# Patient Record
Sex: Male | Born: 1965 | Race: White | Hispanic: Yes | Marital: Single | Smoking: Former smoker
Health system: Southern US, Community
[De-identification: ages and names within clinical notes are randomized; demographics above are authoritative.]

## PROBLEM LIST (undated history)

## (undated) DIAGNOSIS — B351 Tinea unguium: Secondary | ICD-10-CM

## (undated) DIAGNOSIS — G629 Polyneuropathy, unspecified: Secondary | ICD-10-CM

## (undated) DIAGNOSIS — IMO0002 Reserved for concepts with insufficient information to code with codable children: Secondary | ICD-10-CM

## (undated) DIAGNOSIS — E1129 Type 2 diabetes mellitus with other diabetic kidney complication: Secondary | ICD-10-CM

## (undated) DIAGNOSIS — I10 Essential (primary) hypertension: Secondary | ICD-10-CM

## (undated) DIAGNOSIS — E1165 Type 2 diabetes mellitus with hyperglycemia: Secondary | ICD-10-CM

## (undated) DIAGNOSIS — E785 Hyperlipidemia, unspecified: Secondary | ICD-10-CM

## (undated) DIAGNOSIS — E1140 Type 2 diabetes mellitus with diabetic neuropathy, unspecified: Secondary | ICD-10-CM

## (undated) DIAGNOSIS — R809 Proteinuria, unspecified: Secondary | ICD-10-CM

## (undated) HISTORY — DX: Proteinuria, unspecified: R80.9

## (undated) HISTORY — DX: Tinea unguium: B35.1

## (undated) HISTORY — PX: APPENDECTOMY: SHX54

## (undated) HISTORY — DX: Polyneuropathy, unspecified: G62.9

## (undated) HISTORY — DX: Type 2 diabetes mellitus with other diabetic kidney complication: E11.29

## (undated) HISTORY — DX: Type 2 diabetes mellitus with diabetic neuropathy, unspecified: E11.40

## (undated) HISTORY — DX: Type 2 diabetes mellitus with hyperglycemia: E11.65

## (undated) HISTORY — DX: Hyperlipidemia, unspecified: E78.5

## (undated) HISTORY — DX: Essential (primary) hypertension: I10

## (undated) HISTORY — DX: Reserved for concepts with insufficient information to code with codable children: IMO0002

---

## 2002-10-28 ENCOUNTER — Emergency Department (HOSPITAL_COMMUNITY): Admission: EM | Admit: 2002-10-28 | Discharge: 2002-10-28 | Payer: Self-pay | Admitting: Emergency Medicine

## 2002-10-28 ENCOUNTER — Encounter: Payer: Self-pay | Admitting: Emergency Medicine

## 2004-06-13 ENCOUNTER — Encounter: Admission: RE | Admit: 2004-06-13 | Discharge: 2004-06-13 | Payer: Self-pay | Admitting: Gastroenterology

## 2006-08-14 ENCOUNTER — Emergency Department (HOSPITAL_COMMUNITY): Admission: EM | Admit: 2006-08-14 | Discharge: 2006-08-14 | Payer: Self-pay | Admitting: Emergency Medicine

## 2007-05-24 ENCOUNTER — Ambulatory Visit: Payer: Self-pay | Admitting: Family Medicine

## 2007-05-24 ENCOUNTER — Inpatient Hospital Stay (HOSPITAL_COMMUNITY): Admission: EM | Admit: 2007-05-24 | Discharge: 2007-05-30 | Payer: Self-pay | Admitting: Emergency Medicine

## 2010-11-19 NOTE — H&P (Signed)
Willie Jordan, Willie Jordan               ACCOUNT NO.:  [id number]   MEDICAL RECORD NO.:  [id number]          PATIENT TYPE:  INP   LOCATION:  5708                         FACILITY:  [facility]   PHYSICIAN:  Sherrell Puller, MDDATE OF BIRTH:  Feb 06, 1966   DATE OF ADMISSION:  05/24/2007  DATE OF DISCHARGE:                              HISTORY & PHYSICAL   ATTENDING PHYSICIA:  Dr. Talbert Cage   CHIEF COMPLAINT:  Abdominal pain.   HISTORY OF PRESENT ILLNESS:  The patient is a 45 year old male with a  history of uncontrolled diabetes mellitus who presents complaining of 1  day history of abdominal pain.  I saw her last night at approximately  2100.  It is associated with 1 episode of emesis.  The patient was seen  by [facility] this morning and found to have high glucoses and thought  to have acute pancreatitis.  Pain is left-sided and radiates to the  back.  It was 6 out of 10 at its worse.  The patient denies fever,  chills, constipation, diarrhea, dysuria.  The patient does endorse  anorexia as well.  He denies rash, alcohol use, and weight loss.  He IV  pain medications in the emergency room and felt significantly better.  The patient states that he is hungry when he seen and evaluated.   PAST MEDICAL HISTORY:  1. Diabetes mellitus type 2.  2. Questionable hyperlipidemia.   ALLERGIES:  ASPIRIN, which causes pruritus.   MEDICATIONS:  Insulin of unknown type 15 units daily.  The patient used  to take metformin.   FAMILY HISTORY:  Significant for coronary artery disease, hypertension,  diabetes, hyperlipidemia.   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  He does not endorse tobacco abuse of 6 to 7  cigarettes per day, but denies alcohol or drug use.   REVIEW OF SYSTEMS:  As per HPI.  Negative for polydipsia or polyuria.   PHYSICAL EXAMINATION:  VITALS:  Temperature 98.0, blood pressure 113/70,  heart rate 107,  respiratory rate 20, oxygen saturation 97% on room air.  GENERAL:  No apparent distress, awake, alert and oriented.  HEENT:  Dry mucous membranes.  No erythema or exudates in the  oropharynx.  No lymphadenopathy.  No scleral icterus.  Pupils are equal,  round, and reactive to light and accommodation with extraocular  movements intact.  CARDIOVASCULAR:  Tachycardic but with regular rhythm.  No murmurs, rubs,  or gallops.  RESPIRATORY:  Lungs are clear to auscultation bilaterally with normal  work of breathing.  No wheezes, rales, rhonchi.  ABDOMEN:  Soft, tenderness to palpation in the left upper  quadrant/epigastric region as well as, left mid back posterior.  No  rebound or guarding.  Positive bowel sounds.  No organomegaly.  EXTREMITIES:  No clubbing, cyanosis, or edema.  NEURO:  Cranial nerves II-XII grossly intact.  EXTREMITIES:  5/5 in bilateral lower extremities and bilateral upper  extremities.  Sensation intact.  SKIN:  No jaundice.   LABS:  Lipase 150.  Urinalysis - specific  gravity 1.038, greater than  1000 glucose, trace hemoglobin, moderate bilateral, greater than 80  ketones, total protein 100.  Alcohol level less than 5.  White blood  cell count 22.2, hemoglobin 16, platelets 246,000, absolute neutrophil  count 19.1.  AST 72, ALT 37, alk phos 80, total bilirubin 4.6, direct  bilirubin 1.4.  A pH 7.245, pO2 31.6, bicarb 13.7.  Sodium 132,  potassium 4.9, chloride 108, bicarb 36.7, BUN 9, creatinine 0.9, glucose  339.  Please note that the patient's CBC was a lipemic blood sample.   CT scan of the abdomen and pelvis showed acute pancreatitis in the tail  of the pancreas.  No pseudocyst or pancreatic calcifications, mild fatty  liver.  No mass or bowel obstruction.   ASSESSMENT AND PLAN:  The patient is a 45 year old male with acute  pancreatitis, as well as diabetes mellitus.  1. Acute pancreatitis:  Most likely etiology is hypertriglyceridemia,      given the lipemic  blood sample.  However, given the increased      bilirubin, possibly related to cholelithiasis.  The patient denies      alcohol use and has a negative alcohol level here.  So alcoholic      pancreatitis is unlikely.  The patient's vital signs are stable and      his risks and criteria are at most 2, therefore, he is low risk.      There is no fluid or pseudocyst on CT.  We will place the patient      on bowel rest for now with intravenous morphine for pain control.      Will check a fasting lipid panel in the morning, as well as a      comprehensive metabolic panel and hemoglobin A1c.  Will also get a      right upper quadrant ultrasound, in the morning, to evaluate for      gallstones, although there was no comment of gallstones on the CT      scan.  2. Diabetes mellitus type 2:  The patient's sugars have been in the      300s.  Will check a hemoglobin A1c and place on a sliding scale of      insulin for now, as well as 15 units of Lantus subcutaneously      q.h.s.  3. Elevated white blood cell count:  Likely secondary to pancreatitis.      The patient is not febrile, however, his last temperature in the      emergency room was 99.5.  Therefore, we will get blood cultures x2      and monitor.  4. Feeding/nutrition/gastrointestinal:  The patient is n.p.o. for now      and on half normal saline at 100 mL per hour.   DISPOSITION:  Pending clinical improvement and tolerating p.o.      Sherrell Puller, MD  Electronically Signed     TCB/MEDQ  D:  05/25/2007  T:  05/25/2007  Job:  [id number]

## 2010-11-22 NOTE — Discharge Summary (Signed)
Willie Jordan, Willie Jordan               ACCOUNT NO.:  [id number]   MEDICAL RECORD NO.:  [id number]          PATIENT TYPE:  INP   LOCATION:  5708                         FACILITY:  [facility]   PHYSICIAN:  Toomsuba A. Walker Kehr, M.D.    DATE OF BIRTH:  [birth year]   DATE OF ADMISSION:  05/24/2007  DATE OF DISCHARGE:  05/30/2007                               DISCHARGE SUMMARY   PRIMARY CARE Delight Bickle:  Althia Forts (patient goes to [facility]).   REASON FOR ADMISSION:  Abdominal pain.   DISCHARGE DIAGNOSES:  1. Acute pancreatitis secondary to hypertriglyceridemia.  2. Diabetic ketoacidosis.  3. Uncontrolled diabetes mellitus.  4. Hypertriglyceridemia.   DISCHARGE MEDICATIONS:  1. Gemfibrozil 600 mg 1 tab p.o. b.i.d.  2. 70/30 insulin 20 units in the morning and 30 units at night.  3. Metformin 500 mg p.o. b.i.d.   HOSPITAL COURSE:  The patient is a 45 year old Hispanic male who has a  history of uncontrolled diabetes mellitus who presented to the emergency  department with a 1-day history of abdominal pain.  Patient also  complained of emesis.  He had a CT scan of the abdomen and pelvis done  which was consistent with acute pancreatitis without necrosis.  1. Acute pancreatitis:  As mentioned the patient presented with      abdominal pain and had CT scan findings consistent with acute      pancreatitis.  Given a lipemic blood sample, a fasting lipid panel      was obtained which was significant for triglycerides of 526.  This      was likely the etiology of the patient's acute pancreatitis.  He      was initially n.p.o. and given morphine as needed for abdominal      pain.  Patient did have a right upper quadrant ultrasound done      which was negative for cholelithiasis.  Patient initially had an      elevated white count as well as elevated temperature, but these      resolved as did his pain over the several days of his      hospitalization.  Two days prior to discharge, patient did have  fever to 102.7.  However, this was attributed to his resolving      pancreatitis given his negative blood cultures, the normal white      blood cell count and no other obvious source of infection.  Patient      was started on gemfibrozil while in the hospital in order to      control his triglycerides in an effort to prevent further episodes      of pancreatitis.  2. Diabetic ketoacidosis:  On admission the patient was found to have      a significant metabolic acidosis and elevated blood sugars.  He      received vigorous IV fluid resuscitation and initially was started      on an insulin drip.  He had a negative lactic acid level.  His      anion gap eventually closed and patient was transitioned to  subcutaneous insulin.  Although patient's story seems more      consistent with type 2 diabetes, it is possible that his acute      pancreatitis caused him to go into diabetic ketoacidosis.  By the      day of discharge, the patient's blood glucoses, while not optimally      controlled, were less than 300.  Patient was tolerating p.o. well      and able to maintain adequate hydration via p.o. liquids.  Patient      will need continued titration of his diabetes medications in order      to reach optimum management of his diabetes.  3. Hypertriglyceridemia:  As previously mentioned, the patient was      started on gemfibrozil in order to control his triglyceride levels.   CONDITION AT THE TIME OF DISCHARGE:  Stable.   DISCHARGE LABS:  Sodium 138, potassium 3.8, glucose 221, BUN 8,  creatinine 0.65, white blood cell count 5.9, hemoglobin 12.3, platelet  count 194.  LDH 188, C-peptide 1.02, direct LDL 64, hemoglobin A1c 12.1,  triglycerides 526, HDL 30.   DISPOSITION:  The patient was discharged to home.   DISCHARGE FOLLOWUP:  The patient was instructed to follow up at [facility] on Tuesday, November 25.  As it was the weekend, patient was  instructed to call and make that appointment  himself.   FOLLOWUP ISSUES:  1. Poor glycemic control.  2. Hypertriglyceridemia.      Sherrell Puller, MD  Electronically Signed      Arty Baumgartner. Walker Kehr, M.D.  Electronically Signed    TCB/MEDQ  D:  06/01/2007  T:  06/02/2007  Job:  [id number]

## 2011-04-15 LAB — CBC
HCT: 33.7 — ABNORMAL LOW
HCT: 34.5 — ABNORMAL LOW
HCT: 36.5 — ABNORMAL LOW
HCT: 42.5
HCT: 46
Hemoglobin: 12.8 — ABNORMAL LOW
Hemoglobin: 15
Hemoglobin: 16
MCHC: 34.7
MCHC: 34.9
MCHC: 35.1
MCHC: 35.2
MCV: 81.6
MCV: 81.7
MCV: 82.1
MCV: 82.4
Platelets: 138 — ABNORMAL LOW
Platelets: 146 — ABNORMAL LOW
Platelets: 170
Platelets: 171
Platelets: 171
Platelets: 246
RBC: 4.22
RBC: 4.33
RBC: 5.17
RBC: 5.58
RDW: 13.1
RDW: 13.5
RDW: 13.6
RDW: 14.5
WBC: 11.8 — ABNORMAL HIGH
WBC: 15.3 — ABNORMAL HIGH
WBC: 22.2 — ABNORMAL HIGH
WBC: 5.8
WBC: 5.9
WBC: 8

## 2011-04-15 LAB — BASIC METABOLIC PANEL
BUN: 2 — ABNORMAL LOW
BUN: 3 — ABNORMAL LOW
BUN: 5 — ABNORMAL LOW
BUN: 5 — ABNORMAL LOW
BUN: 5 — ABNORMAL LOW
BUN: 5 — ABNORMAL LOW
CO2: 12 — ABNORMAL LOW
CO2: 20
CO2: 21
CO2: 21
CO2: 24
CO2: 30
Calcium: 8.1 — ABNORMAL LOW
Calcium: 8.3 — ABNORMAL LOW
Calcium: 8.4
Calcium: 8.4
Calcium: 8.6
Calcium: 9
Chloride: 102
Chloride: 103
Chloride: 105
Chloride: 105
Chloride: 106
Chloride: 108
Creatinine, Ser: 0.54
Creatinine, Ser: 0.65
Creatinine, Ser: 0.65
Creatinine, Ser: 0.66
Creatinine, Ser: 0.67
Creatinine, Ser: 0.67
Creatinine, Ser: 0.71
GFR calc Af Amer: >60
GFR calc Af Amer: >60
GFR calc Af Amer: >60
GFR calc Af Amer: >60
GFR calc Af Amer: >60
GFR calc Af Amer: >60
GFR calc Af Amer: >60
GFR calc non Af Amer: >60
GFR calc non Af Amer: >60
GFR calc non Af Amer: >60
GFR calc non Af Amer: >60
GFR calc non Af Amer: >60
GFR calc non Af Amer: >60
GFR calc non Af Amer: >60
GFR calc non Af Amer: >60
GFR calc non Af Amer: >60
Glucose, Bld: 115 — ABNORMAL HIGH
Glucose, Bld: 167 — ABNORMAL HIGH
Glucose, Bld: 183 — ABNORMAL HIGH
Glucose, Bld: 197 — ABNORMAL HIGH
Glucose, Bld: 203 — ABNORMAL HIGH
Glucose, Bld: 212 — ABNORMAL HIGH
Glucose, Bld: 222 — ABNORMAL HIGH
Potassium: 3 — ABNORMAL LOW
Potassium: 3.1 — ABNORMAL LOW
Potassium: 3.2 — ABNORMAL LOW
Potassium: 3.2 — ABNORMAL LOW
Potassium: 3.2 — ABNORMAL LOW
Potassium: 3.4 — ABNORMAL LOW
Potassium: 3.6
Potassium: 3.7
Potassium: 3.9
Sodium: 130 — ABNORMAL LOW
Sodium: 131 — ABNORMAL LOW
Sodium: 131 — ABNORMAL LOW
Sodium: 132 — ABNORMAL LOW
Sodium: 132 — ABNORMAL LOW
Sodium: 133 — ABNORMAL LOW
Sodium: 134 — ABNORMAL LOW

## 2011-04-15 LAB — I-STAT 8, (EC8 V) (CONVERTED LAB)
Acid-base deficit: 12 — ABNORMAL HIGH
Acid-base deficit: 14 — ABNORMAL HIGH
BUN: 10
BUN: 9
Bicarbonate: 10.1 — ABNORMAL LOW
Bicarbonate: 13.7 — ABNORMAL LOW
Chloride: 108
Chloride: 108
Glucose, Bld: 339 — ABNORMAL HIGH
Glucose, Bld: 392 — ABNORMAL HIGH
HCT: 46
HCT: 52
Hemoglobin: 15.6
Hemoglobin: 17.7 — ABNORMAL HIGH
Operator id: 151321
Operator id: 151321
Potassium: 4.9
Potassium: 8.2
Sodium: 125 — ABNORMAL LOW
Sodium: 132 — ABNORMAL LOW
TCO2: 11
TCO2: 15
pCO2, Ven: 20.7 — ABNORMAL LOW
pCO2, Ven: 31.6 — ABNORMAL LOW
pH, Ven: 7.245 — ABNORMAL LOW
pH, Ven: 7.295

## 2011-04-15 LAB — LIPID PANEL
Cholesterol: 289 — ABNORMAL HIGH
HDL: 30 — ABNORMAL LOW
LDL Cholesterol: UNDETERMINED
Total CHOL/HDL Ratio: 9.6
Triglycerides: 526 — ABNORMAL HIGH
VLDL: UNDETERMINED

## 2011-04-15 LAB — HEPATIC FUNCTION PANEL
ALT: 37
AST: 72 — ABNORMAL HIGH
Albumin: 3.6
Alkaline Phosphatase: 80
Bilirubin, Direct: 1.4 — ABNORMAL HIGH
Indirect Bilirubin: 3.2 — ABNORMAL HIGH
Total Bilirubin: 4.6 — ABNORMAL HIGH
Total Protein: 7.1

## 2011-04-15 LAB — CULTURE, BLOOD (ROUTINE X 2): Culture: NO GROWTH

## 2011-04-15 LAB — URINE MICROSCOPIC-ADD ON

## 2011-04-15 LAB — COMPREHENSIVE METABOLIC PANEL
ALT: 16
AST: 50 — ABNORMAL HIGH
Albumin: 3.2 — ABNORMAL LOW
Alkaline Phosphatase: 71
BUN: 8
CO2: 9 — CL
Calcium: 8.3 — ABNORMAL LOW
Chloride: 107
Creatinine, Ser: 1.03
GFR calc Af Amer: >60
GFR calc non Af Amer: >60
Glucose, Bld: 231 — ABNORMAL HIGH
Potassium: 5.8 — ABNORMAL HIGH
Sodium: 131 — ABNORMAL LOW
Total Bilirubin: 3.5 — ABNORMAL HIGH
Total Protein: 6.7

## 2011-04-15 LAB — LDL CHOLESTEROL, DIRECT: Direct LDL: 64

## 2011-04-15 LAB — DIFFERENTIAL
Basophils Absolute: 0
Basophils Absolute: 0
Basophils Relative: 0
Basophils Relative: 0
Eosinophils Absolute: 0 — ABNORMAL LOW
Eosinophils Absolute: 0 — ABNORMAL LOW
Eosinophils Relative: 0
Eosinophils Relative: 0
Lymphocytes Relative: 5 — ABNORMAL LOW
Lymphocytes Relative: 8 — ABNORMAL LOW
Lymphs Abs: 0.8
Lymphs Abs: 1.8
Monocytes Absolute: 0.7
Monocytes Absolute: 1.3 — ABNORMAL HIGH
Monocytes Relative: 5
Monocytes Relative: 6
Neutro Abs: 13.7 — ABNORMAL HIGH
Neutro Abs: 19.1 — ABNORMAL HIGH
Neutrophils Relative %: 86 — ABNORMAL HIGH
Neutrophils Relative %: 90 — ABNORMAL HIGH
Smear Review: DECREASED

## 2011-04-15 LAB — LACTATE DEHYDROGENASE
LDH: 188
LDH: 334 — ABNORMAL HIGH

## 2011-04-15 LAB — BLOOD GAS, ARTERIAL
Acid-base deficit: 16.2 — ABNORMAL HIGH
Drawn by: 276051
O2 Saturation: 97.3
pO2, Arterial: 90.5

## 2011-04-15 LAB — PROTIME-INR
INR: 1
Prothrombin Time: 13.4

## 2011-04-15 LAB — ETHANOL: Alcohol, Ethyl (B): <5

## 2011-04-15 LAB — URINALYSIS, ROUTINE W REFLEX MICROSCOPIC
Glucose, UA: >1000 — AB
Ketones, ur: >80 — AB
Leukocytes, UA: NEGATIVE
Leukocytes, UA: NEGATIVE
Nitrite: NEGATIVE
Nitrite: NEGATIVE
Protein, ur: 100 — AB
Specific Gravity, Urine: 1.035 — ABNORMAL HIGH
Specific Gravity, Urine: 1.038 — ABNORMAL HIGH
Urobilinogen, UA: 0.2
pH: 5.5
pH: 7

## 2011-04-15 LAB — POCT I-STAT CREATININE
Creatinine, Ser: 0.9
Operator id: 151321

## 2011-04-15 LAB — HEMOGLOBIN A1C
Hgb A1c MFr Bld: 12.1 — ABNORMAL HIGH
Mean Plasma Glucose: 353

## 2011-04-15 LAB — KETONES, QUALITATIVE

## 2011-04-15 LAB — C-PEPTIDE: C-Peptide: 1.02

## 2011-04-15 LAB — MAGNESIUM: Magnesium: 1.9

## 2011-04-15 LAB — PHOSPHORUS: Phosphorus: 1.6 — ABNORMAL LOW

## 2011-04-15 LAB — LIPASE, BLOOD: Lipase: 150 — ABNORMAL HIGH

## 2014-09-30 DIAGNOSIS — I10 Essential (primary) hypertension: Secondary | ICD-10-CM | POA: Insufficient documentation

## 2014-09-30 DIAGNOSIS — E1165 Type 2 diabetes mellitus with hyperglycemia: Secondary | ICD-10-CM | POA: Insufficient documentation

## 2014-09-30 DIAGNOSIS — E785 Hyperlipidemia, unspecified: Secondary | ICD-10-CM | POA: Insufficient documentation

## 2015-12-26 ENCOUNTER — Ambulatory Visit: Payer: Self-pay | Admitting: Internal Medicine

## 2015-12-31 ENCOUNTER — Ambulatory Visit (INDEPENDENT_AMBULATORY_CARE_PROVIDER_SITE_OTHER): Payer: Self-pay | Admitting: Family Medicine

## 2015-12-31 ENCOUNTER — Encounter: Payer: Self-pay | Admitting: Family Medicine

## 2015-12-31 VITALS — BP 132/72 | HR 82 | Temp 98.1°F | Resp 18 | Ht 65.0 in | Wt 154.0 lb

## 2015-12-31 DIAGNOSIS — R809 Proteinuria, unspecified: Secondary | ICD-10-CM

## 2015-12-31 DIAGNOSIS — Z91199 Patient's noncompliance with other medical treatment and regimen due to unspecified reason: Secondary | ICD-10-CM

## 2015-12-31 DIAGNOSIS — R69 Illness, unspecified: Secondary | ICD-10-CM

## 2015-12-31 DIAGNOSIS — E1165 Type 2 diabetes mellitus with hyperglycemia: Secondary | ICD-10-CM

## 2015-12-31 DIAGNOSIS — E1140 Type 2 diabetes mellitus with diabetic neuropathy, unspecified: Secondary | ICD-10-CM

## 2015-12-31 DIAGNOSIS — Z9119 Patient's noncompliance with other medical treatment and regimen: Secondary | ICD-10-CM

## 2015-12-31 DIAGNOSIS — Z794 Long term (current) use of insulin: Secondary | ICD-10-CM

## 2015-12-31 DIAGNOSIS — I10 Essential (primary) hypertension: Secondary | ICD-10-CM

## 2015-12-31 LAB — POCT URINALYSIS DIPSTICK
Bilirubin, UA: NEGATIVE
Blood, UA: NEGATIVE
GLUCOSE UA: 500
Ketones, UA: NEGATIVE
LEUKOCYTES UA: NEGATIVE
NITRITE UA: NEGATIVE
Spec Grav, UA: 1.015
UROBILINOGEN UA: 0.2
pH, UA: 5

## 2015-12-31 LAB — GLUCOSE, POCT (MANUAL RESULT ENTRY): POC Glucose: 468 mg/dl — AB (ref 70–99)

## 2015-12-31 MED ORDER — INSULIN ASPART PROT & ASPART (70-30 MIX) 100 UNIT/ML PEN: 10.0000 [IU] | PEN_INJECTOR | Freq: Every day | SUBCUTANEOUS | Status: DC

## 2015-12-31 MED ORDER — METFORMIN HCL 1000 MG PO TABS: 1000.0000 mg | ORAL_TABLET | Freq: Two times a day (BID) | ORAL | Status: DC

## 2015-12-31 MED ORDER — LISINOPRIL 2.5 MG PO TABS: 2.5000 mg | ORAL_TABLET | Freq: Every day | ORAL | Status: DC

## 2015-12-31 MED ORDER — GABAPENTIN 100 MG PO CAPS: 100.0000 mg | ORAL_CAPSULE | Freq: Every day | ORAL | Status: DC

## 2015-12-31 MED ORDER — HYDROCHLOROTHIAZIDE 25 MG PO TABS: 25.0000 mg | ORAL_TABLET | Freq: Every day | ORAL | Status: DC

## 2015-12-31 NOTE — Progress Notes (Addendum)
[facility]   Chief Complaint:  Chief Complaint  Patient presents with  . Diabetes    HPI: Willie Jordan is a 50 y.o. male who reports to [facility] today complaining of:  1. T2 DM uncontrolled-dx 10 years ago, he does not measure his sugars at home.  + neuropathy but denies pain in legs, primarily at night but has it throughout the day, he has had neuropathy sxs  for last 2 years, no prior meds except has tried marijuana ( which his friend gave him)  and alcohol home remedy rub and has had some minimal relief.  He denies  CP, SOB, hypoglycemia, palpitations, presyncope/syncope, polydipsia, polyuria, nausea, vomiting, abd pain. He drinks regular juice,denies drinking soda, drinks 2 regular gatorade a day, he exercises sometimes, he works in [employer] and sometimes he gets "tired" but he does not check his sugars. He drinks beer 1-2 times a week. He does not monitor his diet very well. Again not having any n/v/abd pain, polydipsia or polyuria. Eating and drinking normally.   He is noncompliant with meds. He takes his morning medications regular but sometimes forget his 2nd dose of metformin. He forgets to take his second dose about 4 times a week. He has been without his insulin for 2 weeks, he ran out, was working in [place]. Does not wrive so difficult to get meds when he is at home in [place] or when he is out of town working.   2. HTN-does not measure at home. Takes  His meds , denies CP or dizziness, throat swelling or edema.  3. Hyperlipidemia-he has a hx of minimally elevated Total cholesterol and elevated TG, not on meds.      Past Medical History  Diagnosis Date  . Type 2 diabetes mellitus, uncontrolled ([place])   . Hypertension   . Neuropathy ([place])   . Hyperlipidemia    Past Surgical History  Procedure Laterality Date  . Appendectomy     Social History   Social History  . Marital Status: Married    Spouse Name: N/A  . Number of Children: N/A  . Years  of Education: N/A   Social History Main Topics  . Smoking status: Current Some Day Smoker    Types: Cigarettes    Start date: 10/31/2015  . Smokeless tobacco: Never Used  . Alcohol Use: 0.0 oz/week    0 Standard drinks or equivalent per week     Comment: rarely  . Drug Use: No  . Sexual Activity: Not Currently   Other Topics Concern  . None   Social History Narrative  . None   Family History  Problem Relation Age of Onset  . Diabetes Mother   . Diabetes Sister   . Diabetes Brother   . Cancer Maternal Grandmother    Allergies  Allergen Reactions  . Aspirin Rash   Prior to Admission medications   Not on File     ROS: The patient denies fevers, chills, night sweats, unintentional weight loss, chest pain, palpitations, wheezing, dyspnea on exertion, nausea, vomiting, abdominal pain, dysuria, hematuria, melena, numbness, weakness, or tingling. Denies any recent skin infections or balanitis.   All other systems have been reviewed and were otherwise negative with the exception of those mentioned in the HPI and as above.    PHYSICAL EXAM: Filed Vitals:   12/31/15 1144  BP: 132/72  Pulse: 82  Temp: 98.1 F (36.7 C)  Resp: 18   Body mass index is 25.63 kg/(m^2).  General: Alert, no acute distress, Hispanic male HEENT:  Normocephalic, atraumatic, oropharynx patent. EOMI, PERRLA. Gross fundo exam normal  Cardiovascular:  Regular rate and rhythm, no rubs murmurs or gallops.  No Carotid bruits, radial pulse intact. No pedal edema.  Respiratory: Clear to auscultation bilaterally.  No wheezes, rales, or rhonchi.  No cyanosis, no use of accessory musculature Abdominal: No organomegaly, abdomen is soft and non-tender, positive bowel sounds. No masses. Skin: No rashes.He has +microfilament exam,  + toe nail onychomycosis  Neurologic: Facial musculature symmetric. Psychiatric: Patient acts appropriately throughout our interaction. Lymphatic: No cervical or submandibular  lymphadenopathy Musculoskeletal: Gait intact.    LABS: Results for orders placed or performed in visit on 12/31/15  POCT Glucose (CBG)-Manual entry (CPT [phone])  Result Value Ref Range   POC Glucose 468 (A) 70 - 99 mg/dl   Urine dip: + proteinuria + glucose 500. No leuks/nitrites/blood Urine micro: negative for yeast, + epithelial cells, neg rbcs, unable to appreciate casts    EKG/XRAY:     ASSESSMENT/PLAN: Encounter Diagnoses  Name Primary?  Marland Kitchen Uncontrolled type 2 diabetes mellitus with hyperglycemia, with long-term current use of insulin ([place]) Yes  . Essential hypertension   . Type 2 diabetes mellitus with diabetic neuropathy, with long-term current use of insulin ([place])   . Taking medication for chronic disease   . Noncompliance    [name] is a pleasant 50 y/o hispanic male with a PMH of uncontrolled T2DM with neuropathy  secondary to noncompliance and HTN.   1. T2DM-continue with current meds ( Metformin 1 g BID, Novolog 10 units [place] daily, and Lisinopril 2.5 mg daily) . Advise to take meds as prescribed, he is not taking PM dose of metformin, missing 4/7 days of meds in the PM. Noncompliant with insulin. Will restart meds and ask him to be complaint and return in 1 month for re-evaluation with blood sugar logs. In the meantime he will get his eyes checked at [facility] for any diabetic related changes since he has no insurance and may/maynot be in the process of getting an orange card. He was given the DM standard of care in Spanish to review. Advise to cut out gatorade drinks and watch carbohydrate intake.   2. Neuropathy-Will try a trial of Gabapentin 100 mg and see how he does, he would like to stay at a low dose and not titrate up until next visit.   3. He is not sure about his immunization status. Will check.   4. HTN-continue with medications, HCTZ 25 mg daily, Lisinopril 2.5 mg daily.   5. Medication refills x 6 months for chronic meds and 1 month for new  medication (gabapentin)   6. Fu in 1 month with fasting blood sugar logs. Labs Pending from today: HbA1C, CMP, urine microalbumin. Consider fasting blood work on next visit due to history of Hyperlipidemia in 2008 ( TG was 528, Total was 238 and LDL was 64 ) but not emergent.   Gross sideeffects, risk and benefits, and alternatives of medications d/w patient. Patient is aware that all medications have potential sideeffects and we are unable to predict every sideeffect or drug-drug interaction that may occur.  Hazelene Doten DO  12/31/2015 1:01 PM

## 2015-12-31 NOTE — Patient Instructions (Addendum)
Diabetes y normas bsicas de atencin mdica (Diabetes and Standards of Medical Care) La diabetes es una enfermedad complicada. El equipo que trate su diabetes deber incluir un nutricionista, un enfermero, un educador para la diabetes, un oftalmlogo y ms. Para que todas las personas conozcan sobre su enfermedad y para que los pacientes tengan los cuidados que necesitan, se crearon las siguientes normas bsicas para un mejor control. A continuacin se indican los estudios, vacunas, medicamentos, educacin y planes que necesitar. Prueba de HbA1c Esta prueba muestra cmo ha sido controlada su glucosa en los ltimos 2 o 3 meses. Se utiliza para verificar si el plan de control de la diabetes debe ser ajustado.   Hgalos al menos 2 veces al ao si cumple los objetivos del tratamiento.  Si Willie Jordan han cambiado el tratamiento o si no cumple con los objetivos del tratamiento, debe hacerlo 4 veces al ao. Control de la presin arterial.  Hgalas en cada visita mdica de rutina. El objetivo es tener menos de 140/90 mm Hg en la mayora de las personas, pero 130/80 mm Hg en algunos casos. Consulte a su mdico acerca de su objetivo. Examen dental.  Concurra regularmente a las visitas de control con el dentista. Examen ocular.  Si Willie Jordan diagnosticaron diabetes tipo 1 siendo un nio, debe hacerse estudios al llegar a los 10 aos o ms y si ha sufrido de diabetes durante 3 a 5 aos. Se recomienda hacer anualmente los exmenes oculares despus de ese examen inicial.  Si Willie Jordan diagnosticaron diabetes tipo 1 siendo adulto, hgase un examen dentro de los 5 aos del diagnstico y luego una vez por ao.  Si Willie Jordan diagnosticaron diabetes tipo 2, hgase un estudio lo antes posible despus del diagnstico y luego una vez por ao. Examen de los pies  Se har una inspeccin visual en cada visita mdica de rutina. Estos controles observarn si hay cortes, lesiones u otros problemas en los pies.  Debe realizarse un examen  completo de los pies cada ao. Esto incluye revisar la estructura y la piel de los pies, y examinar los pulsos y la sensacin de los pies.  Diabetes tipo 1: La primera prueba se realiza 5 aos despus del diagnstico.  Diabetes tipo 2: La primera prueba se realiza en el momento del diagnstico.  Contrlese los pies todas las noches para ver si hay cortes, lesiones u otros problemas. Comunquese con su mdico si observa que no se curan. Estudio de la funcin renal (microalbmina en orina)  Debe realizarse una vez por ao.  Diabetes tipo 1: La primera prueba se realiza a los 5 aos despus del diagnstico.  Diabetes tipo2: La primera prueba se realiza en el momento del diagnstico.  La creatinina srica y el ndice de filtracin glomerular estimada (eGFR, por sus siglas en ingls) se realizan una vez por ao para informar el nivel de enfermedad renal crnica, si la hubiera. Perfil lipdico (colesterol, HDL, LDL, triglicridos).  La mayora de las personas lo hacen cada 5 aos.  En relacin al LDL, el objetivo es tener menos de 100 mg/dl. Si tiene alto riesgo, el objetivo es tener menos de 70 mg/dl.  En relacin al HDL, el objetivo es tener entre 40 y 50 mg/dl para los hombres y entre 50 y 60 mg/dl para las mujeres. Un nivel de colesterol HDL de 60 mg/dl o superior da una cierta proteccin contra la enfermedad cardaca.  En relacin a los triglicridos, el objetivo es tener menos de 150 mg/dl. Vacunas  Se recomienda   aplicar de forma anual la vacuna contra la gripe a todas las personas de 6 meses en adelante que tengan diabetes.  La vacuna contra la neumona (antineumoccica) est recomendada para todas las personas de 2 aos en adelante que tengan diabetes. Los adultos de 65 aos o ms pueden recibir la vacuna antineumoccica como una serie de dos inyecciones diferentes.  Se recomienda administrar la vacuna contra la hepatitis B en adultos poco despus de que hayan recibido el  diagnstico de diabetes.  La vacuna Tdap (contra el ttanos, la difteria y la tosferina) debe aplicarse de la siguiente manera:  Segn las pautas normales de vacunacin infantil en el caso de los nios.  Cada 10 aos en el caso de los adultos con diabetes. Educacin para el autocontrol de la diabetes  Recomendaciones al momento del diagnstico y los controles segn sea necesario. Plan de tratamiento  Su plan de tratamiento ser revisado en cada visita mdica.   Esta informacin no tiene como fin reemplazar el consejo del mdico. Asegrese de hacerle al mdico cualquier pregunta que tenga.   Document Released: 09/17/2009 Document Revised: 07/14/2014 Elsevier Interactive Patient Education 2016 Elsevier Inc.  Gabapentin capsules or tablets Qu es este medicamento? La GABAPENTINA se utiliza para controlar las crisis parciales en adultos con epilepsia. Este medicamento tambin se utiliza para tratar ciertos tipos de dolores crnicos relacionados con los nervios. Este medicamento puede ser utilizado para otros usos; si tiene alguna pregunta consulte con su proveedor de atencin mdica o con su farmacutico. Qu le debo informar a mi profesional de la salud antes de tomar este medicamento? Necesita saber si usted presenta alguno de los siguientes problemas o situaciones: -enfermedad renal -ideas suicidas, planes o si usted o alguien de su familia ha intentado un suicidio previo -una reaccin alrgica o inusual a la gabapentina, otros medicamentos, alimentos, colorantes o conservadores -si est embarazada o buscando quedar embarazada -si est amamantando a un beb Cmo debo utilizar este medicamento? Tome este medicamento por va oral con un vaso de agua. Siga las instrucciones de la etiqueta del medicamento. Puede tomarlo con o sin alimentos. Si este medicamento le produce malestar estomacal, tmelo con alimentos. Tome su medicamento a intervalos regulares. No tome su medicamento con una  frecuencia mayor a la indicada. No deje de tomarlo excepto si as lo indica su mdico. Si recibe instrucciones de partir las tabletas de 600 o de 800 mg por la mitad como parte de su dosis, debe tomar la mitad de la tableta adicional en su prxima dosis. Si no ha utilizado la mitad de la tableta adicional despus de 28 das, debe desecharla. Su farmacutico le dar una Gua del medicamento especial con cada receta y relleno. Asegrese de leer esta informacin cada vez cuidadosamente. Hable con su pediatra para informarse acerca del uso de este medicamento en nios. Puede requerir atencin especial. Sobredosis: Pngase en contacto inmediatamente con un centro toxicolgico o una sala de urgencia si usted cree que haya tomado demasiado medicamento. ATENCIN: Este medicamento es solo para usted. No comparta este medicamento con nadie. Qu sucede si me olvido de una dosis? Si olvida una dosis, tmela lo antes posible. Si es casi la hora de la prxima dosis, tome slo esa dosis. No tome dosis adicionales o dobles. Qu puede interactuar con este medicamento? No tome esta medicina con ninguno de los siguientes medicamentos: -otros productos de gabapentina Esta medicina tambin puede interactuar con los siguientes medicamentos: -alcohol -anticidos -antihistamnicos para alergias, tos y resfros -ciertos medicamentos para la   ansiedad o para dormir -ciertos medicamentos para la depresin o trastornos psicticos -homatropina; hidrocodona -naproxeno -medicamentos narcticos(opiceos)para el dolor -fenotiazinas, tales como clorpromacina, mesoridazina, proclorperazina, tioridazina Puede ser que esta lista no menciona todas las posibles interacciones. Informe a su profesional de la salud de todos los productos a base de hierbas, medicamentos de venta libre o suplementos nutritivos que est tomando. Si usted fuma, consume bebidas alcohlicas o si utiliza drogas ilegales, indqueselo tambin a su profesional  de la salud. Algunas sustancias pueden interactuar con su medicamento. A qu debo estar atento al usar este medicamento? Visite a su mdico o a su profesional de la salud para chequear su evolucin peridicamente. Tal vez desee mantener un registro personal en su hogar de cmo siente que su enfermedad responde al tratamiento. Puede compartir esta informacin con su mdico o con su profesional de la salud durante cada consulta. Debe comunicarse con su mdico o con su profesional de la salud si sus convulsiones empeoran o si experimenta un nuevo tipo de convulsiones. No deje de tomar este medicamento ni ninguno de sus medicamentos para las convulsiones a menos que as lo indique su mdico o su profesional de la salud. Si suspende repentinamente su medicamento, el nmero de convulsiones o su gravedad puede aumentar. Use una pulsera o cadena de identificacin mdica si toma este medicamento para tratar sus convulsiones. Lleve consigo una tarjeta de identificacin que indique todos sus medicamentos. Puede experimentar somnolencia, mareos o visin borrosa. No conduzca ni utilice maquinaria, ni haga nada que le exija permanecer en estado de alerta hasta que sepa cmo le afecta este medicamento. Para reducir los mareos o los desmayos, no se siente ni se ponga de pie con rapidez, especialmente si es un paciente de edad avanzada. El alcohol puede aumentar la somnolencia y los mareos. Evite consumir bebidas alcohlicas. Se le podr secar la boca. Masticar chicle si azcar, chupar caramelos duros y beber agua en abundancia le ayudar a mantener la boca hmeda. El uso de este medicamento puede aumentar la posibilidad de tener ideas o comportamiento suicida. Presta atencin a como usted responde al medicamento mientras est usndolo. Informe a su profesional de la salud inmediatamente de cualquier empeoramiento de humor o ideas de suicidio o de morir. Las mujeres que se encuentran embarazadas mientras usan este  medicamento pueden inscribirse en el registro del [place] American Antiepileptic Drug Pregnancy Registry (Registro estadounidense de Embarazo de Medicamentos Antiepilpticos) llamando al telfono [phone]. Este registro recoge informacin acerca de la seguridad del uso de medicamentos antiepilpticos durante el embarazo. Qu efectos secundarios puedo tener al utilizar este medicamento? Efectos secundarios que debe informar a su mdico o a su profesional de la salud tan pronto como sea posible: -reacciones alrgicas como erupcin cutnea, picazn o urticarias, hinchazn de la cara, labios o lengua -empeoramiento de humor o ideas o actos de suicidio o de morir Efectos secundarios que, por lo general, no requieren atencin mdica (debe informarlos a su mdico o a su profesional de la salud si persisten o si son molestos): -estreimiento -dificultad para caminar o controlar los movimientos musculares -mareos -nuseas -habla arrastrando las palabras -cansancio -temblores -aumento de peso Puede ser que esta lista no menciona todos los posibles efectos secundarios. Comunquese a su mdico por asesoramiento mdico sobre los efectos secundarios. Usted puede informar los efectos secundarios a la FDA por telfono al 1-800-FDA-1088. Dnde debo guardar mi medicina? Mantngala fuera del alcance de los nios. Gurdela a temperatura ambiente, entre 15 y 30 grados C (59 y 86 grados F).   Deseche todo el medicamento que no haya utilizado, despus de la fecha de vencimiento. ATENCIN: Este folleto es un resumen. Puede ser que no cubra toda la posible informacin. Si usted tiene preguntas acerca de esta medicina, consulte con su mdico, su farmacutico o su profesional de Technical sales engineer.    2016, Elsevier/Gold Standard. (2014-08-15 00:00:00)

## 2016-01-01 LAB — COMPREHENSIVE METABOLIC PANEL
AST: 12 IU/L (ref 0–40)
Alkaline Phosphatase: 134 IU/L — ABNORMAL HIGH (ref 39–117)
BUN/Creatinine Ratio: 29 — ABNORMAL HIGH (ref 9–20)
CO2: 23 mmol/L (ref 18–29)
Calcium: 10.1 mg/dL (ref 8.7–10.2)
Creatinine, Ser: 0.82 mg/dL (ref 0.76–1.27)
GFR calc non Af Amer: 105 mL/min/{1.73_m2} (ref >59)
Potassium: 4.3 mmol/L (ref 3.5–5.2)
Sodium: 134 mmol/L (ref 134–144)

## 2016-01-01 LAB — COMPREHENSIVE METABOLIC PANEL WITH GFR
ALT: 17 IU/L (ref 0–44)
Albumin/Globulin Ratio: 1.3 (ref 1.2–2.2)
Albumin: 4.3 g/dL (ref 3.5–5.5)
BUN: 24 mg/dL (ref 6–24)
Bilirubin Total: 0.4 mg/dL (ref 0.0–1.2)
Chloride: 91 mmol/L — ABNORMAL LOW (ref 96–106)
GFR calc Af Amer: 122 mL/min/{1.73_m2} (ref >59)
Globulin, Total: 3.3 g/dL (ref 1.5–4.5)
Glucose: 414 mg/dL — ABNORMAL HIGH (ref 65–99)
Total Protein: 7.6 g/dL (ref 6.0–8.5)

## 2016-01-01 LAB — MICROALBUMIN, URINE: Microalbumin, Urine: 582.8 ug/mL

## 2016-01-01 LAB — HEMOGLOBIN A1C
Est. average glucose Bld gHb Est-mCnc: 306 mg/dL
Hgb A1c MFr Bld: 12.3 % — ABNORMAL HIGH (ref 4.8–5.6)

## 2016-01-02 ENCOUNTER — Telehealth: Payer: Self-pay | Admitting: Family Medicine

## 2016-01-02 NOTE — Telephone Encounter (Signed)
Called, unable to LM re:  labs

## 2016-02-04 ENCOUNTER — Ambulatory Visit (INDEPENDENT_AMBULATORY_CARE_PROVIDER_SITE_OTHER): Payer: Self-pay | Admitting: Internal Medicine

## 2016-02-04 ENCOUNTER — Encounter: Payer: Self-pay | Admitting: Internal Medicine

## 2016-02-04 VITALS — BP 150/80 | HR 80 | Resp 18 | Ht 66.0 in | Wt 160.0 lb

## 2016-02-04 DIAGNOSIS — Z23 Encounter for immunization: Secondary | ICD-10-CM

## 2016-02-04 DIAGNOSIS — Z794 Long term (current) use of insulin: Secondary | ICD-10-CM

## 2016-02-04 DIAGNOSIS — I10 Essential (primary) hypertension: Secondary | ICD-10-CM

## 2016-02-04 DIAGNOSIS — E1165 Type 2 diabetes mellitus with hyperglycemia: Secondary | ICD-10-CM

## 2016-02-04 LAB — GLUCOSE, POCT (MANUAL RESULT ENTRY): POC GLUCOSE: 472 mg/dL — AB (ref 70–99)

## 2016-02-04 MED ORDER — GABAPENTIN 100 MG PO CAPS: ORAL_CAPSULE | ORAL | 6 refills | Status: DC

## 2016-02-04 NOTE — Patient Instructions (Signed)
Keep track of your sugars in a log book and bring them in to your visits Bring all your meds into your visit

## 2016-02-04 NOTE — Progress Notes (Signed)
   Subjective:    Patient ID: Willie Jordan, male    DOB: 01-12-1966, 50 y.o.   MRN: [id number]  HPI   Pt. Returns after a hiatus of 1 year.  Ran out of refills so came in 1 month ago and saw Dr. Marin Comment.  1.  Peripheral Neuropathy:  Just in feet at bedtime.  Improved with Gabapentin.  Last week increased to 200 mg at bedtime with minimal improvement.   Does not have his orange card.  States he doesn't understand what papers he needs.  2.  DM:  Utilizing Novolog 70/30 unit/ml 10 units twice daily.  Switched to this about 5 months ago as was getting care in [place], [place].  In [employer].   Was missing evening dose 4/7 days.  Still forgetting this about the same number of times per week. Taking his evening insulin at bedtime, not before his evening meal Has not been checking his sugars. States it's all too hard.  3.  Essential Hypertension:  States he never misses his blood pressure meds--always takes the meds in the morning.   Current Outpatient Prescriptions:  .  gabapentin (NEURONTIN) 100 MG capsule, Take 1 capsule (100 mg total) by mouth at bedtime. For leg numbness/tingling, Disp: 30 capsule, Rfl: 0 .  hydrochlorothiazide (HYDRODIURIL) 25 MG tablet, Take 1 tablet (25 mg total) by mouth daily with breakfast., Disp: 30 tablet, Rfl: 5 .  insulin aspart protamine - aspart (NOVOLOG 70/30 MIX) (70-30) 100 UNIT/ML FlexPen, Inject 0.1 mLs (10 Units total) into the skin daily with breakfast., Disp: 15 mL, Rfl: 5 .  lisinopril (PRINIVIL,ZESTRIL) 2.5 MG tablet, Take 1 tablet (2.5 mg total) by mouth daily., Disp: 30 tablet, Rfl: 5 .  metFORMIN (GLUCOPHAGE) 1000 MG tablet, Take 1 tablet (1,000 mg total) by mouth 2 (two) times daily with a meal., Disp: 60 tablet, Rfl: 5 .  Multiple Vitamins-Minerals (MULTIVITAMIN ADULT PO), Take 1 capsule by mouth daily., Disp: , Rfl:    Allergies  Allergen Reactions  . Aspirin Rash      Review of Systems     Objective:   Physical Exam NAD Appears  tired Lungs:  CTA CV:  RRR without murmur or rub, radial and DP pulses normal and equal LE:  No edema       Assessment & Plan:  1.  DM 2:  Not motivated.  Referral to Macie Burows , LCSW to see if can ascertain as to why.  Suspect mainly in denial. Discussed simple meals he can have in his fridge to eat at home. Will look into getting him more specific nutrition/diabetic counseling Discussed taking insulin about 15-20 minutes before morning and evening meals. Switch to Metformin ER and titrate up to 2000 mg once in the morning. Pneumovax 23 v today. No Tdap available today  2. Essential Hypertension:  Not as well controlled.  Suspect noncompliance.  BP was fine last month  3.  Peripheral Neuropathy:  Increase Gabapentin to 300 mg at bedtime.  No labs as not fasting and not taking meds so would not expect improvement.

## 2016-02-06 ENCOUNTER — Telehealth: Payer: Self-pay | Admitting: Internal Medicine

## 2016-02-06 NOTE — Telephone Encounter (Signed)
Patient called and needs Dr. To call in refill of her Rx metFORMIN 500 mg. Patient can be reached at [phone].

## 2016-02-06 NOTE — Telephone Encounter (Signed)
Patient informed rx sent.

## 2016-02-07 MED ORDER — METFORMIN HCL ER 500 MG PO TB24: ORAL_TABLET | ORAL | 11 refills | Status: DC

## 2016-02-11 ENCOUNTER — Other Ambulatory Visit: Payer: Self-pay | Admitting: Licensed Clinical Social Worker

## 2016-03-22 DIAGNOSIS — Z7984 Long term (current) use of oral hypoglycemic drugs: Secondary | ICD-10-CM | POA: Insufficient documentation

## 2016-03-22 DIAGNOSIS — E1165 Type 2 diabetes mellitus with hyperglycemia: Secondary | ICD-10-CM | POA: Insufficient documentation

## 2016-03-22 DIAGNOSIS — N481 Balanitis: Secondary | ICD-10-CM | POA: Insufficient documentation

## 2016-03-22 DIAGNOSIS — E1140 Type 2 diabetes mellitus with diabetic neuropathy, unspecified: Secondary | ICD-10-CM | POA: Insufficient documentation

## 2016-03-22 DIAGNOSIS — Z79899 Other long term (current) drug therapy: Secondary | ICD-10-CM | POA: Insufficient documentation

## 2016-03-22 DIAGNOSIS — Z794 Long term (current) use of insulin: Secondary | ICD-10-CM | POA: Insufficient documentation

## 2016-03-22 DIAGNOSIS — Z87891 Personal history of nicotine dependence: Secondary | ICD-10-CM | POA: Insufficient documentation

## 2016-03-22 DIAGNOSIS — I10 Essential (primary) hypertension: Secondary | ICD-10-CM | POA: Insufficient documentation

## 2016-03-23 ENCOUNTER — Emergency Department (HOSPITAL_COMMUNITY)
Admission: EM | Admit: 2016-03-23 | Discharge: 2016-03-23 | Disposition: A | Discharge status: Home / Self Care | Payer: Self-pay | Attending: Emergency Medicine | Admitting: Emergency Medicine

## 2016-03-23 ENCOUNTER — Encounter (HOSPITAL_COMMUNITY): Payer: Self-pay | Admitting: *Deleted

## 2016-03-23 DIAGNOSIS — R739 Hyperglycemia, unspecified: Secondary | ICD-10-CM

## 2016-03-23 DIAGNOSIS — N481 Balanitis: Secondary | ICD-10-CM

## 2016-03-23 LAB — CBG MONITORING, ED: GLUCOSE-CAPILLARY: 476 mg/dL — AB (ref 65–99)

## 2016-03-23 MED ORDER — CLOTRIMAZOLE 1 % EX CREA: TOPICAL_CREAM | CUTANEOUS | 0 refills | Status: DC

## 2016-03-23 MED ORDER — INSULIN ASPART 100 UNIT/ML ~~LOC~~ SOLN
5.0000 [IU] | Freq: Once | SUBCUTANEOUS | Status: AC
  Administered 2016-03-23: 5 [IU] via SUBCUTANEOUS
  Filled 2016-03-23: qty 1

## 2016-03-23 MED ORDER — TRAMADOL HCL 50 MG PO TABS
50.0000 mg | ORAL_TABLET | Freq: Once | ORAL | Status: AC
  Administered 2016-03-23: 50 mg via ORAL
  Filled 2016-03-23: qty 1

## 2016-03-23 MED ORDER — ACETAMINOPHEN 325 MG PO TABS
650.0000 mg | ORAL_TABLET | Freq: Once | ORAL | Status: AC
  Administered 2016-03-23: 650 mg via ORAL
  Filled 2016-03-23: qty 2

## 2016-03-23 MED ORDER — OXYCODONE-ACETAMINOPHEN 5-325 MG PO TABS: 1.0000 | ORAL_TABLET | ORAL | 0 refills | Status: DC | PRN

## 2016-03-23 NOTE — ED Notes (Addendum)
After talking with MD pt continues to be upset. Pt talking to RN using curse words. The interpreter was used for translating during this RN entire interaction with the pt. Pt upset and feels as if he should not go home. Pt given adequate pain medication. Pt states he does not want pain medication. Pt informed this RN that he is unable to urinate. RN informed MD and MD came and talked to the pt and gave the verbal order to bladder scan him and if necessary we would drain his bladder. Pt also informed that his discharge instructions are in spanish and this RN is unable to read them. RN asked that the pt review his paper work and that the RN would be right back with the necessary equipment. Pt refused to wait and states that he is leaving. Refuses to sign signature pad for this RN. Pt took his discharge instructions and left.

## 2016-03-23 NOTE — ED Triage Notes (Signed)
The pt reports that he has penis pain with some bleeding from that area just a little while ago

## 2016-03-23 NOTE — ED Provider Notes (Signed)
[facility] DEPT Provider Note   CSN: [id number] Arrival date & time: 03/22/16  2355  By signing my name below, I, Reola Mosher, attest that this documentation has been prepared under the direction and in the presence of Delora Fuel, MD. Electronically Signed: Reola Mosher, ED Scribe. 03/23/16. 2:07 AM.  History   Chief Complaint Chief Complaint  Patient presents with  . Groin Swelling   The history is provided by the patient. A language interpreter was used ([place]).   HPI Comments: Willie Jordan is a 50 y.o. male with a PMHx of DM2, HLD, and HTN, who presents to the Emergency Department complaining of gradual onset penile pain x ~2 days. Pt reports that two days ago he tried to pull the foreskin back from the tip of his penis and after it would not return. He notes that since then he has had redness, intermittent bleeding, and mild edema to the area since onset. His pain is exacerbated with movement of his foreskin. No treatments were tried prior to coming into the ED. No other associated symptoms or complaints.   Past Medical History:  Diagnosis Date  . Hyperlipidemia   . Hypertension   . Neuropathy ([place])   . Type 2 diabetes mellitus, uncontrolled ([place])    Patient Active Problem List   Diagnosis Date Noted  . HLD (hyperlipidemia) 09/30/2014  . BP (high blood pressure) 09/30/2014  . Type 2 diabetes mellitus with hyperglycemia ([place]) 09/30/2014   Past Surgical History:  Procedure Laterality Date  . APPENDECTOMY      Home Medications    Prior to Admission medications   Medication Sig Start Date End Date Taking? Authorizing Provider  gabapentin (NEURONTIN) 100 MG capsule 3 caps by mouth at bedtime 02/04/16   Mack Hook, MD  hydrochlorothiazide (HYDRODIURIL) 25 MG tablet Take 1 tablet (25 mg total) by mouth daily with breakfast. 12/31/15   Thao P Le, DO  insulin aspart protamine - aspart (NOVOLOG 70/30 MIX) (70-30) 100 UNIT/ML FlexPen Inject 0.1 mLs  (10 Units total) into the skin daily with breakfast. 12/31/15   Thao P Le, DO  lisinopril (PRINIVIL,ZESTRIL) 2.5 MG tablet Take 1 tablet (2.5 mg total) by mouth daily. 12/31/15   Thao P Le, DO  metFORMIN (GLUCOPHAGE-XR) 500 MG 24 hr tablet 2 tabs by mouth with breakfast daily 02/07/16   Mack Hook, MD  Multiple Vitamins-Minerals (MULTIVITAMIN ADULT PO) Take 1 capsule by mouth daily.    Historical Provider, MD   Family History Family History  Problem Relation Age of Onset  . Diabetes Mother   . Diabetes Sister   . Diabetes Brother   . Cancer Maternal Grandmother    Social History Social History  Substance Use Topics  . Smoking status: Former Smoker    Types: Cigarettes    Start date: 10/31/2015    Quit date: 01/05/2016  . Smokeless tobacco: Never Used  . Alcohol use No     Comment: rarely   Allergies   Aspirin  Review of Systems Review of Systems  Constitutional: Negative for fever.  Genitourinary: Positive for penile pain and penile swelling.  Skin: Positive for color change.  All other systems reviewed and are negative.  Physical Exam Updated Vital Signs BP 191/97 (BP Location: Left Arm)   Pulse 103   Temp 98.2 F (36.8 C) (Oral)   Resp 22   Wt 168 lb (76.2 kg)   SpO2 97%   BMI 27.12 kg/m   Physical Exam  Constitutional: He is oriented  to person, place, and time. He appears well-developed and well-nourished.  HENT:  Head: Normocephalic and atraumatic.  Eyes: EOM are normal. Pupils are equal, round, and reactive to light.  Neck: Normal range of motion. Neck supple. No JVD present.  Cardiovascular: Normal rate, regular rhythm and normal heart sounds.   No murmur heard. Pulmonary/Chest: Effort normal and breath sounds normal. He has no wheezes. He has no rales. He exhibits no tenderness.  Abdominal: Soft. Bowel sounds are normal. He exhibits no distension and no mass. There is no tenderness.  Genitourinary:  Genitourinary Comments: Uncircumcised penis. Moderate  erythema of the glans. Mild induration of the subcoronal area. No actual paraphimosis.  Musculoskeletal: Normal range of motion. He exhibits no edema.  Lymphadenopathy:    He has no cervical adenopathy.  Neurological: He is alert and oriented to person, place, and time. No cranial nerve deficit. He exhibits normal muscle tone. Coordination normal.  Skin: Skin is warm and dry. No rash noted.  Psychiatric: He has a normal mood and affect. His behavior is normal. Judgment and thought content normal.  Nursing note and vitals reviewed.  ED Treatments / Results  DIAGNOSTIC STUDIES: Oxygen Saturation is 97% on RA, normal by my interpretation.   COORDINATION OF CARE: 2:06 AM-Discussed next steps with pt including f/u w/ a Urologist. Pt verbalized understanding and is agreeable with the plan.   Labs (all labs ordered are listed, but only abnormal results are displayed) Labs Reviewed - No data to display   Procedures Procedures (including critical care time)  Medications Ordered in ED Medications - No data to display  Initial Impression / Assessment and Plan / ED Course  I have reviewed the triage vital signs and the nursing notes.  Pertinent lab results that were available during my care of the patient were reviewed by me and considered in my medical decision making (see chart for details).  Clinical Course    Episode of what sounds like paraphimosis which has resolved. There is some abrasions which are probably related to the patient's manipulation. No evidence of acute infection and no vascular compromise. Capillary blood glucose was elevated and he is given a dose of insulin. He is given a prescription for clotrimazole. He was set for discharge, but was uncomfortable with this. I have discussed case with Dr. Louis Meckel of urology service who states he can see him in the office in the next 36 hours. No other recommendations. He is given a prescription for oxycodone have acetaminophen for  pain. Return should symptoms worsen.  Final Clinical Impressions(s) / ED Diagnoses   Final diagnoses:  Balanitis  Hyperglycemia    New Prescriptions New Prescriptions   No medications on file   I personally performed the services described in this documentation, which was scribed in my presence. The recorded information has been reviewed and is accurate.      Delora Fuel, MD 83/15/17 [id number]

## 2016-03-23 NOTE — Discharge Instructions (Signed)
Monitor your blood sugars at home.Take acetaminophen or ibuprofen as needed. Return if you are getting worse.

## 2016-03-23 NOTE — ED Notes (Signed)
Pt upset and using curse words while communicating with this

## 2016-03-23 NOTE — ED Notes (Signed)
Pt does not want to leave. MD is at the bedside. Interpreter on the phone

## 2016-04-04 ENCOUNTER — Ambulatory Visit: Payer: Self-pay | Admitting: Internal Medicine

## 2016-07-16 ENCOUNTER — Encounter (INDEPENDENT_AMBULATORY_CARE_PROVIDER_SITE_OTHER): Payer: Self-pay | Admitting: Ophthalmology

## 2016-09-22 ENCOUNTER — Encounter: Payer: Self-pay | Admitting: Internal Medicine

## 2016-09-22 ENCOUNTER — Ambulatory Visit: Payer: Self-pay | Admitting: Internal Medicine

## 2016-09-22 VITALS — BP 138/82 | HR 86 | Temp 98.0°F | Resp 12 | Ht 66.25 in | Wt 160.0 lb

## 2016-09-22 DIAGNOSIS — I10 Essential (primary) hypertension: Secondary | ICD-10-CM

## 2016-09-22 DIAGNOSIS — E1165 Type 2 diabetes mellitus with hyperglycemia: Secondary | ICD-10-CM

## 2016-09-22 DIAGNOSIS — L02619 Cutaneous abscess of unspecified foot: Secondary | ICD-10-CM

## 2016-09-22 DIAGNOSIS — IMO0002 Reserved for concepts with insufficient information to code with codable children: Secondary | ICD-10-CM

## 2016-09-22 DIAGNOSIS — L03119 Cellulitis of unspecified part of limb: Secondary | ICD-10-CM

## 2016-09-22 DIAGNOSIS — Z794 Long term (current) use of insulin: Secondary | ICD-10-CM

## 2016-09-22 LAB — GLUCOSE, POCT (MANUAL RESULT ENTRY): POC Glucose: 348 mg/dl — AB (ref 70–99)

## 2016-09-22 MED ORDER — CEFTRIAXONE SODIUM 1 G IJ SOLR
1.0000 g | Freq: Once | INTRAMUSCULAR | Status: AC
  Administered 2016-09-22: 1 g via INTRAMUSCULAR

## 2016-09-22 NOTE — Progress Notes (Signed)
   Subjective:    Patient ID: Willie Jordan, male    DOB: 30-Sep-1965, 51 y.o.   MRN: [id number]  HPI   1.  Foot swelling and pain:  Walked in today with swollen, red foot for 1 week.  States his feet are cold all the time.  Massages his feet. Denies any injury to the foot in question.   Started with swelling of ankle and proximal foot 1 week ago.  Redness started at base of toes, distal foot the first day.  He noted the skin in that area was also warm to touch. States he was lying down most of the day with foot at bed level much of the week.  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   Noted peeling of skin at base of toes, mid foot 4 days ago as well. The redness worsened after he worked. Has pain in foot which worsens  with weight bearing.   No fever.    2.  DM:  States he is taking his Metformin 500 mg twice daily.  He states the XR ran out after 6 months. States he is using Novolog 70/30 10 units twice daily before meals.  Has no idea what his sugars are running as he has not obtained orange card and so cannot obtain glucometer and test strips at [facility].  Not clear he would qualify.    3.  Peripheral Neuropathy:  Not taking Gabapentin.  States it made him too sleepy for the following day.  Took for 1 month at 200 mg q hs.        Review of Systems     Objective:   Physical Exam  NAD, though limps favoring left foot. Lungs:  CTA CV:  RRR without murmur or rub, radial pulses normal and equal. Left Foot:  Swelling up to above malleoli and involving entire foot, including plantar aspect.  Has small scab--2 mm on plantar aspect of Metatarsal head, 2nd toe.  No foreign body seen.  No discharge with compression here.  No significant increase in pain with pressure here.  Redness to midfoot from base of toes on dorsal aspect of foot.  Mild increase in warmth over erythematous area. All of toes with swelling.  Able to palpate DP and PT pulses somewhat through edema.   Scabbed lesions bilaterally over pretibial areas.          Assessment & Plan:  1.  Left foot cellulitis:  Send for xray to rule out if obvious foreign body.  Ceftriaxone 1 g IM.  CBC, CMP.   Follow up in 24 hours. Elevate foot.  Avoid weight bearing on foot. Discussed at length if he does not work on following his diabetes and returning for rechecks, he will continue with diabetic complications.  2.  DM:  Continues with noncompliance.  Call into [facility] [place] to get hold of a pharmacist.  Need to find out what he still has available at [facility].  3.  Essential Hypertension:  Controlled today.  4.  Diabetic peripheral neuropathy:  Does not like the way he feels on Gabapentin, so hold on that for now.

## 2016-09-23 ENCOUNTER — Ambulatory Visit (HOSPITAL_COMMUNITY)
Admission: RE | Admit: 2016-09-23 | Discharge: 2016-09-23 | Disposition: A | Discharge status: Home / Self Care | Payer: Self-pay | Source: Ambulatory Visit | Attending: Internal Medicine | Admitting: Internal Medicine

## 2016-09-23 ENCOUNTER — Encounter: Payer: Self-pay | Admitting: Internal Medicine

## 2016-09-23 ENCOUNTER — Ambulatory Visit (INDEPENDENT_AMBULATORY_CARE_PROVIDER_SITE_OTHER): Payer: Self-pay | Admitting: Internal Medicine

## 2016-09-23 VITALS — BP 128/78 | HR 90 | Resp 12 | Ht 66.25 in | Wt 158.0 lb

## 2016-09-23 DIAGNOSIS — IMO0002 Reserved for concepts with insufficient information to code with codable children: Secondary | ICD-10-CM

## 2016-09-23 DIAGNOSIS — L02619 Cutaneous abscess of unspecified foot: Secondary | ICD-10-CM

## 2016-09-23 DIAGNOSIS — Z91199 Patient's noncompliance with other medical treatment and regimen due to unspecified reason: Secondary | ICD-10-CM

## 2016-09-23 DIAGNOSIS — L03119 Cellulitis of unspecified part of limb: Secondary | ICD-10-CM

## 2016-09-23 DIAGNOSIS — L03116 Cellulitis of left lower limb: Secondary | ICD-10-CM | POA: Insufficient documentation

## 2016-09-23 DIAGNOSIS — Z9119 Patient's noncompliance with other medical treatment and regimen: Secondary | ICD-10-CM

## 2016-09-23 DIAGNOSIS — E1165 Type 2 diabetes mellitus with hyperglycemia: Secondary | ICD-10-CM

## 2016-09-23 DIAGNOSIS — E1140 Type 2 diabetes mellitus with diabetic neuropathy, unspecified: Secondary | ICD-10-CM | POA: Insufficient documentation

## 2016-09-23 DIAGNOSIS — Z794 Long term (current) use of insulin: Secondary | ICD-10-CM

## 2016-09-23 LAB — CBC WITH DIFFERENTIAL/PLATELET
BASOS: 0 %
Basophils Absolute: 0 10*3/uL (ref 0.0–0.2)
EOS (ABSOLUTE): 0.1 10*3/uL (ref 0.0–0.4)
EOS: 1 %
HEMATOCRIT: 37.4 % — AB (ref 37.5–51.0)
HEMOGLOBIN: 12.4 g/dL — AB (ref 13.0–17.7)
IMMATURE GRANS (ABS): 0 10*3/uL (ref 0.0–0.1)
Immature Granulocytes: 0 %
LYMPHS: 11 %
Lymphocytes Absolute: 1.2 10*3/uL (ref 0.7–3.1)
MCH: 27.4 pg (ref 26.6–33.0)
MCHC: 33.2 g/dL (ref 31.5–35.7)
MCV: 83 fL (ref 79–97)
Monocytes Absolute: 0.6 10*3/uL (ref 0.1–0.9)
Monocytes: 5 %
Neutrophils Absolute: 9 10*3/uL — ABNORMAL HIGH (ref 1.4–7.0)
Neutrophils: 83 %
Platelets: 395 10*3/uL — ABNORMAL HIGH (ref 150–379)
RBC: 4.52 x10E6/uL (ref 4.14–5.80)
RDW: 12.7 % (ref 12.3–15.4)
WBC: 11 10*3/uL — ABNORMAL HIGH (ref 3.4–10.8)

## 2016-09-23 LAB — COMPREHENSIVE METABOLIC PANEL
A/G RATIO: 0.8 — AB (ref 1.2–2.2)
ALBUMIN: 3 g/dL — AB (ref 3.5–5.5)
ALK PHOS: 116 IU/L (ref 39–117)
ALT: 17 IU/L (ref 0–44)
AST: 14 IU/L (ref 0–40)
BUN / CREAT RATIO: 15 (ref 9–20)
BUN: 11 mg/dL (ref 6–24)
Bilirubin Total: 0.3 mg/dL (ref 0.0–1.2)
CHLORIDE: 90 mmol/L — AB (ref 96–106)
CO2: 28 mmol/L (ref 18–29)
Calcium: 9.3 mg/dL (ref 8.7–10.2)
Creatinine, Ser: 0.72 mg/dL — ABNORMAL LOW (ref 0.76–1.27)
GFR calc Af Amer: 128 mL/min/{1.73_m2} (ref >59)
GFR calc non Af Amer: 110 mL/min/{1.73_m2} (ref >59)
GLOBULIN, TOTAL: 3.7 g/dL (ref 1.5–4.5)
GLUCOSE: 326 mg/dL — AB (ref 65–99)
POTASSIUM: 4.4 mmol/L (ref 3.5–5.2)
SODIUM: 133 mmol/L — AB (ref 134–144)
Total Protein: 6.7 g/dL (ref 6.0–8.5)

## 2016-09-23 LAB — GLUCOSE, POCT (MANUAL RESULT ENTRY): POC GLUCOSE: 446 mg/dL — AB (ref 70–99)

## 2016-09-23 MED ORDER — METFORMIN HCL ER 500 MG PO TB24: ORAL_TABLET | ORAL | 3 refills | Status: DC

## 2016-09-23 MED ORDER — INSULIN ASPART PROT & ASPART (70-30 MIX) 100 UNIT/ML PEN: 12.0000 [IU] | PEN_INJECTOR | Freq: Two times a day (BID) | SUBCUTANEOUS | 11 refills | Status: DC

## 2016-09-23 MED ORDER — LISINOPRIL 2.5 MG PO TABS: 2.5000 mg | ORAL_TABLET | Freq: Every day | ORAL | 3 refills | Status: DC

## 2016-09-23 MED ORDER — HYDROCHLOROTHIAZIDE 25 MG PO TABS: 25.0000 mg | ORAL_TABLET | Freq: Every day | ORAL | 3 refills | Status: DC

## 2016-09-23 MED ORDER — CEFTRIAXONE SODIUM 1 G IJ SOLR
1.0000 g | Freq: Once | INTRAMUSCULAR | Status: AC
  Administered 2016-09-23: 1 g via INTRAMUSCULAR

## 2016-09-23 NOTE — Patient Instructions (Addendum)
Novolog 70/30 12 units twice daily   Get foot xrayed at [facility]

## 2016-09-23 NOTE — Progress Notes (Signed)
Subjective:    Patient ID: Willie Jordan, male    DOB: 07/12/1965, 51 y.o.   MRN: [id number]  HPI   1.  Left Foot cellulitis:  Did not get Xray of foot done.  Patient feels his foot is better today, despite the erythema of skin appearing to extend up higher.  States his toes and plantar aspect of MCP area is a bit more swollen.   States the pain is definitely better--does not hurt so much to bear weight.  No fever.  States he generally feels better and appetite is improved.  Does not feel as fatigued.  States yesterday before being treated, was hardly able to get out of bed.   Adds that his foot was much less swollen when he arose this morning, but has swelled up more with getting up and cleaning today.    Received 1 g Ceftriaxone yesterday IM.   2.  DM:  Not clear what he is doing with his Metformin XR.  States he had regular Metformin 1000 mg twice daily and went back to that since ran out.  Should have run out of his Lisinopril, HCTZ, Metformin XR first week of March.  He shows me a pill container with many pills for each medication.  He did not bring his bottles as asked.  He states he is getting the Novolog without a prescription at [facility].  States he did not realize he had a prescription.  His prescriptions for bp meds were the ones that expired, discussed he still had refills with Metformin XR until August.  Was to follow up in September, but did not. Pharmacy states he may be obtaining Novolin as that is sold without prescription.  They apparently do not keep a record of OTC purchased insulin.  Current Meds  Medication Sig  . hydrochlorothiazide (HYDRODIURIL) 25 MG tablet Take 1 tablet (25 mg total) by mouth daily with breakfast.  . insulin aspart protamine - aspart (NOVOLOG 70/30 MIX) (70-30) 100 UNIT/ML FlexPen Inject 0.12 mLs (12 Units total) into the skin 2 (two) times daily with a meal.  . lisinopril (PRINIVIL,ZESTRIL) 2.5 MG tablet Take 1 tablet (2.5 mg total) by mouth daily.  .  Multiple Vitamins-Minerals (MULTIVITAMIN ADULT PO) Take 1 capsule by mouth daily.  . [DISCONTINUED] hydrochlorothiazide (HYDRODIURIL) 25 MG tablet Take 1 tablet (25 mg total) by mouth daily with breakfast.  . [DISCONTINUED] insulin aspart protamine - aspart (NOVOLOG 70/30 MIX) (70-30) 100 UNIT/ML FlexPen Inject 0.1 mLs (10 Units total) into the skin daily with breakfast.  . [DISCONTINUED] insulin aspart protamine - aspart (NOVOLOG 70/30 MIX) (70-30) 100 UNIT/ML FlexPen Inject 0.12 mLs (12 Units total) into the skin 2 (two) times daily with a meal.  . [DISCONTINUED] lisinopril (PRINIVIL,ZESTRIL) 2.5 MG tablet Take 1 tablet (2.5 mg total) by mouth daily.  . [DISCONTINUED] metFORMIN (GLUCOPHAGE-XR) 500 MG 24 hr tablet 2 tabs by mouth with breakfast daily   Allergies  Allergen Reactions  . Aspirin Rash        Review of Systems     Objective:   Physical Exam  NAD Lungs:  CTA CV:  RRR without murmur or rub Left foot:  Skin is wrinkled over dorsal foot and ankle where swelling appears decreased.  He does have a patch of erythema just above the medial malleolus.  Erythema at base of toes, medial foot has resolved, but now extending from lateral base of toes to proximal foot and ankle.  The foot appears less swollen as a whole  today.  Skin peeling still at base of medial to lateral toes No palpable fluid collection, particularly over plantar aspect where small scab is still intact.  Good cap refill of toes. Good DP and PT pulses        Assessment & Plan:  1.  Left foot cellulitis:  Patient does look better today.  Discussed if he starts feeling worse in any way:  Fever, generalized aches, erythema traveling farther up leg:  To go immediately to ED. To get xray of foot today and then home. Emphasized he should be sitting down and elevating leg at all times unless needs to use the bathroom or get food.  No cleaning the house. Ceftriaxone 1 g IM for Day 2 of antibiotics.  2.  DM:  To  increase insulin to 12 units twice daily.  He is to bring in bottles and insulin tomorrow with bottles.   meds all refilled for 1 year as listed in med list.   All to [facility] save for the Novolog 70/30, which was sent to [facility] through [place].   Strongly urged to get moving on getting orange card and to go apply for MAP once foot better. Another long discussion regarding compliance with care.

## 2016-09-24 ENCOUNTER — Encounter: Payer: Self-pay | Admitting: Internal Medicine

## 2016-09-24 ENCOUNTER — Ambulatory Visit (INDEPENDENT_AMBULATORY_CARE_PROVIDER_SITE_OTHER): Payer: Self-pay | Admitting: Internal Medicine

## 2016-09-24 VITALS — BP 118/64 | HR 82 | Resp 12 | Ht 66.25 in | Wt 158.0 lb

## 2016-09-24 DIAGNOSIS — IMO0002 Reserved for concepts with insufficient information to code with codable children: Secondary | ICD-10-CM

## 2016-09-24 DIAGNOSIS — L03119 Cellulitis of unspecified part of limb: Secondary | ICD-10-CM

## 2016-09-24 DIAGNOSIS — Z9119 Patient's noncompliance with other medical treatment and regimen: Secondary | ICD-10-CM | POA: Insufficient documentation

## 2016-09-24 DIAGNOSIS — E1165 Type 2 diabetes mellitus with hyperglycemia: Secondary | ICD-10-CM

## 2016-09-24 DIAGNOSIS — Z794 Long term (current) use of insulin: Secondary | ICD-10-CM

## 2016-09-24 DIAGNOSIS — L02619 Cutaneous abscess of unspecified foot: Secondary | ICD-10-CM

## 2016-09-24 DIAGNOSIS — Z91199 Patient's noncompliance with other medical treatment and regimen due to unspecified reason: Secondary | ICD-10-CM | POA: Insufficient documentation

## 2016-09-24 LAB — GLUCOSE, POCT (MANUAL RESULT ENTRY): POC GLUCOSE: 304 mg/dL — AB (ref 70–99)

## 2016-09-24 MED ORDER — CEFTRIAXONE SODIUM 1 G IJ SOLR
1.0000 g | Freq: Once | INTRAMUSCULAR | Status: AC
  Administered 2016-09-24: 1 g via INTRAMUSCULAR

## 2016-09-24 NOTE — Patient Instructions (Signed)
14 unidados insulina dos veces al dia antes comer

## 2016-09-24 NOTE — Progress Notes (Signed)
   Subjective:    Patient ID: Willie Jordan, male    DOB: 1966-04-29, 51 y.o.   MRN: [id number]  HPI   1.  Left foot cellulitis:  Foot feeling better.  Feels better in general.  No fever.  Did get xray of foot--compatible with cellulitis, though may have involvement of proximal phalanx of 2nd toe proximal phalanx  No foreign body noted.   2.  DM:  Last night, sugar was 363, he thinks.  Sugar this morning fasting was 287.  Sugar here in clinic is not fasting.  Did not bring in insulin bottle today as asked.      Allergies  Allergen Reactions  . Aspirin Rash    Review of Systems     Objective:   Physical Exam  Path of erythema above medial malleolus very faint now.  Erythema at lateral toe bases to dorsal foot and ankle much decreased.  Peeling from base of toes continues.  No fluctuance.  Plantar aspect of foot without erythema or swelling.  Small scab mid distal plantar base-overlying 2nd ray basically without surrounding erythema or swelling.  Nontender. Good cap refill of toes with no erythema and good DP pulse.        Assessment & Plan:  Left Foot cellulitis:  Discussed he needs to continue to elevate leg and foot.  Ceftriaxone 1 g IM again today.   Will see if can switch to oral antibiotics at end of week.  DM:  Again, discussed he needs to have lower glucose to heal his foot.  Increase Novolin 70/30 to 14 units twice daily and watch diet.  Continue oral meds.

## 2016-09-25 ENCOUNTER — Ambulatory Visit (INDEPENDENT_AMBULATORY_CARE_PROVIDER_SITE_OTHER): Payer: Self-pay | Admitting: Internal Medicine

## 2016-09-25 ENCOUNTER — Encounter: Payer: Self-pay | Admitting: Internal Medicine

## 2016-09-25 VITALS — BP 142/80 | HR 90 | Temp 98.2°F | Resp 12 | Ht 66.25 in | Wt 156.0 lb

## 2016-09-25 DIAGNOSIS — L03116 Cellulitis of left lower limb: Secondary | ICD-10-CM

## 2016-09-25 DIAGNOSIS — Z794 Long term (current) use of insulin: Secondary | ICD-10-CM

## 2016-09-25 DIAGNOSIS — E1165 Type 2 diabetes mellitus with hyperglycemia: Secondary | ICD-10-CM

## 2016-09-25 MED ORDER — AMOXICILLIN-POT CLAVULANATE 875-125 MG PO TABS: 1.0000 | ORAL_TABLET | Freq: Two times a day (BID) | ORAL | 0 refills | Status: DC

## 2016-09-25 NOTE — Progress Notes (Signed)
   Subjective:    Patient ID: Willie Jordan, male    DOB: February 25, 1966, 51 y.o.   MRN: [id number]  HPI   1.  Left foot cellulitis: Feeling better.  No fever.  Foot with less pain and swelling.  Soaked foot only in the morning yesterday.  Did not soak in the evening.  States spent day with his children and did not get home until too late to soak foot.   Does finally bring in meds and clear he is using Novolin 70/30, not Novolog.  Did not increase his Novolin to 14 units twice daily as recommended--stayed at 12 units twice daily. His sugars were 325 last evening and this morning 331. Brings in old bottles of Gabapentin 100 mg, 3 caps at bedtime from 02/04/2016 and regular Metformin 1000 mg twice daily from same date.  States he is taking the latter twice daily with meals and not missing. Once again, discussed he should go to MAP to get started on applying for Novolog 70/30 or Humalog 75/25.    Current Meds  Medication Sig  . gabapentin (NEURONTIN) 100 MG capsule 3 caps by mouth at bedtime  . hydrochlorothiazide (HYDRODIURIL) 25 MG tablet Take 1 tablet (25 mg total) by mouth daily with breakfast.  . insulin NPH-regular Human (NOVOLIN 70/30) (70-30) 100 UNIT/ML injection Inject 12 Units into the skin 2 (two) times daily with a meal.  . lisinopril (PRINIVIL,ZESTRIL) 2.5 MG tablet Take 1 tablet (2.5 mg total) by mouth daily.  . metFORMIN (GLUCOPHAGE-XR) 500 MG 24 hr tablet 2 tabs by mouth with breakfast daily  . Multiple Vitamins-Minerals (MULTIVITAMIN ADULT PO) Take 1 capsule by mouth daily.   Allergies  Allergen Reactions  . Aspirin Rash     Review of Systems     Objective:   Physical Exam   NAD Lungs:  CTA CV:  RRR without murmur or rub, radial pulses normal and equal Left Foot:  Dead skin curling away from distal midfoot.  Cut pieces of dead skin away.  Erythema and swelling much decreased today.  Skin is wrinkling from decreased swelling.  Erythema really only from base of toes and  involving distal foot.  Toes with much decreased swelling and no erythema.  Good cap refill of toes and normal DP/PT pulses.          Assessment & Plan:  1.  Left Foot cellulitis:  Switch to Amoxicillin/Clavulanate 875/125 twice daily with another follow up tomorrow. Once again, emphasized seriousness of his infection and the need to follow instructions. To soak foot in mild soapy water twice daily and pat dry. To pick up oral antibiotic immediately today and take first dose.   2.  DM:  To increase Novolin 70/30 to 16 units twice daily before meals.  AGain, to go to [facility] and get set up with Novolog or HUmalog via MAP.  Has appt. Monday for orange card sign up with either Natosha or Estefania in the office. Taking the Metformin 1000 mg twice daily with meals.   He does have Rx for the XR, but has not picked up.

## 2016-09-25 NOTE — Patient Instructions (Signed)
16 units of Novolin 70/30 twice daily before meals Get your Amoxicillin Clavulanate now and take first dose immediately.

## 2016-09-26 ENCOUNTER — Ambulatory Visit (INDEPENDENT_AMBULATORY_CARE_PROVIDER_SITE_OTHER): Payer: Self-pay | Admitting: Internal Medicine

## 2016-09-26 ENCOUNTER — Encounter: Payer: Self-pay | Admitting: Internal Medicine

## 2016-09-26 VITALS — BP 150/82 | HR 90 | Temp 98.1°F | Resp 12 | Ht 66.25 in | Wt 159.0 lb

## 2016-09-26 DIAGNOSIS — L03116 Cellulitis of left lower limb: Secondary | ICD-10-CM

## 2016-09-26 DIAGNOSIS — E1165 Type 2 diabetes mellitus with hyperglycemia: Secondary | ICD-10-CM

## 2016-09-26 DIAGNOSIS — Z794 Long term (current) use of insulin: Secondary | ICD-10-CM

## 2016-09-26 NOTE — Patient Instructions (Signed)
Soak foot in warm soapy water, dry off really well and redress twice daily. Call office over the weekend for any problems Go to ED if foot worsens next 24 hours

## 2016-09-26 NOTE — Progress Notes (Signed)
   Subjective:    Patient ID: Willie Jordan, male    DOB: 12/06/1965, 51 y.o.   MRN: [id number]  HPI   1.  Here for follow up of Left foot infection:  Now draining pus from interdigital space between great toe and second toe.  States, no pain from foot, it otherwise feels like less swelling, able to move toes more.   No generalized symptoms Has taken three doses of Amoxicillin/Clavulanate since seen yesterday morning.  2.  DM:  Sugar last night was 370 and 270 this morning.  He did increase his Novolin 70/30 to 16 units twice daily. He has found his last Lisinopril bottle--dated 01/2016, so apparently, has been taking only rarely.  Current Meds  Medication Sig  . amoxicillin-clavulanate (AUGMENTIN) 875-125 MG tablet Take 1 tablet by mouth 2 (two) times daily.  Marland Kitchen gabapentin (NEURONTIN) 100 MG capsule 3 caps by mouth at bedtime  . hydrochlorothiazide (HYDRODIURIL) 25 MG tablet Take 1 tablet (25 mg total) by mouth daily with breakfast.  . insulin NPH-regular Human (NOVOLIN 70/30) (70-30) 100 UNIT/ML injection Inject 12 Units into the skin 2 (two) times daily with a meal.  . lisinopril (PRINIVIL,ZESTRIL) 2.5 MG tablet Take 1 tablet (2.5 mg total) by mouth daily.  . metFORMIN (GLUCOPHAGE-XR) 500 MG 24 hr tablet 2 tabs by mouth with breakfast daily  . Multiple Vitamins-Minerals (MULTIVITAMIN ADULT PO) Take 1 capsule by mouth daily.  Actually taking Novolin 70/30 16 units twice daily currently.  Not changing in med list as once gets orange card, plan to switch to MAP Humalog or Novolog combination.  Allergies  Allergen Reactions  . Aspirin Rash      Review of Systems     Objective:   Physical Exam  Left Foot:  Significantly decreased swelling.  No swelling above malleoli today and no erythema.  Erythema localized over dorsal area of foot from base between 2nd and 3rd toe to across to base of little toe extending to faint point mid dorsal foot. Scant pus with blood draining from a new  opening between great and 2nd toe.   Betadine swabs used to clean area of drainage.  Thick layer of keratinized dead skin peels from base of toes on plantar aspect revealing shallow  1 cm ulcer below where small scab was previously on plantar aspect of distal mid plantar foot.  No FB noted.  No drainage from this area. 11 blade use to open drainage point with improved drainage of similar fluid previously described.  Dead skin removed removed with blade as well.   Entire foot nontender.   Area of erythema with decreased swelling after improved release of drainage. Good cap refill of toes.  Good DP and PT pulses.       Assessment & Plan:  1.  Left Foot cellulitis and now abscess formation:  Draining nicely and much of swelling left in dorsal foot significantly decreased with opening of drainage site.  Plantar ulcer obvious today with removal of dead layer of skin that is peeling away.   Patient has elected to continue to treat outpatient and will see him tomorrow in clinic at 10 am To continue with twice daily soaks and dressing changes.  To place gauze in between toes to keep moisture wicking there.  2.  DM:  To increase Novolin 70/30 to 18 units twice daily.

## 2016-09-27 ENCOUNTER — Emergency Department (HOSPITAL_COMMUNITY): Payer: Self-pay

## 2016-09-27 ENCOUNTER — Observation Stay (HOSPITAL_COMMUNITY): Payer: Self-pay

## 2016-09-27 ENCOUNTER — Other Ambulatory Visit: Payer: Self-pay | Admitting: Internal Medicine

## 2016-09-27 ENCOUNTER — Inpatient Hospital Stay (HOSPITAL_COMMUNITY)
Admission: EM | Admit: 2016-09-27 | Discharge: 2016-10-05 | DRG: 854 | Disposition: A | Discharge status: Home Health | Payer: Self-pay | Attending: Family Medicine | Admitting: Family Medicine

## 2016-09-27 ENCOUNTER — Encounter (HOSPITAL_COMMUNITY): Payer: Self-pay | Admitting: Emergency Medicine

## 2016-09-27 DIAGNOSIS — R7989 Other specified abnormal findings of blood chemistry: Secondary | ICD-10-CM

## 2016-09-27 DIAGNOSIS — L03116 Cellulitis of left lower limb: Secondary | ICD-10-CM | POA: Diagnosis present

## 2016-09-27 DIAGNOSIS — E1142 Type 2 diabetes mellitus with diabetic polyneuropathy: Secondary | ICD-10-CM | POA: Diagnosis present

## 2016-09-27 DIAGNOSIS — Z794 Long term (current) use of insulin: Secondary | ICD-10-CM

## 2016-09-27 DIAGNOSIS — A419 Sepsis, unspecified organism: Principal | ICD-10-CM | POA: Diagnosis present

## 2016-09-27 DIAGNOSIS — E1165 Type 2 diabetes mellitus with hyperglycemia: Secondary | ICD-10-CM

## 2016-09-27 DIAGNOSIS — E1169 Type 2 diabetes mellitus with other specified complication: Secondary | ICD-10-CM | POA: Diagnosis present

## 2016-09-27 DIAGNOSIS — M00072 Staphylococcal arthritis, left ankle and foot: Secondary | ICD-10-CM | POA: Diagnosis present

## 2016-09-27 DIAGNOSIS — M869 Osteomyelitis, unspecified: Secondary | ICD-10-CM | POA: Diagnosis present

## 2016-09-27 DIAGNOSIS — E785 Hyperlipidemia, unspecified: Secondary | ICD-10-CM | POA: Diagnosis present

## 2016-09-27 DIAGNOSIS — D729 Disorder of white blood cells, unspecified: Secondary | ICD-10-CM

## 2016-09-27 DIAGNOSIS — M6518 Other infective (teno)synovitis, other site: Secondary | ICD-10-CM | POA: Diagnosis present

## 2016-09-27 DIAGNOSIS — Z886 Allergy status to analgesic agent status: Secondary | ICD-10-CM

## 2016-09-27 DIAGNOSIS — Z87891 Personal history of nicotine dependence: Secondary | ICD-10-CM

## 2016-09-27 DIAGNOSIS — Z833 Family history of diabetes mellitus: Secondary | ICD-10-CM

## 2016-09-27 DIAGNOSIS — L97529 Non-pressure chronic ulcer of other part of left foot with unspecified severity: Secondary | ICD-10-CM | POA: Diagnosis present

## 2016-09-27 DIAGNOSIS — D649 Anemia, unspecified: Secondary | ICD-10-CM | POA: Diagnosis present

## 2016-09-27 DIAGNOSIS — E11621 Type 2 diabetes mellitus with foot ulcer: Secondary | ICD-10-CM | POA: Diagnosis present

## 2016-09-27 DIAGNOSIS — E871 Hypo-osmolality and hyponatremia: Secondary | ICD-10-CM | POA: Diagnosis present

## 2016-09-27 DIAGNOSIS — D75838 Other thrombocytosis: Secondary | ICD-10-CM

## 2016-09-27 DIAGNOSIS — Z9889 Other specified postprocedural states: Secondary | ICD-10-CM

## 2016-09-27 DIAGNOSIS — L02612 Cutaneous abscess of left foot: Secondary | ICD-10-CM | POA: Diagnosis present

## 2016-09-27 DIAGNOSIS — B957 Other staphylococcus as the cause of diseases classified elsewhere: Secondary | ICD-10-CM | POA: Diagnosis present

## 2016-09-27 DIAGNOSIS — M86172 Other acute osteomyelitis, left ankle and foot: Secondary | ICD-10-CM

## 2016-09-27 DIAGNOSIS — E119 Type 2 diabetes mellitus without complications: Secondary | ICD-10-CM

## 2016-09-27 DIAGNOSIS — L089 Local infection of the skin and subcutaneous tissue, unspecified: Secondary | ICD-10-CM

## 2016-09-27 DIAGNOSIS — I10 Essential (primary) hypertension: Secondary | ICD-10-CM | POA: Diagnosis present

## 2016-09-27 DIAGNOSIS — E11628 Type 2 diabetes mellitus with other skin complications: Secondary | ICD-10-CM | POA: Diagnosis present

## 2016-09-27 LAB — URINALYSIS, ROUTINE W REFLEX MICROSCOPIC
BACTERIA UA: NONE SEEN
BILIRUBIN URINE: NEGATIVE
Glucose, UA: 50 mg/dL — AB
Hgb urine dipstick: NEGATIVE
KETONES UR: NEGATIVE mg/dL
Leukocytes, UA: NEGATIVE
Nitrite: NEGATIVE
Protein, ur: 100 mg/dL — AB
Specific Gravity, Urine: 1.016 (ref 1.005–1.030)
pH: 5 (ref 5.0–8.0)

## 2016-09-27 LAB — COMPREHENSIVE METABOLIC PANEL
ALBUMIN: 2.2 g/dL — AB (ref 3.5–5.0)
ALK PHOS: 110 U/L (ref 38–126)
ALT: 20 U/L (ref 17–63)
ANION GAP: 11 (ref 5–15)
AST: 25 U/L (ref 15–41)
BUN: 14 mg/dL (ref 6–20)
CALCIUM: 9 mg/dL (ref 8.9–10.3)
CO2: 28 mmol/L (ref 22–32)
Chloride: 93 mmol/L — ABNORMAL LOW (ref 101–111)
Creatinine, Ser: 0.83 mg/dL (ref 0.61–1.24)
GFR calc Af Amer: >60 mL/min (ref >60)
GFR calc non Af Amer: >60 mL/min (ref >60)
GLUCOSE: 217 mg/dL — AB (ref 65–99)
Potassium: 3.9 mmol/L (ref 3.5–5.1)
SODIUM: 132 mmol/L — AB (ref 135–145)
Total Bilirubin: 0.4 mg/dL (ref 0.3–1.2)
Total Protein: 7 g/dL (ref 6.5–8.1)

## 2016-09-27 LAB — CBC WITH DIFFERENTIAL/PLATELET
Basophils Absolute: 0 10*3/uL (ref 0.0–0.1)
Basophils Relative: 0 %
EOS ABS: 0.2 10*3/uL (ref 0.0–0.7)
Eosinophils Relative: 1 %
HEMATOCRIT: 34.1 % — AB (ref 39.0–52.0)
HEMOGLOBIN: 11.4 g/dL — AB (ref 13.0–17.0)
LYMPHS ABS: 1.4 10*3/uL (ref 0.7–4.0)
Lymphocytes Relative: 9 %
MCH: 27.5 pg (ref 26.0–34.0)
MCHC: 33.4 g/dL (ref 30.0–36.0)
MCV: 82.2 fL (ref 78.0–100.0)
MONOS PCT: 6 %
Monocytes Absolute: 1 10*3/uL (ref 0.1–1.0)
NEUTROS PCT: 84 %
Neutro Abs: 13 10*3/uL — ABNORMAL HIGH (ref 1.7–7.7)
Platelets: 406 10*3/uL — ABNORMAL HIGH (ref 150–400)
RBC: 4.15 MIL/uL — ABNORMAL LOW (ref 4.22–5.81)
RDW: 11.6 % (ref 11.5–15.5)
WBC: 15.7 10*3/uL — ABNORMAL HIGH (ref 4.0–10.5)

## 2016-09-27 LAB — LIPID PANEL
CHOL/HDL RATIO: 7.6 ratio
CHOLESTEROL: 214 mg/dL — AB (ref 0–200)
HDL: 28 mg/dL — AB (ref >40)
LDL Cholesterol: 150 mg/dL — ABNORMAL HIGH (ref 0–99)
Triglycerides: 181 mg/dL — ABNORMAL HIGH (ref <150)
VLDL: 36 mg/dL (ref 0–40)

## 2016-09-27 LAB — PROTIME-INR
INR: 1.05
Prothrombin Time: 13.7 seconds (ref 11.4–15.2)

## 2016-09-27 LAB — GLUCOSE, CAPILLARY
Glucose-Capillary: 103 mg/dL — ABNORMAL HIGH (ref 65–99)
Glucose-Capillary: 199 mg/dL — ABNORMAL HIGH (ref 65–99)

## 2016-09-27 LAB — I-STAT CG4 LACTIC ACID, ED
Lactic Acid, Venous: 2.13 mmol/L (ref 0.5–1.9)
Lactic Acid, Venous: 2.77 mmol/L (ref 0.5–1.9)

## 2016-09-27 LAB — C-REACTIVE PROTEIN: CRP: 16.1 mg/dL — AB (ref <1.0)

## 2016-09-27 LAB — LACTIC ACID, PLASMA
LACTIC ACID, VENOUS: 1.6 mmol/L (ref 0.5–1.9)
LACTIC ACID, VENOUS: 1.5 mmol/L (ref 0.5–1.9)

## 2016-09-27 LAB — PROCALCITONIN: Procalcitonin: <0.1 ng/mL

## 2016-09-27 LAB — APTT: APTT: 45 s — AB (ref 24–36)

## 2016-09-27 LAB — SEDIMENTATION RATE: SED RATE: 135 mm/h — AB (ref 0–16)

## 2016-09-27 MED ORDER — POLYETHYLENE GLYCOL 3350 17 G PO PACK
17.0000 g | PACK | Freq: Every day | ORAL | Status: DC | PRN
  Administered 2016-10-02 – 2016-10-04 (×2): 17 g via ORAL
  Filled 2016-09-27 (×2): qty 1

## 2016-09-27 MED ORDER — DEXTROSE 5 % IV SOLN
1.0000 g | Freq: Three times a day (TID) | INTRAVENOUS | Status: DC
  Administered 2016-09-27 – 2016-10-02 (×15): 1 g via INTRAVENOUS
  Filled 2016-09-27 (×18): qty 1

## 2016-09-27 MED ORDER — GABAPENTIN 300 MG PO CAPS
300.0000 mg | ORAL_CAPSULE | Freq: Every day | ORAL | Status: DC
  Administered 2016-09-27 – 2016-10-04 (×8): 300 mg via ORAL
  Filled 2016-09-27 (×8): qty 1

## 2016-09-27 MED ORDER — LISINOPRIL 5 MG PO TABS
2.5000 mg | ORAL_TABLET | Freq: Every day | ORAL | Status: DC
  Administered 2016-09-28 – 2016-10-05 (×8): 2.5 mg via ORAL
  Filled 2016-09-27 (×9): qty 1

## 2016-09-27 MED ORDER — HYDROCODONE-ACETAMINOPHEN 5-325 MG PO TABS
1.0000 | ORAL_TABLET | ORAL | Status: DC | PRN
  Administered 2016-09-29 – 2016-10-05 (×10): 2 via ORAL
  Filled 2016-09-27 (×11): qty 2

## 2016-09-27 MED ORDER — ACETAMINOPHEN 325 MG PO TABS
650.0000 mg | ORAL_TABLET | Freq: Four times a day (QID) | ORAL | Status: DC | PRN
  Administered 2016-10-01 – 2016-10-03 (×3): 650 mg via ORAL
  Filled 2016-09-27 (×4): qty 2

## 2016-09-27 MED ORDER — METRONIDAZOLE 500 MG PO TABS
500.0000 mg | ORAL_TABLET | Freq: Three times a day (TID) | ORAL | Status: DC
  Administered 2016-09-27: 500 mg via ORAL
  Filled 2016-09-27: qty 1

## 2016-09-27 MED ORDER — ONDANSETRON HCL 4 MG PO TABS: 4.0000 mg | ORAL_TABLET | Freq: Four times a day (QID) | ORAL | Status: DC | PRN

## 2016-09-27 MED ORDER — HYDROCHLOROTHIAZIDE 25 MG PO TABS
25.0000 mg | ORAL_TABLET | Freq: Every day | ORAL | Status: DC
  Administered 2016-09-28 – 2016-10-05 (×8): 25 mg via ORAL
  Filled 2016-09-27 (×8): qty 1

## 2016-09-27 MED ORDER — ACETAMINOPHEN 650 MG RE SUPP
650.0000 mg | Freq: Four times a day (QID) | RECTAL | Status: DC | PRN
Start: 2016-09-27 — End: 2016-10-05

## 2016-09-27 MED ORDER — ONDANSETRON HCL 4 MG/2ML IJ SOLN
4.0000 mg | Freq: Four times a day (QID) | INTRAMUSCULAR | Status: DC | PRN
  Administered 2016-10-01 – 2016-10-02 (×2): 4 mg via INTRAVENOUS
  Filled 2016-09-27 (×2): qty 2

## 2016-09-27 MED ORDER — ENOXAPARIN SODIUM 40 MG/0.4ML ~~LOC~~ SOLN
40.0000 mg | SUBCUTANEOUS | Status: DC
  Administered 2016-09-27 – 2016-10-04 (×7): 40 mg via SUBCUTANEOUS
  Filled 2016-09-27 (×7): qty 0.4

## 2016-09-27 MED ORDER — VANCOMYCIN HCL IN DEXTROSE 750-5 MG/150ML-% IV SOLN
750.0000 mg | Freq: Three times a day (TID) | INTRAVENOUS | Status: DC
  Administered 2016-09-27 – 2016-10-01 (×13): 750 mg via INTRAVENOUS
  Filled 2016-09-27 (×16): qty 150

## 2016-09-27 MED ORDER — SODIUM CHLORIDE 0.9 % IV SOLN
Freq: Once | INTRAVENOUS | Status: AC
  Administered 2016-09-27: 125 mL/h via INTRAVENOUS

## 2016-09-27 MED ORDER — SODIUM CHLORIDE 0.9 % IV BOLUS (SEPSIS)
1000.0000 mL | Freq: Once | INTRAVENOUS | Status: AC
  Administered 2016-09-27: 1000 mL via INTRAVENOUS

## 2016-09-27 MED ORDER — INSULIN ASPART PROT & ASPART (70-30 MIX) 100 UNIT/ML ~~LOC~~ SUSP
18.0000 [IU] | Freq: Two times a day (BID) | SUBCUTANEOUS | Status: DC
  Administered 2016-09-27 – 2016-09-30 (×5): 18 [IU] via SUBCUTANEOUS
  Filled 2016-09-27 (×3): qty 10

## 2016-09-27 MED ORDER — DEXTROSE 5 % IV SOLN
2.0000 g | INTRAVENOUS | Status: DC
  Administered 2016-09-27: 2 g via INTRAVENOUS
  Filled 2016-09-27: qty 2

## 2016-09-27 MED ORDER — TRAZODONE HCL 50 MG PO TABS
25.0000 mg | ORAL_TABLET | Freq: Every evening | ORAL | Status: DC | PRN
  Administered 2016-09-27 – 2016-09-29 (×2): 25 mg via ORAL
  Filled 2016-09-27 (×2): qty 1

## 2016-09-27 MED ORDER — INSULIN ASPART 100 UNIT/ML ~~LOC~~ SOLN
0.0000 [IU] | Freq: Three times a day (TID) | SUBCUTANEOUS | Status: DC
  Administered 2016-09-28 (×2): 5 [IU] via SUBCUTANEOUS
  Administered 2016-09-29: 2 [IU] via SUBCUTANEOUS
  Administered 2016-09-29: 1 [IU] via SUBCUTANEOUS
  Administered 2016-09-29: 2 [IU] via SUBCUTANEOUS
  Administered 2016-09-30: 5 [IU] via SUBCUTANEOUS
  Administered 2016-09-30: 2 [IU] via SUBCUTANEOUS
  Administered 2016-09-30: 3 [IU] via SUBCUTANEOUS
  Administered 2016-10-01: 2 [IU] via SUBCUTANEOUS
  Administered 2016-10-01: 3 [IU] via SUBCUTANEOUS
  Administered 2016-10-01 – 2016-10-02 (×2): 5 [IU] via SUBCUTANEOUS
  Administered 2016-10-02: 3 [IU] via SUBCUTANEOUS

## 2016-09-27 MED ORDER — SODIUM CHLORIDE 0.9 % IV BOLUS (SEPSIS)
250.0000 mL | Freq: Once | INTRAVENOUS | Status: AC
  Administered 2016-09-27: 250 mL via INTRAVENOUS

## 2016-09-27 NOTE — ED Notes (Addendum)
[facility] called and patient sent to ED because of worsening foot infection. Had xray earlier in week. Seen today for same and directed to ED. All previous care in [facility]

## 2016-09-27 NOTE — ED Notes (Signed)
Called patient and found patient.

## 2016-09-27 NOTE — ED Notes (Signed)
Attempted report 

## 2016-09-27 NOTE — ED Notes (Signed)
Patient not in waiting room  

## 2016-09-27 NOTE — Consult Note (Signed)
[facility] Nurse wound consult note Reason for Consult: Left foot full thickness wound between great toe and second digit.  Anterior foot and plantar foot with epidermal peeling Wound type: Infectious Pressure Injury POA: No (Wound POA, but this is not a pressure injury) Measurement: Anterior foot epidermal peeling measures 8cm x 7cm x 0.1cm.  Wound between Left great toe and Second digit measures 2.6cm x 1.5cm with depth undetermined due to necrotic tissue in base and red/brown purulent discharge/drainage in a moderate amount.  Plantar aspect of foot with epidermal peeling measuring 4cm x 4cm x 0.1cm with a pinpoint opening beneath the 3rd digit. Wound bed: As described above Drainage (amount, consistency, odor) As described above Periwound:As described above Dressing procedure/placement/frequency: I have provided Nursing with guidance for conservative care via the Orders.  I suggest consultation with Orthopedic physician for additional input to the [place] or for consideration of debridement.  If you agree, please order/arrange. Other intervention is beyond the scope of [place]. [facility] nursing team will not follow, but will remain available to this patient, the nursing and medical teams.  Please re-consult if needed. Thanks, Maudie Flakes, MSN, RN, [place], Arther Abbott  Pager# [phone]

## 2016-09-27 NOTE — Progress Notes (Signed)
   Subjective:    Patient ID: Willie Jordan, male    DOB: Mar 07, 1966, 51 y.o.   MRN: [id number]  HPI Patient here today with son and grandson at my request to follow up on cellulitis/abscess of left foot.   He has been followed for this daily since 09/22/2016, with first 3 days receiving 1 g Ceftriaxone.   Xrays on 09/23/2016 did not show abscess formation, though gas accumulation in soft tissues as expected with faint possible involvement in first phalanx of 2nd toe. Was initiated on oral Augmentin 875/125 mg twice daily on 09/25/2016. Yesterday, swelling was significantly down, but had developed pustular drainage from new opening between great and second toe.   Dead skin layer was debrided to find a shallow ulcer underlying a scab on plantar surface at MTP joint line, midfoot.  No drainage from here.   11 blade also utilized to open drainage area between 1st and 2nd toes. Patient states initially he feels better, but after examination of foot, he agrees foot has more swelling today. States he has been keeping his foot elevated and soaking in warm water followed by redressing twice daily  Blood glucose last night checked after he ate an apple 30 minutes:  390.  Glucose this morning 201.  He is taking Novolin 70/30 18 units twice daily.  Current Meds  Medication Sig  . amoxicillin-clavulanate (AUGMENTIN) 875-125 MG tablet Take 1 tablet by mouth 2 (two) times daily.  Marland Kitchen gabapentin (NEURONTIN) 100 MG capsule 3 caps by mouth at bedtime  . hydrochlorothiazide (HYDRODIURIL) 25 MG tablet Take 1 tablet (25 mg total) by mouth daily with breakfast.  . insulin NPH-regular Human (NOVOLIN 70/30) (70-30) 100 UNIT/ML injection Inject 12 Units into the skin 2 (two) times daily with a meal.  . lisinopril (PRINIVIL,ZESTRIL) 2.5 MG tablet Take 1 tablet (2.5 mg total) by mouth daily.  . metFORMIN (GLUCOPHAGE-XR) 500 MG 24 hr tablet 2 tabs by mouth with breakfast daily  . Multiple Vitamins-Minerals (MULTIVITAMIN  ADULT PO) Take 1 capsule by mouth daily.  Novolin 70/30 , 18 units twice daily injected subcutaneously.  Allergies  Allergen Reactions  . Aspirin Rash     Review of Systems     Objective:   Physical Exam  Left foot: swelling and erythema have now spread down plantar foot.  Erythema of dorsal foot now spread under great toe and more proximally.  Scant drainage from between great and second toe. Ulcer on plantar aspect without significant drainage.      Assessment & Plan:  Cellulitis and abscess of left foot:  Appears to be worse today.  Self pay and without orange card as well.  Failure of outpatient treatment:  He is to go to ED for inpatient care.

## 2016-09-27 NOTE — ED Triage Notes (Signed)
Seen at [facility] for wound check left foot infection. Doctor sent to ED for further evaluation because infection worsening. States yellow green drainage. Denies pain.

## 2016-09-27 NOTE — H&P (Signed)
History and Physical    Willie Jordan KDX:[id number] DOB: 01-12-1966 DOA: 09/27/2016  PCP: Mack Hook, MD   Patient coming from: [place]  Chief Complaint: Left foot infected wound  HPI: Shinichi Anguiano is a 51 y.o. male with medical history significant of HTN, hyperlipidemia, neuropathy, diabetes mellitus type 2 who presented to the emergency room peripheral of his PCP at [facility] for diabetic foot infection admission. Patient reported that on March 16-th he came back from work and noticed the redness is swelling over the left foot. On March 19-th he was seen at the [facility], had IM injection of ceftriaxone daily for 3 days. On March 20 patient underwent x-ray that showed no abscess, but gas accumulation in the soft tissues compatible with cellulitis Patient was placed on oral Augmentin 875/125 mg twice a day on March 22-nd after he completed the course of IM Rocephin. The following day in the office was noted that he developed some purulent drainage from the new opening between the left great and second toe left  and area was debrided. Today he was seen in follow-up and referred to the emergency department due to increased swelling erythema and increased drainage from the wound  ED Course: In the emergency department patient was afebrile, tachypneic, otherwise vital signs were stable, but work revealed leukocytosis of 15,700 mild anemia with hemoglobin 11.4, mild hyponatremia with sodium 132 Lactic acid was elevated at 2.13 and increased to 2.77, C-reactive protein elevated at 16.1 Right foot x-ray showed subcutaneous/myofascial gas involving left forefoot compatible with infection and necrotizing fasciitis could not be excluded radiographically with suspected potential osteomyelitis involving the base of the second proximal phalanx  Review of Systems: Patient denied foot pain, complaint of swelling or redness of the left  foot, denied fevers but complained of chills. Also complained of significantly impaired vision Denied chest pain shortness of breath, nausea vomiting, and hematuria dysuria melena As per HPI otherwise 10 point review of systems negative.   Ambulatory Status: independent  Past Medical History:  Diagnosis Date  . Hyperlipidemia   . Hypertension   . Neuropathy ([place])   . Type 2 diabetes mellitus, uncontrolled ([place])     Past Surgical History:  Procedure Laterality Date  . APPENDECTOMY      Social History   Social History  . Marital status: Married    Spouse name: N/A  . Number of children: N/A  . Years of education: N/A   Occupational History  . Not on file.   Social History Main Topics  . Smoking status: Former Smoker    Types: Cigarettes    Start date: 10/31/2015    Quit date: 01/05/2016  . Smokeless tobacco: Never Used  . Alcohol use No     Comment: rarely  . Drug use: No  . Sexual activity: Not Currently   Other Topics Concern  . Not on file   Social History Narrative  . No narrative on file    Allergies  Allergen Reactions  . Aspirin Rash    Family History  Problem Relation Age of Onset  . Diabetes Mother   . Diabetes Sister   . Diabetes Brother   . Cancer Maternal Grandmother     Prior to Admission medications   Medication Sig Start Date End Date Taking? Authorizing Provider  amoxicillin-clavulanate (AUGMENTIN) 875-125 MG tablet Take 1 tablet by mouth 2 (two) times daily. 09/25/16  Yes Mack Hook, MD  Cyanocobalamin (VITAMIN B-12 PO) Take 1  tablet by mouth daily.   Yes Historical Provider, MD  gabapentin (NEURONTIN) 100 MG capsule 3 caps by mouth at bedtime Patient taking differently: Take 300 mg by mouth at bedtime.  02/04/16  Yes Mack Hook, MD  hydrochlorothiazide (HYDRODIURIL) 25 MG tablet Take 1 tablet (25 mg total) by mouth daily with breakfast. 09/23/16  Yes Mack Hook, MD  insulin NPH-regular Human (NOVOLIN 70/30) (70-30)  100 UNIT/ML injection Inject 18 Units into the skin 2 (two) times daily with a meal.    Yes Historical Provider, MD  lisinopril (PRINIVIL,ZESTRIL) 2.5 MG tablet Take 1 tablet (2.5 mg total) by mouth daily. 09/23/16  Yes Mack Hook, MD  metFORMIN (GLUCOPHAGE-XR) 500 MG 24 hr tablet 2 tabs by mouth with breakfast daily Patient taking differently: Take 1,000 mg by mouth daily.  09/23/16  Yes Mack Hook, MD  insulin aspart protamine - aspart (NOVOLOG 70/30 MIX) (70-30) 100 UNIT/ML FlexPen Inject 0.12 mLs (12 Units total) into the skin 2 (two) times daily with a meal. Patient not taking: Reported on 09/25/2016 09/23/16   Mack Hook, MD    Physical Exam: Vitals:   09/27/16 1415 09/27/16 1430 09/27/16 1445 09/27/16 1504  BP:    (!) 141/79  Pulse: 81 81 79 80  Resp: (!) 22 20 (!) 0 19  Temp:      TempSrc:      SpO2: 95% 94% 95% 95%  Weight:      Height:         General: Appears calm and comfortable Eyes: PERRLA, EOMI, normal lids, iris ENT:  grossly normal hearing, lips & tongue, mucous membranes moist and intact Neck: no lymphoadenopathy, masses or thyromegaly Cardiovascular: RRR, no m/r/g. No JVD, carotid bruits. No LE edema.  Respiratory: bilateral no wheezes, rales, rhonchi or cracles. Normal respiratory effort. No accessory muscle use observed Abdomen: soft, non-tender, non-distended, no organomegaly or masses appreciated. BS present in all quadrants Skin: Dorsum of the left foot is swollen with brownish discoloration and sloughing skin, there is an open wound between left great toe and second toe draining purulent discharge with surrounding erythema Musculoskeletal: grossly normal tone BUE/BLE, good ROM, no bony abnormality or joint deformities observed Psychiatric: grossly normal mood and affect, speech fluent and appropriate, alert and oriented x3 Neurologic: CN II-XII grossly intact, moves all extremities in coordinated fashion, sensation intact  Labs on  Admission: I have personally reviewed following labs and imaging studies  CBC, BMP  GFR: Estimated Creatinine Clearance: 100.2 mL/min (by C-G formula based on SCr of 0.83 mg/dL).   Creatinine Clearance: Estimated Creatinine Clearance: 100.2 mL/min (by C-G formula based on SCr of 0.83 mg/dL).    Radiological Exams on Admission: Dg Foot Complete Left  Result Date: 09/27/2016 CLINICAL DATA:  51 year old male with a history of foot infection EXAM: LEFT FOOT - COMPLETE 3+ VIEW COMPARISON:  09/23/2016 FINDINGS: Persisting subcutaneous/ myofacial gas of the forefoot, between the first and second digits and second and third digits, as well as extension between the first and second metatarsals on the AP view. Erosive changes at the base of the second proximal phalanx again demonstrated. Degenerative changes of the hindfoot and midfoot. IMPRESSION: Re- demonstration of subcutaneous/myofacial gas involving the left forefoot, as above, compatible with infection. Although the gas has not progressed significantly from the comparison plain film, necrotizing fasciitis cannot be excluded radiographically. Re- demonstration of potential osteomyelitis involving the base of the second proximal phalanx. Electronically Signed   By: Corrie Mckusick D.O.   On: 09/27/2016  12:28   EKG: Not found    Problems:   Diabetic foot infection ([place]) Hypertension  Type 2 diabetes mellitus with neuropathy  Sepsis associated with diabetic foot infection Continue IV antibiotics, IVF and follow WBC and lactic acid   Diabetic foot infection Continue broad spectrum antibiotics- vancomycin and cefepime Procedures was left with MRI with and without contrast to rule out osteomyelitis Wound care consult to address wound dressings Dr. Amalia Hailey, podiatrist aware about the patient and will see him tomorrow afternoon, possibly patient will undergo I&D of the following day in the afternoon  Diabetes mellitus with diabetic neuropathy -  obtain hemoglobin A1c Hold Glucophage, continue preprandial insulin and add sliding scale insulin. Continue to monitor CBG every before meals and at bedtime and maintain carb modified diet  Hypertension  Continue home medications and adjust doses if needed  Hyperlipidemia - not treated, would've fresh lipid profile and address as needed    DVT prophylaxis: Lovenox  Code Status: Full  Family Communication: At bedside  Disposition Plan: MedSurg  Consults called: Dr. Amalia Hailey, podiatrist  Admission status: Inpatient  [place], [place] Pager: [phone] [facility]  If 7PM-7AM, please contact night-coverage [email or web address] Password [facility]  09/27/2016, 3:31 PM

## 2016-09-27 NOTE — Progress Notes (Signed)
Pharmacy Antibiotic Note  Willie Jordan is a 51 y.o. male admitted on 09/27/2016 with osteomyelitis.  Pharmacy has been consulted for Vancomycin / Cefepime dosing.  Plan: Cefepime 1 gram IV q 8 hours Vancomycin 750 mg iv q 8 hours  Height: 5\' 4"  (162.6 cm) Weight: 163 lb (73.9 kg) IBW/kg (Calculated) : 59.2  Temp (24hrs), Avg:98.7 F (37.1 C), Min:98.7 F (37.1 C), Max:98.7 F (37.1 C)   Recent Labs Lab 09/22/16 1234 09/27/16 1146 09/27/16 1158 09/27/16 1442  WBC 11.0* 15.7*  --   --   CREATININE 0.72* 0.83  --   --   LATICACIDVEN  --   --  2.13* 2.77*    Estimated Creatinine Clearance: 100.2 mL/min (by C-G formula based on SCr of 0.83 mg/dL).    Allergies  Allergen Reactions  . Aspirin Rash     Thank you  Anette Guarneri, PharmD [phone]. 09/27/2016 3:53 PM

## 2016-09-27 NOTE — ED Notes (Signed)
Dr Janne Napoleon notified of increasing Lactic.

## 2016-09-27 NOTE — ED Notes (Signed)
Patient transported to X-ray 

## 2016-09-27 NOTE — Patient Instructions (Signed)
Go to [facility] for inpatient care

## 2016-09-27 NOTE — ED Provider Notes (Signed)
[facility] DEPT Provider Note   CSN: [id number] Arrival date & time: 09/27/16  1040     History   Chief Complaint Chief Complaint  Patient presents with  . Wound Infection    HPI Willie Jordan is a 51 y.o. male with a PMHx of DM2, HLD, HTN, and peripheral neuropathy, who presents to the ED for admission for his diabetic foot wound that initially started as some swelling and redness on his L foot beginning Fri 09/19/16. Chart review reveals that he has [facility] foot wound since 09/22/16, per notes by Dr. Amil Amen: initially given 1g Ceftriaxone x3 days, xray on 09/23/16 showed no abscess but had gas accumulation in soft tissues c/w cellulitis, extending into 1st and 2nd toes and dorsum of foot; he was then initiated on oral Augmentin 875/125 mg BID on 09/25/2016; seen in the office yesterday 09/26/16 and per notes, swelling was improved, "but had developed pustular drainage from new opening between great and second toe" so the area was debrided and revealed a "shallow ulcer underlying a scab on plantar surface at MTP joint line, midfoot", so an 11 blade was used "to open drainage area between 1st and 2nd toes". Today he was seen for recheck, and although he initially stated he felt a little better, he agrees the foot had become more swollen today, so he was sent here for inpatient care of his diabetic foot wound that is failing outpatient therapy.  Patient states that initially he felt like the swelling was improving but today there has been more swelling on the dorsal aspect of his foot. He feels that the redness has actually improved since onset, however it still continues to be present. He has noticed some yellow purulent drainage from the I&D area. He additionally reports that he's had a decreased appetite. He also admits that he has chronic left foot numbness due to peripheral neuropathy however this has not changed recently. No other treatments tried  for his issues aside from the antibiotics that he was given, which he has been compliant with. No known aggravating factors. He continues to be compliant with his metformin 1000 twice a day and insulin 70/30 18 units twice a day as well as his home lisinopril. He denies any pain in the foot, warmth, red streaking, fevers, chills, chest pain, shortness of breath, abdominal pain, N/V/D, urinary complaints, focal weakness, or any other complaints at this time. No known antibiotic allergies, only drug allergy is ASA. Only PSHx is appendectomy.   The history is provided by the patient and medical records. A language interpreter was used.  Wound Check  This is a new problem. The current episode started more than 1 week ago. The problem occurs constantly. The problem has been gradually worsening. Pertinent negatives include no chest pain, no abdominal pain and no shortness of breath. Nothing aggravates the symptoms. Nothing relieves the symptoms. Treatments tried: ceftriaxone 1g IM x3 and augmentin 875-151m BID x2.5 days. The treatment provided mild relief.    Past Medical History:  Diagnosis Date  . Hyperlipidemia   . Hypertension   . Neuropathy ([facility]   . Type 2 diabetes mellitus, uncontrolled ([facility]     Patient Active Problem List   Diagnosis Date Noted  . Cellulitis of left foot 09/25/2016  . Noncompliance 09/24/2016  . HLD (hyperlipidemia) 09/30/2014  . Essential hypertension 09/30/2014  . Type 2 diabetes mellitus with hyperglycemia ([facility] 09/30/2014    Past Surgical History:  Procedure Laterality  Date  . APPENDECTOMY         Home Medications    Prior to Admission medications   Medication Sig Start Date End Date Taking? Authorizing Provider  amoxicillin-clavulanate (AUGMENTIN) 875-125 MG tablet Take 1 tablet by mouth 2 (two) times daily. 09/25/16   Mack Hook, MD  gabapentin (NEURONTIN) 100 MG capsule 3 caps by mouth at bedtime 02/04/16   Mack Hook, MD    hydrochlorothiazide (HYDRODIURIL) 25 MG tablet Take 1 tablet (25 mg total) by mouth daily with breakfast. 09/23/16   Mack Hook, MD  insulin aspart protamine - aspart (NOVOLOG 70/30 MIX) (70-30) 100 UNIT/ML FlexPen Inject 0.12 mLs (12 Units total) into the skin 2 (two) times daily with a meal. Patient not taking: Reported on 09/25/2016 09/23/16   Mack Hook, MD  insulin NPH-regular Human (NOVOLIN 70/30) (70-30) 100 UNIT/ML injection Inject 12 Units into the skin 2 (two) times daily with a meal.    Historical Provider, MD  lisinopril (PRINIVIL,ZESTRIL) 2.5 MG tablet Take 1 tablet (2.5 mg total) by mouth daily. 09/23/16   Mack Hook, MD  metFORMIN (GLUCOPHAGE-XR) 500 MG 24 hr tablet 2 tabs by mouth with breakfast daily 09/23/16   Mack Hook, MD  Multiple Vitamins-Minerals (MULTIVITAMIN ADULT PO) Take 1 capsule by mouth daily.    Historical Provider, MD    Family History Family History  Problem Relation Age of Onset  . Diabetes Mother   . Diabetes Sister   . Diabetes Brother   . Cancer Maternal Grandmother     Social History Social History  Substance Use Topics  . Smoking status: Former Smoker    Types: Cigarettes    Start date: 10/31/2015    Quit date: 01/05/2016  . Smokeless tobacco: Never Used  . Alcohol use No     Comment: rarely     Allergies   Aspirin   Review of Systems Review of Systems  Constitutional: Positive for appetite change. Negative for chills and fever.  Respiratory: Negative for shortness of breath.   Cardiovascular: Negative for chest pain.  Gastrointestinal: Negative for abdominal pain, diarrhea, nausea and vomiting.  Genitourinary: Negative for dysuria and hematuria.  Musculoskeletal: Positive for joint swelling. Negative for arthralgias and myalgias.  Skin: Positive for color change and wound.  Allergic/Immunologic: Positive for immunocompromised state (DM2).  Neurological: Positive for numbness (L foot, chronic and  unchanged). Negative for weakness.  Psychiatric/Behavioral: Negative for confusion.   10 Systems reviewed and are negative for acute change except as noted in the HPI.   Physical Exam Updated Vital Signs BP 139/77   Pulse 86   Temp 98.7 F (37.1 C) (Oral)   Resp 16   Ht '5\' 4"'  (1.626 m)   Wt 73.9 kg   SpO2 100%   BMI 27.98 kg/m   Physical Exam  Constitutional: He is oriented to person, place, and time. Vital signs are normal. He appears well-developed and well-nourished.  Non-toxic appearance. No distress.  Afebrile, nontoxic, NAD  HENT:  Head: Normocephalic and atraumatic.  Mouth/Throat: Oropharynx is clear and moist and mucous membranes are normal.  Eyes: Conjunctivae and EOM are normal. Right eye exhibits no discharge. Left eye exhibits no discharge.  Neck: Normal range of motion. Neck supple.  Cardiovascular: Normal rate, regular rhythm, normal heart sounds and intact distal pulses.  Exam reveals no gallop and no friction rub.   No murmur heard. Pulmonary/Chest: Effort normal and breath sounds normal. No respiratory distress. He has no decreased breath sounds. He has no wheezes.  He has no rhonchi. He has no rales.  Abdominal: Soft. Normal appearance and bowel sounds are normal. He exhibits no distension. There is no tenderness. There is no rigidity, no rebound, no guarding, no CVA tenderness, no tenderness at McBurney's point and negative Murphy's sign.  Musculoskeletal: Normal range of motion.       Left foot: There is tenderness, swelling and laceration (I&D site). There is normal range of motion, normal capillary refill and no deformity.       Feet:  L foot with FROM intact in all digits, mild TTP to midfoot and towards the 1st-2nd metatarsal region, with mild erythema, warmth, and induration of the midfoot extending towards the 1st-2nd interdigital webspace, but near the webspace there is some mild fluctuance and from the I&D site there is gas and yellow purulent material  easily expressed; skin desquamated up to midfoot. No deformity. Sensation grossly intact bilaterally, strength intact, distal pulses intact and symmetric. No red streaking. Soft compartments. SEE PICTURE BELOW  Neurological: He is alert and oriented to person, place, and time. He has normal strength. No sensory deficit.  Skin: Skin is warm, dry and intact. No rash noted. There is erythema.  L foot cellulitis as mentioned above and pictured below  Psychiatric: He has a normal mood and affect.  Nursing note and vitals reviewed.        ED Treatments / Results  Labs (all labs ordered are listed, but only abnormal results are displayed) Labs Reviewed  COMPREHENSIVE METABOLIC PANEL - Abnormal; Notable for the following:       Result Value   Sodium 132 (*)    Chloride 93 (*)    Glucose, Bld 217 (*)    Albumin 2.2 (*)    All other components within normal limits  CBC WITH DIFFERENTIAL/PLATELET - Abnormal; Notable for the following:    WBC 15.7 (*)    RBC 4.15 (*)    Hemoglobin 11.4 (*)    HCT 34.1 (*)    Platelets 406 (*)    Neutro Abs 13.0 (*)    All other components within normal limits  SEDIMENTATION RATE - Abnormal; Notable for the following:    Sed Rate 135 (*)    All other components within normal limits  C-REACTIVE PROTEIN - Abnormal; Notable for the following:    CRP 16.1 (*)    All other components within normal limits  I-STAT CG4 LACTIC ACID, ED - Abnormal; Notable for the following:    Lactic Acid, Venous 2.13 (*)    All other components within normal limits  CULTURE, BLOOD (ROUTINE X 2)  CULTURE, BLOOD (ROUTINE X 2)  AEROBIC CULTURE (SUPERFICIAL SPECIMEN)  URINALYSIS, ROUTINE W REFLEX MICROSCOPIC  I-STAT CG4 LACTIC ACID, ED    EKG  EKG Interpretation None       Radiology Dg Foot Complete Left  Result Date: 09/27/2016 CLINICAL DATA:  51 year old male with a history of foot infection EXAM: LEFT FOOT - COMPLETE 3+ VIEW COMPARISON:  09/23/2016 FINDINGS:  Persisting subcutaneous/ myofacial gas of the forefoot, between the first and second digits and second and third digits, as well as extension between the first and second metatarsals on the AP view. Erosive changes at the base of the second proximal phalanx again demonstrated. Degenerative changes of the hindfoot and midfoot. IMPRESSION: Re- demonstration of subcutaneous/myofacial gas involving the left forefoot, as above, compatible with infection. Although the gas has not progressed significantly from the comparison plain film, necrotizing fasciitis cannot be excluded radiographically. Re-  demonstration of potential osteomyelitis involving the base of the second proximal phalanx. Electronically Signed   By: Corrie Mckusick D.O.   On: 09/27/2016 12:28    L foot xray 09/23/16 Study Result:  CLINICAL DATA:  Diabetic neuropathy, cellulitis, infection  EXAM: LEFT FOOT - COMPLETE 3+ VIEW  COMPARISON:  Unavailable  FINDINGS: Soft tissue swelling and soft tissue air present of the left foot along the first and second toes. Soft tissue air extends along the dorsum of the foot and the plantar surface on the lateral view compatible with localized cellulitis from a gas-forming organism. No acute fracture or malalignment. No radiopaque or metallic foreign body. On the oblique view, there is minimal osseous irregularity of the second toe proximal phalanx at the second MTP joint suspicious for early osteomyelitis, best appreciated on the oblique view.  IMPRESSION: Left foot soft tissue swelling and soft tissue gas along the first and second toes but extending on the dorsum of the foot and plantar surface on the lateral view. Findings compatible with localized cellulitis as detailed above.  Subtle osseous irregularity of the left second toe proximal phalanx at the second MTP joint suspicious for early osteomyelitis.  No radiopaque foreign body.   Electronically Signed   By: Jerilynn Mages.  Shick M.D.    On: 09/23/2016 16:51     Procedures Procedures (including critical care time)  Medications Ordered in ED Medications  cefTRIAXone (ROCEPHIN) 2 g in dextrose 5 % 50 mL IVPB (2 g Intravenous New Bag/Given 09/27/16 1242)    And  metroNIDAZOLE (FLAGYL) tablet 500 mg (not administered)  0.9 %  sodium chloride infusion (125 mL/hr Intravenous New Bag/Given 09/27/16 1245)     Initial Impression / Assessment and Plan / ED Course  I have reviewed the triage vital signs and the nursing notes.  Pertinent labs & imaging results that were available during my care of the patient were reviewed by me and considered in my medical decision making (see chart for details).     51 y.o. male here for L foot infection that has failed outpatient antibiotics (ceftriaxone 1g IM x3 + augmentin 875/175m BID x2.5 days). Already had I&D performed and despite this, still continues to worsen. Xray 09/23/16 confirmed soft tissue gas, no abscess at that time. On exam, erythema and warmth to mid-foot, mild desquamation of tissue on dorsum of midfoot near 1st-2nd toes, mildly indurated in mid-foot but closer to the 1st-2nd interdigital webspace there's some fluctuance and gas and pus can be expressed from the wound. Mildly tender. Distal pulses intact, soft compartments, gross sensation intact however I suspect he has some component of peripheral neuropathy. Wound cultured, started on empiric abx AFTER cultures obtained. Will get labs, lactic, BCx, ESR/CRP, and U/A. Will get repeat xray as well. Started on rocephin/flagyl for now. Will likely admit. Discussed case with my attending Dr. LRex Kraswho agrees with plan.  Pt declines needing anything for pain, or otherwise, will reassess shortly.   1:28 PM CBC w/diff showing mildly leukocytosis with neutrophilic predominance; also with mild chronic anemia near baseline; plt 406 which is similar to prior values but could represent acute phase reactant. CMP with mildly elevated gluc 217,  Na 132 which corrects for glucose value, otherwise fairly unremarkable. Lactic mildly elevated 2.13, fluids running. ESR 135. CRP 16.1. Xray redemonstrates subQ gas of L forefoot c/w infection, hasn't progressed significantly however necrotizing fasciitis cannot be excluded radiolographically; also redemonstrates possible osteomyelitis of base of 2nd proximal phalanx. Will proceed with admission, he will  likely need surgery for debridement +/- amputation.   1:52 PM [facility] returning page and will admit, however would like orthopedist consult; will consult them and ensure they're on board with pt's care. Holding orders to be placed by admitting team. Please see their notes for further documentation of care. I appreciate their help with this pleasant pt's care. Pt stable at time of admission.   Final Clinical Impressions(s) / ED Diagnoses   Final diagnoses:  Cellulitis of left foot  Diabetic foot infection ([place])  Chronic anemia  Type 2 diabetes mellitus with other skin complication, with long-term current use of insulin ([facility])  Neutrophilic leukocytosis  Reactive thrombocytosis    New Prescriptions New Prescriptions   No medications on file     [address] PA-C 09/27/16 1353  ADDENDUM 2:42 PM: No return page from Ortho yet, however I called Dr. Amalia Hailey (podiatry) directly and he is delighted to consult on this patient and take care of any surgical needs he may have. Dr. Amalia Hailey is in [place] today, so he took down the pt's information and will be in touch with the [facility] admission team, and will come see the pt tomorrow and consult on him while he's admitted. Please see his notes as well as the admission notes for further documentation of care. Admit holding orders are in. Pt stable at time of admission.     [address], PA-C 09/27/16 [place], MD 09/28/16 [phone]

## 2016-09-28 ENCOUNTER — Inpatient Hospital Stay (HOSPITAL_COMMUNITY): Payer: Self-pay

## 2016-09-28 DIAGNOSIS — D649 Anemia, unspecified: Secondary | ICD-10-CM

## 2016-09-28 DIAGNOSIS — L97509 Non-pressure chronic ulcer of other part of unspecified foot with unspecified severity: Secondary | ICD-10-CM

## 2016-09-28 DIAGNOSIS — E11621 Type 2 diabetes mellitus with foot ulcer: Secondary | ICD-10-CM

## 2016-09-28 LAB — HEMOGLOBIN A1C
Hgb A1c MFr Bld: 13.2 % — ABNORMAL HIGH (ref 4.8–5.6)
MEAN PLASMA GLUCOSE: 332 mg/dL

## 2016-09-28 LAB — COMPREHENSIVE METABOLIC PANEL
ALBUMIN: 1.7 g/dL — AB (ref 3.5–5.0)
ALK PHOS: 92 U/L (ref 38–126)
ALT: 16 U/L — AB (ref 17–63)
ANION GAP: 8 (ref 5–15)
AST: 16 U/L (ref 15–41)
BUN: 8 mg/dL (ref 6–20)
CALCIUM: 8.3 mg/dL — AB (ref 8.9–10.3)
CO2: 29 mmol/L (ref 22–32)
Chloride: 101 mmol/L (ref 101–111)
Creatinine, Ser: 0.63 mg/dL (ref 0.61–1.24)
GFR calc Af Amer: >60 mL/min (ref >60)
GFR calc non Af Amer: >60 mL/min (ref >60)
GLUCOSE: 90 mg/dL (ref 65–99)
Potassium: 3.5 mmol/L (ref 3.5–5.1)
Sodium: 138 mmol/L (ref 135–145)
Total Bilirubin: 0.6 mg/dL (ref 0.3–1.2)
Total Protein: 5.9 g/dL — ABNORMAL LOW (ref 6.5–8.1)

## 2016-09-28 LAB — CBC WITH DIFFERENTIAL/PLATELET
Basophils Absolute: 0 10*3/uL (ref 0.0–0.1)
Basophils Relative: 0 %
Eosinophils Absolute: 0.2 10*3/uL (ref 0.0–0.7)
Eosinophils Relative: 2 %
HEMATOCRIT: 28.3 % — AB (ref 39.0–52.0)
Hemoglobin: 9.2 g/dL — ABNORMAL LOW (ref 13.0–17.0)
Lymphocytes Relative: 17 %
Lymphs Abs: 1.7 10*3/uL (ref 0.7–4.0)
MCH: 26.6 pg (ref 26.0–34.0)
MCHC: 32.5 g/dL (ref 30.0–36.0)
MCV: 81.8 fL (ref 78.0–100.0)
MONO ABS: 0.7 10*3/uL (ref 0.1–1.0)
MONOS PCT: 7 %
Neutro Abs: 7.5 10*3/uL (ref 1.7–7.7)
Neutrophils Relative %: 74 %
PLATELETS: 325 10*3/uL (ref 150–400)
RBC: 3.46 MIL/uL — ABNORMAL LOW (ref 4.22–5.81)
RDW: 11.7 % (ref 11.5–15.5)
WBC: 10.1 10*3/uL (ref 4.0–10.5)

## 2016-09-28 LAB — HIV ANTIBODY (ROUTINE TESTING W REFLEX): HIV Screen 4th Generation wRfx: NONREACTIVE

## 2016-09-28 LAB — GLUCOSE, CAPILLARY
GLUCOSE-CAPILLARY: 99 mg/dL (ref 65–99)
GLUCOSE-CAPILLARY: 257 mg/dL — AB (ref 65–99)
Glucose-Capillary: 255 mg/dL — ABNORMAL HIGH (ref 65–99)
Glucose-Capillary: 217 mg/dL — ABNORMAL HIGH (ref 65–99)

## 2016-09-28 MED ORDER — GADOBENATE DIMEGLUMINE 529 MG/ML IV SOLN
15.0000 mL | Freq: Once | INTRAVENOUS | Status: AC | PRN
  Administered 2016-09-28: 15 mL via INTRAVENOUS

## 2016-09-28 MED ORDER — ATORVASTATIN CALCIUM 20 MG PO TABS
20.0000 mg | ORAL_TABLET | Freq: Every day | ORAL | Status: DC
  Administered 2016-09-28 – 2016-10-02 (×4): 20 mg via ORAL
  Filled 2016-09-28 (×4): qty 1

## 2016-09-28 NOTE — Progress Notes (Signed)
Pt back from MRI 

## 2016-09-28 NOTE — Progress Notes (Signed)
Pt seen by Dr. Amalia Hailey, podiatrist and ordered for surgery tomorrow afternoon for amputation of the Left second toe and do incision and drainage and pt can have breakfast, instructed and understood.

## 2016-09-28 NOTE — Progress Notes (Addendum)
[facility]                                                                              Patient Demographics  Willie Jordan, is a 51 y.o. male, DOB - [birth year], IFO:[id number]  Admit date - 09/27/2016   Admitting Physician Maren Reamer, MD  Outpatient Primary MD for the patient is [facility], MD  Outpatient specialists:   LOS - 1  days    Chief Complaint  Patient presents with  . Wound Infection       Brief summary  Willie Jordan is a 51 y.o. male with medical history significant of HTN, hyperlipidemia, neuropathy, diabetes mellitus type 2 who presented to ED with left foot infected wound.Patient reported that on March 16th he came back from work and noticed the redness and swelling over the left foot. On March 19-th, he was seen at community clinic, had IM injection of ceftriaxone daily for 3 days. On March 20 patient underwent x-ray that showed no abscess, but gas accumulation in the soft tissues compatible with cellulitis. Patient was placed on oral Augmentin 875/125 mg twice a day on March 22-nd after he completed the course of IM Rocephin. The following day in the office was noted that he developed some purulent drainage from the new opening between the left great and second toe left and area was debrided. He was seen in the [place] for follow-up and sent to ED for increased swelling erythema and increased drainage from the wound    Assessment & Plan    Principal Problem: Sepsis associated with diabetic foot infection - Patient placed on IV vancomycin and cefepime, IV fluids, pain control  - Follow intraoperative cultures, blood cultures - MRI of the left foot showed large complex fluid collection in the forefoot extending into the second web space into the dorsal and plantar aspects of the foot consistent with an abscess, proximal extension to the level of the mid metatarsal shafts. Associated synovitis of the second  metatarsophalangeal joint consistent with septic joint and complex post and second right tenosynovitis. Also osteomyelitis of second proximal phalanx, early cellulitis of the second metatarsal head and neck.    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 Amalia Hailey, podiatrist aware about the patient and will see him ADDENDUM 6:10pm - I spoke with Dr Amalia Hailey, will see patient tonight and possible surgery tomorrow  Diabetes mellitus with diabetic neuropathy - obtain hemoglobin A1c - Hold Glucophage, - Continue sliding scale insulin while inpatient   Hypertension  - Currently stable, continue HCTZ, lisinopril  Hyperlipidemia - not treated - LDL 150, cholesterol 214, placed on Lipitor 20 mg daily  Code Status: full code  DVT Prophylaxis:  Lovenox  Family Communication: Discussed in detail with the patient, all imaging results, lab results explained to the patient and wife  Disposition Plan: Awaiting surgery  Time Spent in minutes   25 minutes  Procedures:    Consultants:   Podiatry  Antimicrobials:   IV vancomycin  IV cefepime  Medications  Scheduled Meds: . ceFEPime (MAXIPIME) IV  1 g Intravenous Q8H  . enoxaparin (LOVENOX) injection  40 mg Subcutaneous Q24H  . gabapentin  300 mg Oral QHS  . hydrochlorothiazide  25 mg Oral Q breakfast  . insulin aspart  0-9 Units Subcutaneous TID WC  . insulin aspart protamine- aspart  18 Units Subcutaneous BID WC  . lisinopril  2.5 mg Oral Daily  . vancomycin  750 mg Intravenous Q8H   Continuous Infusions: PRN Meds:.acetaminophen **OR** acetaminophen, HYDROcodone-acetaminophen, ondansetron **OR** ondansetron (ZOFRAN) IV, polyethylene glycol, traZODone   Antibiotics   Anti-infectives    Start     Dose/Rate Route Frequency Ordered Stop   09/27/16 1600  vancomycin  (VANCOCIN) IVPB 750 mg/150 ml premix     750 mg 150 mL/hr over 60 Minutes Intravenous Every 8 hours 09/27/16 1556     09/27/16 1600  ceFEPIme (MAXIPIME) 1 g in dextrose 5 % 50 mL IVPB     1 g 100 mL/hr over 30 Minutes Intravenous Every 8 hours 09/27/16 1556     09/27/16 1400  metroNIDAZOLE (FLAGYL) tablet 500 mg  Status:  Discontinued     500 mg Oral Every 8 hours 09/27/16 1147 09/27/16 1550   09/27/16 1200  cefTRIAXone (ROCEPHIN) 2 g in dextrose 5 % 50 mL IVPB  Status:  Discontinued     2 g 100 mL/hr over 30 Minutes Intravenous Every 24 hours 09/27/16 1147 09/27/16 1550        Subjective:   Willie Jordan was seen and examined today.  Complaining of dressing too tight otherwise no complaints. Patient denies dizziness, chest pain, shortness of breath, abdominal pain, N/V/D/C, new weakness, numbess, tingling. No acute events overnight.    Objective:   Vitals:   09/27/16 1603 09/27/16 2139 09/28/16 0445 09/28/16 1206  BP: 133/68 (!) 125/58 132/60 (!) 143/75  Pulse: 78 82 80 72  Resp:  18 17 18   Temp: 99.4 F (37.4 C) 99.8 F (37.7 C) 98.3 F (36.8 C) 98.8 F (37.1 C)  TempSrc: Oral Oral Oral Oral  SpO2: 97% 96% 97% 96%  Weight: 73.9 kg (163 lb)     Height: 5\' 4"  (1.626 m)       Intake/Output Summary (Last 24 hours) at 09/28/16 1250 Last data filed at 09/28/16 1053  Gross per 24 hour  Intake             1750 ml  Output             2145 ml  Net             -395 ml     Wt Readings from Last 3 Encounters:  09/27/16 73.9 kg (163 lb)  09/26/16 72.1 kg (159 lb)  09/25/16 70.8 kg (156 lb)     Exam  General: Alert and oriented x 3, NAD  HEENT:  Neck: Supple, no JVD, no masses  Cardiovascular: S1 S2 auscultated, no rubs, murmurs or gallops. Regular rate and rhythm.  Respiratory: Clear to auscultation bilaterally, no wheezing, rales or rhonchi  Gastrointestinal: Soft, nontender, nondistended, + bowel sounds  Ext: no cyanosis clubbing or edema  Neuro: AAOx3, Cr  N's II- XII. Strength 5/5 upper and lower extremities bilaterally  Skin: open wound between left great toe and second toe, purulent discharge with surrounding erythema  Psych: Normal affect and demeanor, alert and oriented x3    Data Reviewed:  I have personally reviewed following labs and imaging studies  Micro Results Recent Results (from  the past 240 hour(s))  Wound or Superficial Culture     Status: None (Preliminary result)   Collection Time: 09/27/16 11:43 AM  Result Value Ref Range Status   Specimen Description WOUND  Final   Special Requests LEFT FOOT  Final   Gram Stain   Final    ABUNDANT WBC PRESENT, PREDOMINANTLY PMN FEW GRAM POSITIVE COCCI IN CLUSTERS FEW GRAM POSITIVE COCCI IN PAIRS    Culture CULTURE REINCUBATED FOR BETTER GROWTH  Final   Report Status PENDING  Incomplete    Radiology Reports Mr Foot Left W Wo Contrast  Addendum Date: 09/28/2016   ADDENDUM REPORT: 09/28/2016 09:50 ADDENDUM: These results were called by telephone at the time of interpretation on 09/28/2016 at 9:49 am to Dr. York Grice , who verbally acknowledged these results. Timely orthopedic consultation recommended. Electronically Signed   By: Richardean Sale M.D.   On: 09/28/2016 09:50   Result Date: 09/28/2016 CLINICAL DATA:  Diabetic with nonhealing wound dorsally between the first and second toes. History of neuropathy. Sepsis. EXAM: MRI OF THE LEFT FOREFOOT WITHOUT AND WITH CONTRAST TECHNIQUE: Multiplanar, multisequence MR imaging of the left forefoot was performed both before and after administration of intravenous contrast. CONTRAST:  3mL MULTIHANCE GADOBENATE DIMEGLUMINE 529 MG/ML IV SOLN COMPARISON:  Radiographs 09/27/2016. FINDINGS: Bones/Joint/Cartilage There is a large ill-defined, complex fluid collection within the first web space. This collection is best defined on the post-contrast images. There is relatively limited dorsal extension along the distal half of the second metatarsal  shaft and second proximal phalanx. There is more extensive plantar extension along the first and second metatarsals and proximal phalanges. There are multiple foci of low signal within these fluid collections, corresponding with soft tissue emphysema on the patient's radiographs. There is irregular enhancement of these collections following contrast. Approximate overall dimensions are 6.2 x 4.2 x 4.3 cm. There is probable extension to the skin dorsally at the first web space. The second proximal phalanx demonstrates abnormally decreased T1 and increased T2 signal with cortical irregularity highly suspicious for osteomyelitis. There is diffuse marrow enhancement following contrast. There is also T2 hyperintensity and enhancement within the second metatarsal head and neck which could reflect osteomyelitis. There is synovial enhancement of the second metatarsal phalangeal joint. There is some low-level T2 hyperintensity and enhancement within the first toe phalanges and the sesamoids of the first metatarsal. There is no associated abnormal T1 signal or cortical destruction. There is no suspicious signal in the first metatarsal or the third through fifth rays. Ligaments The Lisfranc ligament is intact. Muscles and Tendons The complex fluid collections described above partially envelope the flexor tendons of the first and second toes and the extensor tendon of the second toe at the level of the metatarsals. This is worrisome for infectious tenosynovitis. There is extensive T2 hyperintensity and low level enhancement throughout the forefoot musculature. Soft tissues As above, there is a complex fluid collection extending both dorsal and plantar from the first web space consistent with an abscess. In addition, there is generalized subcutaneous edema throughout the forefoot consistent with cellulitis. IMPRESSION: 1. Large complex fluid collection medially in the forefoot, extending from the second web space into the dorsal  and plantar aspects of the foot, consistent with an abscess. There is proximal extension to the level of the mid metatarsal shafts. 2. Associated synovitis of the second metatarsal phalangeal joint consistent with septic joint, and complex first and second ray tenosynovitis worrisome for infectious tenosynovitis. 3. Findings are highly worrisome for  osteomyelitis of the second proximal phalanx. There is also probable early osteomyelitis of the second metatarsal head and neck. Less specific marrow changes within the phalanges of the great toe may be reactive, although early osteomyelitis difficult to exclude. 4. Generalized forefoot cellulitis. Electronically Signed: By: Richardean Sale M.D. On: 09/28/2016 09:31   Dg Chest Port 1 View  Result Date: 09/27/2016 CLINICAL DATA:  Osteomyelitis EXAM: PORTABLE CHEST 1 VIEW COMPARISON:  05/29/2007 FINDINGS: Cardiomediastinal silhouette is unremarkable. No infiltrate or pleural effusion. No pulmonary edema. Mild mid thoracic dextroscoliosis. IMPRESSION: No active disease. Electronically Signed   By: Lahoma Crocker M.D.   On: 09/27/2016 16:26   Dg Foot Complete Left  Result Date: 09/27/2016 CLINICAL DATA:  51 year old male with a history of foot infection EXAM: LEFT FOOT - COMPLETE 3+ VIEW COMPARISON:  09/23/2016 FINDINGS: Persisting subcutaneous/ myofacial gas of the forefoot, between the first and second digits and second and third digits, as well as extension between the first and second metatarsals on the AP view. Erosive changes at the base of the second proximal phalanx again demonstrated. Degenerative changes of the hindfoot and midfoot. IMPRESSION: Re- demonstration of subcutaneous/myofacial gas involving the left forefoot, as above, compatible with infection. Although the gas has not progressed significantly from the comparison plain film, necrotizing fasciitis cannot be excluded radiographically. Re- demonstration of potential osteomyelitis involving the base of  the second proximal phalanx. Electronically Signed   By: Corrie Mckusick D.O.   On: 09/27/2016 12:28   Dg Foot Complete Left  Result Date: 09/23/2016 CLINICAL DATA:  Diabetic neuropathy, cellulitis, infection EXAM: LEFT FOOT - COMPLETE 3+ VIEW COMPARISON:  Unavailable FINDINGS: Soft tissue swelling and soft tissue air present of the left foot along the first and second toes. Soft tissue air extends along the dorsum of the foot and the plantar surface on the lateral view compatible with localized cellulitis from a gas-forming organism. No acute fracture or malalignment. No radiopaque or metallic foreign body. On the oblique view, there is minimal osseous irregularity of the second toe proximal phalanx at the second MTP joint suspicious for early osteomyelitis, best appreciated on the oblique view. IMPRESSION: Left foot soft tissue swelling and soft tissue gas along the first and second toes but extending on the dorsum of the foot and plantar surface on the lateral view. Findings compatible with localized cellulitis as detailed above. Subtle osseous irregularity of the left second toe proximal phalanx at the second MTP joint suspicious for early osteomyelitis. No radiopaque foreign body. Electronically Signed   By: Jerilynn Mages.  Shick M.D.   On: 09/23/2016 16:51    Lab Data:  CBC:  Recent Labs Lab 09/22/16 1234 09/27/16 1146 09/28/16 0451  WBC 11.0* 15.7* 10.1  NEUTROABS 9.0* 13.0* 7.5  HGB  --  11.4* 9.2*  HCT 37.4* 34.1* 28.3*  MCV 83 82.2 81.8  PLT 395* 406* 725   Basic Metabolic Panel:  Recent Labs Lab 09/22/16 1234 09/27/16 1146 09/28/16 0451  NA 133* 132* 138  K 4.4 3.9 3.5  CL 90* 93* 101  CO2 28 28 29   GLUCOSE 326* 217* 90  BUN 11 14 8   CREATININE 0.72* 0.83 0.63  CALCIUM 9.3 9.0 8.3*   GFR: Estimated Creatinine Clearance: 104 mL/min (by C-G formula based on SCr of 0.63 mg/dL). Liver Function Tests:  Recent Labs Lab 09/22/16 1234 09/27/16 1146 09/28/16 0451  AST 14 25 16     ALT 17 20 16*  ALKPHOS 116 110 92  BILITOT 0.3 0.4 0.6  PROT 6.7 7.0 5.9*  ALBUMIN 3.0* 2.2* 1.7*   No results for input(s): LIPASE, AMYLASE in the last 168 hours. No results for input(s): AMMONIA in the last 168 hours. Coagulation Profile:  Recent Labs Lab 09/27/16 1643  INR 1.05   Cardiac Enzymes: No results for input(s): CKTOTAL, CKMB, CKMBINDEX, TROPONINI in the last 168 hours. BNP (last 3 results) No results for input(s): PROBNP in the last 8760 hours. HbA1C: No results for input(s): HGBA1C in the last 72 hours. CBG:  Recent Labs Lab 09/27/16 1646 09/27/16 2134 09/28/16 0826 09/28/16 1155  GLUCAP 103* 199* 99 255*   Lipid Profile:  Recent Labs  09/27/16 1643  CHOL 214*  HDL 28*  LDLCALC 150*  TRIG 181*  CHOLHDL 7.6   Thyroid Function Tests: No results for input(s): TSH, T4TOTAL, FREET4, T3FREE, THYROIDAB in the last 72 hours. Anemia Panel: No results for input(s): VITAMINB12, FOLATE, FERRITIN, TIBC, IRON, RETICCTPCT in the last 72 hours. Urine analysis:    Component Value Date/Time   COLORURINE AMBER (A) 09/27/2016 1143   APPEARANCEUR HAZY (A) 09/27/2016 1143   LABSPEC 1.016 09/27/2016 1143   PHURINE 5.0 09/27/2016 1143   GLUCOSEU 50 (A) 09/27/2016 1143   HGBUR NEGATIVE 09/27/2016 1143   BILIRUBINUR NEGATIVE 09/27/2016 1143   BILIRUBINUR Negative 12/31/2015 1310   KETONESUR NEGATIVE 09/27/2016 1143   PROTEINUR 100 (A) 09/27/2016 1143   UROBILINOGEN 0.2 12/31/2015 1310   UROBILINOGEN 4.0 (H) 05/29/2007 1609   NITRITE NEGATIVE 09/27/2016 1143   LEUKOCYTESUR NEGATIVE 09/27/2016 1143     RAI,RIPUDEEP M.D. [facility] 09/28/2016, 12:50 PM  Pager: [phone] Between 7am to 7pm - call Pager - [phone]  After 7pm go to [email or web address] - password [email or web address]  Call night coverage person covering after 7pm

## 2016-09-28 NOTE — Consult Note (Addendum)
   PODIATRY CONSULT Willie Jordan, is a 51 y.o. male, DOB - 1965/12/26, XNA:[id number] Admit date - 09/27/2016   Admitting Physician Maren Reamer, MD Outpatient Primary MD for the patient is [facility], MD  REASON FOR CONSULT: ABSCESS LEFT FOOT  Brief Narrative: 51 year old male with a history of diabetes mellitus type-II presents today as an inpatient for evaluation of an abscess with cellulitis to the left foot. Patient states that the infection is only been present for approximately 1 week. Patient presented to the emergency department on 09/27/2016 for evaluation at which time he was admitted and podiatry was consultative.   Objective/Physical Exam General: The patient is alert and oriented x3 in no acute distress. Dermatology: Skin is very warm to touch compared to the contralateral limb. Draining ulcer noted to the first interdigital webspace between digits 1-2 left foot as well as a chronic ulcer noted to the plantar aspect of the second MPJ left foot. There is purulent drainage noted to the interdigital ulcer. Vascular: Palpable pedal pulses bilaterally. Edema noted diffusely throughout the left forefoot extending proximal along the neurovascular bundles and tendon sheaths.  Musculoskeletal Exam: Range of motion within normal limits to all pedal and ankle joints bilateral. Muscle strength 5/5 in all groups bilateral.  Radiographic Exam:  there is gas accumulation soft tissues compatible with cellulitis based on radiographic exam taken while inpatient. MRI findings are consistent with osteomyelitis throughout the second ray of the left foot.    Assessment: 1.  diabetes mellitus w/ polyneuropathy 2. Cellulitis and abscess left foot 3. Osteomyelitis second ray left foot    Plan of Care:  Patient was evaluated at bedside today. The patient requires timely surgical intervention to prevent more proximal amputation. I explained to the patient that he does require surgery and he  understands. All patient questions were answered. No guarantees were expressed or implied. Surgery added for tomorrow, 09/29/2016, at 5 PM for amputation of the second ray left foot with  incision and drainage of cellulitis left foot. I explained to the patient in detail that this procedure is in an attempt to prevent transmetatarsal amputation or more proximal possible below-knee amputation. Continue IV antibiotics as per internal medicine. NPO after breakfast after 9 AM. The patient may require another return to the operating room while inpatient for delayed primary closure. I will continue to follow while inpatient. Thank you for the consult. Please call me directly with any questions or concerns. Mobile# [phone]   Edrick Kins, DPM [facility]  Dr. Edrick Kins, [address]                                        [place], [place] [address]                Office [phone]  Fax [phone]

## 2016-09-29 ENCOUNTER — Encounter (HOSPITAL_COMMUNITY): Payer: Self-pay | Admitting: Certified Registered Nurse Anesthetist

## 2016-09-29 ENCOUNTER — Inpatient Hospital Stay (HOSPITAL_COMMUNITY): Payer: Self-pay | Admitting: Certified Registered Nurse Anesthetist

## 2016-09-29 ENCOUNTER — Inpatient Hospital Stay (HOSPITAL_COMMUNITY): Payer: Self-pay

## 2016-09-29 ENCOUNTER — Encounter (HOSPITAL_COMMUNITY)
Admission: EM | Disposition: A | Discharge status: Home Health | Payer: Self-pay | Source: Home / Self Care | Attending: Internal Medicine

## 2016-09-29 DIAGNOSIS — M869 Osteomyelitis, unspecified: Secondary | ICD-10-CM

## 2016-09-29 HISTORY — PX: AMPUTATION: SHX166

## 2016-09-29 LAB — AEROBIC CULTURE W GRAM STAIN (SUPERFICIAL SPECIMEN): Culture: NORMAL

## 2016-09-29 LAB — GLUCOSE, CAPILLARY
GLUCOSE-CAPILLARY: 168 mg/dL — AB (ref 65–99)
GLUCOSE-CAPILLARY: 86 mg/dL (ref 65–99)
GLUCOSE-CAPILLARY: 121 mg/dL — AB (ref 65–99)
Glucose-Capillary: 169 mg/dL — ABNORMAL HIGH (ref 65–99)
Glucose-Capillary: 129 mg/dL — ABNORMAL HIGH (ref 65–99)

## 2016-09-29 LAB — BASIC METABOLIC PANEL
Anion gap: 8 (ref 5–15)
BUN: 8 mg/dL (ref 6–20)
CHLORIDE: 97 mmol/L — AB (ref 101–111)
CO2: 32 mmol/L (ref 22–32)
CREATININE: 0.78 mg/dL (ref 0.61–1.24)
Calcium: 8.3 mg/dL — ABNORMAL LOW (ref 8.9–10.3)
GFR calc Af Amer: >60 mL/min (ref >60)
GFR calc non Af Amer: >60 mL/min (ref >60)
GLUCOSE: 174 mg/dL — AB (ref 65–99)
Potassium: 3.7 mmol/L (ref 3.5–5.1)
Sodium: 137 mmol/L (ref 135–145)

## 2016-09-29 LAB — CBC
HCT: 29.4 % — ABNORMAL LOW (ref 39.0–52.0)
Hemoglobin: 9.7 g/dL — ABNORMAL LOW (ref 13.0–17.0)
MCH: 26.9 pg (ref 26.0–34.0)
MCHC: 33 g/dL (ref 30.0–36.0)
MCV: 81.7 fL (ref 78.0–100.0)
PLATELETS: 334 10*3/uL (ref 150–400)
RBC: 3.6 MIL/uL — ABNORMAL LOW (ref 4.22–5.81)
RDW: 11.5 % (ref 11.5–15.5)
WBC: 9.2 10*3/uL (ref 4.0–10.5)

## 2016-09-29 LAB — SURGICAL PCR SCREEN
MRSA, PCR: NEGATIVE
Staphylococcus aureus: NEGATIVE

## 2016-09-29 SURGERY — AMPUTATION, FOOT, RAY
Anesthesia: General | Site: Foot | Laterality: Left

## 2016-09-29 MED ORDER — FENTANYL CITRATE (PF) 100 MCG/2ML IJ SOLN
INTRAMUSCULAR | Status: DC | PRN
  Administered 2016-09-29: 50 ug via INTRAVENOUS

## 2016-09-29 MED ORDER — PROPOFOL 10 MG/ML IV BOLUS
INTRAVENOUS | Status: AC
  Filled 2016-09-29: qty 20

## 2016-09-29 MED ORDER — 0.9 % SODIUM CHLORIDE (POUR BTL) OPTIME
TOPICAL | Status: DC | PRN
  Administered 2016-09-29: 1000 mL

## 2016-09-29 MED ORDER — SODIUM CHLORIDE 0.9 % IR SOLN
Status: DC | PRN
  Administered 2016-09-29: 3000 mL

## 2016-09-29 MED ORDER — FENTANYL CITRATE (PF) 100 MCG/2ML IJ SOLN
INTRAMUSCULAR | Status: AC
  Filled 2016-09-29: qty 2

## 2016-09-29 MED ORDER — PROPOFOL 10 MG/ML IV BOLUS
INTRAVENOUS | Status: DC | PRN
  Administered 2016-09-29: 200 mg via INTRAVENOUS

## 2016-09-29 MED ORDER — FENTANYL CITRATE (PF) 100 MCG/2ML IJ SOLN: 25.0000 ug | INTRAMUSCULAR | Status: DC | PRN

## 2016-09-29 MED ORDER — BUPIVACAINE HCL (PF) 0.5 % IJ SOLN
INTRAMUSCULAR | Status: AC
  Filled 2016-09-29: qty 10

## 2016-09-29 MED ORDER — PHENYLEPHRINE HCL 10 MG/ML IJ SOLN
INTRAMUSCULAR | Status: DC | PRN
  Administered 2016-09-29: 120 ug via INTRAVENOUS
  Administered 2016-09-29 (×3): 80 ug via INTRAVENOUS
  Administered 2016-09-29: 40 ug via INTRAVENOUS

## 2016-09-29 MED ORDER — OXYCODONE HCL 5 MG/5ML PO SOLN: 5.0000 mg | Freq: Once | ORAL | Status: DC | PRN

## 2016-09-29 MED ORDER — LACTATED RINGERS IV SOLN
INTRAVENOUS | Status: DC | PRN
  Administered 2016-09-29 (×2): via INTRAVENOUS

## 2016-09-29 MED ORDER — BUPIVACAINE HCL (PF) 0.5 % IJ SOLN
INTRAMUSCULAR | Status: DC | PRN
  Administered 2016-09-29: 10 mL

## 2016-09-29 MED ORDER — LIDOCAINE HCL 2 % IJ SOLN
INTRAMUSCULAR | Status: DC | PRN
  Administered 2016-09-29: 10 mL

## 2016-09-29 MED ORDER — OXYCODONE-ACETAMINOPHEN 7.5-325 MG PO TABS
1.0000 | ORAL_TABLET | Freq: Four times a day (QID) | ORAL | Status: DC | PRN
  Administered 2016-09-29 – 2016-10-04 (×3): 1 via ORAL
  Filled 2016-09-29 (×3): qty 1

## 2016-09-29 MED ORDER — OXYCODONE HCL 5 MG PO TABS: 5.0000 mg | ORAL_TABLET | Freq: Once | ORAL | Status: DC | PRN

## 2016-09-29 MED ORDER — MIDAZOLAM HCL 5 MG/5ML IJ SOLN
INTRAMUSCULAR | Status: DC | PRN
  Administered 2016-09-29: 2 mg via INTRAVENOUS

## 2016-09-29 MED ORDER — LIDOCAINE HCL 2 % IJ SOLN
INTRAMUSCULAR | Status: AC
  Filled 2016-09-29: qty 20

## 2016-09-29 MED ORDER — CHLORHEXIDINE GLUCONATE CLOTH 2 % EX PADS
6.0000 | MEDICATED_PAD | Freq: Every day | CUTANEOUS | Status: DC
  Administered 2016-09-29 – 2016-10-01 (×2): 6 via TOPICAL

## 2016-09-29 MED ORDER — MIDAZOLAM HCL 2 MG/2ML IJ SOLN
INTRAMUSCULAR | Status: AC
  Filled 2016-09-29: qty 2

## 2016-09-29 SURGICAL SUPPLY — 50 items
BANDAGE ACE 4X5 VEL STRL LF (GAUZE/BANDAGES/DRESSINGS) ×3 IMPLANT
BLADE AVERAGE 25MMX9MM (BLADE)
BLADE AVERAGE 25X9 (BLADE) ×1 IMPLANT
BLADE LONG MED 31MMX9MM (MISCELLANEOUS) ×1
BLADE LONG MED 31X9 (MISCELLANEOUS) ×1 IMPLANT
BLADE SURG 15 STRL LF DISP TIS (BLADE) IMPLANT
BLADE SURG 15 STRL SS (BLADE)
BNDG GAUZE ELAST 4 BULKY (GAUZE/BANDAGES/DRESSINGS) ×3 IMPLANT
CHLORAPREP W/TINT 26ML (MISCELLANEOUS) IMPLANT
COVER SURGICAL LIGHT HANDLE (MISCELLANEOUS) ×3 IMPLANT
CUFF TOURNIQUET SINGLE 18IN (TOURNIQUET CUFF) ×3 IMPLANT
CUFF TOURNIQUET SINGLE 24IN (TOURNIQUET CUFF) IMPLANT
DRSG PAD ABDOMINAL 8X10 ST (GAUZE/BANDAGES/DRESSINGS) IMPLANT
ELECT REM PT RETURN 9FT ADLT (ELECTROSURGICAL)
ELECTRODE REM PT RTRN 9FT ADLT (ELECTROSURGICAL) IMPLANT
GAUZE IODOFORM PACK 1/2 7832 (GAUZE/BANDAGES/DRESSINGS) ×2 IMPLANT
GAUZE PACKING FOLDED 1/2 STR (GAUZE/BANDAGES/DRESSINGS) ×3 IMPLANT
GAUZE SPONGE 4X4 12PLY STRL (GAUZE/BANDAGES/DRESSINGS) ×1 IMPLANT
GAUZE SPONGE 4X4 16PLY XRAY LF (GAUZE/BANDAGES/DRESSINGS) ×6 IMPLANT
GAUZE XEROFORM 1X8 LF (GAUZE/BANDAGES/DRESSINGS) ×1 IMPLANT
GLOVE BIO SURGEON STRL SZ8 (GLOVE) ×3 IMPLANT
GLOVE BIOGEL PI IND STRL 8 (GLOVE) ×1 IMPLANT
GLOVE BIOGEL PI INDICATOR 8 (GLOVE) ×2
GLOVE INDICATOR 8.5 STRL (GLOVE) ×2 IMPLANT
GLOVE SURG SS PI 6.5 STRL IVOR (GLOVE) ×2 IMPLANT
GOWN STRL REUS W/ TWL LRG LVL3 (GOWN DISPOSABLE) ×2 IMPLANT
GOWN STRL REUS W/TWL LRG LVL3 (GOWN DISPOSABLE) ×6
HANDPIECE INTERPULSE COAX TIP (DISPOSABLE) ×3
KIT BASIN OR (CUSTOM PROCEDURE TRAY) ×3 IMPLANT
KIT ROOM TURNOVER OR (KITS) ×3 IMPLANT
NEEDLE 27GAX1X1/2 (NEEDLE) ×1 IMPLANT
NS IRRIG 1000ML POUR BTL (IV SOLUTION) ×3 IMPLANT
PACK ORTHO EXTREMITY (CUSTOM PROCEDURE TRAY) ×3 IMPLANT
PAD ARMBOARD 7.5X6 YLW CONV (MISCELLANEOUS) ×6 IMPLANT
PAD CAST 4YDX4 CTTN HI CHSV (CAST SUPPLIES) ×1 IMPLANT
PADDING CAST COTTON 4X4 STRL (CAST SUPPLIES)
SCRUB BETADINE 4OZ XXX (MISCELLANEOUS) IMPLANT
SET HNDPC FAN SPRY TIP SCT (DISPOSABLE) IMPLANT
SOLUTION BETADINE 4OZ (MISCELLANEOUS) IMPLANT
STAPLER VISISTAT 35W (STAPLE) ×2 IMPLANT
SUT PROLENE 3 0 PS 1 (SUTURE) ×3 IMPLANT
SUT PROLENE 4 0 PS 2 18 (SUTURE) ×1 IMPLANT
SUT VIC AB 3-0 PS2 18 (SUTURE) ×1 IMPLANT
SUT VICRYL 4-0 PS2 18IN ABS (SUTURE) ×1 IMPLANT
SYR CONTROL 10ML LL (SYRINGE) ×1 IMPLANT
TOWEL OR 17X24 6PK STRL BLUE (TOWEL DISPOSABLE) ×1 IMPLANT
TOWEL OR 17X26 10 PK STRL BLUE (TOWEL DISPOSABLE) ×1 IMPLANT
TUBE CONNECTING 12'X1/4 (SUCTIONS) ×1
TUBE CONNECTING 12X1/4 (SUCTIONS) ×2 IMPLANT
YANKAUER SUCT BULB TIP NO VENT (SUCTIONS) IMPLANT

## 2016-09-29 NOTE — Anesthesia Procedure Notes (Signed)
Procedure Name: LMA Insertion Date/Time: 09/29/2016 5:53 PM Performed by: Shirlyn Goltz Pre-anesthesia Checklist: Patient identified, Emergency Drugs available, Suction available and Patient being monitored Patient Re-evaluated:Patient Re-evaluated prior to inductionOxygen Delivery Method: Circle system utilized Preoxygenation: Pre-oxygenation with 100% oxygen Intubation Type: IV induction Ventilation: Mask ventilation without difficulty LMA: LMA inserted LMA Size: 4.0 Number of attempts: 1 Placement Confirmation: positive ETCO2 and breath sounds checked- equal and bilateral Tube secured with: Tape Dental Injury: Teeth and Oropharynx as per pre-operative assessment

## 2016-09-29 NOTE — Progress Notes (Signed)
Orthopedic Tech Progress Note Patient Details:  Willie Jordan 02-May-1966 [id number]  Ortho Devices Type of Ortho Device: Postop shoe/boot Ortho Device/Splint Location: LLE Ortho Device/Splint Interventions: Ordered, Application   Braulio Bosch 09/29/2016, 9:03 PM

## 2016-09-29 NOTE — H&P (View-Only) (Signed)
   PODIATRY CONSULT Willie Jordan, is a 51 y.o. male, DOB - May 06, 1966, VZS:[id number] Admit date - 09/27/2016   Admitting Physician Maren Reamer, MD Outpatient Primary MD for the patient is [facility], MD  REASON FOR CONSULT: ABSCESS LEFT FOOT  Brief Narrative: 51 year old male with a history of diabetes mellitus type-II presents today as an inpatient for evaluation of an abscess with cellulitis to the left foot. Patient states that the infection is only been present for approximately 1 week. Patient presented to the emergency department on 09/27/2016 for evaluation at which time he was admitted and podiatry was consultative.   Objective/Physical Exam General: The patient is alert and oriented x3 in no acute distress. Dermatology: Skin is very warm to touch compared to the contralateral limb. Draining ulcer noted to the first interdigital webspace between digits 1-2 left foot as well as a chronic ulcer noted to the plantar aspect of the second MPJ left foot. There is purulent drainage noted to the interdigital ulcer. Vascular: Palpable pedal pulses bilaterally. Edema noted diffusely throughout the left forefoot extending proximal along the neurovascular bundles and tendon sheaths.  Musculoskeletal Exam: Range of motion within normal limits to all pedal and ankle joints bilateral. Muscle strength 5/5 in all groups bilateral.  Radiographic Exam:  there is gas accumulation soft tissues compatible with cellulitis based on radiographic exam taken while inpatient. MRI findings are consistent with osteomyelitis throughout the second ray of the left foot.    Assessment: 1.  diabetes mellitus w/ polyneuropathy 2. Cellulitis and abscess left foot 3. Osteomyelitis second ray left foot    Plan of Care:  Patient was evaluated at bedside today. The patient requires timely surgical intervention to prevent more proximal amputation. I explained to the patient that he does require surgery and he  understands. All patient questions were answered. No guarantees were expressed or implied. Surgery added for tomorrow, 09/29/2016, at 5 PM for amputation of the second ray left foot with  incision and drainage of cellulitis left foot. I explained to the patient in detail that this procedure is in an attempt to prevent transmetatarsal amputation or more proximal possible below-knee amputation. Continue IV antibiotics as per internal medicine. NPO after breakfast after 9 AM. The patient may require another return to the operating room while inpatient for delayed primary closure. I will continue to follow while inpatient. Thank you for the consult. Please call me directly with any questions or concerns. Mobile# [phone]   Edrick Kins, DPM [facility]  Dr. Edrick Kins, [address]                                        [place], [place] [address]                Office [phone]  Fax [phone]

## 2016-09-29 NOTE — Anesthesia Postprocedure Evaluation (Addendum)
Anesthesia Post Note  Patient: Willie Jordan  Procedure(s) Performed: Procedure(s) (LRB): 2nd RAY AMPUTATION LEFT FOOT I&D LEFT FOOT (Left)  Patient location during evaluation: PACU Anesthesia Type: General Level of consciousness: awake, awake and alert and oriented Pain management: pain level controlled Vital Signs Assessment: post-procedure vital signs reviewed and stable Respiratory status: spontaneous breathing, nonlabored ventilation and respiratory function stable Cardiovascular status: blood pressure returned to baseline Anesthetic complications: no       Last Vitals:  Vitals:   09/29/16 2032 09/29/16 2033  BP: (!) 177/89   Pulse: 74   Resp: 16   Temp:  36.5 C    Last Pain:  Vitals:   09/29/16 2033  TempSrc:   PainSc: 0-No pain                 Syd Newsome COKER

## 2016-09-29 NOTE — Op Note (Signed)
09/29/2016 @ 7:38 PM  PATIENT:  Willie Jordan  51 y.o. male  PRE-OPERATIVE DIAGNOSIS:   osteomyelitis second ray left foot. Deep tissue abscess left foot. Cellulitis left foot.  POST-OPERATIVE DIAGNOSIS:   same   PROCEDURE:  Procedure(s): 1. 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 I&D LEFT FOOT  SURGEON:  Surgeon(s) and Role:    * Edrick Kins, DPM - Primary  PHYSICIAN ASSISTANT: None  ASSISTANTS: none   ANESTHESIA:    50-50 mixture of 2% lidocaine plain with 0.5% Marcaine plain totaling 20 mL infiltrated in the patient's left lower extremity in an ankle block fashion  EBL:   10 mL  BLOOD ADMINISTERED: none  DRAINS: none   LOCAL MEDICATIONS USED:  MARCAINE    and LIDOCAINE   SPECIMEN:  Source of Specimen:  Deep wound culture left foot abscess  DISPOSITION OF SPECIMEN:  N/A  COUNTS:  YES  TOURNIQUET:   left thigh tourniquet inflated to a pressure of 300 mgHg. No esmarch exsanguination.   Total Tourniquet Time Documented: Thigh (Left) - 42 minutes Total: Thigh (Left) - 42 minutes  JUSTIFICATION FOR PROCEDURE: Mr. Clymer is a 51 year old male with a history of diabetes mellitus who presented to the [facility] emergency department on 09/27/2016 for evaluation of an infection with abscess to the left foot. At that time the patient was admitted, placed on IV antibiotic therapy, and podiatry consulted. MRI findings are consistent with osteomyelitis of the second ray left foot. After evaluating the patient, it was decided that the patient will require partial second ray amputation as well as incision and drainage in an attempt to salvage the left foot. The patient was told benefits as well as possible side effects the surgery. The patient consented for surgical correction. The patient consent form was reviewed. All patient questions were answered. No guarantees were expressed or implied. The patient and the surgeon boson the patient consent form with the witness  present and placed in the patient's chart.   PROCEDURE IN DETAIL: The patient was brought to the operating room placed the operating table in supine position at which time an aseptic scrub and drape were performed about the patient's left lower extremity after anesthesia was induced as described above. The left thigh tourniquet was inflated to a pressure of 3 mg mercury and attention was directed to the left foot second digit where procedure 1 commenced.  Procedure #1: Partial second ray amputation left foot A racquet type of incision was planned and made about the metatarsal phalangeal joint of the second digit right foot. The incision was carried down to the level of bone with care taken to cut clamped ligated or retracted way all small neurovascular structures traversing the incision site. An osteotomy was performed at the proximal portion of the second metatarsal and the second digit as well as distal aspect of the metatarsal was removed in toto and placing sterile specimen container. All necrotic nonviable tissue was sharply dissected from within the incision site as far proximal as could be visualized. All tendons which were visualized within the incision site were also distracted distally and cut as far proximal as could be visualized.   Procedure #2: Incision and drainage left foot Within the incision site previously mentioned in procedure #1, blunt dissection was utilized to perforate all deep tissue abscesses within the foot. The left foot was expressed and excessive purulent drainage was noted. There also gas bubbles noted within the deep compartments of the left foot. Proximal tracking  of the abscess indicated that the infection was traveling along the medial neurovascular structures of the foot and ankle. The intraoperative decision was made to create the second incision site extending along the posterior tibial tendon. The incision was approximate 5 cm long using an Ollier-type incision. The  incision was carried down to the level of the muscular and deep compartment tissue with care taken to cut clamped ligated or retracted way all small neurovascular structures traversing the incision site. At this time 3 L of normal saline were utilized to irrigate the incision sites using pulse lavage. Deep wound cultures were taken and sent to pathology for aerobic and anaerobic gram stain.  All incision sites were then prepared for partial soft tissue closure. Stainless steel skin staples were utilized to reapproximate superficial skin edges of the medial ankle incision site. 3-0 Prolene suture was utilized to reapproximate the superficial skin edges of the second ray amputation site. The central portion of both incision sites were left open and 1/2" Iodoform dressing was packed into the incision sites.  The foot was then dressed with dry sterile dressing. The left thigh tourniquet which was used for hemostasis was deflated. All normal neurovascular responses including pink color and warmth returned all the digits of the patient's left lower extremity. The patient was then transferred from the operating room to the recovery room having tolerated the procedure and anesthesia well. All vital signs are stable. After a brief stay in the recovery room the patient was readmitted to his room with prescriptions for adequate analgesic and anti-inflammatory care for in hospital use. Verbal as well as written instructions were provided for the patient regarding wound care. The patient is to keep the dressings clean dry and intact until he is to follow surgeon Dr. Daylene Katayama in the office upon discharge. Also the patient is to be strictly nonweightbearing in a postoperative shoe.  IMPRESSION: Intraoperatively and from a surgical standpoint there was an extensive amount of purulent drainage with gas noted within the tissues. Patient may require additional and more proximal surgery/amputation while inpatient. Next  amputation level may be a transmetatarsal amputation. I will continue to follow while inpatient to determine the patient's progression over the next few days.  PLAN OF CARE: Admit to inpatient   PATIENT DISPOSITION:  PACU - hemodynamically stable.   Delay start of Pharmacological VTE agent (>24hrs) due to surgical blood loss or risk of bleeding: not applicable  Edrick Kins, DPM [facility]  Dr. Edrick Kins, [address]                                             [place], [place] [address]                Office [phone]  Fax [phone]

## 2016-09-29 NOTE — Progress Notes (Signed)
[facility]                                                                              Patient Demographics  Willie Jordan, is a 51 y.o. male, DOB - 21-Jun-1966, KCM:[id number]  Admit date - 09/27/2016   Admitting Physician Maren Reamer, MD  Outpatient Primary MD for the patient is [facility], MD  Outpatient specialists:   LOS - 2  days    Chief Complaint  Patient presents with  . Wound Infection       Brief summary  Willie Jordan is a 51 y.o. male with medical history significant of HTN, hyperlipidemia, neuropathy, diabetes mellitus type 2 who presented to ED with left foot infected wound.Patient reported that on March 16th he came back from work and noticed the redness and swelling over the left foot. On March 19-th, he was seen at community clinic, had IM injection of ceftriaxone daily for 3 days. On March 20 patient underwent x-ray that showed no abscess, but gas accumulation in the soft tissues compatible with cellulitis. Patient was placed on oral Augmentin 875/125 mg twice a day on March 22-nd after he completed the course of IM Rocephin. The following day in the office was noted that he developed some purulent drainage from the new opening between the left great and second toe left and area was debrided. He was seen in the [place] for follow-up and sent to ED for increased swelling erythema and increased drainage from the wound    Assessment & Plan    Principal Problem: Sepsis associated with diabetic foot infection - Patient placed on IV vancomycin and cefepime, IV fluids, pain control  - Follow intraoperative cultures, blood cultures - MRI of the left foot showed large complex fluid collection in the forefoot extending into the second web space into the dorsal and plantar aspects of the foot consistent with an abscess, proximal extension to the level of the mid metatarsal shafts. Associated synovitis of the second  metatarsophalangeal joint consistent with septic joint and complex post and second right tenosynovitis. Also osteomyelitis of second proximal phalanx, early cellulitis of the second metatarsal head and neck.     Active problems Diabetic foot infection with abscess and osteomyelitis - Continue broad spectrum antibiotics- vancomycin and cefepime - Wound care consult to address wound dressings - NPO, plan for surgery today  Diabetes mellitus with diabetic neuropathy - obtain hemoglobin A1c - Hold Glucophage, - Continue sliding scale insulin while inpatient   Hypertension  - Currently stable, continue HCTZ, lisinopril  Hyperlipidemia - not treated - LDL 150, cholesterol 214, placed on Lipitor 20 mg daily  Code Status: full code  DVT Prophylaxis:  Lovenox  Family Communication: Discussed in detail with the patient, all imaging results, lab results explained to the patient   Disposition Plan: Awaiting surgery  Time Spent in minutes   25 minutes  Procedures:    Consultants:   Podiatry  Antimicrobials:   IV vancomycin  IV cefepime   Medications  Scheduled Meds: . atorvastatin  20 mg Oral q1800  . ceFEPime (MAXIPIME) IV  1 g Intravenous Q8H  . Chlorhexidine  Gluconate Cloth  6 each Topical [id number]  . enoxaparin (LOVENOX) injection  40 mg Subcutaneous Q24H  . gabapentin  300 mg Oral QHS  . hydrochlorothiazide  25 mg Oral Q breakfast  . insulin aspart  0-9 Units Subcutaneous TID WC  . insulin aspart protamine- aspart  18 Units Subcutaneous BID WC  . lisinopril  2.5 mg Oral Daily  . vancomycin  750 mg Intravenous Q8H   Continuous Infusions: PRN Meds:.acetaminophen **OR** acetaminophen, HYDROcodone-acetaminophen, ondansetron **OR** ondansetron (ZOFRAN) IV, polyethylene glycol, traZODone   Antibiotics   Anti-infectives    Start     Dose/Rate Route Frequency Ordered Stop   09/27/16 1600  vancomycin (VANCOCIN) IVPB 750 mg/150 ml premix     750 mg 150 mL/hr over 60  Minutes Intravenous Every 8 hours 09/27/16 1556     09/27/16 1600  ceFEPIme (MAXIPIME) 1 g in dextrose 5 % 50 mL IVPB     1 g 100 mL/hr over 30 Minutes Intravenous Every 8 hours 09/27/16 1556     09/27/16 1400  metroNIDAZOLE (FLAGYL) tablet 500 mg  Status:  Discontinued     500 mg Oral Every 8 hours 09/27/16 1147 09/27/16 1550   09/27/16 1200  cefTRIAXone (ROCEPHIN) 2 g in dextrose 5 % 50 mL IVPB  Status:  Discontinued     2 g 100 mL/hr over 30 Minutes Intravenous Every 24 hours 09/27/16 1147 09/27/16 1550        Subjective:   Willie Jordan was seen and examined today. No complaints, waiting for surgery today . Patient denies dizziness, chest pain, shortness of breath, abdominal pain, N/V/D/C, new weakness, numbess, tingling. No acute events overnight.    Objective:   Vitals:   09/28/16 0445 09/28/16 1206 09/28/16 2140 09/29/16 0629  BP: 132/60 (!) 143/75 (!) 161/74 (!) 145/77  Pulse: 80 72 77 87  Resp: 17 18 19 18   Temp: 98.3 F (36.8 C) 98.8 F (37.1 C) 98.3 F (36.8 C) 98.2 F (36.8 C)  TempSrc: Oral Oral Oral Oral  SpO2: 97% 96% 96% 98%  Weight:      Height:        Intake/Output Summary (Last 24 hours) at 09/29/16 1220 Last data filed at 09/29/16 1012  Gross per 24 hour  Intake             1130 ml  Output             2250 ml  Net            -1120 ml     Wt Readings from Last 3 Encounters:  09/27/16 73.9 kg (163 lb)  09/26/16 72.1 kg (159 lb)  09/25/16 70.8 kg (156 lb)     Exam  General: Alert and oriented x 3, NAD  HEENT:  Neck: Supple, no JVD, no masses  Cardiovascular: S1 S2 auscultated, no rubs, murmurs or gallops. Regular rate and rhythm.  Respiratory: Clear to auscultation bilaterally, no wheezing, rales or rhonchi  Gastrointestinal: Soft, nontender, nondistended, + bowel sounds  Ext: no cyanosis clubbing or edema  Neuro: No new deficits  Skin: Dressing intact on the left foot  Psych: Normal affect and demeanor, alert and oriented x3     Data Reviewed:  I have personally reviewed following labs and imaging studies  Micro Results Recent Results (from the past 240 hour(s))  Culture, blood (Routine x 2)     Status: None (Preliminary result)   Collection Time: 09/27/16 11:38 AM  Result Value Ref Range Status  Specimen Description BLOOD RIGHT ANTECUBITAL  Final   Special Requests BOTTLES DRAWN AEROBIC AND ANAEROBIC 5CC  Final   Culture NO GROWTH 1 DAY  Final   Report Status PENDING  Incomplete  Wound or Superficial Culture     Status: None (Preliminary result)   Collection Time: 09/27/16 11:43 AM  Result Value Ref Range Status   Specimen Description WOUND  Final   Special Requests LEFT FOOT  Final   Gram Stain   Final    ABUNDANT WBC PRESENT, PREDOMINANTLY PMN FEW GRAM POSITIVE COCCI IN CLUSTERS FEW GRAM POSITIVE COCCI IN PAIRS    Culture CULTURE REINCUBATED FOR BETTER GROWTH  Final   Report Status PENDING  Incomplete  Culture, blood (Routine x 2)     Status: None (Preliminary result)   Collection Time: 09/27/16 11:44 AM  Result Value Ref Range Status   Specimen Description BLOOD RIGHT HAND  Final   Special Requests BOTTLES DRAWN AEROBIC AND ANAEROBIC 5CC  Final   Culture NO GROWTH 1 DAY  Final   Report Status PENDING  Incomplete  Surgical pcr screen     Status: None   Collection Time: 09/29/16  1:04 AM  Result Value Ref Range Status   MRSA, PCR NEGATIVE NEGATIVE Final   Staphylococcus aureus NEGATIVE NEGATIVE Final    Comment:        The Xpert SA Assay (FDA approved for NASAL specimens in patients over 7 years of age), is one component of a comprehensive surveillance program.  Test performance has been validated by [facility] for patients greater than or equal to 72 year old. It is not intended to diagnose infection nor to guide or monitor treatment.     Radiology Reports Mr Foot Left W Wo Contrast  Addendum Date: 09/28/2016   ADDENDUM REPORT: 09/28/2016 09:50 ADDENDUM: These results were  called by telephone at the time of interpretation on 09/28/2016 at 9:49 am to Dr. York Grice , who verbally acknowledged these results. Timely orthopedic consultation recommended. Electronically Signed   By: Richardean Sale M.D.   On: 09/28/2016 09:50   Result Date: 09/28/2016 CLINICAL DATA:  Diabetic with nonhealing wound dorsally between the first and second toes. History of neuropathy. Sepsis. EXAM: MRI OF THE LEFT FOREFOOT WITHOUT AND WITH CONTRAST TECHNIQUE: Multiplanar, multisequence MR imaging of the left forefoot was performed both before and after administration of intravenous contrast. CONTRAST:  73mL MULTIHANCE GADOBENATE DIMEGLUMINE 529 MG/ML IV SOLN COMPARISON:  Radiographs 09/27/2016. FINDINGS: Bones/Joint/Cartilage There is a large ill-defined, complex fluid collection within the first web space. This collection is best defined on the post-contrast images. There is relatively limited dorsal extension along the distal half of the second metatarsal shaft and second proximal phalanx. There is more extensive plantar extension along the first and second metatarsals and proximal phalanges. There are multiple foci of low signal within these fluid collections, corresponding with soft tissue emphysema on the patient's radiographs. There is irregular enhancement of these collections following contrast. Approximate overall dimensions are 6.2 x 4.2 x 4.3 cm. There is probable extension to the skin dorsally at the first web space. The second proximal phalanx demonstrates abnormally decreased T1 and increased T2 signal with cortical irregularity highly suspicious for osteomyelitis. There is diffuse marrow enhancement following contrast. There is also T2 hyperintensity and enhancement within the second metatarsal head and neck which could reflect osteomyelitis. There is synovial enhancement of the second metatarsal phalangeal joint. There is some low-level T2 hyperintensity and enhancement within the first  toe phalanges and the sesamoids of the first metatarsal. There is no associated abnormal T1 signal or cortical destruction. There is no suspicious signal in the first metatarsal or the third through fifth rays. Ligaments The Lisfranc ligament is intact. Muscles and Tendons The complex fluid collections described above partially envelope the flexor tendons of the first and second toes and the extensor tendon of the second toe at the level of the metatarsals. This is worrisome for infectious tenosynovitis. There is extensive T2 hyperintensity and low level enhancement throughout the forefoot musculature. Soft tissues As above, there is a complex fluid collection extending both dorsal and plantar from the first web space consistent with an abscess. In addition, there is generalized subcutaneous edema throughout the forefoot consistent with cellulitis. IMPRESSION: 1. Large complex fluid collection medially in the forefoot, extending from the second web space into the dorsal and plantar aspects of the foot, consistent with an abscess. There is proximal extension to the level of the mid metatarsal shafts. 2. Associated synovitis of the second metatarsal phalangeal joint consistent with septic joint, and complex first and second ray tenosynovitis worrisome for infectious tenosynovitis. 3. Findings are highly worrisome for osteomyelitis of the second proximal phalanx. There is also probable early osteomyelitis of the second metatarsal head and neck. Less specific marrow changes within the phalanges of the great toe may be reactive, although early osteomyelitis difficult to exclude. 4. Generalized forefoot cellulitis. Electronically Signed: By: Richardean Sale M.D. On: 09/28/2016 09:31   Dg Chest Port 1 View  Result Date: 09/27/2016 CLINICAL DATA:  Osteomyelitis EXAM: PORTABLE CHEST 1 VIEW COMPARISON:  05/29/2007 FINDINGS: Cardiomediastinal silhouette is unremarkable. No infiltrate or pleural effusion. No pulmonary  edema. Mild mid thoracic dextroscoliosis. IMPRESSION: No active disease. Electronically Signed   By: Lahoma Crocker M.D.   On: 09/27/2016 16:26   Dg Foot Complete Left  Result Date: 09/27/2016 CLINICAL DATA:  51 year old male with a history of foot infection EXAM: LEFT FOOT - COMPLETE 3+ VIEW COMPARISON:  09/23/2016 FINDINGS: Persisting subcutaneous/ myofacial gas of the forefoot, between the first and second digits and second and third digits, as well as extension between the first and second metatarsals on the AP view. Erosive changes at the base of the second proximal phalanx again demonstrated. Degenerative changes of the hindfoot and midfoot. IMPRESSION: Re- demonstration of subcutaneous/myofacial gas involving the left forefoot, as above, compatible with infection. Although the gas has not progressed significantly from the comparison plain film, necrotizing fasciitis cannot be excluded radiographically. Re- demonstration of potential osteomyelitis involving the base of the second proximal phalanx. Electronically Signed   By: Corrie Mckusick D.O.   On: 09/27/2016 12:28   Dg Foot Complete Left  Result Date: 09/23/2016 CLINICAL DATA:  Diabetic neuropathy, cellulitis, infection EXAM: LEFT FOOT - COMPLETE 3+ VIEW COMPARISON:  Unavailable FINDINGS: Soft tissue swelling and soft tissue air present of the left foot along the first and second toes. Soft tissue air extends along the dorsum of the foot and the plantar surface on the lateral view compatible with localized cellulitis from a gas-forming organism. No acute fracture or malalignment. No radiopaque or metallic foreign body. On the oblique view, there is minimal osseous irregularity of the second toe proximal phalanx at the second MTP joint suspicious for early osteomyelitis, best appreciated on the oblique view. IMPRESSION: Left foot soft tissue swelling and soft tissue gas along the first and second toes but extending on the dorsum of the foot and plantar  surface on the lateral  view. Findings compatible with localized cellulitis as detailed above. Subtle osseous irregularity of the left second toe proximal phalanx at the second MTP joint suspicious for early osteomyelitis. No radiopaque foreign body. Electronically Signed   By: Jerilynn Mages.  Shick M.D.   On: 09/23/2016 16:51    Lab Data:  CBC:  Recent Labs Lab 09/22/16 1234 09/27/16 1146 09/28/16 0451 09/29/16 0547  WBC 11.0* 15.7* 10.1 9.2  NEUTROABS 9.0* 13.0* 7.5  --   HGB  --  11.4* 9.2* 9.7*  HCT 37.4* 34.1* 28.3* 29.4*  MCV 83 82.2 81.8 81.7  PLT 395* 406* 325 423   Basic Metabolic Panel:  Recent Labs Lab 09/22/16 1234 09/27/16 1146 09/28/16 0451 09/29/16 0547  NA 133* 132* 138 137  K 4.4 3.9 3.5 3.7  CL 90* 93* 101 97*  CO2 28 28 29  32  GLUCOSE 326* 217* 90 174*  BUN 11 14 8 8   CREATININE 0.72* 0.83 0.63 0.78  CALCIUM 9.3 9.0 8.3* 8.3*   GFR: Estimated Creatinine Clearance: 104 mL/min (by C-G formula based on SCr of 0.78 mg/dL). Liver Function Tests:  Recent Labs Lab 09/22/16 1234 09/27/16 1146 09/28/16 0451  AST 14 25 16   ALT 17 20 16*  ALKPHOS 116 110 92  BILITOT 0.3 0.4 0.6  PROT 6.7 7.0 5.9*  ALBUMIN 3.0* 2.2* 1.7*   No results for input(s): LIPASE, AMYLASE in the last 168 hours. No results for input(s): AMMONIA in the last 168 hours. Coagulation Profile:  Recent Labs Lab 09/27/16 1643  INR 1.05   Cardiac Enzymes: No results for input(s): CKTOTAL, CKMB, CKMBINDEX, TROPONINI in the last 168 hours. BNP (last 3 results) No results for input(s): PROBNP in the last 8760 hours. HbA1C:  Recent Labs  09/27/16 1643  HGBA1C 13.2*   CBG:  Recent Labs Lab 09/28/16 1155 09/28/16 1751 09/28/16 2113 09/29/16 0747 09/29/16 1205  GLUCAP 255* 257* 217* 169* 168*   Lipid Profile:  Recent Labs  09/27/16 1643  CHOL 214*  HDL 28*  LDLCALC 150*  TRIG 181*  CHOLHDL 7.6   Thyroid Function Tests: No results for input(s): TSH, T4TOTAL, FREET4,  T3FREE, THYROIDAB in the last 72 hours. Anemia Panel: No results for input(s): VITAMINB12, FOLATE, FERRITIN, TIBC, IRON, RETICCTPCT in the last 72 hours. Urine analysis:    Component Value Date/Time   COLORURINE AMBER (A) 09/27/2016 1143   APPEARANCEUR HAZY (A) 09/27/2016 1143   LABSPEC 1.016 09/27/2016 1143   PHURINE 5.0 09/27/2016 1143   GLUCOSEU 50 (A) 09/27/2016 1143   HGBUR NEGATIVE 09/27/2016 1143   BILIRUBINUR NEGATIVE 09/27/2016 1143   BILIRUBINUR Negative 12/31/2015 1310   KETONESUR NEGATIVE 09/27/2016 1143   PROTEINUR 100 (A) 09/27/2016 1143   UROBILINOGEN 0.2 12/31/2015 1310   UROBILINOGEN 4.0 (H) 05/29/2007 1609   NITRITE NEGATIVE 09/27/2016 1143   LEUKOCYTESUR NEGATIVE 09/27/2016 1143     RAI,RIPUDEEP M.D. [facility] 09/29/2016, 12:20 PM  Pager: [phone] Between 7am to 7pm - call Pager - [phone]  After 7pm go to [email or web address] - password [email or web address]  Call night coverage person covering after 7pm

## 2016-09-29 NOTE — Brief Op Note (Signed)
09/27/2016 - 09/29/2016  7:38 PM  PATIENT:  Willie Jordan  51 y.o. male  PRE-OPERATIVE DIAGNOSIS:  infected left foot  POST-OPERATIVE DIAGNOSIS:  infected left foot   PROCEDURE:  Procedure(s): 2nd RAY AMPUTATION LEFT FOOT I&D LEFT FOOT (Left)  SURGEON:  Surgeon(s) and Role:    * Edrick Kins, DPM - Primary  PHYSICIAN ASSISTANT:   ASSISTANTS: none   ANESTHESIA:   local and general  EBL:  No intake/output data recorded.  BLOOD ADMINISTERED:none  DRAINS: none   LOCAL MEDICATIONS USED:  MARCAINE    and LIDOCAINE   SPECIMEN:  Source of Specimen:  Deep wound culture left foot abscess  DISPOSITION OF SPECIMEN:  N/A  COUNTS:  YES  TOURNIQUET:   Total Tourniquet Time Documented: Thigh (Left) - 42 minutes Total: Thigh (Left) - 42 minutes   DICTATION: .Viviann Spare Dictation  PLAN OF CARE: Admit to inpatient   PATIENT DISPOSITION:  PACU - hemodynamically stable.   Delay start of Pharmacological VTE agent (>24hrs) due to surgical blood loss or risk of bleeding: not applicable  Edrick Kins, DPM [facility]  Dr. Edrick Kins, [address]                                        [place], [place] [address]                Office [phone]  Fax [phone]

## 2016-09-29 NOTE — Anesthesia Preprocedure Evaluation (Signed)
Anesthesia Evaluation  Patient identified by MRN, date of birth, ID band Patient awake    Reviewed: Allergy & Precautions, NPO status , Patient's Chart, lab work & pertinent test results  History of Anesthesia Complications Negative for: history of anesthetic complications  Airway Mallampati: II  TM Distance: >3 FB Neck ROM: Full    Dental  (+) Poor Dentition, Missing,    Pulmonary neg shortness of breath, neg sleep apnea, neg COPD, neg recent URI, former smoker,    breath sounds clear to auscultation       Cardiovascular hypertension, Pt. on medications (-) angina(-) Past MI and (-) CHF  Rhythm:Regular     Neuro/Psych negative neurological ROS  negative psych ROS   GI/Hepatic negative GI ROS, Neg liver ROS,   Endo/Other  diabetes, Type 2, Insulin Dependent  Renal/GU negative Renal ROS     Musculoskeletal   Abdominal   Peds  Hematology negative hematology ROS (+)   Anesthesia Other Findings   Reproductive/Obstetrics                             Anesthesia Physical Anesthesia Plan  ASA: II  Anesthesia Plan: General   Post-op Pain Management:    Induction: Intravenous  Airway Management Planned: LMA  Additional Equipment: None  Intra-op Plan:   Post-operative Plan: Extubation in OR  Informed Consent: I have reviewed the patients History and Physical, chart, labs and discussed the procedure including the risks, benefits and alternatives for the proposed anesthesia with the patient or authorized representative who has indicated his/her understanding and acceptance.   Dental advisory given  Plan Discussed with: CRNA and Surgeon  Anesthesia Plan Comments:         Anesthesia Quick Evaluation

## 2016-09-29 NOTE — Transfer of Care (Signed)
Immediate Anesthesia Transfer of Care Note  Patient: Willie Jordan  Procedure(s) Performed: Procedure(s): 2nd [place] I&D LEFT FOOT (Left)  Patient Location: PACU  Anesthesia Type:General  Level of Consciousness: responds to stimulation  Airway & Oxygen Therapy: Patient Spontanous Breathing and Patient connected to nasal cannula oxygen  Post-op Assessment: Report given to RN and Post -op Vital signs reviewed and stable  Post vital signs: Reviewed and stable  Last Vitals:  Vitals:   09/29/16 1456 09/29/16 1648  BP: 137/72 (!) 157/80  Pulse: 76   Resp: 18 18  Temp: 37.8 C 37.5 C    Last Pain:  Vitals:   09/29/16 1648  TempSrc: Oral  PainSc:          Complications: No apparent anesthesia complications

## 2016-09-29 NOTE — Progress Notes (Signed)
Pt PCR negative and CHG bath for 0600 done.

## 2016-09-29 NOTE — Interval H&P Note (Signed)
History and Physical Interval Note:  09/29/2016 5:41 PM  Willie Jordan  has presented today for surgery, with the diagnosis of infected left foot  The various methods of treatment have been discussed with the patient and family. After consideration of risks, benefits and other options for treatment, the patient has consented to  Procedure(s): 2nd [place] I&D LEFT FOOT (Left) as a surgical intervention .  The patient's history has been reviewed, patient examined, no change in status, stable for surgery.  I have reviewed the patient's chart and labs.  Questions were answered to the patient's satisfaction.     Edrick Kins

## 2016-09-29 NOTE — Progress Notes (Signed)
Pt for OR today in the afternoon for amputation of the second toe on the left foot, NPO after breakfast as per Dr. Amalia Hailey, signed the consent.

## 2016-09-30 ENCOUNTER — Encounter (HOSPITAL_COMMUNITY): Payer: Self-pay | Admitting: Podiatry

## 2016-09-30 DIAGNOSIS — I10 Essential (primary) hypertension: Secondary | ICD-10-CM

## 2016-09-30 LAB — BASIC METABOLIC PANEL
ANION GAP: 8 (ref 5–15)
BUN: 8 mg/dL (ref 6–20)
CHLORIDE: 96 mmol/L — AB (ref 101–111)
CO2: 30 mmol/L (ref 22–32)
Calcium: 8 mg/dL — ABNORMAL LOW (ref 8.9–10.3)
Creatinine, Ser: 0.81 mg/dL (ref 0.61–1.24)
GFR calc Af Amer: >60 mL/min (ref >60)
GFR calc non Af Amer: >60 mL/min (ref >60)
GLUCOSE: 284 mg/dL — AB (ref 65–99)
POTASSIUM: 4.3 mmol/L (ref 3.5–5.1)
SODIUM: 134 mmol/L — AB (ref 135–145)

## 2016-09-30 LAB — CBC
HEMATOCRIT: 28.1 % — AB (ref 39.0–52.0)
HEMOGLOBIN: 9.4 g/dL — AB (ref 13.0–17.0)
MCH: 27.8 pg (ref 26.0–34.0)
MCHC: 33.5 g/dL (ref 30.0–36.0)
MCV: 83.1 fL (ref 78.0–100.0)
Platelets: 311 10*3/uL (ref 150–400)
RBC: 3.38 MIL/uL — ABNORMAL LOW (ref 4.22–5.81)
RDW: 12 % (ref 11.5–15.5)
WBC: 13.7 10*3/uL — AB (ref 4.0–10.5)

## 2016-09-30 LAB — GLUCOSE, CAPILLARY
GLUCOSE-CAPILLARY: 282 mg/dL — AB (ref 65–99)
GLUCOSE-CAPILLARY: 115 mg/dL — AB (ref 65–99)
Glucose-Capillary: 233 mg/dL — ABNORMAL HIGH (ref 65–99)
Glucose-Capillary: 154 mg/dL — ABNORMAL HIGH (ref 65–99)
Glucose-Capillary: 121 mg/dL — ABNORMAL HIGH (ref 65–99)

## 2016-09-30 MED ORDER — METOCLOPRAMIDE HCL 5 MG/ML IJ SOLN: 5.0000 mg | Freq: Three times a day (TID) | INTRAMUSCULAR | Status: DC | PRN

## 2016-09-30 MED ORDER — HYDROMORPHONE HCL 1 MG/ML IJ SOLN
1.0000 mg | INTRAMUSCULAR | Status: DC | PRN
  Administered 2016-10-02 – 2016-10-03 (×2): 1 mg via INTRAVENOUS
  Filled 2016-09-30 (×5): qty 1

## 2016-09-30 MED ORDER — INSULIN ASPART PROT & ASPART (70-30 MIX) 100 UNIT/ML ~~LOC~~ SUSP
20.0000 [IU] | Freq: Two times a day (BID) | SUBCUTANEOUS | Status: DC
  Administered 2016-09-30 – 2016-10-02 (×4): 20 [IU] via SUBCUTANEOUS
  Filled 2016-09-30: qty 10

## 2016-09-30 MED ORDER — LIVING WELL WITH DIABETES BOOK - IN SPANISH
Freq: Once | Status: AC
  Administered 2016-09-30: 15:00:00
  Filled 2016-09-30: qty 1

## 2016-09-30 MED ORDER — METOCLOPRAMIDE HCL 5 MG PO TABS: 5.0000 mg | ORAL_TABLET | Freq: Three times a day (TID) | ORAL | Status: DC | PRN

## 2016-09-30 NOTE — Progress Notes (Signed)
[facility]                                                                              Patient Demographics  Willie Jordan, is a 51 y.o. male, DOB - 02/19/1966, JKD:[id number]  Admit date - 09/27/2016   Admitting Physician Maren Reamer, MD  Outpatient Primary MD for the patient is [facility], MD  Outpatient specialists:   LOS - 3  days    Chief Complaint  Patient presents with  . Wound Infection       Brief summary  Willie Jordan is a 51 y.o. male with medical history significant of HTN, hyperlipidemia, neuropathy, diabetes mellitus type 2 who presented to ED with left foot infected wound.Patient reported that on March 16th he came back from work and noticed the redness and swelling over the left foot. On March 19-th, he was seen at community clinic, had IM injection of ceftriaxone daily for 3 days. On March 20 patient underwent x-ray that showed no abscess, but gas accumulation in the soft tissues compatible with cellulitis. Patient was placed on oral Augmentin 875/125 mg twice a day on March 22-nd after he completed the course of IM Rocephin. The following day in the office was noted that he developed some purulent drainage from the new opening between the left great and second toe left and area was debrided. He was seen in the [facility] [place] for follow-up and sent to ED for increased swelling erythema and increased drainage from the wound    Assessment & Plan    Principal Problem: Sepsis associated with diabetic foot infection - Patient placed on IV vancomycin and cefepime, IV fluids, pain control  - Follow intraoperative cultures, blood cultures - MRI of the left foot showed large complex fluid collection in the forefoot extending into the second web space into the dorsal and plantar aspects of the foot consistent with an abscess, proximal extension to the level of the mid metatarsal shafts. Associated synovitis of the second  metatarsophalangeal joint consistent with septic joint and complex post and second right tenosynovitis. Also osteomyelitis of second proximal phalanx, early cellulitis of the second metatarsal head and neck.   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 - Continue broad spectrum antibiotics- vancomycin and cefepime -  Podiatry was consulted, patient underwent I&D of the left foot with partial second ray amputation - Continue dressing changes, per Dr Amalia Hailey, patient may require additional and more proximal surgery while inpatient  Diabetes mellitus with diabetic neuropathy, uncontrolled - Hemoglobin A1c 13.2 - Hold Glucophage, - Continue sliding scale insulin and NovoLog 70/30 while inpatient, increased to 20 units twice a day   Hypertension  - Currently stable, continue HCTZ, lisinopril  Hyperlipidemia - not treated - LDL 150, cholesterol 214, placed on Lipitor 20 mg daily  Code Status: full code  DVT Prophylaxis:  Lovenox  Family Communication: Discussed in detail with the patient, all imaging results, lab results explained to the patient   Disposition Plan:   Time Spent in minutes   25 minutes  Procedures:  PROCEDURE: Procedure(s): 1. PARTIAL 2nd RAY AMPUTATION  LEFT FOOT 2. I&D LEFT FOOT  Consultants:   Podiatry  Antimicrobials:   IV vancomycin  IV cefepime   Medications  Scheduled Meds: . atorvastatin  20 mg Oral q1800  . ceFEPime (MAXIPIME) IV  1 g Intravenous Q8H  . Chlorhexidine Gluconate Cloth  6 each Topical Q0600  . enoxaparin (LOVENOX) injection  40 mg Subcutaneous Q24H  . gabapentin  300 mg Oral QHS  . hydrochlorothiazide  25 mg Oral Q breakfast  . insulin aspart  0-9 Units Subcutaneous TID WC  . insulin aspart protamine- aspart  18 Units Subcutaneous BID WC  . lisinopril  2.5 mg Oral Daily  . living well with diabetes book- in spanish   Does not apply Once  . vancomycin  750 mg Intravenous  Q8H   Continuous Infusions: PRN Meds:.acetaminophen **OR** acetaminophen, HYDROcodone-acetaminophen, HYDROmorphone (DILAUDID) injection, metoCLOPramide **OR** metoCLOPramide (REGLAN) injection, ondansetron **OR** ondansetron (ZOFRAN) IV, oxyCODONE-acetaminophen, polyethylene glycol, traZODone   Antibiotics   Anti-infectives    Start     Dose/Rate Route Frequency Ordered Stop   09/27/16 1600  vancomycin (VANCOCIN) IVPB 750 mg/150 ml premix     750 mg 150 mL/hr over 60 Minutes Intravenous Every 8 hours 09/27/16 1556     09/27/16 1600  ceFEPIme (MAXIPIME) 1 g in dextrose 5 % 50 mL IVPB     1 g 100 mL/hr over 30 Minutes Intravenous Every 8 hours 09/27/16 1556     09/27/16 1400  metroNIDAZOLE (FLAGYL) tablet 500 mg  Status:  Discontinued     500 mg Oral Every 8 hours 09/27/16 1147 09/27/16 1550   09/27/16 1200  cefTRIAXone (ROCEPHIN) 2 g in dextrose 5 % 50 mL IVPB  Status:  Discontinued     2 g 100 mL/hr over 30 Minutes Intravenous Every 24 hours 09/27/16 1147 09/27/16 1550        Subjective:   Willie Jordan was seen and examined today. Pain in the left foot controlled, however worried about his blood sugars. Patient denies dizziness, chest pain, shortness of breath, abdominal pain, N/V/D/C, new weakness, numbess, tingling. No acute events overnight.    Objective:   Vitals:   09/30/16 0134 09/30/16 0443 09/30/16 0940 09/30/16 1059  BP: (!) 150/66 (!) 142/78 116/69 140/78  Pulse: 77 66  88  Resp: 19 18    Temp: 98 F (36.7 C) 97.9 F (36.6 C)  100.2 F (37.9 C)  TempSrc: Oral Oral    SpO2: 99% 99%  98%  Weight:      Height:        Intake/Output Summary (Last 24 hours) at 09/30/16 1236 Last data filed at 09/30/16 1100  Gross per 24 hour  Intake             1200 ml  Output             3055 ml  Net            -1855 ml     Wt Readings from Last 3 Encounters:  09/27/16 73.9 kg (163 lb)  09/26/16 72.1 kg (159 lb)  09/25/16 70.8 kg (156 lb)     Exam  General: Alert  and oriented x 3, NAD  HEENT:  Neck: Supple, no JVD, no masses  Cardiovascular: S1 S2 clear, RRR  Respiratory: Clear to auscultation bilaterally, no wheezing, rales or rhonchi  Gastrointestinal: Soft, nontender, nondistended, + bowel sounds  Ext: no cyanosis clubbing or edema  Neuro: No new deficits  Skin: Dressing intact on the left  foot  Psych: Normal affect and demeanor, alert and oriented x3    Data Reviewed:  I have personally reviewed following labs and imaging studies  Micro Results Recent Results (from the past 240 hour(s))  Culture, blood (Routine x 2)     Status: None (Preliminary result)   Collection Time: 09/27/16 11:38 AM  Result Value Ref Range Status   Specimen Description BLOOD RIGHT ANTECUBITAL  Final   Special Requests BOTTLES DRAWN AEROBIC AND ANAEROBIC 5CC  Final   Culture NO GROWTH 2 DAYS  Final   Report Status PENDING  Incomplete  Wound or Superficial Culture     Status: None   Collection Time: 09/27/16 11:43 AM  Result Value Ref Range Status   Specimen Description WOUND  Final   Special Requests LEFT FOOT  Final   Gram Stain   Final    ABUNDANT WBC PRESENT, PREDOMINANTLY PMN FEW GRAM POSITIVE COCCI IN CLUSTERS FEW GRAM POSITIVE COCCI IN PAIRS    Culture NORMAL SKIN FLORA  Final   Report Status 09/29/2016 FINAL  Final  Culture, blood (Routine x 2)     Status: None (Preliminary result)   Collection Time: 09/27/16 11:44 AM  Result Value Ref Range Status   Specimen Description BLOOD RIGHT HAND  Final   Special Requests BOTTLES DRAWN AEROBIC AND ANAEROBIC 5CC  Final   Culture NO GROWTH 2 DAYS  Final   Report Status PENDING  Incomplete  Surgical pcr screen     Status: None   Collection Time: 09/29/16  1:04 AM  Result Value Ref Range Status   MRSA, PCR NEGATIVE NEGATIVE Final   Staphylococcus aureus NEGATIVE NEGATIVE Final    Comment:        The Xpert SA Assay (FDA approved for NASAL specimens in patients over 64 years of age), is one  component of a comprehensive surveillance program.  Test performance has been validated by [facility] for patients greater than or equal to 45 year old. It is not intended to diagnose infection nor to guide or monitor treatment.   Aerobic/Anaerobic Culture (surgical/deep wound)     Status: None (Preliminary result)   Collection Time: 09/29/16  6:55 PM  Result Value Ref Range Status   Specimen Description ABSCESS  Final   Special Requests LEFT FOOT  Final   Gram Stain   Final    FEW WBC PRESENT, PREDOMINANTLY PMN FEW GRAM POSITIVE COCCI IN CLUSTERS    Culture PENDING  Incomplete   Report Status PENDING  Incomplete    Radiology Reports Mr Foot Left W Wo Contrast  Addendum Date: 09/28/2016   ADDENDUM REPORT: 09/28/2016 09:50 ADDENDUM: These results were called by telephone at the time of interpretation on 09/28/2016 at 9:49 am to Dr. York Grice , who verbally acknowledged these results. Timely orthopedic consultation recommended. Electronically Signed   By: Richardean Sale M.D.   On: 09/28/2016 09:50   Result Date: 09/28/2016 CLINICAL DATA:  Diabetic with nonhealing wound dorsally between the first and second toes. History of neuropathy. Sepsis. EXAM: MRI OF THE LEFT FOREFOOT WITHOUT AND WITH CONTRAST TECHNIQUE: Multiplanar, multisequence MR imaging of the left forefoot was performed both before and after administration of intravenous contrast. CONTRAST:  48mL MULTIHANCE GADOBENATE DIMEGLUMINE 529 MG/ML IV SOLN COMPARISON:  Radiographs 09/27/2016. FINDINGS: Bones/Joint/Cartilage There is a large ill-defined, complex fluid collection within the first web space. This collection is best defined on the post-contrast images. There is relatively limited dorsal extension along the distal half of the second  metatarsal shaft and second proximal phalanx. There is more extensive plantar extension along the first and second metatarsals and proximal phalanges. There are multiple foci of low signal  within these fluid collections, corresponding with soft tissue emphysema on the patient's radiographs. There is irregular enhancement of these collections following contrast. Approximate overall dimensions are 6.2 x 4.2 x 4.3 cm. There is probable extension to the skin dorsally at the first web space. The second proximal phalanx demonstrates abnormally decreased T1 and increased T2 signal with cortical irregularity highly suspicious for osteomyelitis. There is diffuse marrow enhancement following contrast. There is also T2 hyperintensity and enhancement within the second metatarsal head and neck which could reflect osteomyelitis. There is synovial enhancement of the second metatarsal phalangeal joint. There is some low-level T2 hyperintensity and enhancement within the first toe phalanges and the sesamoids of the first metatarsal. There is no associated abnormal T1 signal or cortical destruction. There is no suspicious signal in the first metatarsal or the third through fifth rays. Ligaments The Lisfranc ligament is intact. Muscles and Tendons The complex fluid collections described above partially envelope the flexor tendons of the first and second toes and the extensor tendon of the second toe at the level of the metatarsals. This is worrisome for infectious tenosynovitis. There is extensive T2 hyperintensity and low level enhancement throughout the forefoot musculature. Soft tissues As above, there is a complex fluid collection extending both dorsal and plantar from the first web space consistent with an abscess. In addition, there is generalized subcutaneous edema throughout the forefoot consistent with cellulitis. IMPRESSION: 1. Large complex fluid collection medially in the forefoot, extending from the second web space into the dorsal and plantar aspects of the foot, consistent with an abscess. There is proximal extension to the level of the mid metatarsal shafts. 2. Associated synovitis of the second  metatarsal phalangeal joint consistent with septic joint, and complex first and second ray tenosynovitis worrisome for infectious tenosynovitis. 3. Findings are highly worrisome for osteomyelitis of the second proximal phalanx. There is also probable early osteomyelitis of the second metatarsal head and neck. Less specific marrow changes within the phalanges of the great toe may be reactive, although early osteomyelitis difficult to exclude. 4. Generalized forefoot cellulitis. Electronically Signed: By: Richardean Sale M.D. On: 09/28/2016 09:31   Dg Chest Port 1 View  Result Date: 09/27/2016 CLINICAL DATA:  Osteomyelitis EXAM: PORTABLE CHEST 1 VIEW COMPARISON:  05/29/2007 FINDINGS: Cardiomediastinal silhouette is unremarkable. No infiltrate or pleural effusion. No pulmonary edema. Mild mid thoracic dextroscoliosis. IMPRESSION: No active disease. Electronically Signed   By: Lahoma Crocker M.D.   On: 09/27/2016 16:26   Dg Foot Complete Left  Result Date: 09/29/2016 CLINICAL DATA:  Postop from second toe amputation for osteomyelitis. EXAM: LEFT FOOT - COMPLETE 3+ VIEW COMPARISON:  09/27/2016 FINDINGS: There has been interval resection of the second toe at the level of the mid second metatarsal. Postoperative soft tissue swelling gas seen in this region, as well as skin staples in the mid and hindfoot. No other acute bone abnormality identified. Prominent plantar and dorsal calcaneal spurs again noted. IMPRESSION: Expected postoperative appearance status post left second toe amputation. No unexpected findings. Electronically Signed   By: Earle Gell M.D.   On: 09/29/2016 20:05   Dg Foot Complete Left  Result Date: 09/27/2016 CLINICAL DATA:  51 year old male with a history of foot infection EXAM: LEFT FOOT - COMPLETE 3+ VIEW COMPARISON:  09/23/2016 FINDINGS: Persisting subcutaneous/ myofacial gas of the forefoot, between  the first and second digits and second and third digits, as well as extension between the  first and second metatarsals on the AP view. Erosive changes at the base of the second proximal phalanx again demonstrated. Degenerative changes of the hindfoot and midfoot. IMPRESSION: Re- demonstration of subcutaneous/myofacial gas involving the left forefoot, as above, compatible with infection. Although the gas has not progressed significantly from the comparison plain film, necrotizing fasciitis cannot be excluded radiographically. Re- demonstration of potential osteomyelitis involving the base of the second proximal phalanx. Electronically Signed   By: Corrie Mckusick D.O.   On: 09/27/2016 12:28   Dg Foot Complete Left  Result Date: 09/23/2016 CLINICAL DATA:  Diabetic neuropathy, cellulitis, infection EXAM: LEFT FOOT - COMPLETE 3+ VIEW COMPARISON:  Unavailable FINDINGS: Soft tissue swelling and soft tissue air present of the left foot along the first and second toes. Soft tissue air extends along the dorsum of the foot and the plantar surface on the lateral view compatible with localized cellulitis from a gas-forming organism. No acute fracture or malalignment. No radiopaque or metallic foreign body. On the oblique view, there is minimal osseous irregularity of the second toe proximal phalanx at the second MTP joint suspicious for early osteomyelitis, best appreciated on the oblique view. IMPRESSION: Left foot soft tissue swelling and soft tissue gas along the first and second toes but extending on the dorsum of the foot and plantar surface on the lateral view. Findings compatible with localized cellulitis as detailed above. Subtle osseous irregularity of the left second toe proximal phalanx at the second MTP joint suspicious for early osteomyelitis. No radiopaque foreign body. Electronically Signed   By: Jerilynn Mages.  Shick M.D.   On: 09/23/2016 16:51    Lab Data:  CBC:  Recent Labs Lab 09/27/16 1146 09/28/16 0451 09/29/16 0547 09/30/16 0314  WBC 15.7* 10.1 9.2 13.7*  NEUTROABS 13.0* 7.5  --   --   HGB  11.4* 9.2* 9.7* 9.4*  HCT 34.1* 28.3* 29.4* 28.1*  MCV 82.2 81.8 81.7 83.1  PLT 406* 325 334 326   Basic Metabolic Panel:  Recent Labs Lab 09/27/16 1146 09/28/16 0451 09/29/16 0547 09/30/16 0314  NA 132* 138 137 134*  K 3.9 3.5 3.7 4.3  CL 93* 101 97* 96*  CO2 28 29 32 30  GLUCOSE 217* 90 174* 284*  BUN 14 8 8 8   CREATININE 0.83 0.63 0.78 0.81  CALCIUM 9.0 8.3* 8.3* 8.0*   GFR: Estimated Creatinine Clearance: 102.7 mL/min (by C-G formula based on SCr of 0.81 mg/dL). Liver Function Tests:  Recent Labs Lab 09/27/16 1146 09/28/16 0451  AST 25 16  ALT 20 16*  ALKPHOS 110 92  BILITOT 0.4 0.6  PROT 7.0 5.9*  ALBUMIN 2.2* 1.7*   No results for input(s): LIPASE, AMYLASE in the last 168 hours. No results for input(s): AMMONIA in the last 168 hours. Coagulation Profile:  Recent Labs Lab 09/27/16 1643  INR 1.05   Cardiac Enzymes: No results for input(s): CKTOTAL, CKMB, CKMBINDEX, TROPONINI in the last 168 hours. BNP (last 3 results) No results for input(s): PROBNP in the last 8760 hours. HbA1C:  Recent Labs  09/27/16 1643  HGBA1C 13.2*   CBG:  Recent Labs Lab 09/29/16 1649 09/29/16 1908 09/29/16 2153 09/30/16 0759 09/30/16 1213  GLUCAP 129* 86 121* 282* 233*   Lipid Profile:  Recent Labs  09/27/16 1643  CHOL 214*  HDL 28*  LDLCALC 150*  TRIG 181*  CHOLHDL 7.6   Thyroid Function Tests: No  results for input(s): TSH, T4TOTAL, FREET4, T3FREE, THYROIDAB in the last 72 hours. Anemia Panel: No results for input(s): VITAMINB12, FOLATE, FERRITIN, TIBC, IRON, RETICCTPCT in the last 72 hours. Urine analysis:    Component Value Date/Time   COLORURINE AMBER (A) 09/27/2016 1143   APPEARANCEUR HAZY (A) 09/27/2016 1143   LABSPEC 1.016 09/27/2016 1143   PHURINE 5.0 09/27/2016 1143   GLUCOSEU 50 (A) 09/27/2016 1143   HGBUR NEGATIVE 09/27/2016 1143   BILIRUBINUR NEGATIVE 09/27/2016 1143   BILIRUBINUR Negative 12/31/2015 1310   KETONESUR NEGATIVE  09/27/2016 1143   PROTEINUR 100 (A) 09/27/2016 1143   UROBILINOGEN 0.2 12/31/2015 1310   UROBILINOGEN 4.0 (H) 05/29/2007 1609   NITRITE NEGATIVE 09/27/2016 1143   LEUKOCYTESUR NEGATIVE 09/27/2016 1143     My Madariaga M.D. [facility] 09/30/2016, 12:36 PM  Pager: [phone] Between 7am to 7pm - call Pager - [phone]  After 7pm go to [email or web address] - password [email or web address]  Call night coverage person covering after 7pm

## 2016-09-30 NOTE — Progress Notes (Signed)
Inpatient Diabetes Program Recommendations  AACE/ADA: New Consensus Statement on Inpatient Glycemic Control (2015)  Target Ranges:  Prepandial:   less than 140 mg/dL      Peak postprandial:   less than 180 mg/dL (1-2 hours)      Critically ill patients:  140 - 180 mg/dL   Spoke with patient about diabetes and home regimen for diabetes control. Patient reports that he is followed by PCP for diabetes management. Patient reports that he had just seen his doctor a couple of weeks ago and she increased his 70/30 dose from 10 units BID to 18 units BID. He reports his glucose being in the 350's before that point. He now reports his glucose in the 180's to low 200 range. Patient reports he works in [employer]. He is compliant with medications. He follows a DM diet except for his beverage options. Patient reports diluting juice to drink. Patient report that he is out of strips for his glucose meter. Discussed A1C results (13.2% on 09/27/16). Discussed glucose and A1C goals. Discussed importance of checking CBGs and maintaining good CBG control to prevent long-term and short-term complications. Explained how hyperglycemia leads to damage within blood vessels which lead to the common complications seen with uncontrolled diabetes. Patient requesting material for a meal planning guide in Spanish. Discussed impact of nutrition, exercise, stress, sickness, and medications on diabetes control. Discussed carbohydrates, carbohydrate goals per day and meal, along with portion sizes. Patient verbalized understanding of information discussed and he states that he has no further questions at this time related to diabetes.  MD at time of d/c patient needs strips for glucose meter  Thanks,  Tama Headings RN, MSN, [facility] Inpatient Diabetes Coordinator Team Pager [phone] (8a-5p)

## 2016-09-30 NOTE — Care Management Note (Signed)
Case Management Note  Patient Details  Name: Willie Jordan MRN: [id number] Date of Birth: 03/14/1966  Subjective/Objective:   Diabetic foot infection, osteomyelitis, toe amputation, IV abx               Action/Plan: Discharge Planning: NCM spoke to pt and he currently does not have any insurance. Referral to Financial counselor. Pt will need HHRN for dressing changes and RW and 3n1 bedside commode. Explained [place] RN will be arranged with [facility] to see if he will qualify for the charity program. Will order DME and HH prior to dc. Pt scheduled for possible surgery on infected foot.   Will arrange [place].  Will arrange follow up appt at [facility] or [facility].    PCP Mack Hook MD ([facility])  Expected Discharge Date:                  Expected Discharge Plan:  [place]  In-House Referral:  Financial counselor  Discharge planning Services  CM Consult, [facility]   Post Acute Care Choice:  Home Health Choice offered to:  NA  DME Arranged:    DME Agency:     HH Arranged:    HH Agency:     Status of Service:  In process, will continue to follow  If discussed at Long Length of Stay Meetings, dates discussed:    Additional Comments:  Erenest Rasher, RN 09/30/2016, 3:23 PM

## 2016-09-30 NOTE — Evaluation (Signed)
Physical Therapy Evaluation Patient Details Name: Willie Jordan MRN: [id number] DOB: 01-28-1966 Today's Date: 09/30/2016   History of Present Illness  Willie Jordan is a 51 y.o. 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  PMH: medical history significant of HTN, hyperlipidemia, neuropathy, diabetes mellitus type 2.  Clinical Impression  Pt admitted with above diagnosis. Pt currently with functional limitations due to the deficits listed below (see PT Problem List). Pt was able to take pivotal steps to recliner but too much pain to walk. May need more therapy prior to d/c home but difficult to determine today.  Pt states he rents room from someone.  May be able to return there with equipment as below.  Will follow acutely and progress as able.   Pt will benefit from skilled PT to increase their independence and safety with mobility to allow discharge to the venue listed below.      Follow Up Recommendations Other (comment) (TBA, probably none given pt may be [place]icaid)    Equipment Recommendations  Rolling walker with 5" wheels;3in1 (PT);Wheelchair (measurements PT);Wheelchair cushion (measurements PT)    Recommendations for Other Services       Precautions / Restrictions Precautions Precautions: Fall Required Braces or Orthoses: Other Brace/Splint Other Brace/Splint: post op shoe left LE Restrictions Weight Bearing Restrictions: Yes LLE Weight Bearing: Non weight bearing      Mobility  Bed Mobility Overal bed mobility: Independent                Transfers Overall transfer level: Needs assistance Equipment used: Rolling walker (2 wheeled) Transfers: Sit to/from [facility] Sit to Stand: Min assist Stand pivot transfers: Mod assist       General transfer comment: Pt needed cues for hand placement as he suddenly pulled up on RW and moving impulsively.  cues to slow down but pt  still hurried to stand and pivot with RW to chair.  Steadying assist given. Was able to maintain NWB left LE. Refused to ambulate and only to chair as pt stated his foot hurt too bad.   Ambulation/Gait                Stairs            Wheelchair Mobility    Modified Rankin (Stroke Patients Only)       Balance Overall balance assessment: Needs assistance Sitting-balance support: No upper extremity supported;Feet supported Sitting balance-Leahy Scale: Good     Standing balance support: Bilateral upper extremity supported;During functional activity Standing balance-Leahy Scale: Poor Standing balance comment: relies on UE support for balance in standing.                              Pertinent Vitals/Pain Pain Assessment: 0-10 Pain Score: 10-Worst pain ever Pain Location: left foot Pain Descriptors / Indicators: Throbbing;Pressure;Grimacing;Guarding;Operative site guarding;Aching Pain Intervention(s): Limited activity within patient's tolerance;Monitored during session;Premedicated before session;Repositioned    Home Living Family/patient expects to be discharged to:: Private residence Living Arrangements: Alone Available Help at Discharge: Family;Available PRN/intermittently (has aunt; Pt states he does for himself) Type of Home: Other(Comment) (rents room in someone's home) Home Access: Stairs to enter Entrance Stairs-Rails: Right Entrance Stairs-Number of Steps: 3 Home Layout: One level Home Equipment: None      Prior Function Level of Independence: Independent  Hand Dominance        Extremity/Trunk Assessment   Upper Extremity Assessment Upper Extremity Assessment: Defer to OT evaluation    Lower Extremity Assessment Lower Extremity Assessment: LLE deficits/detail LLE Deficits / Details: limited movement due to pain LLE: Unable to fully assess due to pain    Cervical / Trunk Assessment Cervical / Trunk  Assessment: Normal  Communication   Communication: Prefers language other than English (spanish speaking but does speak English as well)  Cognition Arousal/Alertness: Awake/alert Behavior During Therapy: Impulsive Overall Cognitive Status: Impaired/Different from baseline Area of Impairment: Safety/judgement;Problem solving;Awareness                         Safety/Judgement: Decreased awareness of safety;Decreased awareness of deficits Awareness: Intellectual Problem Solving: Decreased initiation;Difficulty sequencing;Requires verbal cues        General Comments General comments (skin integrity, edema, etc.): Post op shoe placedon by this PT.  noted some blood on dressing.     Exercises     Assessment/Plan    PT Assessment Patient needs continued PT services  PT Problem List Decreased strength;Decreased balance;Decreased mobility;Decreased activity tolerance;Decreased knowledge of use of DME;Decreased safety awareness;Decreased cognition;Pain;Decreased skin integrity       PT Treatment Interventions DME instruction;Gait training;Stair training;Functional mobility training;Therapeutic activities;Therapeutic exercise;Balance training;Patient/family education    PT Goals (Current goals can be found in the Care Plan section)  Acute Rehab PT Goals Patient Stated Goal: to go home PT Goal Formulation: With patient Time For Goal Achievement: 10/14/16 Potential to Achieve Goals: Good    Frequency Min 3X/week   Barriers to discharge Decreased caregiver support      Co-evaluation               End of Session Equipment Utilized During Treatment: Gait belt Activity Tolerance: Patient limited by fatigue;Patient limited by pain Patient left: in chair;with call bell/phone within reach;with family/visitor present Nurse Communication: Mobility status PT Visit Diagnosis: Unsteadiness on feet (R26.81);Muscle weakness (generalized) (M62.81);Pain Pain - Right/Left:  Left Pain - part of body: Ankle and joints of foot    Time: 6153-7943 PT Time Calculation (min) (ACUTE ONLY): 10 min   Charges:   PT Evaluation $PT Eval Moderate Complexity: 1 Procedure     PT G Codes:        Sheritta Deeg,PT Acute Rehabilitation [phone] (pager)   Denice Paradise 09/30/2016, 12:00 PM

## 2016-09-30 NOTE — Progress Notes (Addendum)
Pt refused but agree to ambulate 4/28 after breakfast, after the third [place] [address]

## 2016-09-30 NOTE — Progress Notes (Signed)
Pharmacy Antibiotic Note  Willie Jordan is a 51 y.o. 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  Pharmacy has been consulted for vancomycin and cefepime dosing.  Patient's renal function has been stable.  Tmax 100.1 and WBC increase post surgery.   Plan: - Continue vanc 750mg  IV Q8H - Continue cefepime 1g IV Q8H, consider narrowing to Ancef - Monitor renal fxn, clinical progress, vanc trough tomorrow    Height: 5\' 4"  (162.6 cm) Weight: 163 lb (73.9 kg) IBW/kg (Calculated) : 59.2  Temp (24hrs), Avg:98.4 F (36.9 C), Min:97.2 F (36.2 C), Max:100.1 F (37.8 C)   Recent Labs Lab 09/27/16 1146 09/27/16 1158 09/27/16 1442 09/27/16 1643 09/27/16 1947 09/28/16 0451 09/29/16 0547 09/30/16 0314  WBC 15.7*  --   --   --   --  10.1 9.2 13.7*  CREATININE 0.83  --   --   --   --  0.63 0.78 0.81  LATICACIDVEN  --  2.13* 2.77* 1.6 1.5  --   --   --     Estimated Creatinine Clearance: 102.7 mL/min (by C-G formula based on SCr of 0.81 mg/dL).    Allergies  Allergen Reactions  . Aspirin Rash    Vanc 3/24 >> Cefepime 3/24 >>  3/24 HIV - negative 3/24 BCx x2 - ngtd 3/24 left foot wound - GPC on Gram stain   [name] D. Mina Marble, PharmD, BCPS Pager:  [phone] 09/30/2016, 9:11 AM

## 2016-10-01 DIAGNOSIS — D729 Disorder of white blood cells, unspecified: Secondary | ICD-10-CM

## 2016-10-01 DIAGNOSIS — E0801 Diabetes mellitus due to underlying condition with hyperosmolarity with coma: Secondary | ICD-10-CM

## 2016-10-01 LAB — VANCOMYCIN, TROUGH
Vancomycin Tr: 18 ug/mL (ref 15–20)
Vancomycin Tr: 14 ug/mL — ABNORMAL LOW (ref 15–20)

## 2016-10-01 LAB — BASIC METABOLIC PANEL
Anion gap: 8 (ref 5–15)
BUN: 11 mg/dL (ref 6–20)
CHLORIDE: 96 mmol/L — AB (ref 101–111)
CO2: 29 mmol/L (ref 22–32)
CREATININE: 0.88 mg/dL (ref 0.61–1.24)
Calcium: 7.8 mg/dL — ABNORMAL LOW (ref 8.9–10.3)
GFR calc Af Amer: >60 mL/min (ref >60)
GFR calc non Af Amer: >60 mL/min (ref >60)
GLUCOSE: 194 mg/dL — AB (ref 65–99)
Potassium: 3.7 mmol/L (ref 3.5–5.1)
Sodium: 133 mmol/L — ABNORMAL LOW (ref 135–145)

## 2016-10-01 LAB — CBC
HCT: 28.1 % — ABNORMAL LOW (ref 39.0–52.0)
Hemoglobin: 9.4 g/dL — ABNORMAL LOW (ref 13.0–17.0)
MCH: 27.5 pg (ref 26.0–34.0)
MCHC: 33.5 g/dL (ref 30.0–36.0)
MCV: 82.2 fL (ref 78.0–100.0)
PLATELETS: 288 10*3/uL (ref 150–400)
RBC: 3.42 MIL/uL — ABNORMAL LOW (ref 4.22–5.81)
RDW: 11.9 % (ref 11.5–15.5)
WBC: 15.7 10*3/uL — ABNORMAL HIGH (ref 4.0–10.5)

## 2016-10-01 LAB — GLUCOSE, CAPILLARY
GLUCOSE-CAPILLARY: 223 mg/dL — AB (ref 65–99)
Glucose-Capillary: 170 mg/dL — ABNORMAL HIGH (ref 65–99)
Glucose-Capillary: 288 mg/dL — ABNORMAL HIGH (ref 65–99)

## 2016-10-01 MED ORDER — VANCOMYCIN HCL IN DEXTROSE 1-5 GM/200ML-% IV SOLN
1000.0000 mg | Freq: Three times a day (TID) | INTRAVENOUS | Status: DC
  Administered 2016-10-01 – 2016-10-02 (×2): 1000 mg via INTRAVENOUS
  Filled 2016-10-01 (×3): qty 200

## 2016-10-01 NOTE — Progress Notes (Signed)
   PODIATRY PROGRESS NOTE    Brief History: Patient presents this AM for follow-up evaluation after partial second ray amputation incision and drainage of the left foot. DOS: 09/29/2016. Patient originally presented to [facility] emergency department for evaluation of cellulitis to the left foot. Patient was immediately admitted, placed on IV antibiotics, and podiatry consult for possible surgical intervention. MRI findings consistent with osteomyelitis to the second digit left foot as well as multiple abscess collections to the left foot. Partial second ray amputation with incision and drainage was performed in a salvage attempt to prevent further proximal amputation. Patient states that he feels well this morning and denies any nausea, vomiting, fever or chills.  Patient Active Problem List   Diagnosis Date Noted  . Diabetic foot infection ([place]) 09/27/2016  . Sepsis ([place]) 09/27/2016  . Diabetes mellitus ([place])   . 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 Cellulitis of left foot 09/25/2016  . Noncompliance 09/24/2016  . HLD (hyperlipidemia) 09/30/2014  . Essential hypertension 09/30/2014  . Type 2 diabetes mellitus with hyperglycemia (HCC) 09/30/2014     Objective/Physical Exam  CBC Latest Ref Rng & Units 10/01/2016 09/30/2016 09/29/2016  WBC 4.0 - 10.5 K/uL 15.7(H) 13.7(H) 9.2  Hemoglobin 13.0 - 17.0 g/dL 9.4(L) 9.4(L) 9.7(L)  Hematocrit 39.0 - 52.0 % 28.1(L) 28.1(L) 29.4(L)  Platelets 150 - 400 K/uL 288 311 334   General: The patient is alert and oriented x3 in no acute distress. Musculoskeletal Exam: Left foot evaluated today appears to be stable. Edema and cellulitis appears to be improved as well. Incision site to the second toe amputation site remains open for abscess drainage, gauze packing, intended for delayed primary closure. There is no malodor noted  Assessment: 1.  diabetes mellitus w/ polyneuropathy 2. Status post second ray amputation with incision and drainage left  foot. DOS: 09/29/2016 3. Cellulitis left foot  Cellulitis of left foot  Diabetic foot infection (HCC)  Chronic anemia  Type 2 diabetes mellitus with other skin complication, with long-term current use of insulin (HCC)  Neutrophilic leukocytosis  Reactive thrombocytosis  Osteomyelitis (HCC)  Post-operative state - Plan: DG Foot Complete Left, DG Foot Complete Left    Plan of Care:  Patient was evaluated this AM. Gauze packing and dressings were changed. Cultures and sensitivities are still pending. Recommend continuing current antibiotic therapy. Orders for daily dressing changes placed.   From a surgical standpoint, the patient's prognosis for limb salvage is fair. Recommend waiting for final culture and sensitivity results prior to discharge, and until foot appears stable with significant improvement.   Please call with any questions or concerns : [phone].   Edrick Kins, DPM [facility]  Dr. Edrick Kins, [address]                                        [place], [place] [address]                Office [phone]  Fax [phone]

## 2016-10-01 NOTE — Progress Notes (Signed)
Pharmacy Antibiotic Note  Willie Jordan is a 51 y.o. 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  Pharmacy has been consulted for vancomycin and cefepime dosing.    Patient's renal function has been stable.  Tmax 100.1 and WBC increase post surgery.   Plan: - Continue vanc 750mg  IV Q8H - Continue cefepime 1g IV Q8H, consider narrowing to Ancef - Monitor renal fxn, clinical progress, repeat vanc trough today    Height: 5\' 4"  (162.6 cm) Weight: 163 lb (73.9 kg) IBW/kg (Calculated) : 59.2  Temp (24hrs), Avg:100.3 F (37.9 C), Min:98.9 F (37.2 C), Max:102 F (38.9 C)   Recent Labs Lab 09/27/16 1146 09/27/16 1158 09/27/16 1442 09/27/16 1643 09/27/16 1947 09/28/16 0451 09/29/16 0547 09/30/16 0314 10/01/16 0822  WBC 15.7*  --   --   --   --  10.1 9.2 13.7* 15.7*  CREATININE 0.83  --   --   --   --  0.63 0.78 0.81 0.88  LATICACIDVEN  --  2.13* 2.77* 1.6 1.5  --   --   --   --   VANCOTROUGH  --   --   --   --   --   --   --   --  18    Estimated Creatinine Clearance: 94.5 mL/min (by C-G formula based on SCr of 0.88 mg/dL).    Allergies  Allergen Reactions  . Aspirin Rash    Vanc 3/24 >> Cefepime 3/24 >>  3/28 VT = 18 mcg/mL on 750mg  q8 (lab drawn AFTER dose hung)  3/24 HIV - negative 3/24 BCx x2 - NGTD 3/24 left foot wound - GPC on Gram stain   Nasim Habeeb D. Mina Marble, PharmD, BCPS Pager:  [phone] 10/01/2016, 9:57 AM

## 2016-10-01 NOTE — Progress Notes (Signed)
PROGRESS NOTE    Willie Jordan  SHF:[id number] DOB: 1965-11-03 DOA: 09/27/2016 PCP: Mack Hook, MD    Brief Narrative:  51 y.o.malewith medical history significant of HTN, hyperlipidemia, neuropathy, diabetes mellitus type 2 who presented to ED with left foot infected wound.Patient reported that on March 16th he came back from work and noticed the redness and swelling over the left foot. On March 19-th, he was seen at community clinic, had IM injection of ceftriaxone daily for 3 days. On March 20 patient underwent x-ray that showed no abscess, but gas accumulation in the soft tissues compatible with cellulitis. Patient was placed on oral Augmentin 875/125 mg twice a day on March 22-ndafter he completed the course of IM Rocephin. The following day in the office was noted that he developed some purulent drainage from the new opening between the left great and second toe left and area was debrided. He was seen in the [facility] for follow-up and sent to ED for increased swelling erythema and increased drainage from the wound  Assessment & Plan:   Principal Problem:   Diabetic foot infection ([place]) Active Problems:   HLD (hyperlipidemia)   Essential hypertension   Type 2 diabetes mellitus with hyperglycemia ([place])   Sepsis ([place])   Diabetes mellitus ([place])   Pyogenic inflammation of bone ([place])  Principal Problem: Sepsis associated with diabetic foot infection - Patient is continued on IV vancomycin and cefepime, IV fluids, pain control  - Few staph species noted on cultures - MRI of the left foot showed large complex fluid collection in the forefoot extending into the second web space into the dorsal and plantar aspects of the foot consistent with an abscess, proximal extension to the level of the mid metatarsal shafts. Associated synovitis of the second metatarsophalangeal joint consistent with septic joint and complex post and second right tenosynovitis. Also  osteomyelitis of second proximal phalanx, early cellulitis of the second metatarsal head and neck.   - Patient remains febrile overnight and this afternoon, Tmax of 101.17F. WBC has risen to 15.7k   Active problems Diabetic foot infection with abscess and osteomyelitis - For now, will continue broad spectrum antibiotics- vancomycin and cefepime -  Podiatry is following, patient is s/p I&D of the left foot with partial second ray amputation - Continue dressing changes, per Dr Amalia Hailey, patient may require additional and more proximal surgery while inpatient - Podiatry to follow to determine patient's progression over the next few days  Diabetes mellitus with diabetic neuropathy, uncontrolled - Hemoglobin A1c 13.2 - Glucophage on hold - Continue sliding scale insulin and NovoLog 70/30 while inpatient, increased to 20 units twice a day  - Stable thus far  Hypertension  - Currently stable, continue HCTZ, lisinopril - BP stable at this time  Hyperlipidemia - not treated - LDL 150, cholesterol 214, placed on Lipitor 20 mg daily - Stable  DVT prophylaxis: Lovenox subQ Code Status: Full Family Communication: Pt in room, family not at bedside Disposition Plan: Uncertain at this time  Consultants:   Podiatry  Procedures:  1. 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 I&D LEFT FOOT  Antimicrobials: Anti-infectives    Start     Dose/Rate Route Frequency Ordered Stop   09/27/16 1600  vancomycin (VANCOCIN) IVPB 750 mg/150 ml premix     750 mg 150 mL/hr over 60 Minutes Intravenous Every 8 hours 09/27/16 1556     09/27/16 1600  ceFEPIme (MAXIPIME) 1 g in dextrose 5 % 50 mL IVPB  1 g 100 mL/hr over 30 Minutes Intravenous Every 8 hours 09/27/16 1556     09/27/16 1400  metroNIDAZOLE (FLAGYL) tablet 500 mg  Status:  Discontinued     500 mg Oral Every 8 hours 09/27/16 1147 09/27/16 1550   09/27/16 1200  cefTRIAXone (ROCEPHIN) 2 g in dextrose 5 % 50 mL IVPB  Status:  Discontinued     2  g 100 mL/hr over 30 Minutes Intravenous Every 24 hours 09/27/16 1147 09/27/16 1550       Subjective: No complaints at this time  Objective: Vitals:   09/30/16 2130 10/01/16 0522 10/01/16 0630 10/01/16 1501  BP: 136/68 114/65  128/67  Pulse: 89 88  86  Resp: 18 19  18   Temp: 98.9 F (37.2 C) (!) 101 F (38.3 C) 99.8 F (37.7 C) (!) 101.8 F (38.8 C)  TempSrc: Oral Oral  Oral  SpO2: 95% 92%  95%  Weight:      Height:        Intake/Output Summary (Last 24 hours) at 10/01/16 1549 Last data filed at 10/01/16 1100  Gross per 24 hour  Intake             1170 ml  Output             2476 ml  Net            -1306 ml   Filed Weights   09/27/16 1110 09/27/16 1603  Weight: 73.9 kg (163 lb) 73.9 kg (163 lb)    Examination:  General exam: Appears calm and comfortable  Respiratory system: Clear to auscultation. Respiratory effort normal. Cardiovascular system: S1 & S2 heard, RRR. Gastrointestinal system: Abdomen is nondistended, soft and nontender. No organomegaly or masses felt. Normal bowel sounds heard. Central nervous system: Alert and oriented. No focal neurological deficits. Extremities: Symmetric 5 x 5 power. Skin: No rashes, lesions Psychiatry: Judgement and insight appear normal. Mood & affect appropriate.   Data Reviewed: I have personally reviewed following labs and imaging studies  CBC:  Recent Labs Lab 09/27/16 1146 09/28/16 0451 09/29/16 0547 09/30/16 0314 10/01/16 0822  WBC 15.7* 10.1 9.2 13.7* 15.7*  NEUTROABS 13.0* 7.5  --   --   --   HGB 11.4* 9.2* 9.7* 9.4* 9.4*  HCT 34.1* 28.3* 29.4* 28.1* 28.1*  MCV 82.2 81.8 81.7 83.1 82.2  PLT 406* 325 334 311 998   Basic Metabolic Panel:  Recent Labs Lab 09/27/16 1146 09/28/16 0451 09/29/16 0547 09/30/16 0314 10/01/16 0822  NA 132* 138 137 134* 133*  K 3.9 3.5 3.7 4.3 3.7  CL 93* 101 97* 96* 96*  CO2 28 29 32 30 29  GLUCOSE 217* 90 174* 284* 194*  BUN 14 8 8 8 11   CREATININE 0.83 0.63 0.78 0.81  0.88  CALCIUM 9.0 8.3* 8.3* 8.0* 7.8*   GFR: Estimated Creatinine Clearance: 94.5 mL/min (by C-G formula based on SCr of 0.88 mg/dL). Liver Function Tests:  Recent Labs Lab 09/27/16 1146 09/28/16 0451  AST 25 16  ALT 20 16*  ALKPHOS 110 92  BILITOT 0.4 0.6  PROT 7.0 5.9*  ALBUMIN 2.2* 1.7*   No results for input(s): LIPASE, AMYLASE in the last 168 hours. No results for input(s): AMMONIA in the last 168 hours. Coagulation Profile:  Recent Labs Lab 09/27/16 1643  INR 1.05   Cardiac Enzymes: No results for input(s): CKTOTAL, CKMB, CKMBINDEX, TROPONINI in the last 168 hours. BNP (last 3 results) No results for input(s): PROBNP in the  last 8760 hours. HbA1C: No results for input(s): HGBA1C in the last 72 hours. CBG:  Recent Labs Lab 09/30/16 1615 09/30/16 2127 09/30/16 2147 10/01/16 0718 10/01/16 1224  GLUCAP 154* 115* 121* 170* 223*   Lipid Profile: No results for input(s): CHOL, HDL, LDLCALC, TRIG, CHOLHDL, LDLDIRECT in the last 72 hours. Thyroid Function Tests: No results for input(s): TSH, T4TOTAL, FREET4, T3FREE, THYROIDAB in the last 72 hours. Anemia Panel: No results for input(s): VITAMINB12, FOLATE, FERRITIN, TIBC, IRON, RETICCTPCT in the last 72 hours. Sepsis Labs:  Recent Labs Lab 09/27/16 1158 09/27/16 1442 09/27/16 1643 09/27/16 1947  PROCALCITON  --   --  <0.10  --   LATICACIDVEN 2.13* 2.77* 1.6 1.5    Recent Results (from the past 240 hour(s))  Culture, blood (Routine x 2)     Status: None (Preliminary result)   Collection Time: 09/27/16 11:38 AM  Result Value Ref Range Status   Specimen Description BLOOD RIGHT ANTECUBITAL  Final   Special Requests BOTTLES DRAWN AEROBIC AND ANAEROBIC 5CC  Final   Culture NO GROWTH 4 DAYS  Final   Report Status PENDING  Incomplete  Wound or Superficial Culture     Status: None   Collection Time: 09/27/16 11:43 AM  Result Value Ref Range Status   Specimen Description WOUND  Final   Special Requests LEFT  FOOT  Final   Gram Stain   Final    ABUNDANT WBC PRESENT, PREDOMINANTLY PMN FEW GRAM POSITIVE COCCI IN CLUSTERS FEW GRAM POSITIVE COCCI IN PAIRS    Culture NORMAL SKIN FLORA  Final   Report Status 09/29/2016 FINAL  Final  Culture, blood (Routine x 2)     Status: None (Preliminary result)   Collection Time: 09/27/16 11:44 AM  Result Value Ref Range Status   Specimen Description BLOOD RIGHT HAND  Final   Special Requests BOTTLES DRAWN AEROBIC AND ANAEROBIC 5CC  Final   Culture NO GROWTH 4 DAYS  Final   Report Status PENDING  Incomplete  Surgical pcr screen     Status: None   Collection Time: 09/29/16  1:04 AM  Result Value Ref Range Status   MRSA, PCR NEGATIVE NEGATIVE Final   Staphylococcus aureus NEGATIVE NEGATIVE Final    Comment:        The Xpert SA Assay (FDA approved for NASAL specimens in patients over 34 years of age), is one component of a comprehensive surveillance program.  Test performance has been validated by [facility] for patients greater than or equal to 32 year old. It is not intended to diagnose infection nor to guide or monitor treatment.   Aerobic/Anaerobic Culture (surgical/deep wound)     Status: None (Preliminary result)   Collection Time: 09/29/16  6:55 PM  Result Value Ref Range Status   Specimen Description ABSCESS  Final   Special Requests LEFT FOOT  Final   Gram Stain   Final    FEW WBC PRESENT, PREDOMINANTLY PMN FEW GRAM POSITIVE COCCI IN CLUSTERS    Culture   Final    FEW STAPHYLOCOCCUS LUGDUNENSIS SUSCEPTIBILITIES TO FOLLOW    Report Status PENDING  Incomplete     Radiology Studies: Dg Foot Complete Left  Result Date: 09/29/2016 CLINICAL DATA:  Postop from second toe amputation for osteomyelitis. EXAM: LEFT FOOT - COMPLETE 3+ VIEW COMPARISON:  09/27/2016 FINDINGS: There has been interval resection of the second toe at the level of the mid second metatarsal. Postoperative soft tissue swelling gas seen in this region, as well as  skin  staples in the mid and hindfoot. No other acute bone abnormality identified. Prominent plantar and dorsal calcaneal spurs again noted. IMPRESSION: Expected postoperative appearance status post left second toe amputation. No unexpected findings. Electronically Signed   By: Earle Gell M.D.   On: 09/29/2016 20:05    Scheduled Meds: . atorvastatin  20 mg Oral q1800  . ceFEPime (MAXIPIME) IV  1 g Intravenous Q8H  . Chlorhexidine Gluconate Cloth  6 each Topical Q0600  . enoxaparin (LOVENOX) injection  40 mg Subcutaneous Q24H  . gabapentin  300 mg Oral QHS  . hydrochlorothiazide  25 mg Oral Q breakfast  . insulin aspart  0-9 Units Subcutaneous TID WC  . insulin aspart protamine- aspart  20 Units Subcutaneous BID WC  . lisinopril  2.5 mg Oral Daily  . vancomycin  750 mg Intravenous Q8H   Continuous Infusions:   LOS: 4 days   Keasha Malkiewicz, Orpah Melter, MD [facility] Pager [phone]  If 7PM-7AM, please contact night-coverage [email or web address] Password [facility] 10/01/2016, 3:49 PM

## 2016-10-01 NOTE — Progress Notes (Signed)
qPhysical Therapy Treatment Patient Details Name: Willie Jordan MRN: [id number] DOB: [birth year] Today's Date: 10/01/2016    History of Present Illness Willie Jordan is a 51 y.o. 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  PMH: medical history significant of HTN, hyperlipidemia, neuropathy, diabetes mellitus type 2.    PT Comments    Pt admitted with above diagnosis. Pt currently with functional limitations due to balance and endurance deficits.Pt was able to ambulate with RW with min guard to min assist.  Impulsivity less today. Will continue to progress pt.  Needs max encouragement to get up today.  Pt will benefit from skilled PT to increase their independence and safety with mobility to allow discharge to the venue listed below.     Follow Up Recommendations  Other (comment) (TBA, probably none given pt may be [place]Edicaid)     Equipment Recommendations  Rolling walker with 5" wheels;3in1 (PT);Wheelchair (measurements PT);Wheelchair cushion (measurements PT)    Recommendations for Other Services       Precautions / Restrictions Precautions Precautions: Fall Required Braces or Orthoses: Other Brace/Splint Other Brace/Splint: post op shoe left LE Restrictions Weight Bearing Restrictions: Yes LLE Weight Bearing: Non weight bearing    Mobility  Bed Mobility Overal bed mobility: Independent                Transfers Overall transfer level: Needs assistance Equipment used: Rolling walker (2 wheeled) Transfers: Sit to/from [facility] Sit to Stand: Min assist         General transfer comment: Pt needed cues for hand placement Not moving as impulsively as yesterday but cued to move slowly.  Steadying assist given. Was able to maintain NWB left LE.  Ambulation/Gait Ambulation/Gait assistance: Min assist;+2 safety/equipment Ambulation Distance (Feet): 60 Feet (20 feet then 40  feet) Assistive device: Rolling walker (2 wheeled) Gait Pattern/deviations: Step-to pattern;Decreased stride length   Gait velocity interpretation: Below normal speed for age/gender General Gait Details: Pt maintains weight bearing without cues.  Pt needs cues to not step too close to front of RW.  Pt progressing.  Needed one sitting rest break.  Pt becomes more impulsive as he fatigues.     Stairs            Wheelchair Mobility    Modified Rankin (Stroke Patients Only)       Balance Overall balance assessment: Needs assistance Sitting-balance support: No upper extremity supported;Feet supported Sitting balance-Leahy Scale: Good     Standing balance support: Bilateral upper extremity supported;During functional activity Standing balance-Leahy Scale: Poor Standing balance comment: relies on UE support for balance in standing.                             Cognition Arousal/Alertness: Awake/alert Behavior During Therapy: Impulsive Overall Cognitive Status: Impaired/Different from baseline Area of Impairment: Safety/judgement;Problem solving;Awareness                         Safety/Judgement: Decreased awareness of safety;Decreased awareness of deficits Awareness: Intellectual Problem Solving: Decreased initiation;Difficulty sequencing;Requires verbal cues        Exercises      General Comments General comments (skin integrity, edema, etc.): Post op shoe in place.       Pertinent Vitals/Pain Pain Assessment: 0-10 Pain Score: 6  Pain Location: left foot Pain Descriptors / Indicators: Throbbing;Pressure;Grimacing;Guarding;Operative site guarding;Aching Pain Intervention(s): Limited  activity within patient's tolerance;Monitored during session;Repositioned;Patient requesting pain meds-RN notified   VSS Home Living                      Prior Function            PT Goals (current goals can now be found in the care plan section)  Progress towards PT goals: Progressing toward goals    Frequency    Min 3X/week      PT Plan Current plan remains appropriate    Co-evaluation             End of Session Equipment Utilized During Treatment: Gait belt Activity Tolerance: Patient limited by fatigue;Patient limited by pain Patient left: in chair;with call bell/phone within reach Nurse Communication: Mobility status;Patient requests pain meds PT Visit Diagnosis: Unsteadiness on feet (R26.81);Muscle weakness (generalized) (M62.81);Pain Pain - Right/Left: Left Pain - part of body: Ankle and joints of foot     Time: 1055-1109 PT Time Calculation (min) (ACUTE ONLY): 14 min  Charges:  $Gait Training: 8-22 mins                    G Codes:       [place] [phone] (pager)    Willie Jordan 10/01/2016, 1:50 PM

## 2016-10-01 NOTE — Care Management Note (Signed)
Case Management Note  Patient Details  Name: Ramiro Pangilinan MRN: [id number] Date of Birth: 04/20/1966  Subjective/Objective:                    Action/Plan:  DME ordered. Will follow , patient may need additional surgery this admission . If home health RN needed will need order with wound instructions and face to face.  Expected Discharge Date:                  Expected Discharge Plan:  [place]  In-House Referral:  NA  Discharge planning Services  CM Consult  Post Acute Care Choice:  Home Health Choice offered to:  NA  DME Arranged:  3-N-1, Walker rolling, Wheelchair manual DME Agency:  [place]:    [place] Agency:     Status of Service:  In process, will continue to follow  If discussed at Long Length of Stay Meetings, dates discussed:    Additional Comments:  Marilu Favre, RN 10/01/2016, 11:04 AM

## 2016-10-01 NOTE — Progress Notes (Signed)
650mg  po tylenol administered for a low grade fever

## 2016-10-01 NOTE — Progress Notes (Signed)
Pharmacy Antibiotic Note  Willie Jordan is a 51 y.o. male admitted on 09/27/2016 with Osteomyelitis.  Pharmacy has been consulted for Vancomycin dosing.  Vanc trough repeated as drawn after dose hung this AM.  Trough was drawn 1hr before dose and is below goal.  Dose has not been hung yet  Plan: Give Vancomycin 750mg  IV dose now, as dose is late Change Vancomycin to 1000mg  IV q8 and start at 2300 Repeat Vanc trough at steady state Watch Cr, renal fxn  Height: 5\' 4"  (162.6 cm) Weight: 163 lb (73.9 kg) IBW/kg (Calculated) : 59.2  Temp (24hrs), Avg:100.7 F (38.2 C), Min:98.9 F (37.2 C), Max:102 F (38.9 C)   Recent Labs Lab 09/27/16 1146 09/27/16 1158 09/27/16 1442 09/27/16 1643 09/27/16 1947 09/28/16 0451 09/29/16 0547 09/30/16 0314 10/01/16 0822 10/01/16 1510  WBC 15.7*  --   --   --   --  10.1 9.2 13.7* 15.7*  --   CREATININE 0.83  --   --   --   --  0.63 0.78 0.81 0.88  --   LATICACIDVEN  --  2.13* 2.77* 1.6 1.5  --   --   --   --   --   VANCOTROUGH  --   --   --   --   --   --   --   --  18 14*    Estimated Creatinine Clearance: 94.5 mL/min (by C-G formula based on SCr of 0.88 mg/dL).    Allergies  Allergen Reactions  . Aspirin Rash   Vanc 3/24 >> Cefepime 3/24 >>  3/28 VT = 18 mcg/mL on 750mg  q8 (lab drawn AFTER dose hung)  3/24 HIV - negative 3/24 BCx x2 - NGTD 3/24 left foot wound - GPC on Gram stain  Thank you for allowing pharmacy to be a part of this patient's care.   Gracy Bruins, PharmD Clinical Pharmacist [facility]

## 2016-10-02 DIAGNOSIS — M8600 Acute hematogenous osteomyelitis, unspecified site: Secondary | ICD-10-CM

## 2016-10-02 LAB — CULTURE, BLOOD (ROUTINE X 2)
CULTURE: NO GROWTH
Culture: NO GROWTH

## 2016-10-02 LAB — CBC
HCT: 29 % — ABNORMAL LOW (ref 39.0–52.0)
HEMOGLOBIN: 9.5 g/dL — AB (ref 13.0–17.0)
MCH: 26.8 pg (ref 26.0–34.0)
MCHC: 32.8 g/dL (ref 30.0–36.0)
MCV: 81.7 fL (ref 78.0–100.0)
Platelets: 287 10*3/uL (ref 150–400)
RBC: 3.55 MIL/uL — ABNORMAL LOW (ref 4.22–5.81)
RDW: 11.5 % (ref 11.5–15.5)
WBC: 11.4 10*3/uL — ABNORMAL HIGH (ref 4.0–10.5)

## 2016-10-02 LAB — BASIC METABOLIC PANEL
Anion gap: 7 (ref 5–15)
BUN: 8 mg/dL (ref 6–20)
CHLORIDE: 94 mmol/L — AB (ref 101–111)
CO2: 31 mmol/L (ref 22–32)
Calcium: 7.6 mg/dL — ABNORMAL LOW (ref 8.9–10.3)
Creatinine, Ser: 0.75 mg/dL (ref 0.61–1.24)
GFR calc Af Amer: >60 mL/min (ref >60)
GFR calc non Af Amer: >60 mL/min (ref >60)
GLUCOSE: 196 mg/dL — AB (ref 65–99)
POTASSIUM: 3.8 mmol/L (ref 3.5–5.1)
Sodium: 132 mmol/L — ABNORMAL LOW (ref 135–145)

## 2016-10-02 LAB — GLUCOSE, CAPILLARY
GLUCOSE-CAPILLARY: 193 mg/dL — AB (ref 65–99)
Glucose-Capillary: 240 mg/dL — ABNORMAL HIGH (ref 65–99)
Glucose-Capillary: 227 mg/dL — ABNORMAL HIGH (ref 65–99)
Glucose-Capillary: 285 mg/dL — ABNORMAL HIGH (ref 65–99)
Glucose-Capillary: 129 mg/dL — ABNORMAL HIGH (ref 65–99)

## 2016-10-02 MED ORDER — INSULIN ASPART PROT & ASPART (70-30 MIX) 100 UNIT/ML ~~LOC~~ SUSP
22.0000 [IU] | Freq: Two times a day (BID) | SUBCUTANEOUS | Status: DC
  Administered 2016-10-02 – 2016-10-05 (×6): 22 [IU] via SUBCUTANEOUS
  Filled 2016-10-02: qty 10

## 2016-10-02 MED ORDER — INSULIN ASPART 100 UNIT/ML ~~LOC~~ SOLN: 0.0000 [IU] | Freq: Every day | SUBCUTANEOUS | Status: DC

## 2016-10-02 MED ORDER — CEFAZOLIN SODIUM-DEXTROSE 2-4 GM/100ML-% IV SOLN
2.0000 g | Freq: Three times a day (TID) | INTRAVENOUS | Status: DC
  Administered 2016-10-02 – 2016-10-04 (×6): 2 g via INTRAVENOUS
  Filled 2016-10-02 (×7): qty 100

## 2016-10-02 MED ORDER — INSULIN ASPART 100 UNIT/ML ~~LOC~~ SOLN
0.0000 [IU] | Freq: Three times a day (TID) | SUBCUTANEOUS | Status: DC
  Administered 2016-10-02 – 2016-10-03 (×2): 3 [IU] via SUBCUTANEOUS
  Administered 2016-10-03: 5 [IU] via SUBCUTANEOUS
  Administered 2016-10-03 – 2016-10-04 (×3): 3 [IU] via SUBCUTANEOUS
  Administered 2016-10-04: 2 [IU] via SUBCUTANEOUS
  Administered 2016-10-05: 5 [IU] via SUBCUTANEOUS
  Administered 2016-10-05: 2 [IU] via SUBCUTANEOUS

## 2016-10-02 NOTE — Progress Notes (Signed)
Pharmacy Antibiotic Note  Willie Jordan is a 51 y.o. male admitted on 09/27/2016 with Osteomyelitis.  Pharmacy now consulted for cefazolin dosing.  Day #5 of broad spectrum abx for left diabetic foot infxn. MRI findings show osteo of the second digit of left foot. Now s/p 2nd ray amputation and I&D left foot 3/26. May require additional surgery. Cx showed pan sensitive staph lugdunensis. Tmax of 101.8 yesterday, WBC down to 11.4.  Plan: Stop vancomycin and cefepime Start cefazolin 2g IV Q8h Monitor clinical picture, renal function F/U LOT  Height: 5\' 4"  (162.6 cm) Weight: 163 lb (73.9 kg) IBW/kg (Calculated) : 59.2  Temp (24hrs), Avg:100.3 F (37.9 C), Min:99.1 F (37.3 C), Max:101.8 F (38.8 C)   Recent Labs Lab 09/27/16 1158 09/27/16 1442 09/27/16 1643 09/27/16 1947 09/28/16 0451 09/29/16 0547 09/30/16 0314 10/01/16 0822 10/01/16 1510 10/02/16 0555 10/02/16 0817  WBC  --   --   --   --  10.1 9.2 13.7* 15.7*  --   --  11.4*  CREATININE  --   --   --   --  0.63 0.78 0.81 0.88  --  0.75  --   LATICACIDVEN 2.13* 2.77* 1.6 1.5  --   --   --   --   --   --   --   VANCOTROUGH  --   --   --   --   --   --   --  18 14*  --   --     Estimated Creatinine Clearance: 104 mL/min (by C-G formula based on SCr of 0.75 mg/dL).    Allergies  Allergen Reactions  . Aspirin Rash   Vanc 3/24 >> 3/29 Cefepime 3/24 >> 3/29 Cefazolin 3/29 >>  3/28 VT = 18 mcg/mL on 750mg  q8 (lab drawn AFTER dose hung so will repeat level) 3/28 VT = 14 (Drawn 1 hr before dose due, increase to 1g Q8h)  3/24 HIV - negative 3/24 BCx x2: ngtd 3/24 left foot wound: GPC in clusters and GPC in pairs 3/26: Left foot abscess: staph lugdunensis (pan sensitive)  Elenor Quinones, PharmD, BCPS Clinical Pharmacist Pager [phone] 10/02/2016 1:26 PM

## 2016-10-02 NOTE — Progress Notes (Signed)
qPhysical Therapy Treatment Patient Details Name: Willie Jordan MRN: [id number] DOB: 05-29-1966 Today's Date: 10/02/2016    History of Present Illness Willie Jordan is a 51 y.o. 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  PMH: medical history significant of HTN, hyperlipidemia, neuropathy, diabetes mellitus type 2.    PT Comments    Pt admitted with above diagnosis. Pt currently with functional limitations due to balance and endurance deficits. Pt ambulated a longer distance and still needed one sitting rest break.  Impulsivity improving.  Pt will benefit from skilled PT to increase their independence and safety with mobility to allow discharge to the venue listed below.     Follow Up Recommendations  Other (comment) (TBA, probably none given pt may be MEdicaid)     Equipment Recommendations  Rolling walker with 5" wheels;3in1 (PT);Wheelchair (measurements PT);Wheelchair cushion (measurements PT)    Recommendations for Other Services       Precautions / Restrictions Precautions Precautions: Fall Required Braces or Orthoses: Other Brace/Splint Other Brace/Splint: post op shoe left LE Restrictions Weight Bearing Restrictions: Yes LLE Weight Bearing: Non weight bearing    Mobility  Bed Mobility Overal bed mobility: Independent                Transfers Overall transfer level: Needs assistance Equipment used: Rolling walker (2 wheeled) Transfers: Sit to/from [facility] Sit to Stand: Min assist         General transfer comment: Pt needed cues for hand placement Not moving as impulsively as yesterday but cued to move slowly.  Steadying assist given. Was able to maintain NWB left LE.  Ambulation/Gait Ambulation/Gait assistance: Min assist;+2 safety/equipment Ambulation Distance (Feet): 110 Feet (60 feet, 50 feet) Assistive device: Rolling walker (2 wheeled) Gait  Pattern/deviations: Step-to pattern;Decreased stride length   Gait velocity interpretation: Below normal speed for age/gender General Gait Details: Pt maintains weight bearing without cues.  Pt needs cues to not step too close to front of RW.  Pt progressing.  Needed one sitting rest break.  Pt becomes more impulsive as he fatigues.     Stairs            Wheelchair Mobility    Modified Rankin (Stroke Patients Only)       Balance Overall balance assessment: Needs assistance Sitting-balance support: No upper extremity supported;Feet supported Sitting balance-Leahy Scale: Good     Standing balance support: Bilateral upper extremity supported;During functional activity Standing balance-Leahy Scale: Poor Standing balance comment: relies on UE support for balance in standing.                             Cognition Arousal/Alertness: Awake/alert Behavior During Therapy: Impulsive Overall Cognitive Status: Impaired/Different from baseline Area of Impairment: Safety/judgement;Problem solving;Awareness                         Safety/Judgement: Decreased awareness of safety;Decreased awareness of deficits Awareness: Intellectual Problem Solving: Requires verbal cues        Exercises      General Comments General comments (skin integrity, edema, etc.): post op shoe in place.  Pt also put tennis shoe on other foot.       Pertinent Vitals/Pain Pain Assessment: Faces Faces Pain Scale: Hurts little more Pain Location: left foot Pain Descriptors / Indicators: Throbbing;Pressure;Grimacing;Guarding;Operative site guarding;Aching Pain Intervention(s): Limited activity within patient's tolerance;Monitored during session;Premedicated  before session;Repositioned   VSS Home Living                      Prior Function            PT Goals (current goals can now be found in the care plan section) Progress towards PT goals: Progressing toward goals     Frequency    Min 3X/week      PT Plan Current plan remains appropriate    Co-evaluation             End of Session Equipment Utilized During Treatment: Gait belt Activity Tolerance: Patient limited by fatigue;Patient limited by pain Patient left: in chair;with call bell/phone within reach Nurse Communication: Mobility status PT Visit Diagnosis: Unsteadiness on feet (R26.81);Muscle weakness (generalized) (M62.81);Pain Pain - Right/Left: Left Pain - part of body: Ankle and joints of foot     Time: 2820-6015 PT Time Calculation (min) (ACUTE ONLY): 13 min  Charges:  $Gait Training: 8-22 mins                    G Codes:       Willie Jordan,PT Acute Rehabilitation [phone] (pager)    Denice Paradise 10/02/2016, 1:02 PM

## 2016-10-02 NOTE — Progress Notes (Signed)
PROGRESS NOTE    Willie Jordan  FXT:[id number] DOB: 10/02/1965 DOA: 09/27/2016 PCP: Mack Hook, MD    Brief Narrative:  51 y.o.malewith medical history significant of HTN, hyperlipidemia, neuropathy, diabetes mellitus type 2 who presented to ED with left foot infected wound.Patient reported that on March 16th he came back from work and noticed the redness and swelling over the left foot. On March 19-th, he was seen at community [facility], had IM injection of ceftriaxone daily for 3 days. On March 20 patient underwent x-ray that showed no abscess, but gas accumulation in the soft tissues compatible with cellulitis. Patient was placed on oral Augmentin 875/125 mg twice a day on March 22-ndafter he completed the course of IM Rocephin. The following day in the office was noted that he developed some purulent drainage from the new opening between the left great and second toe left and area was debrided. He was seen in the [place] for follow-up and sent to ED for increased swelling erythema and increased drainage from the wound  Assessment & Plan:   Principal Problem:   Diabetic foot infection ([place]) Active Problems:   HLD (hyperlipidemia)   Essential hypertension   Type 2 diabetes mellitus with hyperglycemia ([place])   Sepsis ([place])   Diabetes mellitus ([place])   Pyogenic inflammation of bone ([place])  Principal Problem: Sepsis associated with diabetic foot infection - Patient is continued on IV vancomycin and cefepime, IV fluids, pain control  - Few staph species noted on cultures - MRI of the left foot showed large complex fluid collection in the forefoot extending into the second web space into the dorsal and plantar aspects of the foot consistent with an abscess, proximal extension to the level of the mid metatarsal shafts. Associated synovitis of the second metatarsophalangeal joint consistent with septic joint and complex post and second right tenosynovitis. Also  osteomyelitis of second proximal phalanx, early cellulitis of the second metatarsal head and neck.   - Tmax of 101.34F at 1501 on 3/28. WBC has improved overnight - Cultures reviewed, pos for pan-sensitive staph species. Plan to transition to ancef   Active problems Diabetic foot infection with abscess and osteomyelitis - For now, will continue on ancef per above -  Podiatry is following, patient is s/p I&D of the left foot with partial second ray amputation - Continue dressing changes, per Dr Amalia Hailey, patient may require additional and more proximal surgery while inpatient - Stable from Podiatry standpoint  Diabetes mellitus with diabetic neuropathy, uncontrolled - Hemoglobin A1c 13.2 - Glucophage remains on hold - Continue sliding scale insulin  - Glucose in the mid-200's. Will increase 70/30 dose to 22 units  Hypertension  - Currently stable, continue HCTZ, lisinopril - BP currently stable at this time  Hyperlipidemia - Had been untreated - LDL 150, cholesterol 214, placed on Lipitor 20 mg daily - Stable  DVT prophylaxis: Lovenox subQ Code Status: Full Family Communication: Pt in room, family not at bedside Disposition Plan: Uncertain at this time  Consultants:   Podiatry  Procedures:  1. 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 I&D LEFT FOOT  Antimicrobials: Anti-infectives    Start     Dose/Rate Route Frequency Ordered Stop   10/02/16 1400  ceFAZolin (ANCEF) IVPB 2g/100 mL premix     2 g 200 mL/hr over 30 Minutes Intravenous Every 8 hours 10/02/16 1324     10/01/16 2300  vancomycin (VANCOCIN) IVPB 1000 mg/200 mL premix  Status:  Discontinued     1,000 mg 200  mL/hr over 60 Minutes Intravenous Every 8 hours 10/01/16 1704 10/02/16 1314   09/27/16 1600  vancomycin (VANCOCIN) IVPB 750 mg/150 ml premix  Status:  Discontinued     750 mg 150 mL/hr over 60 Minutes Intravenous Every 8 hours 09/27/16 1556 10/01/16 1806   09/27/16 1600  ceFEPIme (MAXIPIME) 1 g in dextrose 5  % 50 mL IVPB  Status:  Discontinued     1 g 100 mL/hr over 30 Minutes Intravenous Every 8 hours 09/27/16 1556 10/02/16 1314   09/27/16 1400  metroNIDAZOLE (FLAGYL) tablet 500 mg  Status:  Discontinued     500 mg Oral Every 8 hours 09/27/16 1147 09/27/16 1550   09/27/16 1200  cefTRIAXone (ROCEPHIN) 2 g in dextrose 5 % 50 mL IVPB  Status:  Discontinued     2 g 100 mL/hr over 30 Minutes Intravenous Every 24 hours 09/27/16 1147 09/27/16 1550      Subjective: Questioning about going home  Objective: Vitals:   10/02/16 0442 10/02/16 0758 10/02/16 1320 10/02/16 1357  BP: 130/72 (!) 148/74 128/74 138/80  Pulse: 73 77 77 77  Resp: 17  17   Temp: 99.1 F (37.3 C)  99.9 F (37.7 C) 99.2 F (37.3 C)  TempSrc: Oral  Oral Oral  SpO2: 96%  95% 94%  Weight:      Height:        Intake/Output Summary (Last 24 hours) at 10/02/16 1608 Last data filed at 10/02/16 1535  Gross per 24 hour  Intake              480 ml  Output             1925 ml  Net            -1445 ml   Filed Weights   09/27/16 1110 09/27/16 1603  Weight: 73.9 kg (163 lb) 73.9 kg (163 lb)    Examination:  General exam: Awake, in nad Respiratory system: Normal resp effort, no audible wheezing Cardiovascular system: regular rate, s1-2 Gastrointestinal system: soft, nondistended, pos BS Central nervous system: cn2-12 grossly intact, strength intact Extremities: perfused, no clubbing Skin: normal skin turgor, no notable skin lesions seen Psychiatry: mood normal// no visual hallucinations  Data Reviewed: I have personally reviewed following labs and imaging studies  CBC:  Recent Labs Lab 09/27/16 1146 09/28/16 0451 09/29/16 0547 09/30/16 0314 10/01/16 0822 10/02/16 0817  WBC 15.7* 10.1 9.2 13.7* 15.7* 11.4*  NEUTROABS 13.0* 7.5  --   --   --   --   HGB 11.4* 9.2* 9.7* 9.4* 9.4* 9.5*  HCT 34.1* 28.3* 29.4* 28.1* 28.1* 29.0*  MCV 82.2 81.8 81.7 83.1 82.2 81.7  PLT 406* 325 334 311 288 035   Basic Metabolic  Panel:  Recent Labs Lab 09/28/16 0451 09/29/16 0547 09/30/16 0314 10/01/16 0822 10/02/16 0555  NA 138 137 134* 133* 132*  K 3.5 3.7 4.3 3.7 3.8  CL 101 97* 96* 96* 94*  CO2 29 32 30 29 31   GLUCOSE 90 174* 284* 194* 196*  BUN 8 8 8 11 8   CREATININE 0.63 0.78 0.81 0.88 0.75  CALCIUM 8.3* 8.3* 8.0* 7.8* 7.6*   GFR: Estimated Creatinine Clearance: 104 mL/min (by C-G formula based on SCr of 0.75 mg/dL). Liver Function Tests:  Recent Labs Lab 09/27/16 1146 09/28/16 0451  AST 25 16  ALT 20 16*  ALKPHOS 110 92  BILITOT 0.4 0.6  PROT 7.0 5.9*  ALBUMIN 2.2* 1.7*   No results for  input(s): LIPASE, AMYLASE in the last 168 hours. No results for input(s): AMMONIA in the last 168 hours. Coagulation Profile:  Recent Labs Lab 09/27/16 1643  INR 1.05   Cardiac Enzymes: No results for input(s): CKTOTAL, CKMB, CKMBINDEX, TROPONINI in the last 168 hours. BNP (last 3 results) No results for input(s): PROBNP in the last 8760 hours. HbA1C: No results for input(s): HGBA1C in the last 72 hours. CBG:  Recent Labs Lab 10/01/16 1224 10/01/16 1644 10/01/16 2122 10/02/16 0753 10/02/16 1155  GLUCAP 223* 288* 240* 227* 285*   Lipid Profile: No results for input(s): CHOL, HDL, LDLCALC, TRIG, CHOLHDL, LDLDIRECT in the last 72 hours. Thyroid Function Tests: No results for input(s): TSH, T4TOTAL, FREET4, T3FREE, THYROIDAB in the last 72 hours. Anemia Panel: No results for input(s): VITAMINB12, FOLATE, FERRITIN, TIBC, IRON, RETICCTPCT in the last 72 hours. Sepsis Labs:  Recent Labs Lab 09/27/16 1158 09/27/16 1442 09/27/16 1643 09/27/16 1947  PROCALCITON  --   --  <0.10  --   LATICACIDVEN 2.13* 2.77* 1.6 1.5    Recent Results (from the past 240 hour(s))  Culture, blood (Routine x 2)     Status: None   Collection Time: 09/27/16 11:38 AM  Result Value Ref Range Status   Specimen Description BLOOD RIGHT ANTECUBITAL  Final   Special Requests BOTTLES DRAWN AEROBIC AND ANAEROBIC  5CC  Final   Culture NO GROWTH 5 DAYS  Final   Report Status 10/02/2016 FINAL  Final  Wound or Superficial Culture     Status: None   Collection Time: 09/27/16 11:43 AM  Result Value Ref Range Status   Specimen Description WOUND  Final   Special Requests LEFT FOOT  Final   Gram Stain   Final    ABUNDANT WBC PRESENT, PREDOMINANTLY PMN FEW GRAM POSITIVE COCCI IN CLUSTERS FEW GRAM POSITIVE COCCI IN PAIRS    Culture NORMAL SKIN FLORA  Final   Report Status 09/29/2016 FINAL  Final  Culture, blood (Routine x 2)     Status: None   Collection Time: 09/27/16 11:44 AM  Result Value Ref Range Status   Specimen Description BLOOD RIGHT HAND  Final   Special Requests BOTTLES DRAWN AEROBIC AND ANAEROBIC 5CC  Final   Culture NO GROWTH 5 DAYS  Final   Report Status 10/02/2016 FINAL  Final  Surgical pcr screen     Status: None   Collection Time: 09/29/16  1:04 AM  Result Value Ref Range Status   MRSA, PCR NEGATIVE NEGATIVE Final   Staphylococcus aureus NEGATIVE NEGATIVE Final    Comment:        The Xpert SA Assay (FDA approved for NASAL specimens in patients over 36 years of age), is one component of a comprehensive surveillance program.  Test performance has been validated by [facility] for patients greater than or equal to 23 year old. It is not intended to diagnose infection nor to guide or monitor treatment.   Aerobic/Anaerobic Culture (surgical/deep wound)     Status: None (Preliminary result)   Collection Time: 09/29/16  6:55 PM  Result Value Ref Range Status   Specimen Description ABSCESS  Final   Special Requests LEFT FOOT  Final   Gram Stain   Final    FEW WBC PRESENT, PREDOMINANTLY PMN FEW GRAM POSITIVE COCCI IN CLUSTERS    Culture   Final    FEW STAPHYLOCOCCUS LUGDUNENSIS NO ANAEROBES ISOLATED; CULTURE IN PROGRESS FOR 5 DAYS    Report Status PENDING  Incomplete   Organism  ID, Bacteria STAPHYLOCOCCUS LUGDUNENSIS  Final      Susceptibility   Staphylococcus lugdunensis  - MIC*    CIPROFLOXACIN <=0.5 SENSITIVE Sensitive     ERYTHROMYCIN <=0.25 SENSITIVE Sensitive     GENTAMICIN <=0.5 SENSITIVE Sensitive     OXACILLIN <=0.25 SENSITIVE Sensitive     TETRACYCLINE <=1 SENSITIVE Sensitive     VANCOMYCIN 1 SENSITIVE Sensitive     TRIMETH/SULFA <=10 SENSITIVE Sensitive     CLINDAMYCIN <=0.25 SENSITIVE Sensitive     RIFAMPIN <=0.5 SENSITIVE Sensitive     Inducible Clindamycin NEGATIVE Sensitive     * FEW STAPHYLOCOCCUS LUGDUNENSIS     Radiology Studies: No results found.  Scheduled Meds: . atorvastatin  20 mg Oral q1800  .  ceFAZolin (ANCEF) IV  2 g Intravenous Q8H  . enoxaparin (LOVENOX) injection  40 mg Subcutaneous Q24H  . gabapentin  300 mg Oral QHS  . hydrochlorothiazide  25 mg Oral Q breakfast  . insulin aspart  0-9 Units Subcutaneous TID WC  . insulin aspart protamine- aspart  20 Units Subcutaneous BID WC  . lisinopril  2.5 mg Oral Daily   Continuous Infusions:   LOS: 5 days   CHIU, Orpah Melter, MD [facility] Pager [phone]  If 7PM-7AM, please contact night-coverage [email or web address] Password [email or web address] 10/02/2016, 4:08 PM

## 2016-10-02 NOTE — Progress Notes (Signed)
Inpatient Diabetes Program Recommendations  AACE/ADA: New Consensus Statement on Inpatient Glycemic Control (2015)  Target Ranges:  Prepandial:   less than 140 mg/dL      Peak postprandial:   less than 180 mg/dL (1-2 hours)      Critically ill patients:  140 - 180 mg/dL   Lab Results  Component Value Date   GLUCAP 227 (H) 10/02/2016   HGBA1C 13.2 (H) 09/27/2016    Review of Glycemic Control Results for Willie Jordan, Willie Jordan (MRN [id number]) as of 10/02/2016 11:28  Ref. Range 10/01/2016 07:18 10/01/2016 12:24 10/01/2016 16:44 10/01/2016 21:22 10/02/2016 07:53  Glucose-Capillary Latest Ref Range: 65 - 99 mg/dL 170 (H) 223 (H) 288 (H) 240 (H) 227 (H)   Inpatient Diabetes Program Recommendations:  Noted post prandial CBGs elevated.  Please consider: -Add Novolog correction 0-5 units hs -Increase 70/30 insulin to 22 units bid  Thank you, Nani Gasser. Husam Hohn, RN, MSN, CDE Inpatient Glycemic Control Team Team Pager [phone] (8am-5pm) 10/02/2016 11:29 AM

## 2016-10-03 DIAGNOSIS — E1165 Type 2 diabetes mellitus with hyperglycemia: Secondary | ICD-10-CM

## 2016-10-03 LAB — CBC
HCT: 27.7 % — ABNORMAL LOW (ref 39.0–52.0)
HEMOGLOBIN: 9.1 g/dL — AB (ref 13.0–17.0)
MCH: 26.8 pg (ref 26.0–34.0)
MCHC: 32.9 g/dL (ref 30.0–36.0)
MCV: 81.5 fL (ref 78.0–100.0)
Platelets: 278 10*3/uL (ref 150–400)
RBC: 3.4 MIL/uL — AB (ref 4.22–5.81)
RDW: 11.7 % (ref 11.5–15.5)
WBC: 8.7 10*3/uL (ref 4.0–10.5)

## 2016-10-03 LAB — BASIC METABOLIC PANEL
ANION GAP: 7 (ref 5–15)
BUN: 10 mg/dL (ref 6–20)
CALCIUM: 8.2 mg/dL — AB (ref 8.9–10.3)
CO2: 34 mmol/L — AB (ref 22–32)
Chloride: 96 mmol/L — ABNORMAL LOW (ref 101–111)
Creatinine, Ser: 0.86 mg/dL (ref 0.61–1.24)
Glucose, Bld: 142 mg/dL — ABNORMAL HIGH (ref 65–99)
Potassium: 4.5 mmol/L (ref 3.5–5.1)
Sodium: 137 mmol/L (ref 135–145)

## 2016-10-03 LAB — GLUCOSE, CAPILLARY
GLUCOSE-CAPILLARY: 168 mg/dL — AB (ref 65–99)
GLUCOSE-CAPILLARY: 233 mg/dL — AB (ref 65–99)
GLUCOSE-CAPILLARY: 168 mg/dL — AB (ref 65–99)
Glucose-Capillary: 85 mg/dL (ref 65–99)

## 2016-10-03 MED ORDER — ATORVASTATIN CALCIUM 40 MG PO TABS
40.0000 mg | ORAL_TABLET | Freq: Every day | ORAL | Status: DC
  Administered 2016-10-03 – 2016-10-04 (×2): 40 mg via ORAL
  Filled 2016-10-03 (×2): qty 1

## 2016-10-03 NOTE — Progress Notes (Signed)
PROGRESS NOTE    Willie Jordan  OJJ:[id number] DOB: 31-Oct-1965 DOA: 09/27/2016 PCP: Mack Hook, MD    Brief Narrative:  51 y.o.malewith medical history significant of HTN, hyperlipidemia, neuropathy, diabetes mellitus type 2 who presented to ED with left foot infected wound.Patient reported that on March 16th he came back from work and noticed the redness and swelling over the left foot. On March 19-th, he was seen at community clinic, had IM injection of ceftriaxone daily for 3 days. On March 20 patient underwent x-ray that showed no abscess, but gas accumulation in the soft tissues compatible with cellulitis. Patient was placed on oral Augmentin 875/125 mg twice a day on March 22-ndafter he completed the course of IM Rocephin. The following day in the office was noted that he developed some purulent drainage from the new opening between the left great and second toe left and area was debrided. He was seen in the [facility] for follow-up and sent to ED for increased swelling erythema and increased drainage from the wound  Assessment & Plan:   Principal Problem:   Diabetic foot infection ([place]) Active Problems:   HLD (hyperlipidemia)   Essential hypertension   Type 2 diabetes mellitus with hyperglycemia ([place])   Sepsis ([place])   Diabetes mellitus ([place])   Pyogenic inflammation of bone ([place])  Principal Problem: Sepsis associated with diabetic foot infection with abscess and osteomyelitis: s/p 2nd digital ray amp and I&D by podiatry on 3/26. Cultures grew pan-sensitive STAPHYLOCOCCUS LUGDUNENSIS.  - Abx transitioned vanc/cefepime > ancef per sensitivity data - Still febrile overnight, leukocytosis improving. - Continue pain control.  - Per podiatry, Dr. Amalia Hailey, continue daily dressing changes. Prognosis for limb salvage is fair, may require additional/more proximal surgery. - DME ordered per PT recommendations.  - OOB BID to regain functional mobility.    Diabetes  mellitus with diabetic neuropathy, uncontrolled: HbA1c 13.2%.  - Hold metformin - Increased insulin 70/30 to 22u BID and added mealtime insulin, CBGs returning to inpatient goal.   Hypertension: Chronic, stable - Continue HCTZ, lisinopril  Hyperlipidemia: Previously untreated. Given ASCVD risk from diabetes in his age range, should be on statin regardless. LDL 150mg /dl.  - Started lipitor 20mg , tolerating > will increase to high-intensity dose of 40mg .   DVT prophylaxis: Lovenox subQ Code Status: Full Family Communication: None at bedside Disposition Plan: [place] home once stable, likely in 24 - 48 hours.   Consultants:   Podiatry  Procedures:  1. 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 I&D LEFT FOOT  Antimicrobials: Anti-infectives    Start     Dose/Rate Route Frequency Ordered Stop   10/02/16 1400  ceFAZolin (ANCEF) IVPB 2g/100 mL premix     2 g 200 mL/hr over 30 Minutes Intravenous Every 8 hours 10/02/16 1324     10/01/16 2300  vancomycin (VANCOCIN) IVPB 1000 mg/200 mL premix  Status:  Discontinued     1,000 mg 200 mL/hr over 60 Minutes Intravenous Every 8 hours 10/01/16 1704 10/02/16 1314   09/27/16 1600  vancomycin (VANCOCIN) IVPB 750 mg/150 ml premix  Status:  Discontinued     750 mg 150 mL/hr over 60 Minutes Intravenous Every 8 hours 09/27/16 1556 10/01/16 1806   09/27/16 1600  ceFEPIme (MAXIPIME) 1 g in dextrose 5 % 50 mL IVPB  Status:  Discontinued     1 g 100 mL/hr over 30 Minutes Intravenous Every 8 hours 09/27/16 1556 10/02/16 1314   09/27/16 1400  metroNIDAZOLE (FLAGYL) tablet 500 mg  Status:  Discontinued     500 mg Oral Every 8 hours 09/27/16 1147 09/27/16 1550   09/27/16 1200  cefTRIAXone (ROCEPHIN) 2 g in dextrose 5 % 50 mL IVPB  Status:  Discontinued     2 g 100 mL/hr over 30 Minutes Intravenous Every 24 hours 09/27/16 1147 09/27/16 1550      Subjective: Pt felt feverish overnight, pain controlled. Eating ok. No dyspnea, chest pain.    Objective: Vitals:   10/02/16 1357 10/02/16 2246 10/03/16 0006 10/03/16 0615  BP: 138/80 131/62  134/70  Pulse: 77 80  70  Resp:  18  17  Temp: 99.2 F (37.3 C) (!) 101.6 F (38.7 C) 99.6 F (37.6 C) 98.9 F (37.2 C)  TempSrc: Oral Oral Oral Oral  SpO2: 94% 95%  96%  Weight:      Height:        Intake/Output Summary (Last 24 hours) at 10/03/16 1057 Last data filed at 10/03/16 1003  Gross per 24 hour  Intake             1320 ml  Output             3775 ml  Net            -2455 ml   Filed Weights   09/27/16 1110 09/27/16 1603  Weight: 73.9 kg (163 lb) 73.9 kg (163 lb)    Examination:  General exam: 51yo M in no distress.  Respiratory system: Normal resp effort, no audible wheezing Cardiovascular system: regular rate, s1-2 Gastrointestinal system: soft, nondistended, pos BS Central nervous system: cn2-12 grossly intact, strength intact Extremities: Left foot with gauze packing into open area s/p amputation and I&D with serosanguinous discharge without odor. Edema and erythema noted.  Psychiatry: Alert, oriented, normal mood and affect.  Data Reviewed: I have personally reviewed following labs and imaging studies  CBC:  Recent Labs Lab 09/27/16 1146 09/28/16 0451 09/29/16 0547 09/30/16 0314 10/01/16 0822 10/02/16 0817 10/03/16 0535  WBC 15.7* 10.1 9.2 13.7* 15.7* 11.4* 8.7  NEUTROABS 13.0* 7.5  --   --   --   --   --   HGB 11.4* 9.2* 9.7* 9.4* 9.4* 9.5* 9.1*  HCT 34.1* 28.3* 29.4* 28.1* 28.1* 29.0* 27.7*  MCV 82.2 81.8 81.7 83.1 82.2 81.7 81.5  PLT 406* 325 334 311 288 287 387   Basic Metabolic Panel:  Recent Labs Lab 09/29/16 0547 09/30/16 0314 10/01/16 0822 10/02/16 0555 10/03/16 0535  NA 137 134* 133* 132* 137  K 3.7 4.3 3.7 3.8 4.5  CL 97* 96* 96* 94* 96*  CO2 32 30 29 31  34*  GLUCOSE 174* 284* 194* 196* 142*  BUN 8 8 11 8 10   CREATININE 0.78 0.81 0.88 0.75 0.86  CALCIUM 8.3* 8.0* 7.8* 7.6* 8.2*   GFR: Estimated Creatinine Clearance:  96.7 mL/min (by C-G formula based on SCr of 0.86 mg/dL). Liver Function Tests:  Recent Labs Lab 09/27/16 1146 09/28/16 0451  AST 25 16  ALT 20 16*  ALKPHOS 110 92  BILITOT 0.4 0.6  PROT 7.0 5.9*  ALBUMIN 2.2* 1.7*   No results for input(s): LIPASE, AMYLASE in the last 168 hours. No results for input(s): AMMONIA in the last 168 hours. Coagulation Profile:  Recent Labs Lab 09/27/16 1643  INR 1.05   Cardiac Enzymes: No results for input(s): CKTOTAL, CKMB, CKMBINDEX, TROPONINI in the last 168 hours. BNP (last 3 results) No results for input(s): PROBNP in the last 8760 hours. HbA1C: No results for  input(s): HGBA1C in the last 72 hours. CBG:  Recent Labs Lab 10/02/16 0753 10/02/16 1155 10/02/16 1705 10/02/16 2129 10/03/16 0801  GLUCAP 227* 285* 193* 129* 168*   Lipid Profile: No results for input(s): CHOL, HDL, LDLCALC, TRIG, CHOLHDL, LDLDIRECT in the last 72 hours. Thyroid Function Tests: No results for input(s): TSH, T4TOTAL, FREET4, T3FREE, THYROIDAB in the last 72 hours. Anemia Panel: No results for input(s): VITAMINB12, FOLATE, FERRITIN, TIBC, IRON, RETICCTPCT in the last 72 hours. Sepsis Labs:  Recent Labs Lab 09/27/16 1158 09/27/16 1442 09/27/16 1643 09/27/16 1947  PROCALCITON  --   --  <0.10  --   LATICACIDVEN 2.13* 2.77* 1.6 1.5    Recent Results (from the past 240 hour(s))  Culture, blood (Routine x 2)     Status: None   Collection Time: 09/27/16 11:38 AM  Result Value Ref Range Status   Specimen Description BLOOD RIGHT ANTECUBITAL  Final   Special Requests BOTTLES DRAWN AEROBIC AND ANAEROBIC 5CC  Final   Culture NO GROWTH 5 DAYS  Final   Report Status 10/02/2016 FINAL  Final  Wound or Superficial Culture     Status: None   Collection Time: 09/27/16 11:43 AM  Result Value Ref Range Status   Specimen Description WOUND  Final   Special Requests LEFT FOOT  Final   Gram Stain   Final    ABUNDANT WBC PRESENT, PREDOMINANTLY PMN FEW GRAM POSITIVE  COCCI IN CLUSTERS FEW GRAM POSITIVE COCCI IN PAIRS    Culture NORMAL SKIN FLORA  Final   Report Status 09/29/2016 FINAL  Final  Culture, blood (Routine x 2)     Status: None   Collection Time: 09/27/16 11:44 AM  Result Value Ref Range Status   Specimen Description BLOOD RIGHT HAND  Final   Special Requests BOTTLES DRAWN AEROBIC AND ANAEROBIC 5CC  Final   Culture NO GROWTH 5 DAYS  Final   Report Status 10/02/2016 FINAL  Final  Surgical pcr screen     Status: None   Collection Time: 09/29/16  1:04 AM  Result Value Ref Range Status   MRSA, PCR NEGATIVE NEGATIVE Final   Staphylococcus aureus NEGATIVE NEGATIVE Final    Comment:        The Xpert SA Assay (FDA approved for NASAL specimens in patients over 83 years of age), is one component of a comprehensive surveillance program.  Test performance has been validated by [facility] for patients greater than or equal to 67 year old. It is not intended to diagnose infection nor to guide or monitor treatment.   Aerobic/Anaerobic Culture (surgical/deep wound)     Status: None (Preliminary result)   Collection Time: 09/29/16  6:55 PM  Result Value Ref Range Status   Specimen Description ABSCESS  Final   Special Requests LEFT FOOT  Final   Gram Stain   Final    FEW WBC PRESENT, PREDOMINANTLY PMN FEW GRAM POSITIVE COCCI IN CLUSTERS    Culture   Final    FEW STAPHYLOCOCCUS LUGDUNENSIS NO ANAEROBES ISOLATED; CULTURE IN PROGRESS FOR 5 DAYS    Report Status PENDING  Incomplete   Organism ID, Bacteria STAPHYLOCOCCUS LUGDUNENSIS  Final      Susceptibility   Staphylococcus lugdunensis - MIC*    CIPROFLOXACIN <=0.5 SENSITIVE Sensitive     ERYTHROMYCIN <=0.25 SENSITIVE Sensitive     GENTAMICIN <=0.5 SENSITIVE Sensitive     OXACILLIN <=0.25 SENSITIVE Sensitive     TETRACYCLINE <=1 SENSITIVE Sensitive     VANCOMYCIN 1 SENSITIVE  Sensitive     TRIMETH/SULFA <=10 SENSITIVE Sensitive     CLINDAMYCIN <=0.25 SENSITIVE Sensitive     RIFAMPIN  <=0.5 SENSITIVE Sensitive     Inducible Clindamycin NEGATIVE Sensitive     * FEW STAPHYLOCOCCUS LUGDUNENSIS     Radiology Studies: No results found.  Scheduled Meds: . atorvastatin  20 mg Oral q1800  .  ceFAZolin (ANCEF) IV  2 g Intravenous Q8H  . enoxaparin (LOVENOX) injection  40 mg Subcutaneous Q24H  . gabapentin  300 mg Oral QHS  . hydrochlorothiazide  25 mg Oral Q breakfast  . insulin aspart  0-15 Units Subcutaneous TID WC  . insulin aspart  0-5 Units Subcutaneous QHS  . insulin aspart protamine- aspart  22 Units Subcutaneous BID WC  . lisinopril  2.5 mg Oral Daily   Continuous Infusions:   LOS: 6 days   Vance Gather, MD [facility] Pager [phone]   If 7PM-7AM, please contact night-coverage [email or web address] Password [facility] 10/03/2016, 10:57 AM

## 2016-10-03 NOTE — Progress Notes (Signed)
Notified podiatry because of the discoloration of the foot.  Circulation is not a problem. Surgeon believes it is bruising from the surgery. Pulses are strong and 02 in the distal R extremity is 100%.

## 2016-10-04 LAB — GLUCOSE, CAPILLARY
GLUCOSE-CAPILLARY: 165 mg/dL — AB (ref 65–99)
GLUCOSE-CAPILLARY: 97 mg/dL (ref 65–99)
Glucose-Capillary: 155 mg/dL — ABNORMAL HIGH (ref 65–99)
Glucose-Capillary: 134 mg/dL — ABNORMAL HIGH (ref 65–99)

## 2016-10-04 LAB — AEROBIC/ANAEROBIC CULTURE (SURGICAL/DEEP WOUND)

## 2016-10-04 LAB — AEROBIC/ANAEROBIC CULTURE W GRAM STAIN (SURGICAL/DEEP WOUND)

## 2016-10-04 MED ORDER — CEPHALEXIN 500 MG PO CAPS
500.0000 mg | ORAL_CAPSULE | Freq: Three times a day (TID) | ORAL | Status: DC
  Administered 2016-10-04 – 2016-10-05 (×4): 500 mg via ORAL
  Filled 2016-10-04 (×4): qty 1

## 2016-10-04 NOTE — Progress Notes (Signed)
PROGRESS NOTE    Willie Jordan  BSJ:[id number] DOB: 1966-06-20 DOA: 09/27/2016 PCP: Mack Hook, MD    Brief Narrative:  51 y.o.malewith medical history significant of HTN, hyperlipidemia, neuropathy, diabetes mellitus type 2 who presented to ED with left foot infected wound.Patient reported that on March 16th he came back from work and noticed the redness and swelling over the left foot. On March 19-th, he was seen at community clinic, had IM injection of ceftriaxone daily for 3 days. On March 20 patient underwent x-ray that showed no abscess, but gas accumulation in the soft tissues compatible with cellulitis. Patient was placed on oral Augmentin 875/125 mg twice a day on March 22-ndafter he completed the course of IM Rocephin. The following day in the office was noted that he developed some purulent drainage from the new opening between the left great and second toe left and area was debrided. He was seen in the [facility] for follow-up and sent to ED for increased swelling erythema and increased drainage from the wound  Assessment & Plan:   Principal Problem:   Diabetic foot infection ([place]) Active Problems:   HLD (hyperlipidemia)   Essential hypertension   Type 2 diabetes mellitus with hyperglycemia ([place])   Sepsis ([place])   Diabetes mellitus ([place])   Pyogenic inflammation of bone ([place])  Principal Problem: Sepsis associated with diabetic foot infection with abscess and osteomyelitis: s/p 2nd digital ray amp and I&D by podiatry on 3/26. Cultures grew pan-sensitive STAPHYLOCOCCUS LUGDUNENSIS.  - Abx transitioned vanc/cefepime > ancef per sensitivity data > will transition to po, and DC home if stable in AM.  - Still afebrile since 3/29, leukocytosis resolved. - Continue pain control.  - Per podiatry, Dr. Amalia Hailey, continue daily dressing changes. Prognosis for limb salvage is fair, may require additional/more proximal surgery, to be evaluated at follow up. - Have  discussed with nursing staff who will teach and have patient demonstrate ability to change dressing daily.  - DME ordered per PT recommendations.  - OOB BID to regain functional mobility.    Diabetes mellitus with diabetic neuropathy, uncontrolled: HbA1c 13.2%.  - Hold metformin - Increased insulin 70/30 to 22u BID and added mealtime insulin, CBGs returning to inpatient goal.  - Patient complaining of blurry vision without red flags on exam or pain, strong suspicion for osmotic effect of controlling blood sugar. Will continue to monitor.   Hypertension: Chronic, stable - Continue HCTZ, lisinopril  Hyperlipidemia: Previously untreated. Given ASCVD risk from diabetes in his age range, should be on statin regardless. LDL 150mg /dl.  - Started lipitor 20mg , tolerating > will increase to high-intensity dose of 40mg .   DVT prophylaxis: Lovenox subQ Code Status: Full Family Communication: None at bedside Disposition Plan: [place] home once stable, likely in next 24 hours.  Consultants:   Podiatry, Dr. Amalia Hailey  Procedures:  1. 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 I&D LEFT FOOT  Antimicrobials: Anti-infectives    Start     Dose/Rate Route Frequency Ordered Stop   10/02/16 1400  ceFAZolin (ANCEF) IVPB 2g/100 mL premix     2 g 200 mL/hr over 30 Minutes Intravenous Every 8 hours 10/02/16 1324     10/01/16 2300  vancomycin (VANCOCIN) IVPB 1000 mg/200 mL premix  Status:  Discontinued     1,000 mg 200 mL/hr over 60 Minutes Intravenous Every 8 hours 10/01/16 1704 10/02/16 1314   09/27/16 1600  vancomycin (VANCOCIN) IVPB 750 mg/150 ml premix  Status:  Discontinued     750  mg 150 mL/hr over 60 Minutes Intravenous Every 8 hours 09/27/16 1556 10/01/16 1806   09/27/16 1600  ceFEPIme (MAXIPIME) 1 g in dextrose 5 % 50 mL IVPB  Status:  Discontinued     1 g 100 mL/hr over 30 Minutes Intravenous Every 8 hours 09/27/16 1556 10/02/16 1314   09/27/16 1400  metroNIDAZOLE (FLAGYL) tablet 500 mg   Status:  Discontinued     500 mg Oral Every 8 hours 09/27/16 1147 09/27/16 1550   09/27/16 1200  cefTRIAXone (ROCEPHIN) 2 g in dextrose 5 % 50 mL IVPB  Status:  Discontinued     2 g 100 mL/hr over 30 Minutes Intravenous Every 24 hours 09/27/16 1147 09/27/16 1550      Subjective: No overnight events, afebrile. Pain is controlled. Reports waxing/waning blurry vision without pain, redness, double vision, or visual field deficits. Eating ok. No dyspnea, chest pain.   Objective: Vitals:   10/03/16 1350 10/03/16 2105 10/04/16 0512 10/04/16 1337  BP: 123/69 (!) 130/56 (!) 151/75 127/72  Pulse: 66 72 68 70  Resp: 18 17 18 17   Temp: 97.7 F (36.5 C) 98.7 F (37.1 C) 97.7 F (36.5 C) 98.5 F (36.9 C)  TempSrc: Oral Oral Oral Oral  SpO2: 98% 97% 100% 97%  Weight:      Height:        Intake/Output Summary (Last 24 hours) at 10/04/16 1339 Last data filed at 10/04/16 1338  Gross per 24 hour  Intake              940 ml  Output             3550 ml  Net            -2610 ml   Filed Weights   09/27/16 1110 09/27/16 1603  Weight: 73.9 kg (163 lb) 73.9 kg (163 lb)   General exam: 51yo M in no distress.  Respiratory system: Normal resp effort, no audible wheezing Cardiovascular system: regular rate, s1-2 Gastrointestinal system: soft, nondistended, pos BS Central nervous system: cn2-12 grossly intact, strength intact Extremities: Left foot with gauze packing into open area s/p amputation and I&D with scant serosanguinous discharge without odor. Edema and erythema noted. Diffuse hyperpigmentation consistent with bruise. Other digits are warm with good cap refill.  Psychiatry: Alert, oriented, normal mood and affect.  Data Reviewed: I have personally reviewed following labs and imaging studies  CBC:  Recent Labs Lab 09/28/16 0451 09/29/16 0547 09/30/16 0314 10/01/16 0822 10/02/16 0817 10/03/16 0535  WBC 10.1 9.2 13.7* 15.7* 11.4* 8.7  NEUTROABS 7.5  --   --   --   --   --   HGB  9.2* 9.7* 9.4* 9.4* 9.5* 9.1*  HCT 28.3* 29.4* 28.1* 28.1* 29.0* 27.7*  MCV 81.8 81.7 83.1 82.2 81.7 81.5  PLT 325 334 311 288 287 270   Basic Metabolic Panel:  Recent Labs Lab 09/29/16 0547 09/30/16 0314 10/01/16 0822 10/02/16 0555 10/03/16 0535  NA 137 134* 133* 132* 137  K 3.7 4.3 3.7 3.8 4.5  CL 97* 96* 96* 94* 96*  CO2 32 30 29 31  34*  GLUCOSE 174* 284* 194* 196* 142*  BUN 8 8 11 8 10   CREATININE 0.78 0.81 0.88 0.75 0.86  CALCIUM 8.3* 8.0* 7.8* 7.6* 8.2*   GFR: Estimated Creatinine Clearance: 96.7 mL/min (by C-G formula based on SCr of 0.86 mg/dL). Liver Function Tests:  Recent Labs Lab 09/28/16 0451  AST 16  ALT 16*  ALKPHOS 92  BILITOT 0.6  PROT 5.9*  ALBUMIN 1.7*   No results for input(s): LIPASE, AMYLASE in the last 168 hours. No results for input(s): AMMONIA in the last 168 hours. Coagulation Profile:  Recent Labs Lab 09/27/16 1643  INR 1.05   Cardiac Enzymes: No results for input(s): CKTOTAL, CKMB, CKMBINDEX, TROPONINI in the last 168 hours. BNP (last 3 results) No results for input(s): PROBNP in the last 8760 hours. HbA1C: No results for input(s): HGBA1C in the last 72 hours. CBG:  Recent Labs Lab 10/03/16 1133 10/03/16 1650 10/03/16 2104 10/04/16 0746 10/04/16 1207  GLUCAP 233* 168* 85 155* 134*   Lipid Profile: No results for input(s): CHOL, HDL, LDLCALC, TRIG, CHOLHDL, LDLDIRECT in the last 72 hours. Thyroid Function Tests: No results for input(s): TSH, T4TOTAL, FREET4, T3FREE, THYROIDAB in the last 72 hours. Anemia Panel: No results for input(s): VITAMINB12, FOLATE, FERRITIN, TIBC, IRON, RETICCTPCT in the last 72 hours. Sepsis Labs:  Recent Labs Lab 09/27/16 1442 09/27/16 1643 09/27/16 1947  PROCALCITON  --  <0.10  --   LATICACIDVEN 2.77* 1.6 1.5    Recent Results (from the past 240 hour(s))  Culture, blood (Routine x 2)     Status: None   Collection Time: 09/27/16 11:38 AM  Result Value Ref Range Status   Specimen  Description BLOOD RIGHT ANTECUBITAL  Final   Special Requests BOTTLES DRAWN AEROBIC AND ANAEROBIC 5CC  Final   Culture NO GROWTH 5 DAYS  Final   Report Status 10/02/2016 FINAL  Final  Wound or Superficial Culture     Status: None   Collection Time: 09/27/16 11:43 AM  Result Value Ref Range Status   Specimen Description WOUND  Final   Special Requests LEFT FOOT  Final   Gram Stain   Final    ABUNDANT WBC PRESENT, PREDOMINANTLY PMN FEW GRAM POSITIVE COCCI IN CLUSTERS FEW GRAM POSITIVE COCCI IN PAIRS    Culture NORMAL SKIN FLORA  Final   Report Status 09/29/2016 FINAL  Final  Culture, blood (Routine x 2)     Status: None   Collection Time: 09/27/16 11:44 AM  Result Value Ref Range Status   Specimen Description BLOOD RIGHT HAND  Final   Special Requests BOTTLES DRAWN AEROBIC AND ANAEROBIC 5CC  Final   Culture NO GROWTH 5 DAYS  Final   Report Status 10/02/2016 FINAL  Final  Surgical pcr screen     Status: None   Collection Time: 09/29/16  1:04 AM  Result Value Ref Range Status   MRSA, PCR NEGATIVE NEGATIVE Final   Staphylococcus aureus NEGATIVE NEGATIVE Final    Comment:        The Xpert SA Assay (FDA approved for NASAL specimens in patients over 72 years of age), is one component of a comprehensive surveillance program.  Test performance has been validated by [facility] for patients greater than or equal to 61 year old. It is not intended to diagnose infection nor to guide or monitor treatment.   Aerobic/Anaerobic Culture (surgical/deep wound)     Status: None   Collection Time: 09/29/16  6:55 PM  Result Value Ref Range Status   Specimen Description ABSCESS  Final   Special Requests LEFT FOOT  Final   Gram Stain   Final    FEW WBC PRESENT, PREDOMINANTLY PMN FEW GRAM POSITIVE COCCI IN CLUSTERS    Culture   Final    FEW STAPHYLOCOCCUS LUGDUNENSIS NO ANAEROBES ISOLATED    Report Status 10/04/2016 FINAL  Final   Organism  ID, Bacteria STAPHYLOCOCCUS LUGDUNENSIS  Final        Susceptibility   Staphylococcus lugdunensis - MIC*    CIPROFLOXACIN <=0.5 SENSITIVE Sensitive     ERYTHROMYCIN <=0.25 SENSITIVE Sensitive     GENTAMICIN <=0.5 SENSITIVE Sensitive     OXACILLIN <=0.25 SENSITIVE Sensitive     TETRACYCLINE <=1 SENSITIVE Sensitive     VANCOMYCIN 1 SENSITIVE Sensitive     TRIMETH/SULFA <=10 SENSITIVE Sensitive     CLINDAMYCIN <=0.25 SENSITIVE Sensitive     RIFAMPIN <=0.5 SENSITIVE Sensitive     Inducible Clindamycin NEGATIVE Sensitive     * FEW STAPHYLOCOCCUS LUGDUNENSIS     Radiology Studies: No results found.  Scheduled Meds: . atorvastatin  40 mg Oral q1800  .  ceFAZolin (ANCEF) IV  2 g Intravenous Q8H  . enoxaparin (LOVENOX) injection  40 mg Subcutaneous Q24H  . gabapentin  300 mg Oral QHS  . hydrochlorothiazide  25 mg Oral Q breakfast  . insulin aspart  0-15 Units Subcutaneous TID WC  . insulin aspart  0-5 Units Subcutaneous QHS  . insulin aspart protamine- aspart  22 Units Subcutaneous BID WC  . lisinopril  2.5 mg Oral Daily   Continuous Infusions:   LOS: 7 days   Vance Gather, MD [facility] Pager [phone]   If 7PM-7AM, please contact night-coverage [email or web address] Password [email or web address] 10/04/2016, 1:39 PM

## 2016-10-04 NOTE — Progress Notes (Signed)
Showed patient how to pack wound with NS moist  Gauze. He showed willingness and ability to independently do his dressing changes at home.

## 2016-10-05 ENCOUNTER — Emergency Department (HOSPITAL_COMMUNITY): Admission: EM | Admit: 2016-10-05 | Discharge: 2016-10-05 | Discharge status: Absent without leave | Payer: Self-pay

## 2016-10-05 LAB — GLUCOSE, CAPILLARY
Glucose-Capillary: 133 mg/dL — ABNORMAL HIGH (ref 65–99)
Glucose-Capillary: 240 mg/dL — ABNORMAL HIGH (ref 65–99)

## 2016-10-05 MED ORDER — ATORVASTATIN CALCIUM 40 MG PO TABS: 40.0000 mg | ORAL_TABLET | Freq: Every day | ORAL | 0 refills | Status: DC

## 2016-10-05 MED ORDER — HYDROCODONE-ACETAMINOPHEN 5-325 MG PO TABS: 1.0000 | ORAL_TABLET | ORAL | 0 refills | Status: DC | PRN

## 2016-10-05 MED ORDER — BLOOD GLUCOSE METER KIT: PACK | 0 refills | Status: AC

## 2016-10-05 MED ORDER — INSULIN NPH ISOPHANE & REGULAR (70-30) 100 UNIT/ML ~~LOC~~ SUSP
22.0000 [IU] | Freq: Two times a day (BID) | SUBCUTANEOUS | 0 refills | Status: DC
Start: 2016-10-05 — End: 2016-10-28

## 2016-10-05 MED ORDER — CEPHALEXIN 500 MG PO CAPS
500.0000 mg | ORAL_CAPSULE | Freq: Three times a day (TID) | ORAL | 0 refills | Status: DC
Start: 2016-10-05 — End: 2016-10-08

## 2016-10-05 NOTE — Progress Notes (Signed)
Contacted [facility] Liaison for [facility]. Contacted DME rep for wheelchair and 3n1 bedside commode. Will provide pt with MATCH. Jonnie Finner RN CCM Case Mgmt phone [phone]

## 2016-10-05 NOTE — ED Notes (Signed)
Pt came to ED to advise that antibiotic was called to [facility], [facility] closed today when pt went to pick up prescription.  Wants to start antibiotic tonight.  Called [facility] spoke with Izora Gala charge nurse who advised pt will need to pick up prescription in the am.  Advised pt of CN instructions, pt OK with that plan.

## 2016-10-05 NOTE — Progress Notes (Signed)
Discharge paperwork reviewed with patient. IV removed. No questions verbalized. Patient is ready for discharge.

## 2016-10-05 NOTE — Discharge Summary (Signed)
Physician Discharge Summary  Willie Jordan IRS:[id number] DOB: 09/21/1965 DOA: 09/27/2016  PCP: Mack Hook, MD  Admit date: 09/27/2016 Discharge date: 10/05/2016  Admitted From: Home Disposition: Home   Recommendations for Outpatient Follow-up:  1. Follow up with PCP, continue dressing changes daily. Pt states he has been going to his PCP for his.  2. Continue diabetes management. Added lipitor and increased insulin as below to 70/30 22 units BID. 3. Follow up with Dr. Amalia Hailey, podiatry, within the next week. 4. Please obtain BMP/CBC in one week  Home Health: RN for wound dressing changes. Equipment/Devices: Rolling walker, 3-in-1, manual wheelchair Discharge Condition: Stable CODE STATUS: Full Diet recommendation: Heart healthy, carb-modified  Brief/Interim Summary: 51 y.o.malewith medical history significant of HTN, hyperlipidemia, neuropathy, diabetes mellitus type 2 who presented to ED with left foot infected wound. Patient reported that on March 16th he came back from work and noticed the redness and swelling over the left foot. On March 19th, he was seen at community [facility], had IM injection of ceftriaxone daily for 3 days. On March 20 patient underwent x-ray that showed no abscess, but gas accumulation in the soft tissues compatible with cellulitis. Patient was placed on oral Augmentin 875/125 mg twice a day on March 22ndafter he completed the course of IM Rocephin. The following day in the office was noted that he developed some purulent drainage from the new opening between the left great and second toe left and area was debrided. [facility] and sent to ED for increased swelling erythema and increased drainage from the wound. The patient underwent operative management by podiatry, Dr. 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 He has remained stable on antibiotics targeted at pan-sensitive staph. species. He has demonstrated  ability to change the dressing himself daily as directed by podiatry and his pain is controlled on oral pain medications. He has worked with physical therapy and is stable for discharge to home with DME and home health. He will follow up with his PCP and with Dr. Amalia Hailey within the week.   Discharge Diagnoses:  Principal Problem:   Diabetic foot infection ([place]) Active Problems:   HLD (hyperlipidemia)   Essential hypertension   Type 2 diabetes mellitus with hyperglycemia (HCC)   Sepsis ([place])   Diabetes mellitus ([place])   Pyogenic inflammation of bone ([place])  Sepsis associated with diabetic foot infection with abscess and osteomyelitis: s/p 2nd digital ray amp and I&D by podiatry on 3/26. Cultures grew pan-sensitive STAPHYLOCOCCUS LUGDUNENSIS.  - Abx transitioned vanc/cefepime > ancef per sensitivity data > will transition to keflex po  - Still afebrile since 3/29, leukocytosis resolved. - Continue pain control.  - Per podiatry, Dr. Amalia Hailey, continue daily dressing changes. Prognosis for limb salvage is fair, may require additional/more proximal surgery, to be evaluated at follow up. - Pt has received teaching and demonstrated ability to change dressing. Says he will also be able to follow up with his PCP's office. 4/2 - DME ordered per PT recommendations.   Diabetes mellitus with diabetic neuropathy, uncontrolled: HbA1c 13.2%.  - Hold metformin - Increased insulin 70/30 to 22u BID. Prescribed glucose meter and test strips at discharge, with directions to check sugars QID and present readings to PCP.  - Patient complaining of blurry vision without red flags on exam or pain, strong suspicion for osmotic effect of controlling blood sugar. This has improved at discharge.   Hypertension: Chronic, stable - Continue HCTZ, lisinopril  Hyperlipidemia: Previously untreated.  Given ASCVD risk from diabetes in his age range, should be on statin regardless. LDL 128m/dl.  - Started lipitor 266m  tolerating > will increase to high-intensity dose of 4012m  Discharge Instructions Discharge Instructions    Diet - low sodium heart healthy    Complete by:  As directed    Discharge instructions    Complete by:  As directed    You were admitted for a foot infection that has improved after amputation and drainage. The wound is stable and antibiotics have been effective, so you are stable for discharge with the following recommendations:  - Continue taking keflex 3 times daily until you run out of medications.  - Continue taking lipitor (this was sent to your pharmacy) to treat high cholesterol.  - Continue taking insulin, but increase the dose as below to 22 units of 70/30 insulin twice daily. - Check your blood sugars 4 times daily, once in the early morning, then 3 times with meals. Record these numbers and take them to your follow up appointment with your PCP. Glucose meter strips have been prescribed for you at discharge.  - You may take norco as needed, as directed for severe pain. Otherwise, take tylenol.  - Call Dr. EvaAmalia Haileyffice tomorrow to schedule a follow up appointment early this week.  - Continue changing the bandage daily.  - If the wound starts hurting more, becoming more red, or you develop fevers, seek medical attention right away.   Increase activity slowly    Complete by:  As directed      Allergies as of 10/05/2016      Reactions   Aspirin Rash      Medication List    STOP taking these medications   amoxicillin-clavulanate 875-125 MG tablet Commonly known as:  AUGMENTIN     TAKE these medications   atorvastatin 40 MG tablet Commonly known as:  LIPITOR Take 1 tablet (40 mg total) by mouth daily at 6 PM.   blood glucose meter kit and supplies Dispense based on patient and insurance preference. Use up to four times daily as directed. (FOR ICD-9 250.00, 250.01).   cephALEXin 500 MG capsule Commonly known as:  KEFLEX Take 1 capsule (500 mg total) by mouth  every 8 (eight) hours.   gabapentin 100 MG capsule Commonly known as:  NEURONTIN 3 caps by mouth at bedtime What changed:  how much to take  how to take this  when to take this  additional instructions   hydrochlorothiazide 25 MG tablet Commonly known as:  HYDRODIURIL Take 1 tablet (25 mg total) by mouth daily with breakfast.   HYDROcodone-acetaminophen 5-325 MG tablet Commonly known as:  NORCO/VICODIN Take 1-2 tablets by mouth every 4 (four) hours as needed for moderate pain.   insulin NPH-regular Human (70-30) 100 UNIT/ML injection Commonly known as:  NOVOLIN 70/30 Inject 22 Units into the skin 2 (two) times daily with a meal. What changed:  how much to take   lisinopril 2.5 MG tablet Commonly known as:  PRINIVIL,ZESTRIL Take 1 tablet (2.5 mg total) by mouth daily.   metFORMIN 500 MG 24 hr tablet Commonly known as:  GLUCOPHAGE-XR 2 tabs by mouth with breakfast daily What changed:  how much to take  how to take this  when to take this  additional instructions   VITAMIN B-12 PO Take 1 tablet by mouth daily.            Durable Medical Equipment  Start     Ordered   10/01/16 1103  For home use only DME standard manual wheelchair with seat cushion  Once    Comments:  Patient suffers from 1. 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 I&D LEFT FOOT which impairs their ability to perform daily activities like ambulating  in the home.  A cane will not resolve  issue with performing activities of daily living. A wheelchair will allow patient to safely perform daily activities. Patient can safely propel the wheelchair in the home or has a caregiver who can provide assistance.  Accessories: elevating leg rests (ELRs), wheel locks, extensions and anti-tippers.   10/01/16 1103   09/30/16 1447  For home use only DME 3 n 1  Once     09/30/16 1446   09/30/16 1446  For home use only DME Walker rolling  Once    Question:  Patient needs a walker to treat with the  following condition  Answer:  Toe amputation status, left ([place])   09/30/16 1446     Follow-up Information    MULBERRY,ELIZABETH, MD. Call in 1 day(s).   Specialty:  Internal Medicine Contact information: [place] [address] [place], DPM. Schedule an appointment as soon as possible for a visit in 1 day(s).   Specialty:  Podiatry Contact information: [address] [place] [address] [phone]          Allergies  Allergen Reactions  . Aspirin Rash    Consultations:  Dr. Amalia Hailey, Podiatry  Procedures/Studies: Mr Foot Left W Wo Contrast  Addendum Date: 09/28/2016   ADDENDUM REPORT: 09/28/2016 09:50 ADDENDUM: These results were called by telephone at the time of interpretation on 09/28/2016 at 9:49 am to Dr. York Grice , who verbally acknowledged these results. Timely orthopedic consultation recommended. Electronically Signed   By: Richardean Sale M.D.   On: 09/28/2016 09:50   Result Date: 09/28/2016 CLINICAL DATA:  Diabetic with nonhealing wound dorsally between the first and second toes. History of neuropathy. Sepsis. EXAM: MRI OF THE LEFT FOREFOOT WITHOUT AND WITH CONTRAST TECHNIQUE: Multiplanar, multisequence MR imaging of the left forefoot was performed both before and after administration of intravenous contrast. CONTRAST:  68m MULTIHANCE GADOBENATE DIMEGLUMINE 529 MG/ML IV SOLN COMPARISON:  Radiographs 09/27/2016. FINDINGS: Bones/Joint/Cartilage There is a large ill-defined, complex fluid collection within the first web space. This collection is best defined on the post-contrast images. There is relatively limited dorsal extension along the distal half of the second metatarsal shaft and second proximal phalanx. There is more extensive plantar extension along the first and second metatarsals and proximal phalanges. There are multiple foci of low signal within these fluid collections, corresponding with soft tissue  emphysema on the patient's radiographs. There is irregular enhancement of these collections following contrast. Approximate overall dimensions are 6.2 x 4.2 x 4.3 cm. There is probable extension to the skin dorsally at the first web space. The second proximal phalanx demonstrates abnormally decreased T1 and increased T2 signal with cortical irregularity highly suspicious for osteomyelitis. There is diffuse marrow enhancement following contrast. There is also T2 hyperintensity and enhancement within the second metatarsal head and neck which could reflect osteomyelitis. There is synovial enhancement of the second metatarsal phalangeal joint. There is some low-level T2 hyperintensity and enhancement within the first toe phalanges and the sesamoids of the first metatarsal. There is no associated abnormal T1 signal or cortical destruction. There is no  suspicious signal in the first metatarsal or the third through fifth rays. Ligaments The Lisfranc ligament is intact. Muscles and Tendons The complex fluid collections described above partially envelope the flexor tendons of the first and second toes and the extensor tendon of the second toe at the level of the metatarsals. This is worrisome for infectious tenosynovitis. There is extensive T2 hyperintensity and low level enhancement throughout the forefoot musculature. Soft tissues As above, there is a complex fluid collection extending both dorsal and plantar from the first web space consistent with an abscess. In addition, there is generalized subcutaneous edema throughout the forefoot consistent with cellulitis. IMPRESSION: 1. Large complex fluid collection medially in the forefoot, extending from the second web space into the dorsal and plantar aspects of the foot, consistent with an abscess. There is proximal extension to the level of the mid metatarsal shafts. 2. Associated synovitis of the second metatarsal phalangeal joint consistent with septic joint, and complex  first and second ray tenosynovitis worrisome for infectious tenosynovitis. 3. Findings are highly worrisome for osteomyelitis of the second proximal phalanx. There is also probable early osteomyelitis of the second metatarsal head and neck. Less specific marrow changes within the phalanges of the great toe may be reactive, although early osteomyelitis difficult to exclude. 4. Generalized forefoot cellulitis. Electronically Signed: By: Richardean Sale M.D. On: 09/28/2016 09:31   Dg Chest Port 1 View  Result Date: 09/27/2016 CLINICAL DATA:  Osteomyelitis EXAM: PORTABLE CHEST 1 VIEW COMPARISON:  05/29/2007 FINDINGS: Cardiomediastinal silhouette is unremarkable. No infiltrate or pleural effusion. No pulmonary edema. Mild mid thoracic dextroscoliosis. IMPRESSION: No active disease. Electronically Signed   By: Lahoma Crocker M.D.   On: 09/27/2016 16:26   Dg Foot Complete Left  Result Date: 09/29/2016 CLINICAL DATA:  Postop from second toe amputation for osteomyelitis. EXAM: LEFT FOOT - COMPLETE 3+ VIEW COMPARISON:  09/27/2016 FINDINGS: There has been interval resection of the second toe at the level of the mid second metatarsal. Postoperative soft tissue swelling gas seen in this region, as well as skin staples in the mid and hindfoot. No other acute bone abnormality identified. Prominent plantar and dorsal calcaneal spurs again noted. IMPRESSION: Expected postoperative appearance status post left second toe amputation. No unexpected findings. Electronically Signed   By: Earle Gell M.D.   On: 09/29/2016 20:05   Dg Foot Complete Left  Result Date: 09/27/2016 CLINICAL DATA:  51 year old male with a history of foot infection EXAM: LEFT FOOT - COMPLETE 3+ VIEW COMPARISON:  09/23/2016 FINDINGS: Persisting subcutaneous/ myofacial gas of the forefoot, between the first and second digits and second and third digits, as well as extension between the first and second metatarsals on the AP view. Erosive changes at the base  of the second proximal phalanx again demonstrated. Degenerative changes of the hindfoot and midfoot. IMPRESSION: Re- demonstration of subcutaneous/myofacial gas involving the left forefoot, as above, compatible with infection. Although the gas has not progressed significantly from the comparison plain film, necrotizing fasciitis cannot be excluded radiographically. Re- demonstration of potential osteomyelitis involving the base of the second proximal phalanx. Electronically Signed   By: Corrie Mckusick D.O.   On: 09/27/2016 12:28   Dg Foot Complete Left  Result Date: 09/23/2016 CLINICAL DATA:  Diabetic neuropathy, cellulitis, infection EXAM: LEFT FOOT - COMPLETE 3+ VIEW COMPARISON:  Unavailable FINDINGS: Soft tissue swelling and soft tissue air present of the left foot along the first and second toes. Soft tissue air extends along the dorsum of the foot and  the plantar surface on the lateral view compatible with localized cellulitis from a gas-forming organism. No acute fracture or malalignment. No radiopaque or metallic foreign body. On the oblique view, there is minimal osseous irregularity of the second toe proximal phalanx at the second MTP joint suspicious for early osteomyelitis, best appreciated on the oblique view. IMPRESSION: Left foot soft tissue swelling and soft tissue gas along the first and second toes but extending on the dorsum of the foot and plantar surface on the lateral view. Findings compatible with localized cellulitis as detailed above. Subtle osseous irregularity of the left second toe proximal phalanx at the second MTP joint suspicious for early osteomyelitis. No radiopaque foreign body. Electronically Signed   By: Jerilynn Mages.  Shick M.D.   On: 09/23/2016 16:51   1. 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 I&D LEFT FOOT  Subjective: No overnight events, remains afebrile. Pain is controlled. Blurry vision improved. Eating ok. No dyspnea, chest pain.  Discharge Exam: Vitals:   10/04/16 2047  10/05/16 0547  BP: 136/74 125/72  Pulse: 69 64  Resp: 18 17  Temp: 98.3 F (36.8 C) 97.9 F (36.6 C)   General: 51yo M in no distress Cardiovascular: RRR, no murmur Respiratory: Nonlabored, clear Abdominal: Soft, NT Extremities: Left foot with gauze packing into open area s/p amputation and I&D with scant serosanguinous discharge without odor. Edema and erythema noted, improving. Sutures in place c/d/I without dehiscence. Diffuse hyperpigmentation consistent with bruise. Other digits are warm with good cap refill.   Labs: Basic Metabolic Panel:  Recent Labs Lab 09/29/16 0547 09/30/16 0314 10/01/16 0822 10/02/16 0555 10/03/16 0535  NA 137 134* 133* 132* 137  K 3.7 4.3 3.7 3.8 4.5  CL 97* 96* 96* 94* 96*  CO2 32 '30 29 31 ' 34*  GLUCOSE 174* 284* 194* 196* 142*  BUN '8 8 11 8 10  ' CREATININE 0.78 0.81 0.88 0.75 0.86  CALCIUM 8.3* 8.0* 7.8* 7.6* 8.2*   CBC:  Recent Labs Lab 09/29/16 0547 09/30/16 0314 10/01/16 0822 10/02/16 0817 10/03/16 0535  WBC 9.2 13.7* 15.7* 11.4* 8.7  HGB 9.7* 9.4* 9.4* 9.5* 9.1*  HCT 29.4* 28.1* 28.1* 29.0* 27.7*  MCV 81.7 83.1 82.2 81.7 81.5  PLT 334 311 288 287 278   CBG:  Recent Labs Lab 10/04/16 0746 10/04/16 1207 10/04/16 1708 10/04/16 2037 10/05/16 0804  GLUCAP 155* 134* 165* 97 133*   Urinalysis    Component Value Date/Time   COLORURINE AMBER (A) 09/27/2016 1143   APPEARANCEUR HAZY (A) 09/27/2016 1143   LABSPEC 1.016 09/27/2016 1143   PHURINE 5.0 09/27/2016 1143   GLUCOSEU 50 (A) 09/27/2016 1143   HGBUR NEGATIVE 09/27/2016 1143   BILIRUBINUR NEGATIVE 09/27/2016 1143   BILIRUBINUR Negative 12/31/2015 1310   KETONESUR NEGATIVE 09/27/2016 1143   PROTEINUR 100 (A) 09/27/2016 1143   UROBILINOGEN 0.2 12/31/2015 1310   UROBILINOGEN 4.0 (H) 05/29/2007 1609   NITRITE NEGATIVE 09/27/2016 1143   LEUKOCYTESUR NEGATIVE 09/27/2016 1143    Microbiology Recent Results (from the past 240 hour(s))  Culture, blood (Routine x 2)      Status: None   Collection Time: 09/27/16 11:38 AM  Result Value Ref Range Status   Specimen Description BLOOD RIGHT ANTECUBITAL  Final   Special Requests BOTTLES DRAWN AEROBIC AND ANAEROBIC 5CC  Final   Culture NO GROWTH 5 DAYS  Final   Report Status 10/02/2016 FINAL  Final  Wound or Superficial Culture     Status: None   Collection Time: 09/27/16 11:43  AM  Result Value Ref Range Status   Specimen Description WOUND  Final   Special Requests LEFT FOOT  Final   Gram Stain   Final    ABUNDANT WBC PRESENT, PREDOMINANTLY PMN FEW GRAM POSITIVE COCCI IN CLUSTERS FEW GRAM POSITIVE COCCI IN PAIRS    Culture NORMAL SKIN FLORA  Final   Report Status 09/29/2016 FINAL  Final  Culture, blood (Routine x 2)     Status: None   Collection Time: 09/27/16 11:44 AM  Result Value Ref Range Status   Specimen Description BLOOD RIGHT HAND  Final   Special Requests BOTTLES DRAWN AEROBIC AND ANAEROBIC 5CC  Final   Culture NO GROWTH 5 DAYS  Final   Report Status 10/02/2016 FINAL  Final  Surgical pcr screen     Status: None   Collection Time: 09/29/16  1:04 AM  Result Value Ref Range Status   MRSA, PCR NEGATIVE NEGATIVE Final   Staphylococcus aureus NEGATIVE NEGATIVE Final    Comment:        The Xpert SA Assay (FDA approved for NASAL specimens in patients over 27 years of age), is one component of a comprehensive surveillance program.  Test performance has been validated by [facility] for patients greater than or equal to 65 year old. It is not intended to diagnose infection nor to guide or monitor treatment.   Aerobic/Anaerobic Culture (surgical/deep wound)     Status: None   Collection Time: 09/29/16  6:55 PM  Result Value Ref Range Status   Specimen Description ABSCESS  Final   Special Requests LEFT FOOT  Final   Gram Stain   Final    FEW WBC PRESENT, PREDOMINANTLY PMN FEW GRAM POSITIVE COCCI IN CLUSTERS    Culture   Final    FEW STAPHYLOCOCCUS LUGDUNENSIS NO ANAEROBES ISOLATED     Report Status 10/04/2016 FINAL  Final   Organism ID, Bacteria STAPHYLOCOCCUS LUGDUNENSIS  Final      Susceptibility   Staphylococcus lugdunensis - MIC*    CIPROFLOXACIN <=0.5 SENSITIVE Sensitive     ERYTHROMYCIN <=0.25 SENSITIVE Sensitive     GENTAMICIN <=0.5 SENSITIVE Sensitive     OXACILLIN <=0.25 SENSITIVE Sensitive     TETRACYCLINE <=1 SENSITIVE Sensitive     VANCOMYCIN 1 SENSITIVE Sensitive     TRIMETH/SULFA <=10 SENSITIVE Sensitive     CLINDAMYCIN <=0.25 SENSITIVE Sensitive     RIFAMPIN <=0.5 SENSITIVE Sensitive     Inducible Clindamycin NEGATIVE Sensitive     * FEW STAPHYLOCOCCUS LUGDUNENSIS    Time coordinating discharge: Approximately 40 minutes  Vance Gather, MD  [facility] 10/05/2016, 12:16 PM Pager [phone]

## 2016-10-06 ENCOUNTER — Encounter: Payer: Self-pay | Admitting: Internal Medicine

## 2016-10-06 ENCOUNTER — Ambulatory Visit (INDEPENDENT_AMBULATORY_CARE_PROVIDER_SITE_OTHER): Payer: Self-pay | Admitting: Internal Medicine

## 2016-10-06 VITALS — BP 122/72 | HR 80 | Resp 12

## 2016-10-06 DIAGNOSIS — I10 Essential (primary) hypertension: Secondary | ICD-10-CM

## 2016-10-06 DIAGNOSIS — L03116 Cellulitis of left lower limb: Secondary | ICD-10-CM

## 2016-10-06 DIAGNOSIS — E1165 Type 2 diabetes mellitus with hyperglycemia: Secondary | ICD-10-CM

## 2016-10-06 DIAGNOSIS — M8600 Acute hematogenous osteomyelitis, unspecified site: Secondary | ICD-10-CM

## 2016-10-06 DIAGNOSIS — Z794 Long term (current) use of insulin: Secondary | ICD-10-CM

## 2016-10-06 LAB — GLUCOSE, POCT (MANUAL RESULT ENTRY): POC GLUCOSE: 268 mg/dL — AB (ref 70–99)

## 2016-10-06 NOTE — Progress Notes (Signed)
   Subjective:    Patient ID: Willie Jordan, male    DOB: 1966-05-09, 51 y.o.   MRN: [id number]  HPI   Here in follow up from the hospital--discharged last night after amputation of second toe, left foot. .  1.  DM:  States his family lost his medication he had prior to admission.  He had to buy all new medication this morning and does not have enough money until later today to purchase his Novolin 70/30.  He is to be taking 22 units SQ twice daily. He does have Metforming ER 500 mg to take 2 tabs once daily in the morning with breakfast.    2.  Essential Hypertension:  He has and is taking Lisinopril 2.5 mg and HCT 25 mg daily.  3.  Left foot cellulitis/abscess/2nd ray osteomyelitis with partial second ray amputation.  Is to call Dr. Phoebe Perch office today to set up follow up this week.  He is able to change his own dressings. No fever or chills.  States the wound site looks fine.    4.   Hyperlipidemia:  As expected, he has not been able to fill his Atorvastatin.  He does have papers to get filled for one time fill at [facility] outpatient program.  Redirected him there until can get signed up for orange card, which has been his main problem for getting meds, test strips,glucometer, insulin, etc.  Current Meds  Medication Sig  . cephALEXin (KEFLEX) 500 MG capsule Take 1 capsule (500 mg total) by mouth every 8 (eight) hours.  . hydrochlorothiazide (HYDRODIURIL) 25 MG tablet Take 1 tablet (25 mg total) by mouth daily with breakfast.  . lisinopril (PRINIVIL,ZESTRIL) 2.5 MG tablet Take 1 tablet (2.5 mg total) by mouth daily.  . metFORMIN (GLUCOPHAGE-XR) 500 MG 24 hr tablet 2 tabs by mouth with breakfast daily (Patient taking differently: Take 1,000 mg by mouth daily. )    Allergies  Allergen Reactions  . Aspirin Rash        Review of Systems     Objective:   Physical Exam NAD Left foot:  No edema:  Opening where 2nd digit was is clean and dry with good color to tissues.  Wound closed  only at edges with staples.   Has staples along medial foot edge from midfoot to just anterior to heel.  This is also clean and dry with good tissue coloring.   Sterile packing placed in opening where 2nd digit was.  Foot redressed and flat shoe placed over dressing.  Patient tolerated without difficulty       Assessment & Plan:  1.  Left Foot cellulitis/abscess/osteomyelitis of second ray, now status post removal of 2nd ray.  Finishing up 3 more days of Cephalexin. Continue with twice daily dressing changes.  Patient given some supplies. To keep foot elevated. To call Podiatry for follow up this week.  If he is unable to get in, to call the office.    2.  DM:  Feels he will be able to pick up the Novolin 70/30 later today. Will have our office manager call to see about getting him signed up with orange card for more affordable insulin.   Continue Metformin ER 1000 mg in the morning.  3.  Hyperlipidemia:  Again, unable to afford the Atorvastatin.  Needs orange card.    4.  Essential Hypertension:  Controlled with low dose Lisinopril/HCTZ

## 2016-10-08 ENCOUNTER — Other Ambulatory Visit: Payer: Self-pay

## 2016-10-08 MED ORDER — CEPHALEXIN 500 MG PO CAPS: 500.0000 mg | ORAL_CAPSULE | Freq: Three times a day (TID) | ORAL | 0 refills | Status: DC

## 2016-10-08 MED ORDER — ATORVASTATIN CALCIUM 40 MG PO TABS: 40.0000 mg | ORAL_TABLET | Freq: Every day | ORAL | 11 refills | Status: DC

## 2016-10-08 MED ORDER — GABAPENTIN 100 MG PO CAPS: ORAL_CAPSULE | ORAL | 11 refills | Status: DC

## 2016-10-13 ENCOUNTER — Ambulatory Visit (INDEPENDENT_AMBULATORY_CARE_PROVIDER_SITE_OTHER): Payer: Self-pay | Admitting: Podiatry

## 2016-10-13 ENCOUNTER — Telehealth: Payer: Self-pay | Admitting: Internal Medicine

## 2016-10-13 ENCOUNTER — Encounter: Payer: Self-pay | Admitting: Podiatry

## 2016-10-13 ENCOUNTER — Ambulatory Visit (INDEPENDENT_AMBULATORY_CARE_PROVIDER_SITE_OTHER): Payer: Self-pay

## 2016-10-13 DIAGNOSIS — L03119 Cellulitis of unspecified part of limb: Secondary | ICD-10-CM

## 2016-10-13 DIAGNOSIS — L02619 Cutaneous abscess of unspecified foot: Secondary | ICD-10-CM

## 2016-10-13 DIAGNOSIS — M869 Osteomyelitis, unspecified: Secondary | ICD-10-CM

## 2016-10-13 MED ORDER — SULFAMETHOXAZOLE-TRIMETHOPRIM 800-160 MG PO TABS: 1.0000 | ORAL_TABLET | Freq: Two times a day (BID) | ORAL | 0 refills | Status: DC

## 2016-10-13 MED ORDER — HYDROCODONE-ACETAMINOPHEN 5-325 MG PO TABS
1.0000 | ORAL_TABLET | Freq: Four times a day (QID) | ORAL | 0 refills | Status: DC | PRN
Start: 2016-10-13 — End: 2016-10-28

## 2016-10-13 NOTE — Telephone Encounter (Signed)
Spoke with patient and he is currently at Dr. Amalia Hailey office for a follow up from his amputation. Patient will find out from Dr. Amalia Hailey if he wants him to continue the Keflex.

## 2016-10-13 NOTE — Telephone Encounter (Signed)
Patient needs refill of cephALEXin (KEFLEX) 500 mg capsule.

## 2016-10-14 MED FILL — HYDROCODON-APAP 5-325: 5-325 | 4 days supply | Qty: 30 | Fill #0

## 2016-10-14 NOTE — Progress Notes (Signed)
Subjective: Patient presents today status post partial second ray amputation and incision and drainage of the left foot. DOS: 09/29/2016. Patient states that he is doing well. Patient denies pain. Patient presents today with a interpreter  Objective: Foot appears to be stable. Open wound noted to the amputation stump of the second digit with packing intact. Incision site to the medial rear foot appears to be stable with sutures and staples intact. No sign of infectious process noted today. No purulent drainage noted.  Assessment: Status post partial second ray amputation with incision and drainage left foot. Date of surgery 09/29/2016.  Plan of care: Today the patient was evaluated. Dressings were changed and packing reapplied to the amputation stump second digit left foot. Patient has home health nurse dressing changes 2 times per week. Prescription for Bactrim DS. Prescription for Vicodin. Patient will require likely delayed primary closure of the amputation stump second digit. We will have to arrange to have it performed either in the office or hospital due to no insurance coverage. Return to clinic in 2 weeks  Edrick Kins, DPM [facility]  Dr. Edrick Kins, [address]                                        [place], [place] [address]                Office [phone]  Fax [phone]

## 2016-10-20 ENCOUNTER — Ambulatory Visit: Payer: Self-pay | Admitting: Internal Medicine

## 2016-10-27 ENCOUNTER — Ambulatory Visit (INDEPENDENT_AMBULATORY_CARE_PROVIDER_SITE_OTHER): Payer: Self-pay | Admitting: Podiatry

## 2016-10-27 DIAGNOSIS — T8189XA Other complications of procedures, not elsewhere classified, initial encounter: Secondary | ICD-10-CM

## 2016-10-27 DIAGNOSIS — L97522 Non-pressure chronic ulcer of other part of left foot with fat layer exposed: Secondary | ICD-10-CM

## 2016-10-27 DIAGNOSIS — T8789 Other complications of amputation stump: Secondary | ICD-10-CM

## 2016-10-27 DIAGNOSIS — I70245 Atherosclerosis of native arteries of left leg with ulceration of other part of foot: Secondary | ICD-10-CM

## 2016-10-27 MED ORDER — SULFAMETHOXAZOLE-TRIMETHOPRIM 800-160 MG PO TABS: 1.0000 | ORAL_TABLET | Freq: Two times a day (BID) | ORAL | 0 refills | Status: DC

## 2016-10-27 NOTE — Progress Notes (Signed)
   HPI:   Patient with a history of diabetes mellitus and status post partial second ray amputation with incision and drainage of the left foot presents today for follow-up evaluation. Date of surgery 09/29/2016. Patient states he is doing well and denies pain.  Objective:  Wound #1 noted to the left foot second ray amputation site measures approximately [phone] cm Cellulitis associated to the left foot appears to be resolved. Open wound noted to the amputation stump of the second digit with packing intact. The open wound appears to be healthy and viable with minimal drainage. No malodor noted. No sign of infectious process noted. Wound base appears to be healthy and granular.  Assessment: #1 status post partial second ray amputation. Date of surgery 09/29/2016 #2 open wound second ray left foot   Plan of Care:  #1 Patient was evaluated. #2 today a delayed primary closure was obtained of the ulceration site. Medically necessary excisional debridement including subcutaneous tissue was performed using a curette and tissue nipper. Excisional debridement of all the necrotic nonviable tissue down to healthy bleeding viable tissue was performed with post-debridement measurements and was pre-. Primary closure was obtained today using 3-0 nylon suture. #3 dry sterile dressings were applied. Instructed the patient to keep the dressings clean dry and intact until follow-up next week. #4 home health nurse dressing changes have been completed. Patient states he is no longer receiving home health nurse dressing changes as of last week. #5 refill prescription for Bactrim DS #6 return to clinic in 1 week   Edrick Kins, DPM [facility]  Dr. Edrick Kins, [address]                                        [place], [place] [address]                Office [phone]  Fax [phone]

## 2016-10-28 ENCOUNTER — Encounter: Payer: Self-pay | Admitting: Internal Medicine

## 2016-10-28 ENCOUNTER — Ambulatory Visit (INDEPENDENT_AMBULATORY_CARE_PROVIDER_SITE_OTHER): Payer: Self-pay | Admitting: Internal Medicine

## 2016-10-28 VITALS — BP 152/82 | HR 86 | Resp 12 | Ht 66.25 in | Wt 172.0 lb

## 2016-10-28 DIAGNOSIS — I10 Essential (primary) hypertension: Secondary | ICD-10-CM

## 2016-10-28 DIAGNOSIS — E1165 Type 2 diabetes mellitus with hyperglycemia: Secondary | ICD-10-CM

## 2016-10-28 DIAGNOSIS — Z794 Long term (current) use of insulin: Secondary | ICD-10-CM

## 2016-10-28 DIAGNOSIS — E785 Hyperlipidemia, unspecified: Secondary | ICD-10-CM

## 2016-10-28 LAB — GLUCOSE, POCT (MANUAL RESULT ENTRY): POC Glucose: 130 mg/dl — AB (ref 70–99)

## 2016-10-28 MED ORDER — PITAVASTATIN CALCIUM 2 MG PO TABS: ORAL_TABLET | ORAL | 11 refills | Status: DC

## 2016-10-28 MED ORDER — INSULIN ASPART PROT & ASPART (70-30 MIX) 100 UNIT/ML PEN: PEN_INJECTOR | SUBCUTANEOUS | 11 refills | Status: DC

## 2016-10-28 MED FILL — SULFAMETHOXAZOLE/TMP DS TAB: 800-160 | 14 days supply | Qty: 28 | Fill #0

## 2016-10-28 NOTE — Patient Instructions (Signed)
Tome un vaso de agua antes de cada comida Tome un minimo de 6 a 8 vasos de agua diarios Coma tres veces al dia Coma una proteina y una grasa saludable con comida.  (huevos, pescado, pollo, pavo, y limite carnes rojas Coma 5 porciones diarias de legumbres.  Mezcle los colores Coma 2 porciones diarias de frutas con cascara cuando sea comestible Use platos pequeos Suelte su tenedor o cuchara despues de cada mordida hata que se mastique y se trague Come en la mesa con amigos o familiares por lo menos una vez al dia Apague la televisin y aparatos electrnicos durante la comida  Su objetivo debe ser perder una libra por semana 

## 2016-10-28 NOTE — Progress Notes (Signed)
   Subjective:    Patient ID: Willie Jordan, male    DOB: 1965/08/31, 51 y.o.   MRN: [id number]  HPI   1.  DM:  Sugars since 10/13/16 generally running in mid to high 100s, which is much better for him.  He continues on Novolin 70/30 22 units twice daily before a meal. He now has an orange card and can get newer versions of insulin through MAP as well.   Feels he is eating better.   Describes a better diet as well.  2.  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 Sees podiatry next week.   3.  Hyperlipidemia:  Switching to Pitavastatin 2 mg daily as can get through [facility].  4.  Essential Hypertension:  States he took his Lisinopril and HCTZ this morning.  Has not missed in past week.    Current Meds  Medication Sig  . blood glucose meter kit and supplies Dispense based on patient and insurance preference. Use up to four times daily as directed. (FOR ICD-9 250.00, 250.01).  . Cyanocobalamin (VITAMIN B-12 PO) Take 1 tablet by mouth daily.  Marland Kitchen gabapentin (NEURONTIN) 100 MG capsule 3 caps by mouth at bedtime (Patient taking differently: 100 mg. 1 cap by mouth at bedtime)  . hydrochlorothiazide (HYDRODIURIL) 25 MG tablet Take 1 tablet (25 mg total) by mouth daily with breakfast.  . lisinopril (PRINIVIL,ZESTRIL) 2.5 MG tablet Take 1 tablet (2.5 mg total) by mouth daily.  . metFORMIN (GLUCOPHAGE-XR) 500 MG 24 hr tablet 2 tabs by mouth with breakfast daily (Patient taking differently: Take 1,000 mg by mouth daily. )  . sulfamethoxazole-trimethoprim (BACTRIM DS,SEPTRA DS) 800-160 MG tablet Take 1 tablet by mouth 2 (two) times daily.  . [DISCONTINUED] atorvastatin (LIPITOR) 40 MG tablet Take 1 tablet (40 mg total) by mouth daily at 6 PM.  . [DISCONTINUED] insulin NPH-regular Human (NOVOLIN 70/30) (70-30) 100 UNIT/ML injection Inject 22 Units into the skin 2 (two) times daily with a meal.   Allergies  Allergen Reactions    . Aspirin Rash    Review of Systems     Objective:   Physical Exam  NAD Lungs:  CTA CV:  RRR without murmur or rub, radial pulses normal and equal LE:  No edema.  Left foot dressing clean and dry.        Assessment & Plan:  1.  DM:  Switch to Novolog  70/30 flexpen 22 units twice daily  15 minutes before meals.  To bring in glucose journal with each visit.  Discussed a good diet and ultimately getting into regular physical activity once foot is adequately healed.  2.  Essential Hypertension:  No change with meds for now.  Suspect he may be missing meds a times.  3.  Hyperlipidemia:  Switched to Pitavastatin as can get through MAP for free instead of Atorvastatin.  Will recheck cholesterol in 6-[redacted] weeks along with hepatic profile.  4.  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  Urged patient to work hard at getting DM under control to prevent more rapid decline in health and recurrence of similar problem

## 2016-11-03 ENCOUNTER — Ambulatory Visit (INDEPENDENT_AMBULATORY_CARE_PROVIDER_SITE_OTHER): Payer: Self-pay | Admitting: Podiatry

## 2016-11-03 ENCOUNTER — Encounter: Payer: Self-pay | Admitting: Podiatry

## 2016-11-03 DIAGNOSIS — T8789 Other complications of amputation stump: Secondary | ICD-10-CM

## 2016-11-03 DIAGNOSIS — T8189XA Other complications of procedures, not elsewhere classified, initial encounter: Secondary | ICD-10-CM

## 2016-11-03 DIAGNOSIS — I70245 Atherosclerosis of native arteries of left leg with ulceration of other part of foot: Secondary | ICD-10-CM

## 2016-11-03 NOTE — Progress Notes (Signed)
   HPI:   Patient with a history of diabetes mellitus and status post partial second ray amputation with incision and drainage of the left foot presents today for follow-up evaluation. Date of surgery 09/29/2016. Patient states he is doing well and denies pain.  Objective:  Wound #1 noted to the left foot second ray amputation site measures approximately [phone] cm Cellulitis associated to the left foot appears to be resolved. Open wound noted to the amputation stump of the second digit with packing intact. The open wound appears to be healthy and viable with minimal drainage. No malodor noted. No sign of infectious process noted. Wound base appears to be healthy and granular.  Assessment: #1 status post partial second ray amputation. Date of surgery 09/29/2016 #2 open wound second ray left foot   Plan of Care:  #1 Patient was evaluated. #2 finishing oral antibiotics Bactrim DS #3 dry sterile dressings applied #4 dressing supplies provided for the patient for home dressing changes #5 return to clinic in 2 weeks  Edrick Kins, DPM [facility]  Dr. Edrick Kins, [address]                                        [place], [place] [address]                Office [phone]  Fax [phone]

## 2016-11-19 ENCOUNTER — Ambulatory Visit (INDEPENDENT_AMBULATORY_CARE_PROVIDER_SITE_OTHER): Payer: Self-pay | Admitting: Podiatry

## 2016-11-19 ENCOUNTER — Encounter: Payer: Self-pay | Admitting: Podiatry

## 2016-11-19 DIAGNOSIS — L97522 Non-pressure chronic ulcer of other part of left foot with fat layer exposed: Secondary | ICD-10-CM

## 2016-11-19 DIAGNOSIS — T8789 Other complications of amputation stump: Secondary | ICD-10-CM

## 2016-11-19 DIAGNOSIS — T8189XA Other complications of procedures, not elsewhere classified, initial encounter: Secondary | ICD-10-CM

## 2016-11-19 DIAGNOSIS — I70245 Atherosclerosis of native arteries of left leg with ulceration of other part of foot: Secondary | ICD-10-CM

## 2016-11-20 NOTE — Progress Notes (Signed)
   HPI:   Patient with a history of diabetes mellitus and status post partial second ray amputation with incision and drainage of the left foot presents today for follow-up evaluation. Date of surgery 09/29/2016. Patient states he is doing well and denies pain. He reports he completed the antibiotic last Monday.  Objective:  Sutures intact to amputation site. Moderate drainage noted. No signs of infection.  Assessment: #1 status post partial second ray amputation. Date of surgery 09/29/2016 #2 open wound second ray left foot   Plan of Care:  #1 Patient was evaluated. #2 dressings changed. #3 continue daily iodine. #4 return to clinic in 1 week.  Edrick Kins, DPM [facility]  Dr. Edrick Kins, [address]                                        [place], [place] [address]                Office [phone]  Fax [phone]

## 2016-11-25 MED FILL — HYDROCODON-APAP 5-325: 5-325 | 2 days supply | Qty: 12 | Fill #0

## 2016-11-26 ENCOUNTER — Ambulatory Visit (INDEPENDENT_AMBULATORY_CARE_PROVIDER_SITE_OTHER): Payer: Self-pay | Admitting: Podiatry

## 2016-11-26 ENCOUNTER — Encounter: Payer: Self-pay | Admitting: Podiatry

## 2016-11-26 VITALS — Temp 97.7°F

## 2016-11-26 DIAGNOSIS — T8189XA Other complications of procedures, not elsewhere classified, initial encounter: Secondary | ICD-10-CM

## 2016-11-26 DIAGNOSIS — L97522 Non-pressure chronic ulcer of other part of left foot with fat layer exposed: Secondary | ICD-10-CM

## 2016-11-26 DIAGNOSIS — T8789 Other complications of amputation stump: Secondary | ICD-10-CM

## 2016-11-26 DIAGNOSIS — I70245 Atherosclerosis of native arteries of left leg with ulceration of other part of foot: Secondary | ICD-10-CM

## 2016-11-26 MED ORDER — HYDROCODONE-ACETAMINOPHEN 7.5-325 MG PO TABS: 1.0000 | ORAL_TABLET | Freq: Four times a day (QID) | ORAL | 0 refills | Status: DC | PRN

## 2016-11-26 MED ORDER — GABAPENTIN 100 MG PO CAPS: 100.0000 mg | ORAL_CAPSULE | Freq: Three times a day (TID) | ORAL | 3 refills | Status: DC

## 2016-11-28 ENCOUNTER — Ambulatory Visit (INDEPENDENT_AMBULATORY_CARE_PROVIDER_SITE_OTHER): Payer: Self-pay | Admitting: Internal Medicine

## 2016-11-28 ENCOUNTER — Encounter: Payer: Self-pay | Admitting: Internal Medicine

## 2016-11-28 VITALS — BP 160/90 | HR 72 | Resp 12 | Ht 66.25 in | Wt 176.0 lb

## 2016-11-28 DIAGNOSIS — R609 Edema, unspecified: Secondary | ICD-10-CM

## 2016-11-28 DIAGNOSIS — L089 Local infection of the skin and subcutaneous tissue, unspecified: Secondary | ICD-10-CM

## 2016-11-28 DIAGNOSIS — Z794 Long term (current) use of insulin: Secondary | ICD-10-CM

## 2016-11-28 DIAGNOSIS — E11628 Type 2 diabetes mellitus with other skin complications: Secondary | ICD-10-CM

## 2016-11-28 DIAGNOSIS — N63 Unspecified lump in unspecified breast: Secondary | ICD-10-CM

## 2016-11-28 DIAGNOSIS — E1165 Type 2 diabetes mellitus with hyperglycemia: Secondary | ICD-10-CM

## 2016-11-28 DIAGNOSIS — I10 Essential (primary) hypertension: Secondary | ICD-10-CM

## 2016-11-28 MED ORDER — LISINOPRIL 5 MG PO TABS: ORAL_TABLET | ORAL | 3 refills | Status: DC

## 2016-11-28 MED ORDER — METFORMIN HCL ER 500 MG PO TB24: ORAL_TABLET | ORAL | 3 refills | Status: DC

## 2016-11-28 MED ORDER — INSULIN LISPRO PROT & LISPRO (75-25 MIX) 100 UNIT/ML KWIKPEN: PEN_INJECTOR | SUBCUTANEOUS | 11 refills | Status: DC

## 2016-11-28 MED ORDER — PITAVASTATIN CALCIUM 2 MG PO TABS: ORAL_TABLET | ORAL | 11 refills | Status: DC

## 2016-11-28 MED ORDER — GABAPENTIN 300 MG PO CAPS: 300.0000 mg | ORAL_CAPSULE | Freq: Three times a day (TID) | ORAL | 11 refills | Status: DC

## 2016-11-28 NOTE — Progress Notes (Signed)
Subjective:    Patient ID: Willie Jordan, male    DOB: January 16, 1966, 51 y.o.   MRN: [id number]  HPI   1.  Left Second Ray amputation  09/27/2016:  Everything was good yesterday with podiatry, to follow up in 3 weeks.  Sutures that were place secondarily were removed, but opening still there.  They have decided to allow to heal by secondary intention, which apparently it is.  States half an inch left to fill in.  2.  Swelling in his legs:  Poor historian.  States has had in the past 3 weeks.  Swelling generally more pronounced in the afternoon after sitting up at computer.   He does eat canned veggies and pickled chiles. No dyspnea or chest pain associated with swelling.  3.  DM:  Sugars running 160 before breakfast.  180 to 200 before dinner in the evening. Did not bring in sugars again Was switched to Humalog 75/25 insulin 1 week ago.  He states he was told he would not be able to fill again, but discussed that was the original Novolog Rx.  Using 22 units twice daily.  4.  Peripheral Neuropathy:  Patient states his podiatrist was suggesting he could increase his Gabapentin dosage above the 300 mg at bedtime.  He has already taken up to 1 capsule three times daily.   Tolerating the dosing fine. Have had difficulties with patient following directions with meds and so discussed a slow titration with gabapentin in past 2 months.  5.  Essential Hypertension:  States taking HCTZ 25 mg daily and Lisinopril 2.5 mg daily.    6.  Hyperlipidemia:  [facility] can get him Pitavastatin for free rather than $6 monthly for Atorvastatin.   7.  Anemia with foot infection:  Time for recheck.  Not clear if taking iron supplementation/ Vitamin B12 regularly.   Current Meds  Medication Sig  . atorvastatin (LIPITOR) 80 MG tablet Take 80 mg by mouth daily. 1/2 tablet daily  . blood glucose meter kit and supplies Dispense based on patient and insurance preference. Use up to four times daily as directed. (FOR ICD-9  250.00, 250.01).  . Cyanocobalamin (VITAMIN B-12 PO) Take 1 tablet by mouth daily.  Marland Kitchen gabapentin (NEURONTIN) 100 MG capsule Take 1 capsule (100 mg total) by mouth 3 (three) times daily.  . hydrochlorothiazide (HYDRODIURIL) 25 MG tablet Take 1 tablet (25 mg total) by mouth daily with breakfast.  . HYDROcodone-acetaminophen (NORCO) 7.5-325 MG tablet Take 1 tablet by mouth every 6 (six) hours as needed for moderate pain.  . Insulin Lispro Prot & Lispro (HUMALOG MIX 75/25 KWIKPEN) (75-25) 100 UNIT/ML Kwikpen Inject 26 units subcutaneously in the morning and 22 units in the evening before meals  . lisinopril (PRINIVIL,ZESTRIL) 2.5 MG tablet Take 1 tablet (2.5 mg total) by mouth daily.  . metFORMIN (GLUCOPHAGE-XR) 500 MG 24 hr tablet 2 tabs by mouth with breakfast daily (Patient taking differently: Take 1,000 mg by mouth daily. )  . Multiple Vitamin (MULTIVITAMIN) tablet Take 1 tablet by mouth daily.  . [DISCONTINUED] Insulin Lispro Prot & Lispro (HUMALOG MIX 75/25 KWIKPEN) (75-25) 100 UNIT/ML Kwikpen Inject 22 Units into the skin 2 (two) times daily.       Review of Systems     Objective:   Physical Exam   NAD Chest:  CTA,bases resonant to percussion. Left nipple with small amount of soft tissue beneath areola.  Nontender.  No erythema.  Not palpable similarly beneath right areola. CV:  RRR with  normal S1 and S2, No S3, S4 or murmur, carotid and radial pulses normal and equal. No JVD.  Mild swelling of ankles.  Left foot dressing clean and dry. Abd:  S, NT, No HSM or mass, + BS        Assessment & Plan:  1.  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  Swelling of ankles:  No evidence of failure.  To avoid the salty foods he has been eating and avoid dependence of feet and legs. Continue HCTZ. Check CMP.  3.  DM:  Increase morning Humalog mix to 26 units and keep evening dose at 22 units.  Continue morning Metformin ER at 1000 mg. A1C and urine  microalbumin/crea in 6 weeks.  Appears to be doing better with care, but does not bring in any documentation as have been asking.  4.  Peripheral Neuropathy:  Switch to 300 mg cap and take 3 times daily as he has been doing.  5.  Hyperlipidemia:  Switching to Pitavastatin 2 mg with evening meal.  Repeat FLP, CMP in 6 weeks.  6.  Essential Hypertension:  Not controlled.  Increase Lisinopril to 5 mg daily and continue HCTZ.  Repeat BP and pulse check with BMP in 2 weeks.  7.  Anemia:  CBC  8.  Brings up left breast bump at end of visit.  Just noted in recent days.  Nontender

## 2016-11-28 NOTE — Progress Notes (Signed)
   HPI:  Patient with a history of diabetes mellitus and status post partial second ray amputation with incision and drainage of the left foot presents today for follow-up evaluation. Date of surgery 09/29/2016. He states he is having burning pain to the area. He has been taking Vicodin which provides significant relief of the pain and is requesting a refill. Applying pressure to the area exacerbates the pain.  Objective:  Sutures intact to amputation site. Moderate drainage noted. No signs of infection. Ulceration of the left second toe incision site measuring 3.0 x 0.5 x 1.0 cm  Assessment: #1 status post partial second ray amputation. Date of surgery 09/29/2016 #2 ulcer second toe amputation site-left   Plan of Care:  #1 Patient was evaluated. #2 medically necessary excisional debridement including muscle and deep fascial tissue was performed using a tissue nipper and a chisel blade. Excisional debridement of all the necrotic nonviable tissue down to healthy bleeding viable tissue was performed with post-debridement measurements same as pre-. #3 The wound was cleansed. Prisma and dry sterile dressing applied. #4 Prisma dispensed to patient. #5 prescription for Vicodin given to patient. #6 prescription for gabapentin 100 mg 3 times a day given to patient. #7 return to clinic in 3 weeks.   Edrick Kins, DPM [facility]  Dr. Edrick Kins, [address]                                        [place], [place] [address]                Office [phone]  Fax [phone]

## 2016-11-29 LAB — COMPREHENSIVE METABOLIC PANEL
ALBUMIN: 3.8 g/dL (ref 3.5–5.5)
ALT: 23 IU/L (ref 0–44)
AST: 22 IU/L (ref 0–40)
Albumin/Globulin Ratio: 1.3 (ref 1.2–2.2)
Alkaline Phosphatase: 93 IU/L (ref 39–117)
BUN / CREAT RATIO: 13 (ref 9–20)
BUN: 10 mg/dL (ref 6–24)
Bilirubin Total: 0.3 mg/dL (ref 0.0–1.2)
CALCIUM: 9 mg/dL (ref 8.7–10.2)
CO2: 28 mmol/L (ref 18–29)
CREATININE: 0.79 mg/dL (ref 0.76–1.27)
Chloride: 102 mmol/L (ref 96–106)
GFR, EST AFRICAN AMERICAN: 123 mL/min/{1.73_m2} (ref >59)
GFR, EST NON AFRICAN AMERICAN: 106 mL/min/{1.73_m2} (ref >59)
GLOBULIN, TOTAL: 3 g/dL (ref 1.5–4.5)
GLUCOSE: 198 mg/dL — AB (ref 65–99)
Potassium: 4.4 mmol/L (ref 3.5–5.2)
SODIUM: 144 mmol/L (ref 134–144)
Total Protein: 6.8 g/dL (ref 6.0–8.5)

## 2016-11-29 LAB — CBC WITH DIFFERENTIAL/PLATELET
Basophils Absolute: 0 10*3/uL (ref 0.0–0.2)
Basos: 1 %
EOS (ABSOLUTE): 0.1 10*3/uL (ref 0.0–0.4)
EOS: 1 %
HEMATOCRIT: 41.2 % (ref 37.5–51.0)
HEMOGLOBIN: 13.6 g/dL (ref 13.0–17.7)
IMMATURE GRANULOCYTES: 0 %
Immature Grans (Abs): 0 10*3/uL (ref 0.0–0.1)
LYMPHS ABS: 1.7 10*3/uL (ref 0.7–3.1)
Lymphs: 25 %
MCH: 27.7 pg (ref 26.6–33.0)
MCHC: 33 g/dL (ref 31.5–35.7)
MCV: 84 fL (ref 79–97)
MONOS ABS: 0.6 10*3/uL (ref 0.1–0.9)
Monocytes: 9 %
Neutrophils Absolute: 4.2 10*3/uL (ref 1.4–7.0)
Neutrophils: 64 %
Platelets: 206 10*3/uL (ref 150–379)
RBC: 4.91 x10E6/uL (ref 4.14–5.80)
RDW: 14.8 % (ref 12.3–15.4)
WBC: 6.5 10*3/uL (ref 3.4–10.8)

## 2016-12-10 MED FILL — HYDROCODON-APAP 7.5-325: 7.5-325 | 8 days supply | Qty: 30 | Fill #0

## 2016-12-12 ENCOUNTER — Other Ambulatory Visit (INDEPENDENT_AMBULATORY_CARE_PROVIDER_SITE_OTHER): Payer: Self-pay

## 2016-12-12 VITALS — BP 162/90 | HR 78

## 2016-12-12 DIAGNOSIS — Z79899 Other long term (current) drug therapy: Secondary | ICD-10-CM

## 2016-12-12 NOTE — Progress Notes (Unsigned)
Per Dr. Amil Amen increase Lisinopril to 10 mg daily and repeat BMP and BP in 2 weeks. Left message for patient to call the office on Monday.

## 2016-12-13 LAB — BASIC METABOLIC PANEL
BUN/Creatinine Ratio: 17 (ref 9–20)
BUN: 15 mg/dL (ref 6–24)
CALCIUM: 9.3 mg/dL (ref 8.7–10.2)
CO2: 25 mmol/L (ref 18–29)
Chloride: 102 mmol/L (ref 96–106)
Creatinine, Ser: 0.88 mg/dL (ref 0.76–1.27)
GFR, EST AFRICAN AMERICAN: 117 mL/min/{1.73_m2} (ref >59)
GFR, EST NON AFRICAN AMERICAN: 102 mL/min/{1.73_m2} (ref >59)
Glucose: 244 mg/dL — ABNORMAL HIGH (ref 65–99)
POTASSIUM: 4.1 mmol/L (ref 3.5–5.2)
SODIUM: 142 mmol/L (ref 134–144)

## 2016-12-24 ENCOUNTER — Ambulatory Visit (INDEPENDENT_AMBULATORY_CARE_PROVIDER_SITE_OTHER): Payer: Self-pay | Admitting: Podiatry

## 2016-12-24 ENCOUNTER — Encounter: Payer: Self-pay | Admitting: Podiatry

## 2016-12-24 DIAGNOSIS — T8189XA Other complications of procedures, not elsewhere classified, initial encounter: Secondary | ICD-10-CM

## 2016-12-24 DIAGNOSIS — I70245 Atherosclerosis of native arteries of left leg with ulceration of other part of foot: Secondary | ICD-10-CM

## 2016-12-24 DIAGNOSIS — L97522 Non-pressure chronic ulcer of other part of left foot with fat layer exposed: Secondary | ICD-10-CM

## 2016-12-24 DIAGNOSIS — T8789 Other complications of amputation stump: Secondary | ICD-10-CM

## 2016-12-30 NOTE — Progress Notes (Signed)
   HPI:  Patient with a history of diabetes mellitus and status post partial second ray amputation with incision and drainage of the left foot presents today for follow-up evaluation. Date of surgery 09/29/2016. He states his condition is improving.  Objective:  Sutures intact to amputation site. Moderate drainage noted. No signs of infection. Ulceration to the left second toe amputation site has mostly healed. The wound measures approximately 0.50.1 (LxW)cm. Very superficial in nature. Patient overall is doing very well.  Assessment: #1 status post partial second ray amputation. Date of surgery 09/29/2016 #2 ulcer second toe amputation site-left   Plan of Care:  #1 Patient was evaluated. #2 begin wearing good shoes/sneakers. #3 Antibiotic ointment and Band-Aid daily. #4 return to clinic when necessary.  Edrick Kins, DPM [facility]  Dr. Edrick Kins, [address]                                        [place], [place] [address]                Office [phone]  Fax [phone]

## 2017-01-08 ENCOUNTER — Other Ambulatory Visit: Payer: Self-pay

## 2017-01-09 ENCOUNTER — Other Ambulatory Visit: Payer: Self-pay | Admitting: Internal Medicine

## 2017-01-09 ENCOUNTER — Telehealth: Payer: Self-pay | Admitting: Internal Medicine

## 2017-01-09 ENCOUNTER — Other Ambulatory Visit (INDEPENDENT_AMBULATORY_CARE_PROVIDER_SITE_OTHER): Payer: Self-pay | Admitting: Internal Medicine

## 2017-01-09 DIAGNOSIS — Z794 Long term (current) use of insulin: Secondary | ICD-10-CM

## 2017-01-09 DIAGNOSIS — E1165 Type 2 diabetes mellitus with hyperglycemia: Secondary | ICD-10-CM

## 2017-01-09 DIAGNOSIS — N6320 Unspecified lump in the left breast, unspecified quadrant: Secondary | ICD-10-CM

## 2017-01-09 DIAGNOSIS — E785 Hyperlipidemia, unspecified: Secondary | ICD-10-CM

## 2017-01-09 DIAGNOSIS — Z79899 Other long term (current) drug therapy: Secondary | ICD-10-CM

## 2017-01-09 NOTE — Progress Notes (Signed)
Left breast mass--subareola.  Not covered with [facility]--see telephone note

## 2017-01-10 LAB — COMPREHENSIVE METABOLIC PANEL
A/G RATIO: 1.2 (ref 1.2–2.2)
ALK PHOS: 114 IU/L (ref 39–117)
ALT: 20 IU/L (ref 0–44)
AST: 20 IU/L (ref 0–40)
Albumin: 3.4 g/dL — ABNORMAL LOW (ref 3.5–5.5)
BUN/Creatinine Ratio: 20 (ref 9–20)
BUN: 20 mg/dL (ref 6–24)
CHLORIDE: 102 mmol/L (ref 96–106)
CO2: 25 mmol/L (ref 20–29)
Calcium: 9.1 mg/dL (ref 8.7–10.2)
Creatinine, Ser: 0.98 mg/dL (ref 0.76–1.27)
GFR calc Af Amer: 105 mL/min/{1.73_m2} (ref >59)
GFR calc non Af Amer: 91 mL/min/{1.73_m2} (ref >59)
GLUCOSE: 160 mg/dL — AB (ref 65–99)
Globulin, Total: 2.9 g/dL (ref 1.5–4.5)
POTASSIUM: 4.1 mmol/L (ref 3.5–5.2)
Sodium: 140 mmol/L (ref 134–144)
Total Protein: 6.3 g/dL (ref 6.0–8.5)

## 2017-01-10 LAB — LIPID PANEL W/O CHOL/HDL RATIO
Cholesterol, Total: 281 mg/dL — ABNORMAL HIGH (ref 100–199)
HDL: 29 mg/dL — AB (ref >39)
TRIGLYCERIDES: 498 mg/dL — AB (ref 0–149)

## 2017-01-10 LAB — MICROALBUMIN / CREATININE URINE RATIO
CREATININE, UR: 109 mg/dL
MICROALB/CREAT RATIO: 1620.1 mg/g{creat} — AB (ref 0.0–30.0)
Microalbumin, Urine: 1765.9 ug/mL

## 2017-01-10 LAB — HGB A1C W/O EAG: HEMOGLOBIN A1C: 7.1 % — AB (ref 4.8–5.6)

## 2017-01-12 ENCOUNTER — Other Ambulatory Visit: Payer: Self-pay

## 2017-01-12 MED ORDER — PITAVASTATIN CALCIUM 2 MG PO TABS: ORAL_TABLET | ORAL | 11 refills | Status: DC

## 2017-01-12 MED ORDER — INSULIN PEN NEEDLE 31G X 5 MM MISC: 11 refills | Status: AC

## 2017-01-12 MED ORDER — LISINOPRIL 10 MG PO TABS: ORAL_TABLET | ORAL | 3 refills | Status: DC

## 2017-01-12 MED FILL — UNIFINE PENTIPS 31GX3/16": 31G X 5 MM | 50 days supply | Qty: 100 | Fill #0

## 2017-01-12 MED FILL — UNIFINE PENTIPS 31GX3/16: 31G X 5 MM | 50 days supply | Qty: 100 | Fill #0

## 2017-01-12 NOTE — Addendum Note (Signed)
Addended by: Marcelino Duster on: 01/12/2017 10:25 AM   Modules accepted: Orders

## 2017-01-12 NOTE — Progress Notes (Signed)
Changing meds with lab results

## 2017-01-12 NOTE — Telephone Encounter (Signed)
Patient coming in office this morning to fill out financial assistance paperwork

## 2017-01-16 ENCOUNTER — Ambulatory Visit: Payer: Self-pay | Admitting: Internal Medicine

## 2017-01-28 ENCOUNTER — Ambulatory Visit (INDEPENDENT_AMBULATORY_CARE_PROVIDER_SITE_OTHER): Payer: Self-pay | Admitting: Podiatry

## 2017-01-28 DIAGNOSIS — E0840 Diabetes mellitus due to underlying condition with diabetic neuropathy, unspecified: Secondary | ICD-10-CM

## 2017-01-28 DIAGNOSIS — I70245 Atherosclerosis of native arteries of left leg with ulceration of other part of foot: Secondary | ICD-10-CM

## 2017-01-28 MED ORDER — PREGABALIN 100 MG PO CAPS: 100.0000 mg | ORAL_CAPSULE | Freq: Three times a day (TID) | ORAL | 2 refills | Status: DC

## 2017-01-28 NOTE — Progress Notes (Signed)
   HPI: 51 year old male presents to the office today for follow-up evaluation status post partial second ray amputation of the left foot. Patient states that he feels well. He believes that the ulcer to the second toe amputation site has healed. Patient does complain today of some numbness and burning tingling sensation throughout the bottom of his left foot. He is currently taking gabapentin however he believes it makes her sleepy and drowsy.   Physical Exam: General: The patient is alert and oriented x3 in no acute distress.  Dermatology: Skin is warm, dry and supple bilateral lower extremities. Negative for open lesions or macerations. Amputation site has healed completely. Complete reepithelialization has occurred.  Vascular: Palpable pedal pulses bilaterally. No edema or erythema noted. Capillary refill within normal limits.  Neurological: Epicritic and protective threshold grossly absent bilaterally.   Musculoskeletal Exam: Partial second ray amputation left foot   Assessment: 1. Status post partial second ray amputation left foot. Date of surgery 09/29/2017 2. 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 Patient was evaluated. 2. Patient can resume good shoe gear. Amputation site is completely healed. 3. Prescription for Lyrica 100 mg 3 times a day ordered. Discontinue gabapentin 4. Return to clinic when necessary   Edrick Kins, DPM [facility]  Dr. Edrick Kins, DPM    [address], [place] [address]                Office [phone]  Fax [phone]

## 2017-01-30 ENCOUNTER — Ambulatory Visit (INDEPENDENT_AMBULATORY_CARE_PROVIDER_SITE_OTHER): Payer: Self-pay | Admitting: Internal Medicine

## 2017-01-30 ENCOUNTER — Encounter: Payer: Self-pay | Admitting: Internal Medicine

## 2017-01-30 VITALS — BP 142/84 | HR 70 | Resp 12 | Ht 66.25 in | Wt 181.0 lb

## 2017-01-30 DIAGNOSIS — Z794 Long term (current) use of insulin: Secondary | ICD-10-CM

## 2017-01-30 DIAGNOSIS — N63 Unspecified lump in unspecified breast: Secondary | ICD-10-CM

## 2017-01-30 DIAGNOSIS — E782 Mixed hyperlipidemia: Secondary | ICD-10-CM

## 2017-01-30 DIAGNOSIS — I10 Essential (primary) hypertension: Secondary | ICD-10-CM

## 2017-01-30 DIAGNOSIS — B351 Tinea unguium: Secondary | ICD-10-CM

## 2017-01-30 DIAGNOSIS — Z23 Encounter for immunization: Secondary | ICD-10-CM

## 2017-01-30 DIAGNOSIS — R809 Proteinuria, unspecified: Secondary | ICD-10-CM

## 2017-01-30 DIAGNOSIS — H543 Unqualified visual loss, both eyes: Secondary | ICD-10-CM

## 2017-01-30 DIAGNOSIS — E1129 Type 2 diabetes mellitus with other diabetic kidney complication: Secondary | ICD-10-CM

## 2017-01-30 DIAGNOSIS — E118 Type 2 diabetes mellitus with unspecified complications: Secondary | ICD-10-CM

## 2017-01-30 DIAGNOSIS — B353 Tinea pedis: Secondary | ICD-10-CM

## 2017-01-30 HISTORY — DX: Tinea unguium: B35.1

## 2017-01-30 HISTORY — DX: Type 2 diabetes mellitus with other diabetic kidney complication: R80.9

## 2017-01-30 HISTORY — DX: Type 2 diabetes mellitus with other diabetic kidney complication: E11.29

## 2017-01-30 LAB — GLUCOSE, POCT (MANUAL RESULT ENTRY): POC GLUCOSE: 124 mg/dL — AB (ref 70–99)

## 2017-01-30 MED ORDER — TERBINAFINE HCL 250 MG PO TABS: 250.0000 mg | ORAL_TABLET | Freq: Every day | ORAL | 0 refills | Status: DC

## 2017-01-30 MED ORDER — LISINOPRIL 20 MG PO TABS: ORAL_TABLET | ORAL | 11 refills | Status: DC

## 2017-01-30 NOTE — Progress Notes (Signed)
 Subjective:    Patient ID: Willie Jordan, male    DOB: 10/01/1965, 51 y.o.   MRN: [id number]  HPI 1.  DM:  States checking sugars.  Generally running 140-180.  Highest has been around 204.  A1C recently was 7.1%.    2.  Hyperlipidemia:  His cholesterol was quite high.  He was to increase Pitavastatin to 4 mg, but states he did not realize he was to do so.  He did not bring the bottle today, though has his other medications.  States he is eating healthily.   Not physically active.  Begins describing to me how Gabapentin makes him sleepy.   3.  Peripheral Neuropathy:  Gabapentin above really helps with pain.  Apparently Dr. Evans wrote him a prescription for Lyrica to try instead.  He knows he will need to wean gabapentin once he starts the Lyrica.   4.  Essential Hypertension and microalbuminuria:  Recently increased his Lisinopril to 10 mg daily--2 weeks ago.  Taking HCTZ 25 mg at the same time in morning.  No problems.  5.  Concern with vision:  States vision worsened when in the hospital and is concerned may be due to his Metformin ER--not clear why.   6.  Left breast mass:  Has not been scheduled with ultrasound as of yet.  7.  Left foot swelling:  Better since watching sodium and elevating feet.  Worse when on feet a lot.   3.   Lipid Panel     Component Value Date/Time   CHOL 281 (H) 01/09/2017 0903   TRIG 498 (H) 01/09/2017 0903   HDL 29 (L) 01/09/2017 0903   CHOLHDL 7.6 09/27/2016 1643   VLDL 36 09/27/2016 1643   LDLCALC Comment 01/09/2017 0903   LDLDIRECT  05/25/2007 1135    64 (NOTE) ATP III Classification (LDL):      < 100        mg/dL         Optimal     100 - 129     mg/dL         Near or Above Optimal     130 - 159     mg/dL         Borderline High     160 - 189     mg/dL         High      > 190        mg/dL         Very  High    Current Meds  Medication Sig  . blood glucose meter kit and supplies Dispense based on patient and insurance preference. Use up to  four times daily as directed. (FOR ICD-9 250.00, 250.01).  . gabapentin (NEURONTIN) 300 MG capsule Take 1 capsule (300 mg total) by mouth 3 (three) times daily.  . hydrochlorothiazide (HYDRODIURIL) 25 MG tablet Take 1 tablet (25 mg total) by mouth daily with breakfast.  . Insulin Lispro Prot & Lispro (HUMALOG MIX 75/25 KWIKPEN) (75-25) 100 UNIT/ML Kwikpen Inject 26 units subcutaneously in the morning and 22 units in the evening before meals (Patient taking differently: Inject 26 units subcutaneously in the morning and 24 units in the evening before meals)  . Insulin Pen Needle 31G X 5 MM MISC Use as directed twice daily  . lisinopril (PRINIVIL,ZESTRIL) 10 MG tablet 1 tab by mouth daily  . metFORMIN (GLUCOPHAGE-XR) 500 MG 24 hr tablet 2 tabs by mouth with breakfast daily  . Multiple   Vitamin (MULTIVITAMIN) tablet Take 1 tablet by mouth daily.  . Pitavastatin Calcium 2 MG TABS 2 tabs by mouth with evening meal   Allergies  Allergen Reactions  . Aspirin Rash    Review of Systems     Objective:   Physical Exam HEENT:  PERRL, EOMI, unable to see discs well as pupils small Neck:  Supple, No adenopathy Chest:  CTA CV:  RRR without murmur or rub.  Radial and DP pulses normal and equal. Abd: S, NT, No HSM or mass.  + BS LE:  Left foot with chronic pitting edema to ankle.  Skin hyperpigmented on dorsum.  Missing second ray.  Skin at edges of feet and periungual dry and flaking, both feet.  Great toenails and little toenails, both feet as well as 4the toenail on left with thickening and discoloration.       Assessment & Plan:  1.  DM:  Much improved control Needs eye exam. To call for influenza vaccine schedule in September.  2.  Peripheral neuropathy:  Patient knows if he is to switch to Lyrica to stop the Gabapentin.  Controlling pain in feet well, however.    3.  Essential Hypertension and microalbuminuria:  Not controlled with increase of Lisinopril to 10 mg.  Increase to 20 mg with BMP  and bp check in 1 week.  4.  Hyperlipidemia:  Pharmacy did not receive phone order for increase of Pitavastatin to 4 mg.  Patient will double up on his 2 mg as prescribed.  Repeat FLP, hepatic profile in 6 weeks.  5.  Toenail onychomycosis and tinea pedis:  Terbinafine 250 mg once daily for 90 days.  Will check liver enzymes with fasting labs as above in 6 weeks and 12 weeks. Tea tree oil nightly to feet and periungual areas.   Discussed shoe and shower care to prevent reinfection.  6.  Foot swelling:  Continue with low sodium diet, elevation of foot.  Also taking HCTZ 25 mg in a.m.  7.  Left breast mass:  Await mammogram.  Patient filled out papers 2 weeks ago.  Still being evaluated at [facility].

## 2017-01-30 NOTE — Addendum Note (Signed)
Addended by: Serafina Mitchell on: 01/30/2017 04:01 PM   Modules accepted: Orders

## 2017-01-30 NOTE — Patient Instructions (Signed)
For foot and toenail fungus:  Spray your shoes with Lysol or Lotrimin Antifungal spray when you start the medication for your feet.   Re-spray your the shoes you wear with each wearing and allow to dry before wearing again You will need to continue to spray the shoes you have during the infection of your toenails and feet have been thrown out due to wear.  Once your toenails and feet are clear of infection, the shoes you buy new do not necessarily need to be sprayed. Clean your shower floor once to twice daily with bleach containing cleaner.  Tea Tree Oil to feet (not between toes) after washing feet every night. Rub into all the dry and rough areas, especially around toenails.

## 2017-02-05 ENCOUNTER — Other Ambulatory Visit (INDEPENDENT_AMBULATORY_CARE_PROVIDER_SITE_OTHER): Payer: Self-pay

## 2017-02-05 VITALS — BP 170/90 | HR 76

## 2017-02-05 DIAGNOSIS — Z79899 Other long term (current) drug therapy: Secondary | ICD-10-CM

## 2017-02-06 LAB — BASIC METABOLIC PANEL
BUN/Creatinine Ratio: 17 (ref 9–20)
BUN: 13 mg/dL (ref 6–24)
CO2: 26 mmol/L (ref 20–29)
CREATININE: 0.78 mg/dL (ref 0.76–1.27)
Calcium: 8.7 mg/dL (ref 8.7–10.2)
Chloride: 103 mmol/L (ref 96–106)
GFR calc Af Amer: 123 mL/min/{1.73_m2} (ref >59)
GFR, EST NON AFRICAN AMERICAN: 107 mL/min/{1.73_m2} (ref >59)
Glucose: 205 mg/dL — ABNORMAL HIGH (ref 65–99)
POTASSIUM: 4.3 mmol/L (ref 3.5–5.2)
SODIUM: 139 mmol/L (ref 134–144)

## 2017-02-06 MED ORDER — AMLODIPINE BESYLATE 5 MG PO TABS: 5.0000 mg | ORAL_TABLET | Freq: Every day | ORAL | 11 refills | Status: DC

## 2017-02-06 NOTE — Progress Notes (Signed)
Per Dr. Amil Amen add Amlodipine 5 mg 1 daily. Patient to recheck BP and Pulse in 1 week. Rx sent to pharmacy and patient informed.

## 2017-02-10 ENCOUNTER — Other Ambulatory Visit: Payer: Self-pay

## 2017-02-10 MED ORDER — AMLODIPINE BESYLATE 5 MG PO TABS: 5.0000 mg | ORAL_TABLET | Freq: Every day | ORAL | 11 refills | Status: DC

## 2017-02-13 ENCOUNTER — Telehealth: Payer: Self-pay | Admitting: *Deleted

## 2017-02-13 ENCOUNTER — Other Ambulatory Visit: Payer: Self-pay

## 2017-02-13 NOTE — Telephone Encounter (Signed)
Pt called for paperwork for pain medication. I had completed our portion of the [employer] Patient Assistance Program, and called pt to pick up. I told him he would need a copy of the original prescription and if he didn't have I could write as before for him and he would need to supply [employer] with household information and income information. Pt states he didn't understand why the doctor would write for a medicine that would take so much paperwork. I told pt the doctor did not know he would be applying for assistance for the medication.Pt asked if any one spoke spanish. I told pt no, and started to tell him that he had brought the paperwork to our office with the understanding we could complete the medical part and that he would have to take the paperwork to someone that would help him with the financial portion and pt stated nevermind and hung up.

## 2017-02-18 ENCOUNTER — Other Ambulatory Visit: Payer: Self-pay

## 2017-02-26 NOTE — Addendum Note (Signed)
Addendum  created 02/26/17 1145 by Roberts Gaudy, MD   Sign clinical note

## 2017-03-17 ENCOUNTER — Ambulatory Visit (INDEPENDENT_AMBULATORY_CARE_PROVIDER_SITE_OTHER): Payer: Self-pay | Admitting: Internal Medicine

## 2017-03-17 ENCOUNTER — Encounter: Payer: Self-pay | Admitting: Internal Medicine

## 2017-03-17 VITALS — BP 158/80 | HR 74 | Resp 12 | Ht 66.25 in | Wt 182.0 lb

## 2017-03-17 DIAGNOSIS — E782 Mixed hyperlipidemia: Secondary | ICD-10-CM

## 2017-03-17 DIAGNOSIS — E1140 Type 2 diabetes mellitus with diabetic neuropathy, unspecified: Secondary | ICD-10-CM

## 2017-03-17 DIAGNOSIS — G629 Polyneuropathy, unspecified: Secondary | ICD-10-CM

## 2017-03-17 DIAGNOSIS — Z79899 Other long term (current) drug therapy: Secondary | ICD-10-CM

## 2017-03-17 DIAGNOSIS — R5383 Other fatigue: Secondary | ICD-10-CM

## 2017-03-17 DIAGNOSIS — B351 Tinea unguium: Secondary | ICD-10-CM

## 2017-03-17 DIAGNOSIS — E1129 Type 2 diabetes mellitus with other diabetic kidney complication: Secondary | ICD-10-CM

## 2017-03-17 DIAGNOSIS — E11319 Type 2 diabetes mellitus with unspecified diabetic retinopathy without macular edema: Secondary | ICD-10-CM

## 2017-03-17 DIAGNOSIS — Z794 Long term (current) use of insulin: Secondary | ICD-10-CM

## 2017-03-17 DIAGNOSIS — E1165 Type 2 diabetes mellitus with hyperglycemia: Secondary | ICD-10-CM

## 2017-03-17 DIAGNOSIS — E1142 Type 2 diabetes mellitus with diabetic polyneuropathy: Secondary | ICD-10-CM

## 2017-03-17 DIAGNOSIS — R809 Proteinuria, unspecified: Secondary | ICD-10-CM

## 2017-03-17 DIAGNOSIS — I10 Essential (primary) hypertension: Secondary | ICD-10-CM

## 2017-03-17 HISTORY — DX: Type 2 diabetes mellitus with diabetic neuropathy, unspecified: E11.40

## 2017-03-17 HISTORY — DX: Polyneuropathy, unspecified: G62.9

## 2017-03-17 LAB — GLUCOSE, POCT (MANUAL RESULT ENTRY): POC GLUCOSE: 187 mg/dL — AB (ref 70–99)

## 2017-03-17 MED ORDER — LISINOPRIL-HYDROCHLOROTHIAZIDE 20-25 MG PO TABS: ORAL_TABLET | ORAL | 3 refills | Status: DC

## 2017-03-17 NOTE — Addendum Note (Signed)
Addended by: Marcelino Duster on: 03/17/2017 10:38 AM   Modules accepted: Orders

## 2017-03-17 NOTE — Patient Instructions (Signed)
Free flu vaccine at flu vaccine clinics:  September 20 8:30 to 11:30 a.m. And September 29th [facility] 1-4 p.m.  Both at [facility] next door to clinic   For foot and toenail fungus:  Spray your shoes with Lysol or Lotrimin Antifungal spray when you start the medication for your feet.   Re-spray your the shoes you wear with each wearing and allow to dry before wearing again You will need to continue to spray the shoes you have during the infection of your toenails and feet have been thrown out due to wear.  Once your toenails and feet are clear of infection, the shoes you buy new do not necessarily need to be sprayed. Clean your shower floor once to twice daily with bleach containing cleaner.

## 2017-03-17 NOTE — Progress Notes (Signed)
Subjective:    Patient ID: Willie Jordan, male    DOB: 14-Mar-1966, 51 y.o.   MRN: [id number]  HPI 1.  DM:  Sugars running 160 in the morning.  Not checking in the afternoon. Had eye check with Marcial Pacas, O.D. last Thursday  and reportedly had diabetic changes to retina.  He will need a retina specialist referral. Discussed flu vaccine clinics upcoming. Using Humalog 75/25 mix 26 units in the morning and 24 in the evening.  Metformin ER 1000 mg in the morning with breakfast.  2.  Left breast swelling:  Unable to get him a scholarship for this through [facility] system.  He states the swelling has since resolved.  3.  Essential Hypertension and microalbuminuria:  BP still elevated with 5 mg Amlodipine, 20 mg Lisinopril and 25 mg HCTZ.  Difficult to tell looking at bottle if he is actually missing or not.    4.  Toenail onychomycosis:  Taking Terbinafine just short of 6 weeks.  No problems.  Does have normal nail at base--4 mm or so.    5.  Foot swelling:  Still a bit swollen where had surgery, but improved.  6.  Peripheral neuropathy:  Felt too tired taking Gabapentin during day, so returned to 300 mg at night only.  He does not feel tired in the morning with this dosing.  Has not tried to take up his nighttime dose from the 300. Did not get Lyrica as was not interested in filling out the paperwork.   7.  Hyperlipidemia:  Taking Pitavastatin 4 mg daily (Livalo)  Feels he is tolerating fine.  Feels he is eating in a healthy manner.  No physical activity.   States he is just too fatigued to do anything.  8.  Fatigue:  States not sleepy, just fatigued.  Is gaining weight.    Current Meds  Medication Sig  . amLODipine (NORVASC) 5 MG tablet Take 1 tablet (5 mg total) by mouth daily.  . blood glucose meter kit and supplies Dispense based on patient and insurance preference. Use up to four times daily as directed. (FOR ICD-9 250.00, 250.01).  Marland Kitchen gabapentin (NEURONTIN) 300 MG capsule Take 1  capsule (300 mg total) by mouth 3 (three) times daily.  . hydrochlorothiazide (HYDRODIURIL) 25 MG tablet Take 1 tablet (25 mg total) by mouth daily with breakfast.  . Insulin Lispro Prot & Lispro (HUMALOG MIX 75/25 KWIKPEN) (75-25) 100 UNIT/ML Kwikpen Inject 26 units subcutaneously in the morning and 22 units in the evening before meals (Patient taking differently: Inject 26 units subcutaneously in the morning and 24 units in the evening before meals)  . Insulin Pen Needle 31G X 5 MM MISC Use as directed twice daily  . lisinopril (PRINIVIL,ZESTRIL) 20 MG tablet 1 tab by mouth daily  . metFORMIN (GLUCOPHAGE-XR) 500 MG 24 hr tablet 2 tabs by mouth with breakfast daily  . Pitavastatin Calcium 2 MG TABS 2 tabs by mouth with evening meal  . terbinafine (LAMISIL) 250 MG tablet Take 1 tablet (250 mg total) by mouth daily. For 90 days    Allergies  Allergen Reactions  . Aspirin Rash     Review of Systems     Objective:   Physical Exam  HEENT:  Face puffy, doughy appearing. Neck:  Supple, No adenopathy, no thyromegaly Chest:  CTA.  No left breast swelling now. CV:  RRR without murmur or rub, radial and DP pulses normal and equal Abd:  S, NT, No HSM or mass, +  BS LE:  Mild edema with dark discoloration of skin of left foot--a bit woody.  Foot wound well healed.  4 mm of normal nail at base of great toenail.        Assessment & Plan:  1.  DM:  Again asked he keep better track of sugars, though sugars have improved since his toe amputation.  2.  Essential Hypertension and microalbuminuria:  Change HCTZ to Lisinopril 13m/HCTZ 25 mg daily and continue his separate Lisinopril 20 mg as well as Amlodipine 5 mg daily.  3.  Fatigue:  Check TSH with change in facial appearance and fatigue, CBC, CMP as well.  4.  Toenail onychomycosis:  Responding nicely to Terbinafine 250 mg daily.  CMP also to check liver enzymes.  5.  Left breast swelling:  Resolved.  6.  Peripheral Neuropathy:  Hold on  titrating up on Gabapentin until fatigue issue resolved.  7.  Hyperlipidemia:  Continue Pitivastatin 4 mg daily, FLP, CMP today. Follow up in 12 weeks.

## 2017-03-18 LAB — LIPID PANEL W/O CHOL/HDL RATIO
Cholesterol, Total: 279 mg/dL — ABNORMAL HIGH (ref 100–199)
HDL: 29 mg/dL — AB (ref >39)
Triglycerides: 612 mg/dL (ref 0–149)

## 2017-03-18 LAB — COMPREHENSIVE METABOLIC PANEL
ALBUMIN: 3.6 g/dL (ref 3.5–5.5)
ALK PHOS: 101 IU/L (ref 39–117)
ALT: 18 IU/L (ref 0–44)
AST: 16 IU/L (ref 0–40)
Albumin/Globulin Ratio: 1.2 (ref 1.2–2.2)
BUN / CREAT RATIO: 24 — AB (ref 9–20)
BUN: 29 mg/dL — AB (ref 6–24)
Bilirubin Total: <0.2 mg/dL (ref 0.0–1.2)
CALCIUM: 9 mg/dL (ref 8.7–10.2)
CO2: 25 mmol/L (ref 20–29)
CREATININE: 1.22 mg/dL (ref 0.76–1.27)
Chloride: 101 mmol/L (ref 96–106)
GFR calc Af Amer: 81 mL/min/{1.73_m2} (ref >59)
GFR calc non Af Amer: 70 mL/min/{1.73_m2} (ref >59)
Globulin, Total: 3 g/dL (ref 1.5–4.5)
Glucose: 155 mg/dL — ABNORMAL HIGH (ref 65–99)
Potassium: 4.1 mmol/L (ref 3.5–5.2)
Sodium: 139 mmol/L (ref 134–144)
Total Protein: 6.6 g/dL (ref 6.0–8.5)

## 2017-03-18 LAB — CBC WITH DIFFERENTIAL/PLATELET
Basophils Absolute: 0 10*3/uL (ref 0.0–0.2)
Basos: 1 %
EOS (ABSOLUTE): 0.2 10*3/uL (ref 0.0–0.4)
EOS: 3 %
HEMATOCRIT: 38.1 % (ref 37.5–51.0)
HEMOGLOBIN: 12.7 g/dL — AB (ref 13.0–17.7)
Immature Grans (Abs): 0 10*3/uL (ref 0.0–0.1)
Immature Granulocytes: 0 %
Lymphocytes Absolute: 1.5 10*3/uL (ref 0.7–3.1)
Lymphs: 23 %
MCH: 27.3 pg (ref 26.6–33.0)
MCHC: 33.3 g/dL (ref 31.5–35.7)
MCV: 82 fL (ref 79–97)
MONOCYTES: 8 %
Monocytes Absolute: 0.5 10*3/uL (ref 0.1–0.9)
NEUTROS PCT: 65 %
Neutrophils Absolute: 4.2 10*3/uL (ref 1.4–7.0)
Platelets: 187 10*3/uL (ref 150–379)
RBC: 4.65 x10E6/uL (ref 4.14–5.80)
RDW: 13.8 % (ref 12.3–15.4)
WBC: 6.4 10*3/uL (ref 3.4–10.8)

## 2017-03-18 LAB — TSH: TSH: 1.95 u[IU]/mL (ref 0.450–4.500)

## 2017-03-25 ENCOUNTER — Telehealth: Payer: Self-pay | Admitting: Internal Medicine

## 2017-03-25 NOTE — Telephone Encounter (Signed)
Patient came in with letter (scanned in patient's records) from [place] that states patient was approved at 100% discount that will be applied to patient's balances for eligible services. Financial Assistance Termination Date: 07/26/2017  Now that patient has the assistance he would like to know if Dr. Amil Amen could possibly refer him to see someone about his vision. Please advise

## 2017-03-27 NOTE — Telephone Encounter (Signed)
I referred him through th orange card.  I am not aware of an Ophthalmologist that is under the [facility] umbrella

## 2017-03-28 LAB — COMPREHENSIVE METABOLIC PANEL

## 2017-03-28 LAB — CBC WITH DIFFERENTIAL/PLATELET
BASOS ABS: 0 10*3/uL (ref 0.0–0.2)
Basos: 1 %
EOS (ABSOLUTE): 0.2 10*3/uL (ref 0.0–0.4)
Eos: 3 %
Hematocrit: 38.1 % (ref 37.5–51.0)
Hemoglobin: 12.7 g/dL — ABNORMAL LOW (ref 13.0–17.7)
IMMATURE GRANULOCYTES: 0 %
Immature Grans (Abs): 0 10*3/uL (ref 0.0–0.1)
LYMPHS ABS: 1.5 10*3/uL (ref 0.7–3.1)
Lymphs: 23 %
MCH: 27.3 pg (ref 26.6–33.0)
MCHC: 33.3 g/dL (ref 31.5–35.7)
MCV: 82 fL (ref 79–97)
MONOS ABS: 0.5 10*3/uL (ref 0.1–0.9)
Monocytes: 8 %
NEUTROS PCT: 65 %
Neutrophils Absolute: 4.2 10*3/uL (ref 1.4–7.0)
Platelets: 187 10*3/uL (ref 150–379)
RBC: 4.65 x10E6/uL (ref 4.14–5.80)
RDW: 13.8 % (ref 12.3–15.4)
WBC: 6.4 10*3/uL (ref 3.4–10.8)

## 2017-03-28 LAB — LIPID PANEL W/O CHOL/HDL RATIO

## 2017-03-28 LAB — TSH

## 2017-04-09 LAB — IRON AND TIBC
IRON SATURATION: 25 % (ref 15–55)
Iron: 67 ug/dL (ref 38–169)
Total Iron Binding Capacity: 271 ug/dL (ref 250–450)
UIBC: 204 ug/dL (ref 111–343)

## 2017-04-09 LAB — SPECIMEN STATUS REPORT

## 2017-04-16 DIAGNOSIS — E133519 Other specified diabetes mellitus with proliferative diabetic retinopathy with macular edema, unspecified eye: Secondary | ICD-10-CM | POA: Insufficient documentation

## 2017-04-22 ENCOUNTER — Encounter: Payer: Self-pay | Admitting: Internal Medicine

## 2017-04-22 DIAGNOSIS — E133519 Other specified diabetes mellitus with proliferative diabetic retinopathy with macular edema, unspecified eye: Secondary | ICD-10-CM

## 2017-04-22 NOTE — Progress Notes (Signed)
Patient ID: Willie Jordan, male   DOB: 11-Jan-1966, 51 y.o.   MRN: [id number] Documentation of [facility] note from 04/16/2017

## 2017-04-23 ENCOUNTER — Ambulatory Visit: Payer: Self-pay | Admitting: Internal Medicine

## 2017-04-27 ENCOUNTER — Ambulatory Visit
Admission: RE | Admit: 2017-04-27 | Discharge: 2017-04-27 | Disposition: A | Discharge status: Home / Self Care | Payer: No Typology Code available for payment source | Source: Ambulatory Visit | Attending: Internal Medicine | Admitting: Internal Medicine

## 2017-04-27 ENCOUNTER — Ambulatory Visit: Admission: RE | Admit: 2017-04-27 | Payer: Self-pay | Source: Ambulatory Visit

## 2017-04-27 DIAGNOSIS — N6320 Unspecified lump in the left breast, unspecified quadrant: Secondary | ICD-10-CM

## 2017-04-28 ENCOUNTER — Ambulatory Visit: Payer: Self-pay | Admitting: Internal Medicine

## 2017-05-05 ENCOUNTER — Other Ambulatory Visit (INDEPENDENT_AMBULATORY_CARE_PROVIDER_SITE_OTHER): Payer: Self-pay

## 2017-05-05 DIAGNOSIS — Z79899 Other long term (current) drug therapy: Secondary | ICD-10-CM

## 2017-05-05 DIAGNOSIS — E1165 Type 2 diabetes mellitus with hyperglycemia: Secondary | ICD-10-CM

## 2017-05-05 DIAGNOSIS — Z794 Long term (current) use of insulin: Secondary | ICD-10-CM

## 2017-05-06 LAB — HEPATIC FUNCTION PANEL
ALBUMIN: 3.3 g/dL — AB (ref 3.5–5.5)
ALK PHOS: 95 IU/L (ref 39–117)
ALT: 27 IU/L (ref 0–44)
AST: 19 IU/L (ref 0–40)
Bilirubin, Direct: 0.06 mg/dL (ref 0.00–0.40)
Total Protein: 6 g/dL (ref 6.0–8.5)

## 2017-05-06 LAB — HGB A1C W/O EAG: Hgb A1c MFr Bld: 6.8 % — ABNORMAL HIGH (ref 4.8–5.6)

## 2017-05-13 ENCOUNTER — Telehealth: Payer: Self-pay | Admitting: Internal Medicine

## 2017-05-13 NOTE — Telephone Encounter (Signed)
Patient called stating needs prescription for Atorvastatin 40 mg. Please advise

## 2017-05-25 NOTE — Telephone Encounter (Signed)
Not sure why this bypassed nursing. Please check with [facility].  I sent out a note to have his Rx for Pitavastatin changed to Atorvastatin 40 mg back in October and don't see that changed in his record.   If he did get the Atorvastatin, why are there no refills?  Need this clarified, not sure if there is a note I am unable to see somewhere.

## 2017-05-27 NOTE — Telephone Encounter (Signed)
Left message for patient to call the office

## 2017-06-02 NOTE — Telephone Encounter (Signed)
Called patient again and phone is now disconnected.

## 2017-06-03 ENCOUNTER — Other Ambulatory Visit: Payer: Self-pay

## 2017-06-03 MED ORDER — ATORVASTATIN CALCIUM 40 MG PO TABS: 40.0000 mg | ORAL_TABLET | Freq: Every day | ORAL | 11 refills | Status: DC

## 2017-06-04 ENCOUNTER — Other Ambulatory Visit: Payer: Self-pay

## 2017-06-23 ENCOUNTER — Encounter: Payer: Self-pay | Admitting: Internal Medicine

## 2017-06-23 ENCOUNTER — Ambulatory Visit: Payer: Self-pay | Admitting: Internal Medicine

## 2017-06-23 VITALS — BP 180/90 | HR 80 | Resp 12 | Ht 66.25 in | Wt 176.0 lb

## 2017-06-23 DIAGNOSIS — L03031 Cellulitis of right toe: Secondary | ICD-10-CM

## 2017-06-23 DIAGNOSIS — I10 Essential (primary) hypertension: Secondary | ICD-10-CM

## 2017-06-23 DIAGNOSIS — E1165 Type 2 diabetes mellitus with hyperglycemia: Secondary | ICD-10-CM

## 2017-06-23 DIAGNOSIS — Z794 Long term (current) use of insulin: Secondary | ICD-10-CM

## 2017-06-23 LAB — GLUCOSE, POCT (MANUAL RESULT ENTRY): POC Glucose: 237 mg/dl — AB (ref 70–99)

## 2017-06-23 MED ORDER — CLINDAMYCIN HCL 300 MG PO CAPS: 300.0000 mg | ORAL_CAPSULE | Freq: Three times a day (TID) | ORAL | 0 refills | Status: DC

## 2017-06-23 MED ORDER — LEVOFLOXACIN 750 MG PO TABS: 750.0000 mg | ORAL_TABLET | Freq: Every day | ORAL | 0 refills | Status: DC

## 2017-06-23 NOTE — Patient Instructions (Signed)
Keep foot elevated

## 2017-06-23 NOTE — Progress Notes (Signed)
   Subjective:    Patient ID: Willie Jordan, male    DOB: [birth year], 51 y.o.   MRN: [id number]  HPI   Walked in this morning.  Right foot started with blackening nails 2 weeks ago.  2 nights ago, checked toe and was looking black.  Last night, tried to clean the toe, tried to peel back skin and "make a hole"   Not clear why he did not seek care earlier.  States "I know" and sounds like he felt he knew how to treat based on what happened to his other foot. No fever.   No pain from foot, but has peripheral neuropathy.   Not checking feet before bedtime. He denies dropping something on the toes to cause bleeding in subungual area.  No tight fitting shoes or stubbing of toes.  Glucose has been running 160-180 in the morning fasting.  Not checking in the evening before dinner.   Taking both Metformin and Humalog mix 26 units in a.m. And 24 units in the evening.  Hypertension:  Out of Amlodipine for a week until refilled on 12/14.    Current Meds  Medication Sig  . amLODipine (NORVASC) 5 MG tablet Take 1 tablet (5 mg total) by mouth daily.  Marland Kitchen atorvastatin (LIPITOR) 40 MG tablet Take 1 tablet (40 mg total) by mouth daily.  . blood glucose meter kit and supplies Dispense based on patient and insurance preference. Use up to four times daily as directed. (FOR ICD-9 250.00, 250.01).  Marland Kitchen gabapentin (NEURONTIN) 300 MG capsule Take 1 capsule (300 mg total) by mouth 3 (three) times daily.  . Insulin Lispro Prot & Lispro (HUMALOG MIX 75/25 KWIKPEN) (75-25) 100 UNIT/ML Kwikpen Inject 26 units subcutaneously in the morning and 22 units in the evening before meals (Patient taking differently: Inject 26 units subcutaneously in the morning and 24 units in the evening before meals)  . Insulin Pen Needle 31G X 5 MM MISC Use as directed twice daily  . lisinopril (PRINIVIL,ZESTRIL) 20 MG tablet 1 tab by mouth daily  . lisinopril-hydrochlorothiazide (PRINZIDE,ZESTORETIC) 20-25 MG tablet 1 tab by mouth in morning  with other 20 mg Lisinopril  . metFORMIN (GLUCOPHAGE-XR) 500 MG 24 hr tablet 2 tabs by mouth with breakfast daily   Allergies  Allergen Reactions  . Aspirin Rash          Review of Systems     Objective:   Physical Exam   NAD Lungs:  CTA CV:  RRR without murmur or rub LE:  Right foot:  2nd and 4th toenails with blackening in subungual area, no definite green coloration.  Redness and swelling of both toes.  Has thickened, peeling skin on both toes, particularly the 4th.  Toes are nontender to palapation. No erythema or swelling extending onto distal foot. Decreased DP pulses bilaterally. Missing 2nd ray in left foot.          Assessment & Plan:  Cellulitis with either subungual hematomas of both 2nd and 4th toe or perhaps pseudomonal involvement. Elevate feet and stay off of them until further notice. Levaquin 750 mg daily and Clindamycin 300 mg 3 times daily x 10 days. CBC, CMP Call if develops diarrhea, though has a follow up tomorrow.  DM:  To follow sugars more closely and work with diet and meds to keep under 200.    Hypertension:  Not to miss meds.

## 2017-06-24 ENCOUNTER — Encounter: Payer: Self-pay | Admitting: Internal Medicine

## 2017-06-24 ENCOUNTER — Ambulatory Visit: Payer: Self-pay | Admitting: Internal Medicine

## 2017-06-24 VITALS — BP 158/78 | HR 72 | Resp 12 | Ht 66.25 in | Wt 179.0 lb

## 2017-06-24 DIAGNOSIS — E1165 Type 2 diabetes mellitus with hyperglycemia: Secondary | ICD-10-CM

## 2017-06-24 DIAGNOSIS — Z794 Long term (current) use of insulin: Secondary | ICD-10-CM

## 2017-06-24 DIAGNOSIS — L03031 Cellulitis of right toe: Secondary | ICD-10-CM

## 2017-06-24 LAB — CBC WITH DIFFERENTIAL/PLATELET
Basophils Absolute: 0.1 10*3/uL (ref 0.0–0.2)
Basos: 1 %
EOS (ABSOLUTE): 0.1 10*3/uL (ref 0.0–0.4)
EOS: 2 %
Hematocrit: 40.9 % (ref 37.5–51.0)
Hemoglobin: 13.6 g/dL (ref 13.0–17.7)
IMMATURE GRANS (ABS): 0 10*3/uL (ref 0.0–0.1)
IMMATURE GRANULOCYTES: 0 %
LYMPHS: 24 %
Lymphocytes Absolute: 1.6 10*3/uL (ref 0.7–3.1)
MCH: 27.4 pg (ref 26.6–33.0)
MCHC: 33.3 g/dL (ref 31.5–35.7)
MCV: 82 fL (ref 79–97)
MONOS ABS: 0.6 10*3/uL (ref 0.1–0.9)
Monocytes: 8 %
NEUTROS PCT: 65 %
Neutrophils Absolute: 4.3 10*3/uL (ref 1.4–7.0)
PLATELETS: 264 10*3/uL (ref 150–379)
RBC: 4.97 x10E6/uL (ref 4.14–5.80)
RDW: 13.6 % (ref 12.3–15.4)
WBC: 6.7 10*3/uL (ref 3.4–10.8)

## 2017-06-24 LAB — COMPREHENSIVE METABOLIC PANEL
ALT: 42 IU/L (ref 0–44)
AST: 61 IU/L — AB (ref 0–40)
Albumin/Globulin Ratio: 1.2 (ref 1.2–2.2)
Albumin: 3.5 g/dL (ref 3.5–5.5)
Alkaline Phosphatase: 112 IU/L (ref 39–117)
BUN/Creatinine Ratio: 26 — ABNORMAL HIGH (ref 9–20)
BUN: 27 mg/dL — AB (ref 6–24)
Bilirubin Total: 0.3 mg/dL (ref 0.0–1.2)
CALCIUM: 9.3 mg/dL (ref 8.7–10.2)
CO2: 26 mmol/L (ref 20–29)
CREATININE: 1.03 mg/dL (ref 0.76–1.27)
Chloride: 101 mmol/L (ref 96–106)
GFR calc Af Amer: 98 mL/min/{1.73_m2} (ref >59)
GFR, EST NON AFRICAN AMERICAN: 85 mL/min/{1.73_m2} (ref >59)
Globulin, Total: 3 g/dL (ref 1.5–4.5)
Glucose: 191 mg/dL — ABNORMAL HIGH (ref 65–99)
Potassium: 4.8 mmol/L (ref 3.5–5.2)
Sodium: 140 mmol/L (ref 134–144)
TOTAL PROTEIN: 6.5 g/dL (ref 6.0–8.5)

## 2017-06-24 LAB — GLUCOSE, POCT (MANUAL RESULT ENTRY): POC Glucose: 149 mg/dl — AB (ref 70–99)

## 2017-06-24 NOTE — Progress Notes (Signed)
   Subjective:    Patient ID: Willie Jordan, male    DOB: Aug 17, 1965, 51 y.o.   MRN: [id number]  HPI  Follow up of cellulitis, right 2nd and 4th toes. Feet feel like they are burning.   Tolerating Levaquin 750 mg and Clindamycin 300 mg 3 times daily.  Has taken 2 doses of Levaquin and 3 of Clindamycin.  Sugars under 200 last evening and this morning.  Current Meds  Medication Sig  . amLODipine (NORVASC) 5 MG tablet Take 1 tablet (5 mg total) by mouth daily.  Marland Kitchen atorvastatin (LIPITOR) 40 MG tablet Take 1 tablet (40 mg total) by mouth daily.  . blood glucose meter kit and supplies Dispense based on patient and insurance preference. Use up to four times daily as directed. (FOR ICD-9 250.00, 250.01).  Marland Kitchen gabapentin (NEURONTIN) 300 MG capsule Take 1 capsule (300 mg total) by mouth 3 (three) times daily.  . Insulin Pen Needle 31G X 5 MM MISC Use as directed twice daily  . levofloxacin (LEVAQUIN) 750 MG tablet Take 1 tablet (750 mg total) by mouth daily. (Patient not taking: Reported on 07/06/2017)  . lisinopril (PRINIVIL,ZESTRIL) 20 MG tablet 1 tab by mouth daily  . lisinopril-hydrochlorothiazide (PRINZIDE,ZESTORETIC) 20-25 MG tablet 1 tab by mouth in morning with other 20 mg Lisinopril  . metFORMIN (GLUCOPHAGE-XR) 500 MG 24 hr tablet 2 tabs by mouth with breakfast daily  . Multiple Vitamin (MULTIVITAMIN) tablet Take 1 tablet by mouth daily.  . [DISCONTINUED] clindamycin (CLEOCIN) 300 MG capsule Take 1 capsule (300 mg total) by mouth 3 (three) times daily. (Patient not taking: Reported on 06/26/2017)   Allergies  Allergen Reactions  . Aspirin Rash      Review of Systems     Objective:   Physical Exam  Right 2nd and 4th toes with significant decrease in swelling and erythema.  Black discoloration under nails less prominent.  Skin peeling over 4th digit drying.  NT      Assessment & Plan:  Cellulitis of right 2nd and 4th toes:  Much improved.  To continue to stay off feet and  elevate.   Complete 10 day course of Levaquin and Clindamycin. Follow up in 2 days.

## 2017-06-26 ENCOUNTER — Ambulatory Visit: Payer: Self-pay | Admitting: Internal Medicine

## 2017-06-26 ENCOUNTER — Encounter: Payer: Self-pay | Admitting: Internal Medicine

## 2017-06-26 VITALS — BP 138/80 | HR 72 | Ht 66.25 in | Wt 174.5 lb

## 2017-06-26 DIAGNOSIS — E1165 Type 2 diabetes mellitus with hyperglycemia: Secondary | ICD-10-CM

## 2017-06-26 DIAGNOSIS — Z794 Long term (current) use of insulin: Secondary | ICD-10-CM

## 2017-06-26 DIAGNOSIS — L03031 Cellulitis of right toe: Secondary | ICD-10-CM

## 2017-06-26 DIAGNOSIS — R197 Diarrhea, unspecified: Secondary | ICD-10-CM

## 2017-06-26 DIAGNOSIS — R112 Nausea with vomiting, unspecified: Secondary | ICD-10-CM

## 2017-06-26 MED ORDER — ONDANSETRON 4 MG PO TBDP: 4.0000 mg | ORAL_TABLET | Freq: Three times a day (TID) | ORAL | 0 refills | Status: DC | PRN

## 2017-06-26 NOTE — Progress Notes (Signed)
   Subjective:    Patient ID: Willie Jordan, male    DOB: August 28, 1965, 51 y.o.   MRN: [id number]  HPI   Stopped Clindamycin after his second dose yesterday (has missed last night and this morning) as he has developed diarrhea.  Last episode was 11 p.m. Last night.  He has not had anything to eat last night or this morning.  Has not had anything to drink as well.   Later, states he has had some nausea and vomiting this morning--1 episode.  Vomited clear fluid after drinking water. Feeling okay now. Foot continues to improve from his perspective.   Glucose has been well controlled  Current Meds  Medication Sig  . amLODipine (NORVASC) 5 MG tablet Take 1 tablet (5 mg total) by mouth daily.  Marland Kitchen atorvastatin (LIPITOR) 40 MG tablet Take 1 tablet (40 mg total) by mouth daily.  . blood glucose meter kit and supplies Dispense based on patient and insurance preference. Use up to four times daily as directed. (FOR ICD-9 250.00, 250.01).  Marland Kitchen gabapentin (NEURONTIN) 300 MG capsule Take 1 capsule (300 mg total) by mouth 3 (three) times daily.  . Insulin Lispro Prot & Lispro (HUMALOG MIX 75/25 KWIKPEN) (75-25) 100 UNIT/ML Kwikpen Inject 26 units subcutaneously in the morning and 22 units in the evening before meals (Patient taking differently: Inject 26 units subcutaneously in the morning and 24 units in the evening before meals)  . levofloxacin (LEVAQUIN) 750 MG tablet Take 1 tablet (750 mg total) by mouth daily.  Marland Kitchen lisinopril (PRINIVIL,ZESTRIL) 20 MG tablet 1 tab by mouth daily  . lisinopril-hydrochlorothiazide (PRINZIDE,ZESTORETIC) 20-25 MG tablet 1 tab by mouth in morning with other 20 mg Lisinopril  . metFORMIN (GLUCOPHAGE-XR) 500 MG 24 hr tablet 2 tabs by mouth with breakfast daily      Review of Systems     Objective:   Physical Exam NAD HEENT:  MMM Neck:  Supple, No adenopathy Chest:  CTA CV:  RRR without murmur or rub, radial pulses normal and equal Right foot:  2nd toe appears normal other  than black discoloration beneath nail, which is now also less apparent.  4th toe without any  Redness or swelling as well now.  Skin continues to exfoliate, but tissue beneath appears healthy with good cap refill.  Nail bed also black, but fading as well.  No tenderness       Assessment & Plan:  Cellulitis of right toes:  Healing nicely.  He is to continue the Levofloxacin 750  Daily.  To hold Clindamycin for now.  When nausea and vomiting resolves, to try restarting at 150 mg 3 times daily.   He is to call a report tomorrow early afternoon and will help him make that decision. He has a follow up for labs in 1 week--will decide long term follow up then.

## 2017-06-26 NOTE — Patient Instructions (Addendum)
[facility]abla clinic in [place].

## 2017-07-03 ENCOUNTER — Other Ambulatory Visit: Payer: Self-pay

## 2017-07-03 DIAGNOSIS — E782 Mixed hyperlipidemia: Secondary | ICD-10-CM

## 2017-07-03 DIAGNOSIS — Z79899 Other long term (current) drug therapy: Secondary | ICD-10-CM

## 2017-07-04 LAB — HEPATIC FUNCTION PANEL
ALBUMIN: 3.3 g/dL — AB (ref 3.5–5.5)
ALK PHOS: 116 IU/L (ref 39–117)
ALT: 22 IU/L (ref 0–44)
AST: 18 IU/L (ref 0–40)
BILIRUBIN TOTAL: 0.2 mg/dL (ref 0.0–1.2)
BILIRUBIN, DIRECT: 0.05 mg/dL (ref 0.00–0.40)
Total Protein: 6.3 g/dL (ref 6.0–8.5)

## 2017-07-04 LAB — LIPID PANEL W/O CHOL/HDL RATIO
CHOLESTEROL TOTAL: 280 mg/dL — AB (ref 100–199)
HDL: 34 mg/dL — ABNORMAL LOW (ref >39)
Triglycerides: 462 mg/dL — ABNORMAL HIGH (ref 0–149)

## 2017-07-06 ENCOUNTER — Ambulatory Visit: Payer: Self-pay | Admitting: Internal Medicine

## 2017-07-06 ENCOUNTER — Encounter: Payer: Self-pay | Admitting: Internal Medicine

## 2017-07-06 VITALS — BP 158/90 | HR 70 | Resp 12 | Ht 66.25 in | Wt 182.0 lb

## 2017-07-06 DIAGNOSIS — Z794 Long term (current) use of insulin: Secondary | ICD-10-CM

## 2017-07-06 DIAGNOSIS — E782 Mixed hyperlipidemia: Secondary | ICD-10-CM

## 2017-07-06 DIAGNOSIS — E1165 Type 2 diabetes mellitus with hyperglycemia: Secondary | ICD-10-CM

## 2017-07-06 DIAGNOSIS — L03031 Cellulitis of right toe: Secondary | ICD-10-CM

## 2017-07-06 DIAGNOSIS — I10 Essential (primary) hypertension: Secondary | ICD-10-CM

## 2017-07-06 LAB — GLUCOSE, POCT (MANUAL RESULT ENTRY): POC Glucose: 204 mg/dl — AB (ref 70–99)

## 2017-07-06 NOTE — Progress Notes (Signed)
Subjective:    Patient ID: Willie Jordan, male    DOB: September 15, 1965, 51 y.o.   MRN: [id number]  HPI   1.  Right foot, 2nd and 4th toe cellulitis.  He did not tolerate Clindamycin due to diarrhea.  Finished  Levofloxacin 750 mg 4 days ago.  He feels his toes are doing fine without the Clindamycin, the latter of which he took at most 3 days.    2.  Diarrhea/vomiting:  No fever.  His stools are now decreased to twice daily and more solid.  He is not having any abdominal cramping.  No blood in stool.  He did bring in stool to check for C.Diff, but not diarrheal in container  3.  Diabetes:  Has not taken meds as did not eat yet.  4.  Hypertension:  Has not taken meds yet as he did not eat breakfast.     5.  Hyperlipidemia:  Discussed labs from last Friday do not show a significant improvement from when he was taking Pitavastatin--currently should be taking Atorvastatin 40 mg daily.  He admits to stopping the Atorvastatin for at least 1.5 weeks prior to the lab as he felt he was taking too many medications with the antibiotics. Lipid Panel     Component Value Date/Time   CHOL 280 (H) 07/03/2017 0947   TRIG 462 (H) 07/03/2017 0947   HDL 34 (L) 07/03/2017 0947   CHOLHDL 7.6 09/27/2016 1643   VLDL 36 09/27/2016 1643   LDLCALC Comment 07/03/2017 0947   LDLDIRECT  05/25/2007 1135    64 (NOTE) ATP III Classification (LDL):      < 100        mg/dL         Optimal     100 - 129     mg/dL         Near or Above Optimal     130 - 159     mg/dL         Borderline High     160 - 189     mg/dL         High      > 190        mg/dL         Very  High     Current Meds  Medication Sig  . amLODipine (NORVASC) 5 MG tablet Take 1 tablet (5 mg total) by mouth daily.  Marland Kitchen atorvastatin (LIPITOR) 40 MG tablet Take 1 tablet (40 mg total) by mouth daily.  . blood glucose meter kit and supplies Dispense based on patient and insurance preference. Use up to four times daily as directed. (FOR ICD-9 250.00, 250.01).  Marland Kitchen  gabapentin (NEURONTIN) 300 MG capsule Take 1 capsule (300 mg total) by mouth 3 (three) times daily.  . Insulin Lispro Prot & Lispro (HUMALOG MIX 75/25 KWIKPEN) (75-25) 100 UNIT/ML Kwikpen Inject 26 units subcutaneously in the morning and 22 units in the evening before meals (Patient taking differently: Inject 26 units subcutaneously in the morning and 24 units in the evening before meals)  . Insulin Pen Needle 31G X 5 MM MISC Use as directed twice daily  . lisinopril (PRINIVIL,ZESTRIL) 20 MG tablet 1 tab by mouth daily  . lisinopril-hydrochlorothiazide (PRINZIDE,ZESTORETIC) 20-25 MG tablet 1 tab by mouth in morning with other 20 mg Lisinopril  . metFORMIN (GLUCOPHAGE-XR) 500 MG 24 hr tablet 2 tabs by mouth with breakfast daily    Allergies  Allergen Reactions  . Aspirin Rash  Review of Systems     Objective:   Physical Exam  Right 2nd and 4th toes:  Dried skin peeling and adherent to dorsal 4th toe.  Discoloration from beneath nails of the two toes much less noticeable and appears to be old blood at this point.  No erythema or swelling.  Good cap refill.      Assessment & Plan:  1.  Toe cellulitis:  Toes looking well, save for thickened dry skin overlying 4th toe.  Encouraged twice daily washing and at night, application of tea tree oil.  To call if recurs with swelling, redness.  2.  DM:  Has been much better controlled.  Asked him to check sugars at times in the evening before dinner.  3.  Hyperlipidemia:  He was not taking Atorvastatin for 1.5 weeks when last checked.  Before stopping meds in the future, he is to discuss with provider first.  Also to notify nurse at time of lab if he is not taking the med for which we are assessing labs.  4.  Diarrhea and nausea:  Former resolving, latter resolved  5.  Hypertension;  Has not taken meds this morning.  Follow up in 2 months with FLP/hepatic profile and with me 2 days later

## 2017-07-15 ENCOUNTER — Encounter: Payer: Self-pay | Admitting: Internal Medicine

## 2017-07-15 ENCOUNTER — Ambulatory Visit: Payer: Self-pay | Admitting: Internal Medicine

## 2017-07-15 VITALS — BP 136/72 | HR 94 | Temp 100.5°F | Resp 12 | Ht 66.25 in | Wt 185.0 lb

## 2017-07-15 DIAGNOSIS — E1165 Type 2 diabetes mellitus with hyperglycemia: Secondary | ICD-10-CM

## 2017-07-15 DIAGNOSIS — A084 Viral intestinal infection, unspecified: Secondary | ICD-10-CM

## 2017-07-15 DIAGNOSIS — Z794 Long term (current) use of insulin: Secondary | ICD-10-CM

## 2017-07-15 LAB — GLUCOSE, POCT (MANUAL RESULT ENTRY): POC GLUCOSE: 242 mg/dL — AB (ref 70–99)

## 2017-07-15 MED ORDER — ONDANSETRON 4 MG PO TBDP: 4.0000 mg | ORAL_TABLET | Freq: Three times a day (TID) | ORAL | 0 refills | Status: DC | PRN

## 2017-07-15 NOTE — Patient Instructions (Signed)
Clear liquids only Check sugars regularly Take Zofran and then about 20 minutes later start sipping water or Pedialyte

## 2017-07-15 NOTE — Progress Notes (Signed)
   Subjective:    Patient ID: Benecio Kluger, male    DOB: 20-Oct-1965, 52 y.o.   MRN: [id number]  HPI   Started feeling poorly last afternoon at 4 p.m. Developed nausea, vomiting, diarrhea, and myalgias all at once.  Also felt he had a fever and headache.  Last took Tylenol at 2 a.m.  Has vomited x 4---clear water.  Has been able to keep some water today.  Last episode of vomiting was 12 noon. Small loose stools x 7.  No blood.  Last episode 30 minutes ago.   States his urination is fine, clear and not dark.  Last urination was about 30 minutes ago. Has not checked his sugars.  Current Meds  Medication Sig  . amLODipine (NORVASC) 5 MG tablet Take 1 tablet (5 mg total) by mouth daily.  Marland Kitchen atorvastatin (LIPITOR) 40 MG tablet Take 1 tablet (40 mg total) by mouth daily.  . blood glucose meter kit and supplies Dispense based on patient and insurance preference. Use up to four times daily as directed. (FOR ICD-9 250.00, 250.01).  . Cyanocobalamin (VITAMIN B-12 PO) Take 1 tablet by mouth daily.  Marland Kitchen gabapentin (NEURONTIN) 300 MG capsule Take 1 capsule (300 mg total) by mouth 3 (three) times daily.  . Insulin Lispro Prot & Lispro (HUMALOG MIX 75/25 KWIKPEN) (75-25) 100 UNIT/ML Kwikpen Inject 26 units subcutaneously in the morning and 22 units in the evening before meals (Patient taking differently: Inject 26 units subcutaneously in the morning and 24 units in the evening before meals)  . Insulin Pen Needle 31G X 5 MM MISC Use as directed twice daily  . lisinopril (PRINIVIL,ZESTRIL) 20 MG tablet 1 tab by mouth daily  . lisinopril-hydrochlorothiazide (PRINZIDE,ZESTORETIC) 20-25 MG tablet 1 tab by mouth in morning with other 20 mg Lisinopril  . metFORMIN (GLUCOPHAGE-XR) 500 MG 24 hr tablet 2 tabs by mouth with breakfast daily  . Multiple Vitamin (MULTIVITAMIN) tablet Take 1 tablet by mouth daily.   Allergies  Allergen Reactions  . Aspirin Rash        Review of Systems     Objective:   Physical Exam Appears mildly ill HEENT:  PERRL, EOMI, TMs pearly gray, throat without injection.  MMM Neck: Supple, No adenopathy Chest:  CTA CV:  RRR without murmur or rub, radial and DP pulses normal and equal Abd:  S, Mild epigastric tenderness, No rebound or peritoneal signs.  No HSM or mass. + BS LE:  Toes previously infected continue to improve.  No erythema or swelling.       Assessment & Plan:  Viral gastroenteritis:  Discussed he needs to check his sugars regularly.  Looking to keep sugar under 200 and above 70.   Zofran 4 mg every 8 hours--to wait about 20 minutes and then begin sipping Pedialyte, water, or if sugar in good range or a bit low, Gatorade. To call progress report in a.m. Tylenol every 4 - 6 hours for myalgias and fever. To ED if unable to keep oral fluids down.

## 2017-07-17 ENCOUNTER — Emergency Department (HOSPITAL_COMMUNITY): Payer: Self-pay

## 2017-07-17 ENCOUNTER — Encounter (HOSPITAL_COMMUNITY): Payer: Self-pay | Admitting: Emergency Medicine

## 2017-07-17 ENCOUNTER — Inpatient Hospital Stay (HOSPITAL_COMMUNITY)
Admission: EM | Admit: 2017-07-17 | Discharge: 2017-07-27 | DRG: 871 | Disposition: A | Discharge status: Home / Self Care | Payer: Self-pay | Attending: Family Medicine | Admitting: Family Medicine

## 2017-07-17 DIAGNOSIS — R51 Headache: Secondary | ICD-10-CM | POA: Diagnosis not present

## 2017-07-17 DIAGNOSIS — E871 Hypo-osmolality and hyponatremia: Secondary | ICD-10-CM | POA: Diagnosis present

## 2017-07-17 DIAGNOSIS — R112 Nausea with vomiting, unspecified: Secondary | ICD-10-CM | POA: Diagnosis present

## 2017-07-17 DIAGNOSIS — E1121 Type 2 diabetes mellitus with diabetic nephropathy: Secondary | ICD-10-CM | POA: Diagnosis present

## 2017-07-17 DIAGNOSIS — R6520 Severe sepsis without septic shock: Secondary | ICD-10-CM | POA: Diagnosis present

## 2017-07-17 DIAGNOSIS — E1143 Type 2 diabetes mellitus with diabetic autonomic (poly)neuropathy: Secondary | ICD-10-CM | POA: Diagnosis present

## 2017-07-17 DIAGNOSIS — Z87891 Personal history of nicotine dependence: Secondary | ICD-10-CM

## 2017-07-17 DIAGNOSIS — Z9119 Patient's noncompliance with other medical treatment and regimen: Secondary | ICD-10-CM

## 2017-07-17 DIAGNOSIS — E785 Hyperlipidemia, unspecified: Secondary | ICD-10-CM | POA: Diagnosis present

## 2017-07-17 DIAGNOSIS — J209 Acute bronchitis, unspecified: Secondary | ICD-10-CM | POA: Diagnosis present

## 2017-07-17 DIAGNOSIS — E1142 Type 2 diabetes mellitus with diabetic polyneuropathy: Secondary | ICD-10-CM | POA: Diagnosis present

## 2017-07-17 DIAGNOSIS — Z89422 Acquired absence of other left toe(s): Secondary | ICD-10-CM

## 2017-07-17 DIAGNOSIS — J1008 Influenza due to other identified influenza virus with other specified pneumonia: Secondary | ICD-10-CM | POA: Diagnosis present

## 2017-07-17 DIAGNOSIS — A4189 Other specified sepsis: Principal | ICD-10-CM | POA: Diagnosis present

## 2017-07-17 DIAGNOSIS — Z833 Family history of diabetes mellitus: Secondary | ICD-10-CM

## 2017-07-17 DIAGNOSIS — J189 Pneumonia, unspecified organism: Secondary | ICD-10-CM

## 2017-07-17 DIAGNOSIS — Z79899 Other long term (current) drug therapy: Secondary | ICD-10-CM

## 2017-07-17 DIAGNOSIS — E440 Moderate protein-calorie malnutrition: Secondary | ICD-10-CM | POA: Diagnosis present

## 2017-07-17 DIAGNOSIS — E46 Unspecified protein-calorie malnutrition: Secondary | ICD-10-CM | POA: Diagnosis present

## 2017-07-17 DIAGNOSIS — A419 Sepsis, unspecified organism: Secondary | ICD-10-CM | POA: Diagnosis present

## 2017-07-17 DIAGNOSIS — D6959 Other secondary thrombocytopenia: Secondary | ICD-10-CM | POA: Diagnosis present

## 2017-07-17 DIAGNOSIS — N179 Acute kidney failure, unspecified: Secondary | ICD-10-CM | POA: Diagnosis present

## 2017-07-17 DIAGNOSIS — I5033 Acute on chronic diastolic (congestive) heart failure: Secondary | ICD-10-CM | POA: Diagnosis present

## 2017-07-17 DIAGNOSIS — J159 Unspecified bacterial pneumonia: Secondary | ICD-10-CM | POA: Diagnosis present

## 2017-07-17 DIAGNOSIS — E113519 Type 2 diabetes mellitus with proliferative diabetic retinopathy with macular edema, unspecified eye: Secondary | ICD-10-CM | POA: Diagnosis present

## 2017-07-17 DIAGNOSIS — I110 Hypertensive heart disease with heart failure: Secondary | ICD-10-CM | POA: Diagnosis present

## 2017-07-17 DIAGNOSIS — K567 Ileus, unspecified: Secondary | ICD-10-CM

## 2017-07-17 DIAGNOSIS — I10 Essential (primary) hypertension: Secondary | ICD-10-CM | POA: Diagnosis present

## 2017-07-17 DIAGNOSIS — E876 Hypokalemia: Secondary | ICD-10-CM | POA: Diagnosis present

## 2017-07-17 DIAGNOSIS — R109 Unspecified abdominal pain: Secondary | ICD-10-CM | POA: Diagnosis present

## 2017-07-17 DIAGNOSIS — E1165 Type 2 diabetes mellitus with hyperglycemia: Secondary | ICD-10-CM | POA: Diagnosis present

## 2017-07-17 DIAGNOSIS — Z794 Long term (current) use of insulin: Secondary | ICD-10-CM

## 2017-07-17 DIAGNOSIS — D649 Anemia, unspecified: Secondary | ICD-10-CM | POA: Diagnosis present

## 2017-07-17 DIAGNOSIS — K3184 Gastroparesis: Secondary | ICD-10-CM | POA: Diagnosis present

## 2017-07-17 DIAGNOSIS — E860 Dehydration: Secondary | ICD-10-CM | POA: Diagnosis present

## 2017-07-17 DIAGNOSIS — R197 Diarrhea, unspecified: Secondary | ICD-10-CM | POA: Diagnosis present

## 2017-07-17 DIAGNOSIS — G4733 Obstructive sleep apnea (adult) (pediatric): Secondary | ICD-10-CM | POA: Diagnosis present

## 2017-07-17 DIAGNOSIS — K5900 Constipation, unspecified: Secondary | ICD-10-CM | POA: Diagnosis not present

## 2017-07-17 DIAGNOSIS — J9601 Acute respiratory failure with hypoxia: Secondary | ICD-10-CM | POA: Diagnosis present

## 2017-07-17 DIAGNOSIS — E133519 Other specified diabetes mellitus with proliferative diabetic retinopathy with macular edema, unspecified eye: Secondary | ICD-10-CM | POA: Diagnosis present

## 2017-07-17 DIAGNOSIS — E1140 Type 2 diabetes mellitus with diabetic neuropathy, unspecified: Secondary | ICD-10-CM | POA: Diagnosis present

## 2017-07-17 DIAGNOSIS — Z6832 Body mass index (BMI) 32.0-32.9, adult: Secondary | ICD-10-CM

## 2017-07-17 LAB — URINALYSIS, ROUTINE W REFLEX MICROSCOPIC
Bilirubin Urine: NEGATIVE
Glucose, UA: 50 mg/dL — AB
Ketones, ur: NEGATIVE mg/dL
Leukocytes, UA: NEGATIVE
Nitrite: NEGATIVE
Protein, ur: >=300 mg/dL — AB
Specific Gravity, Urine: 1.012 (ref 1.005–1.030)
pH: 6 (ref 5.0–8.0)

## 2017-07-17 LAB — COMPREHENSIVE METABOLIC PANEL
ALT: 37 U/L (ref 17–63)
ANION GAP: 11 (ref 5–15)
AST: 45 U/L — ABNORMAL HIGH (ref 15–41)
Albumin: 2.2 g/dL — ABNORMAL LOW (ref 3.5–5.0)
Alkaline Phosphatase: 63 U/L (ref 38–126)
BUN: 25 mg/dL — ABNORMAL HIGH (ref 6–20)
CO2: 25 mmol/L (ref 22–32)
CREATININE: 1.4 mg/dL — AB (ref 0.61–1.24)
Calcium: 7.4 mg/dL — ABNORMAL LOW (ref 8.9–10.3)
Chloride: 95 mmol/L — ABNORMAL LOW (ref 101–111)
GFR, EST NON AFRICAN AMERICAN: 58 mL/min — AB (ref >60)
Glucose, Bld: 228 mg/dL — ABNORMAL HIGH (ref 65–99)
Potassium: 3.5 mmol/L (ref 3.5–5.1)
Sodium: 131 mmol/L — ABNORMAL LOW (ref 135–145)
Total Bilirubin: 0.9 mg/dL (ref 0.3–1.2)
Total Protein: 5.7 g/dL — ABNORMAL LOW (ref 6.5–8.1)

## 2017-07-17 LAB — CBG MONITORING, ED
GLUCOSE-CAPILLARY: 153 mg/dL — AB (ref 65–99)
GLUCOSE-CAPILLARY: 82 mg/dL (ref 65–99)
Glucose-Capillary: 230 mg/dL — ABNORMAL HIGH (ref 65–99)
Glucose-Capillary: 166 mg/dL — ABNORMAL HIGH (ref 65–99)

## 2017-07-17 LAB — CBC WITH DIFFERENTIAL/PLATELET
BASOS PCT: 0 %
Basophils Absolute: 0 10*3/uL (ref 0.0–0.1)
EOS ABS: 0 10*3/uL (ref 0.0–0.7)
Eosinophils Relative: 0 %
HCT: 35.1 % — ABNORMAL LOW (ref 39.0–52.0)
HEMOGLOBIN: 11.4 g/dL — AB (ref 13.0–17.0)
Lymphocytes Relative: 8 %
Lymphs Abs: 0.7 10*3/uL (ref 0.7–4.0)
MCH: 26.7 pg (ref 26.0–34.0)
MCHC: 32.5 g/dL (ref 30.0–36.0)
MCV: 82.2 fL (ref 78.0–100.0)
MONOS PCT: 6 %
Monocytes Absolute: 0.5 10*3/uL (ref 0.1–1.0)
NEUTROS PCT: 86 %
Neutro Abs: 8 10*3/uL — ABNORMAL HIGH (ref 1.7–7.7)
Platelets: 171 10*3/uL (ref 150–400)
RBC: 4.27 MIL/uL (ref 4.22–5.81)
RDW: 12.2 % (ref 11.5–15.5)
WBC: 9.2 10*3/uL (ref 4.0–10.5)

## 2017-07-17 LAB — LACTIC ACID, PLASMA: LACTIC ACID, VENOUS: 1.1 mmol/L (ref 0.5–1.9)

## 2017-07-17 LAB — I-STAT CG4 LACTIC ACID, ED: Lactic Acid, Venous: 1.17 mmol/L (ref 0.5–1.9)

## 2017-07-17 LAB — HEMOGLOBIN A1C
Hgb A1c MFr Bld: 7 % — ABNORMAL HIGH (ref 4.8–5.6)
Mean Plasma Glucose: 154.2 mg/dL

## 2017-07-17 LAB — BRAIN NATRIURETIC PEPTIDE: B Natriuretic Peptide: 107.3 pg/mL — ABNORMAL HIGH (ref 0.0–100.0)

## 2017-07-17 LAB — PROCALCITONIN: Procalcitonin: 0.25 ng/mL

## 2017-07-17 MED ORDER — POLYETHYLENE GLYCOL 3350 17 G PO PACK: 17.0000 g | PACK | Freq: Every day | ORAL | Status: DC | PRN

## 2017-07-17 MED ORDER — ONDANSETRON HCL 4 MG PO TABS: 4.0000 mg | ORAL_TABLET | Freq: Four times a day (QID) | ORAL | Status: DC | PRN

## 2017-07-17 MED ORDER — ACETAMINOPHEN 650 MG RE SUPP: 650.0000 mg | Freq: Four times a day (QID) | RECTAL | Status: DC | PRN

## 2017-07-17 MED ORDER — INSULIN ASPART 100 UNIT/ML ~~LOC~~ SOLN: 0.0000 [IU] | Freq: Every day | SUBCUTANEOUS | Status: DC

## 2017-07-17 MED ORDER — DEXTROSE 5 % IV SOLN
500.0000 mg | INTRAVENOUS | Status: DC
  Administered 2017-07-18 – 2017-07-19 (×2): 500 mg via INTRAVENOUS
  Filled 2017-07-17 (×2): qty 500

## 2017-07-17 MED ORDER — DEXTROSE 5 % IV SOLN
500.0000 mg | Freq: Once | INTRAVENOUS | Status: AC
  Administered 2017-07-17: 500 mg via INTRAVENOUS
  Filled 2017-07-17: qty 500

## 2017-07-17 MED ORDER — SODIUM CHLORIDE 0.9% FLUSH
3.0000 mL | Freq: Two times a day (BID) | INTRAVENOUS | Status: DC
  Administered 2017-07-18 – 2017-07-26 (×18): 3 mL via INTRAVENOUS

## 2017-07-17 MED ORDER — DEXTROSE 5 % IV SOLN
1.0000 g | INTRAVENOUS | Status: DC
  Administered 2017-07-18 – 2017-07-20 (×3): 1 g via INTRAVENOUS
  Filled 2017-07-17 (×3): qty 10

## 2017-07-17 MED ORDER — SODIUM CHLORIDE 0.9 % IV BOLUS (SEPSIS)
1000.0000 mL | Freq: Once | INTRAVENOUS | Status: AC
  Administered 2017-07-17: 1000 mL via INTRAVENOUS

## 2017-07-17 MED ORDER — INSULIN ASPART PROT & ASPART (70-30 MIX) 100 UNIT/ML ~~LOC~~ SUSP
22.0000 [IU] | Freq: Two times a day (BID) | SUBCUTANEOUS | Status: DC
  Administered 2017-07-18: 22 [IU] via SUBCUTANEOUS
  Filled 2017-07-17: qty 10

## 2017-07-17 MED ORDER — ATORVASTATIN CALCIUM 40 MG PO TABS
40.0000 mg | ORAL_TABLET | Freq: Every day | ORAL | Status: DC
  Administered 2017-07-17 – 2017-07-27 (×11): 40 mg via ORAL
  Filled 2017-07-17 (×12): qty 1

## 2017-07-17 MED ORDER — ACETAMINOPHEN 325 MG PO TABS
650.0000 mg | ORAL_TABLET | Freq: Four times a day (QID) | ORAL | Status: DC | PRN
  Administered 2017-07-17 – 2017-07-19 (×3): 650 mg via ORAL
  Filled 2017-07-17 (×3): qty 2

## 2017-07-17 MED ORDER — INSULIN ASPART 100 UNIT/ML ~~LOC~~ SOLN
3.0000 [IU] | Freq: Three times a day (TID) | SUBCUTANEOUS | Status: DC
  Administered 2017-07-17 – 2017-07-18 (×4): 3 [IU] via SUBCUTANEOUS
  Filled 2017-07-17 (×2): qty 1

## 2017-07-17 MED ORDER — POTASSIUM CHLORIDE IN NACL 20-0.9 MEQ/L-% IV SOLN
INTRAVENOUS | Status: DC
  Administered 2017-07-17 – 2017-07-18 (×3): via INTRAVENOUS
  Filled 2017-07-17 (×3): qty 1000

## 2017-07-17 MED ORDER — CEFTRIAXONE SODIUM 1 G IJ SOLR
1.0000 g | Freq: Once | INTRAMUSCULAR | Status: AC
  Administered 2017-07-17: 1 g via INTRAVENOUS
  Filled 2017-07-17: qty 10

## 2017-07-17 MED ORDER — ONDANSETRON HCL 4 MG/2ML IJ SOLN
4.0000 mg | Freq: Four times a day (QID) | INTRAMUSCULAR | Status: DC | PRN
  Administered 2017-07-19: 4 mg via INTRAVENOUS
  Filled 2017-07-17: qty 2

## 2017-07-17 MED ORDER — HYDROCHLOROTHIAZIDE 25 MG PO TABS
25.0000 mg | ORAL_TABLET | Freq: Every day | ORAL | Status: DC
  Administered 2017-07-17: 25 mg via ORAL
  Filled 2017-07-17: qty 1

## 2017-07-17 MED ORDER — LISINOPRIL 20 MG PO TABS
20.0000 mg | ORAL_TABLET | Freq: Every day | ORAL | Status: DC
  Administered 2017-07-17: 20 mg via ORAL
  Filled 2017-07-17: qty 1

## 2017-07-17 MED ORDER — INSULIN ASPART 100 UNIT/ML ~~LOC~~ SOLN
0.0000 [IU] | Freq: Three times a day (TID) | SUBCUTANEOUS | Status: DC
  Administered 2017-07-17: 5 [IU] via SUBCUTANEOUS
  Administered 2017-07-18: 11 [IU] via SUBCUTANEOUS
  Administered 2017-07-18: 15 [IU] via SUBCUTANEOUS
  Filled 2017-07-17: qty 1

## 2017-07-17 MED ORDER — SODIUM CHLORIDE 0.9 % IV SOLN
1000.0000 mL | INTRAVENOUS | Status: DC
  Administered 2017-07-17: 1000 mL via INTRAVENOUS

## 2017-07-17 MED ORDER — GABAPENTIN 300 MG PO CAPS
300.0000 mg | ORAL_CAPSULE | Freq: Three times a day (TID) | ORAL | Status: DC
  Administered 2017-07-17 – 2017-07-27 (×31): 300 mg via ORAL
  Filled 2017-07-17 (×31): qty 1

## 2017-07-17 MED ORDER — AMLODIPINE BESYLATE 5 MG PO TABS
5.0000 mg | ORAL_TABLET | Freq: Every day | ORAL | Status: DC
  Administered 2017-07-17 – 2017-07-27 (×11): 5 mg via ORAL
  Filled 2017-07-17 (×11): qty 1

## 2017-07-17 MED ORDER — IBUPROFEN 800 MG PO TABS
800.0000 mg | ORAL_TABLET | Freq: Four times a day (QID) | ORAL | Status: DC | PRN
  Administered 2017-07-17: 800 mg via ORAL
  Filled 2017-07-17: qty 1

## 2017-07-17 MED ORDER — LISINOPRIL-HYDROCHLOROTHIAZIDE 20-25 MG PO TABS: 1.0000 | ORAL_TABLET | Freq: Every day | ORAL | Status: DC

## 2017-07-17 MED ORDER — ENOXAPARIN SODIUM 40 MG/0.4ML ~~LOC~~ SOLN
40.0000 mg | SUBCUTANEOUS | Status: DC
  Administered 2017-07-17 – 2017-07-26 (×10): 40 mg via SUBCUTANEOUS
  Filled 2017-07-17 (×10): qty 0.4

## 2017-07-17 MED ORDER — ACETAMINOPHEN 500 MG PO TABS
1000.0000 mg | ORAL_TABLET | Freq: Once | ORAL | Status: AC
  Administered 2017-07-17: 1000 mg via ORAL
  Filled 2017-07-17: qty 2

## 2017-07-17 MED ORDER — LISINOPRIL 20 MG PO TABS: 20.0000 mg | ORAL_TABLET | Freq: Every day | ORAL | Status: DC

## 2017-07-17 NOTE — ED Triage Notes (Signed)
Pt c/o gen abd pain radiating to his back since Tuesday with n/v/d. Also reports prod cough. Saw his pcp on wed and was given meds. Pt a/ox4 resp e/u. Found to be 60%O2 on room air in waiting room. Pt up to 98% on nrb. nad.

## 2017-07-17 NOTE — ED Notes (Signed)
CBG 82 

## 2017-07-17 NOTE — ED Notes (Signed)
Carb modified lunch tray ordered @ 1036

## 2017-07-17 NOTE — ED Notes (Signed)
Pt ambulatory to restroom

## 2017-07-17 NOTE — ED Notes (Addendum)
Patient is tolerating 6lit of [place]  O2 is 93%

## 2017-07-17 NOTE — ED Notes (Signed)
Dinner tray ordered.

## 2017-07-17 NOTE — H&P (Signed)
History and Physical    Willie Jordan QQI:[id number] DOB: [birth year] DOA: 07/17/2017  PCP: Mack Hook, MD patient is admitted unassigned as [facility] Patient coming from: Home  I have personally briefly reviewed patient's old medical records in [place]  Chief Complaint: Nausea vomiting and fever since Tuesday with associated pain in the back when he takes a deep breath  HPI: Willie Jordan is a 52 y.o. 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  He has also developed fevers and chills, feels as though he is having pain around his rib cage.  This gets worse with breathing and position, nothing seems to make it better despite taking Tylenol, he does have associated chronic edema of his lower extremities bilaterally.  He denies any coughing, he states that his abdominal pain is bilateral lower abdomen and lower pelvis.  No dysuria, frequency or oliguria. He has had an associated cough but no sputum production.  He saw Mack Hook at the free clinic on Tuesday who recommended he continue taking Tylenol and felt that he had a viral syndrome.  However over the past several days his symptoms has gotten worse.  He is unable to hold anything down and on the occasions that he does not vomit he has immediate diarrhea after eating.  The pain in his rib cage is spread to his back and it hurts when he takes a deep breath.  On presentation to the emergency department he is febrile to 102.9, hypoxic to 74% on room air,  breathing at 25 times a minute, and appears very ill.  ED Course: Rapidly assessed and given IV fluids for sepsis protocol, he was started on antibiotics for community-acquired pneumonia to include ceftriaxone and azithromycin, his creatinine was noted elevated and a CT scan of the abdomen and pelvis was not ordered due to concerns about his renal function.  A chest x-ray did show right upper lobe and patchy left basilar multifocal multi lobar pneumonia.  His albumin was noted to be at 2.2 and markedly low with associated chronic anemia etiology of which is unclear.  Patient will be admitted into stepdown unit for management of acute severe sepsis due to multifocal multilateral pneumonia with associated hypoxemia, fever, and hyponatremia.  Review of Systems: As per HPI otherwise 10 point review of systems negative.   Past Medical History:  Diagnosis Date  . Diabetic neuropathy ([place]) 03/17/2017  . Hyperlipidemia   . Hypertension   . Microalbuminuria due to type 2 diabetes mellitus ([place]) 01/30/2017  . Neuropathy   . Onychomycosis of toenail 01/30/2017  . Peripheral neuropathy 03/17/2017  . Type 2 diabetes mellitus, uncontrolled ([place])     Past Surgical History:  Procedure Laterality Date  . AMPUTATION Left 09/29/2016   Procedure: 2nd RAY AMPUTATION LEFT FOOT I&D LEFT FOOT;  Surgeon: Edrick Kins, DPM;  Location: [facility];  Service: Podiatry;  Laterality: Left;  . APPENDECTOMY       reports that he quit smoking about 18 months ago. His smoking use included cigarettes. He  started smoking about 20 months ago. he has never used smokeless tobacco. He reports that he does not drink alcohol or use drugs.  Allergies  Allergen Reactions  . Aspirin Rash    Family History  Problem Relation Age of Onset  . Diabetes Mother   . Diabetes Sister   . Diabetes Brother   . Cancer Maternal Grandmother      Prior to Admission medications   Medication Sig Start Date End Date Taking? Authorizing Provider    acetaminophen (TYLENOL) 500 MG tablet Take 1,000 mg by mouth every 6 (six) hours as needed for moderate pain.   Yes [provider]  amLODipine (NORVASC) 5 MG tablet Take 1 tablet (5 mg total) by mouth daily. 02/10/17  Yes Mack Hook, MD  atorvastatin (LIPITOR) 40 MG tablet Take 1 tablet (40 mg total) by mouth daily. 06/03/17  Yes Mack Hook, MD  gabapentin (NEURONTIN) 300 MG capsule Take 1 capsule (300 mg total) by mouth 3 (three) times daily. 11/28/16  Yes Mack Hook, MD  Insulin Lispro Prot & Lispro (HUMALOG MIX 75/25 KWIKPEN) (75-25) 100 UNIT/ML Kwikpen Inject 26 units subcutaneously in the morning and 22 units in the evening before meals Patient taking differently: Inject 26 units subcutaneously in the morning and 24 units in the evening before meals 11/28/16  Yes Mack Hook, MD  lisinopril (PRINIVIL,ZESTRIL) 20 MG tablet 1 tab by mouth daily 01/30/17  Yes Mack Hook, MD  lisinopril-hydrochlorothiazide (PRINZIDE,ZESTORETIC) 20-25 MG tablet 1 tab by mouth in morning with other 20 mg Lisinopril 03/17/17  Yes Mack Hook, MD  metFORMIN (GLUCOPHAGE-XR) 500 MG 24 hr tablet 2 tabs by mouth with breakfast daily 11/28/16  Yes Mack Hook, MD  ondansetron (ZOFRAN ODT) 4 MG disintegrating tablet Take 1 tablet (4 mg total) by mouth every 8 (eight) hours as needed for nausea or vomiting. 07/15/17  Yes Mack Hook, MD  blood glucose meter kit and supplies Dispense based on patient and insurance preference. Use up to four times daily as directed. (FOR ICD-9 250.00, 250.01). 10/05/16   Patrecia Pour, MD  Insulin Pen Needle 31G X 5 MM MISC Use as directed twice daily 01/12/17   Mack Hook, MD  levofloxacin (LEVAQUIN) 750 MG tablet Take 1 tablet (750 mg total) by mouth daily. Patient not taking: Reported on 07/06/2017 06/23/17   Mack Hook, MD  terbinafine (LAMISIL) 250 MG tablet Take 1 tablet (250 mg total) by mouth daily. For 90  days Patient not taking: Reported on 06/23/2017 01/30/17   Mack Hook, MD    Physical Exam: Vitals:   07/17/17 0815 07/17/17 0830 07/17/17 0916 07/17/17 0918  BP: 130/67 133/74 131/68   Pulse:   83   Resp:   (!) 25   Temp:   99.5 F (37.5 C) 99.5 F (37.5 C)  TempSrc:   Oral Oral  SpO2:   94%   Weight:      Height:       .TCS Constitutional: Acute respiratory distress with hypoxemia and increased accessory muscle use on 100% nonrebreather oxygen Vitals:   07/17/17 0815 07/17/17 0830 07/17/17 0916 07/17/17 0918  BP: 130/67 133/74 131/68   Pulse:   83   Resp:   (!) 25   Temp:   99.5 F (37.5 C) 99.5 F (37.5 C)  TempSrc:   Oral Oral  SpO2:   94%   Weight:      Height:       Eyes: PERRL, lids and conjunctivae normal ENMT: Mucous membranes  are dry. Posterior pharynx clear of any exudate or lesions.Normal dentition.  Neck: normal, supple, no masses, no thyromegaly Respiratory: Decreased breath sounds bilaterally greater on the right than on the left with rhonchi throughout and associated rales without any wheezing.  Markedly increased respiratory effort.  Significant accessory muscle use.  Cardiovascular: Tachycardic rate and rhythm, no murmurs / rubs / gallops.  Chronic 2+ extremity edema. 2+ pedal pulses. No carotid bruits.  Abdomen: Mild diffuse tenderness, no masses palpated. No hepatosplenomegaly. Bowel sounds positive and hyperactive.  Musculoskeletal: no clubbing / cyanosis. No joint deformity upper and lower extremities. Good ROM, no contractures. Normal muscle tone.  Skin: no rashes, lesions, ulcers. No induration Neurologic: CN 2-12 grossly intact. Sensation intact, DTR normal. Strength 5/5 in all 4.  Psychiatric: Normal judgment and insight. Alert and oriented x 3. Normal mood.     Labs on Admission: I have personally reviewed following labs and imaging studies  CBC: Recent Labs  Lab 07/17/17 0711  WBC 9.2  NEUTROABS 8.0*  HGB 11.4*  HCT 35.1*    MCV 82.2  PLT 389   Basic Metabolic Panel: Recent Labs  Lab 07/17/17 0711  NA 131*  K 3.5  CL 95*  CO2 25  GLUCOSE 228*  BUN 25*  CREATININE 1.40*  CALCIUM 7.4*   GFR: Estimated Creatinine Clearance: 64 mL/min (A) (by C-G formula based on SCr of 1.4 mg/dL (H)). Liver Function Tests: Recent Labs  Lab 07/17/17 0711  AST 45*  ALT 37  ALKPHOS 63  BILITOT 0.9  PROT 5.7*  ALBUMIN 2.2*   No results for input(s): LIPASE, AMYLASE in the last 168 hours. No results for input(s): AMMONIA in the last 168 hours. Coagulation Profile: No results for input(s): INR, PROTIME in the last 168 hours. Cardiac Enzymes: No results for input(s): CKTOTAL, CKMB, CKMBINDEX, TROPONINI in the last 168 hours. BNP (last 3 results) No results for input(s): PROBNP in the last 8760 hours. HbA1C: No results for input(s): HGBA1C in the last 72 hours. CBG: No results for input(s): GLUCAP in the last 168 hours. Lipid Profile: No results for input(s): CHOL, HDL, LDLCALC, TRIG, CHOLHDL, LDLDIRECT in the last 72 hours. Thyroid Function Tests: No results for input(s): TSH, T4TOTAL, FREET4, T3FREE, THYROIDAB in the last 72 hours. Anemia Panel: No results for input(s): VITAMINB12, FOLATE, FERRITIN, TIBC, IRON, RETICCTPCT in the last 72 hours. Urine analysis:    Component Value Date/Time   COLORURINE AMBER (A) 09/27/2016 1143   APPEARANCEUR HAZY (A) 09/27/2016 1143   LABSPEC 1.016 09/27/2016 1143   PHURINE 5.0 09/27/2016 1143   GLUCOSEU 50 (A) 09/27/2016 1143   HGBUR NEGATIVE 09/27/2016 1143   BILIRUBINUR NEGATIVE 09/27/2016 1143   BILIRUBINUR Negative 12/31/2015 1310   KETONESUR NEGATIVE 09/27/2016 1143   PROTEINUR 100 (A) 09/27/2016 1143   UROBILINOGEN 0.2 12/31/2015 1310   UROBILINOGEN 4.0 (H) 05/29/2007 1609   NITRITE NEGATIVE 09/27/2016 1143   LEUKOCYTESUR NEGATIVE 09/27/2016 1143    Radiological Exams on Admission: Dg Chest Port 1 View  Result Date: 07/17/2017 CLINICAL DATA:  Cough and  abdominal pain EXAM: PORTABLE CHEST 1 VIEW COMPARISON:  September 27, 2016 FINDINGS: There is patchy airspace consolidation in the left base. There is also patchy airspace consolidation in the right upper lobe. Lungs elsewhere are clear. Heart is upper normal in size with pulmonary vascularity within normal limits. No adenopathy. No bone lesions. There is aortic atherosclerosis. IMPRESSION: New patchy opacity left base and right upper lobe. Suspect multifocal pneumonia. Stable cardiac silhouette.  There is aortic atherosclerosis. Aortic Atherosclerosis (ICD10-I70.0). Electronically Signed   By: Lowella Grip III M.D.   On: 07/17/2017 08:01    EKG: Independently reviewed.  Sinus rhythm at 93 bpm  Assessment/Plan Principal Problem:   Severe sepsis (HCC) Active Problems:   Community acquired bacterial pneumonia   Abdominal pain   AKI (acute kidney injury) ([place])   Acute respiratory failure with hypoxemia (HCC)   Type 2 diabetes mellitus with hyperglycemia (HCC)   Nausea and vomiting   Hyponatremia   HLD (hyperlipidemia)   Essential hypertension   Diabetic neuropathy ([place])   Retinopathy due to secondary diabetes mellitus, with macular edema, with proliferative retinopathy ([place])   1.  Severe sepsis: Patient presents febrile tachycardic with renal dysfunction and signs and symptoms consistent with severe sepsis likely due to community-acquired pneumonia.  The patient will be admitted into the hospital and treated for sepsis with fluid resuscitation and antibiotics.  We will place him in stepdown care for close monitoring.  I am concerned that the patient will worsen with regards to his respiratory status prior to getting better as he requires significant fluid resuscitation and already is having issues with volume given his malnutrition and peripheral edema..  2.  Community-acquired pneumonia with associated acute respiratory failure with hypoxemia: We will treat patient with 100% nonrebreather  oxygen and antibiotics for community-acquired pneumonia consisting of azithromycin and ceftriaxone.  Patient will be monitored very closely in the stepdown unit.  I am concerned that his pneumonia will "blossom" and worsen prior to getting better.  I have discussed this with the patient and his wife.  3.  Abdominal pain with associated nausea and vomiting: Concerning for associated gastroenteritis.  However given location of pneumonia I suspect that this is more associated with irritation and inflammation from the pneumonia on the diaphragm affecting the intestines and bowels.  I expect by tomorrow morning his creatinine should have improved and have ordered a CT scan of the abdomen and pelvis with contrast for tomorrow morning.  If patient has persistent diarrhea that does not improve with treatment of his pneumonia would recommend a viral GI panel.  4.  Acute kidney injury: Patient with a history of noncompliance and poorly controlled diabetes.  I am concerned that he has underlying renal disease.  He also has proteinuria which is concerning for development of diabetic renal disease.  I will continue the patient on lisinopril.  5.  Type 2 diabetes mellitus with hyperglycemia: Very severe type 2 diabetes with nephropathy, neuropathy, and ophthalmopathy.  We will continue diabetes management in the hospital per sliding scale insulin basal glucose management protocol  6.  Proteinuria with associated malnutrition: Patient may benefit from an outpatient renal evaluation.  At this point we will continue ACE inhibitor.  7.  Hyponatremia: Due to acute pneumonia should improve in the next 24-48 hours.  8.  Hyperlipidemia: Continue outpatient treatment.  9.  Essential hypertension: Continue home medications.  10.  Diabetic neuropathy: Continue gabapentin.  11.  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

## 2017-07-17 NOTE — ED Notes (Signed)
Pt requesting medicine for diarrhea

## 2017-07-17 NOTE — ED Provider Notes (Signed)
[facility] DEPART[facility] Provider Note   CSN: [id number] Arrival date & time: 07/17/17  9798     History   Chief Complaint Chief Complaint  Patient presents with  . Abdominal Pain    HPI Willie Jordan is a 52 y.o. male.  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  He has also developed fevers and chills, feels as though he is having pain around his rib cage.  This gets worse with breathing and position, nothing seems to make it better despite taking Tylenol, he does have associated chronic edema of his lower extremities bilaterally.  He denies any coughing, he states that his abdominal pain is bilateral lower abdomen and lower pelvis.  No dysuria, intermittent diarrhea.  Past Medical History:  Diagnosis Date  . Diabetic neuropathy ([place]) 03/17/2017  . Hyperlipidemia   . Hypertension   . Microalbuminuria due to type 2 diabetes mellitus ([place]) 01/30/2017  . Neuropathy   . Onychomycosis of toenail 01/30/2017  . Peripheral neuropathy 03/17/2017  . Type 2 diabetes mellitus, uncontrolled ([place])     Patient Active Problem List   Diagnosis Date Noted  . Retinopathy due to secondary diabetes mellitus, with macular edema, with proliferative retinopathy ([place]) 04/16/2017  . Peripheral neuropathy 03/17/2017  . Diabetic neuropathy ([place]) 03/17/2017  . Microalbuminuria due to type 2 diabetes mellitus ([place]) 01/30/2017  . Onychomycosis of toenail 01/30/2017  . Diabetic foot infection ([place]) 09/27/2016  . Sepsis ([place]) 09/27/2016  . Diabetes mellitus ([place])   . Pyogenic inflammation of bone ([place])   . Cellulitis of left foot  09/25/2016  . Noncompliance 09/24/2016  . HLD (hyperlipidemia) 09/30/2014  . Essential hypertension 09/30/2014  . Type 2 diabetes mellitus with hyperglycemia ([place]) 09/30/2014    Past Surgical History:  Procedure Laterality Date  . AMPUTATION Left 09/29/2016   Procedure: 2nd RAY AMPUTATION LEFT FOOT I&D LEFT FOOT;  Surgeon: Edrick Kins, DPM;  Location: [facility];  Service: Podiatry;  Laterality: Left;  . APPENDECTOMY         Home Medications    Prior to Admission medications   Medication Sig Start Date End Date Taking? Authorizing Provider  amLODipine (NORVASC) 5 MG tablet Take 1 tablet (5 mg total) by mouth daily. 02/10/17  Yes Mack Hook, MD  lisinopril (PRINIVIL,ZESTRIL) 20 MG tablet 1 tab by mouth daily 01/30/17  Yes Mack Hook, MD  lisinopril-hydrochlorothiazide (PRINZIDE,ZESTORETIC) 20-25 MG tablet 1 tab by mouth in morning with other 20 mg Lisinopril 03/17/17  Yes Mack Hook, MD  atorvastatin (LIPITOR) 40 MG tablet Take 1 tablet (40 mg total) by mouth daily. 06/03/17   Mack Hook, MD  blood glucose meter kit and supplies Dispense based on patient and insurance preference. Use up to four times daily as directed. (FOR ICD-9 250.00, 250.01). 10/05/16   Patrecia Pour, MD  Cyanocobalamin (VITAMIN B-12 PO) Take 1 tablet by mouth daily.    [provider]  gabapentin (NEURONTIN) 300 MG capsule Take 1 capsule (300 mg total) by mouth 3 (three) times daily. 11/28/16   Mack Hook, MD  Insulin Lispro Prot & Lispro (HUMALOG MIX  75/25 KWIKPEN) (75-25) 100 UNIT/ML Kwikpen Inject 26 units subcutaneously in the morning and 22 units in the evening before meals Patient taking differently: Inject 26 units subcutaneously in the morning and 24 units in the evening before meals 11/28/16   Mack Hook, MD  Insulin Pen Needle 31G X 5 MM MISC Use as directed twice daily 01/12/17   Mack Hook, MD  levofloxacin (LEVAQUIN) 750 MG tablet Take 1 tablet  (750 mg total) by mouth daily. Patient not taking: Reported on 07/06/2017 06/23/17   Mack Hook, MD  metFORMIN (GLUCOPHAGE-XR) 500 MG 24 hr tablet 2 tabs by mouth with breakfast daily 11/28/16   Mack Hook, MD  Multiple Vitamin (MULTIVITAMIN) tablet Take 1 tablet by mouth daily.    [provider]  ondansetron (ZOFRAN ODT) 4 MG disintegrating tablet Take 1 tablet (4 mg total) by mouth every 8 (eight) hours as needed for nausea or vomiting. 07/15/17   Mack Hook, MD  terbinafine (LAMISIL) 250 MG tablet Take 1 tablet (250 mg total) by mouth daily. For 90 days Patient not taking: Reported on 06/23/2017 01/30/17   Mack Hook, MD    Family History Family History  Problem Relation Age of Onset  . Diabetes Mother   . Diabetes Sister   . Diabetes Brother   . Cancer Maternal Grandmother     Social History Social History   Tobacco Use  . Smoking status: Former Smoker    Types: Cigarettes    Start date: 10/31/2015    Last attempt to quit: 01/05/2016    Years since quitting: 1.5  . Smokeless tobacco: Never Used  Substance Use Topics  . Alcohol use: No    Alcohol/week: 0.0 oz    Comment: rarely  . Drug use: No     Allergies   Aspirin   Review of Systems Review of Systems  All other systems reviewed and are negative.    Physical Exam Updated Vital Signs BP 131/68   Pulse 83   Temp 99.5 F (37.5 C) (Oral)   Resp (!) 25   Ht _0  (1.676 m)   Wt 81.6 kg (180 lb)   SpO2 94%   BMI 29.05 kg/m   Physical Exam  Constitutional: He appears well-developed and well-nourished. He appears ill. No distress.  HENT:  Head: Normocephalic and atraumatic.  Mouth/Throat: Oropharynx is clear and moist. No oropharyngeal exudate.  Eyes: Conjunctivae and EOM are normal. Pupils are equal, round, and reactive to light. Right eye exhibits no discharge. Left eye exhibits no discharge. No scleral icterus.  Neck: Normal range of motion. Neck supple. No JVD  present. No thyromegaly present.  Cardiovascular: Regular rhythm, normal heart sounds and intact distal pulses. Exam reveals no gallop and no friction rub.  No murmur heard. Borderline tachycardia  Pulmonary/Chest: Effort normal. No respiratory distress. He has no wheezes. He has rales.  Mild tachypnea, bilateral rales, significant hypoxia  Abdominal: Soft. Bowel sounds are normal. He exhibits no distension and no mass. There is no tenderness.  No abdominal tenderness or guarding, prior scar just right of midline in the lower abdomen, patient states from prior appendicitis as a child  Musculoskeletal: Normal range of motion. He exhibits edema ( Symmetrical 2+ pitting edema of the lower extremities below the knee). He exhibits no tenderness.  Lymphadenopathy:    He has no cervical adenopathy.  Neurological: He is alert. Coordination normal.  Patient is able to communicate well, follows commands without difficulty, has normal mentation  Skin: Skin is warm  and dry. No rash noted. No erythema.  Psychiatric: He has a normal mood and affect. His behavior is normal.  Nursing note and vitals reviewed.    ED Treatments / Results  Labs (all labs ordered are listed, but only abnormal results are displayed) Labs Reviewed  COMPREHENSIVE METABOLIC PANEL - Abnormal; Notable for the following components:      Result Value   Sodium 131 (*)    Chloride 95 (*)    Glucose, Bld 228 (*)    BUN 25 (*)    Creatinine, Ser 1.40 (*)    Calcium 7.4 (*)    Total Protein 5.7 (*)    Albumin 2.2 (*)    AST 45 (*)    GFR calc non Af Amer 58 (*)    All other components within normal limits  CBC WITH DIFFERENTIAL/PLATELET - Abnormal; Notable for the following components:   Hemoglobin 11.4 (*)    HCT 35.1 (*)    Neutro Abs 8.0 (*)    All other components within normal limits  BRAIN NATRIURETIC PEPTIDE - Abnormal; Notable for the following components:   B Natriuretic Peptide 107.3 (*)    All other components  within normal limits  CULTURE, BLOOD (ROUTINE X 2)  CULTURE, BLOOD (ROUTINE X 2)  URINE CULTURE  URINALYSIS, ROUTINE W REFLEX MICROSCOPIC  I-STAT CG4 LACTIC ACID, ED  I-STAT CG4 LACTIC ACID, ED    EKG  EKG Interpretation  Date/Time:  Friday July 17 2017 07:12:38 EST Ventricular Rate:  93 PR Interval:    QRS Duration: 95 QT Interval:  358 QTC Calculation: 446 R Axis:   3 Text Interpretation:  Sinus rhythm since last tracing no significant change Confirmed by Noemi Chapel [phone]) on 07/17/2017 7:19:45 AM       Radiology Dg Chest Port 1 View  Result Date: 07/17/2017 CLINICAL DATA:  Cough and abdominal pain EXAM: PORTABLE CHEST 1 VIEW COMPARISON:  September 27, 2016 FINDINGS: There is patchy airspace consolidation in the left base. There is also patchy airspace consolidation in the right upper lobe. Lungs elsewhere are clear. Heart is upper normal in size with pulmonary vascularity within normal limits. No adenopathy. No bone lesions. There is aortic atherosclerosis. IMPRESSION: New patchy opacity left base and right upper lobe. Suspect multifocal pneumonia. Stable cardiac silhouette. There is aortic atherosclerosis. Aortic Atherosclerosis (ICD10-I70.0). Electronically Signed   By: Lowella Grip III M.D.   On: 07/17/2017 08:01    Procedures .Critical Care Performed by: Noemi Chapel, MD Authorized by: Noemi Chapel, MD   Critical care provider statement:    Critical care time (minutes):  35   Critical care time was exclusive of:  Separately billable procedures and treating other patients   Critical care was necessary to treat or prevent imminent or life-threatening deterioration of the following conditions:  Sepsis   Critical care was time spent personally by me on the following activities:  Ordering and performing treatments and interventions, ordering and review of laboratory studies, ordering and review of radiographic studies, pulse oximetry, re-evaluation of patient's  condition, review of old charts, blood draw for specimens, development of treatment plan with patient or surrogate, discussions with consultants, evaluation of patient's response to treatment, examination of patient and obtaining history from patient or surrogate   (including critical care time)  Medications Ordered in ED Medications  0.9 %  sodium chloride infusion (1,000 mLs Intravenous New Bag/Given 07/17/17 0731)  cefTRIAXone (ROCEPHIN) 1 g in dextrose 5 % 50 mL IVPB (not administered)  azithromycin (ZITHROMAX) 500 mg in dextrose 5 % 250 mL IVPB (not administered)  sodium chloride 0.9 % bolus 1,000 mL (not administered)  cefTRIAXone (ROCEPHIN) 1 g in dextrose 5 % 50 mL IVPB (0 g Intravenous Stopped 07/17/17 0813)  azithromycin (ZITHROMAX) 500 mg in dextrose 5 % 250 mL IVPB (500 mg Intravenous New Bag/Given 07/17/17 0813)  acetaminophen (TYLENOL) tablet 1,000 mg (1,000 mg Oral Given 07/17/17 0731)     Initial Impression / Assessment and Plan / ED Course  I have reviewed the triage vital signs and the nursing notes.  Pertinent labs & imaging results that were available during my care of the patient were reviewed by me and considered in my medical decision making (see chart for details).  Clinical Course as of Jul 17 934  Fri Jul 17, 2017  0819 AST: (!) 45 [BM]    Clinical Course User Index [BM] Noemi Chapel, MD    Fever to 102.9, rales and hypoxia suggestive of a pneumonia or infiltrative process, would also consider pulmonary embolism though the fever seems to be too high for that.  I question the etiology of the abdominal pain in the presence of his hypoxia, the patient will likely need to have CT scans of the chest abdomen and pelvis to rule out other etiology and characterize his pulmonary lesions.  Will start with Tylenol, IV fluids, treat as sepsis though the patient is not in shock at this time.  The patient is in agreement with this plan and appears critically ill.  The patient  has ongoing fever and hypoxia suggestive of a sepsis syndrome, his x-ray shows multifocal pneumonia and his kidney function has decreased showing an acute kidney injury.  This is either sepsis or even developing severe sepsis though with his underlying diabetes it is unclear whether his renal function is a process related to his acute illness.  He has been given some IV fluids though he is not in shock, he does not have an elevated lactic acid.  I discussed his care with the hospitalist at approximately 9:30 AM and they will come to admit the patient to the hospital.  He was given antibiotics immediately upon his arrival given his clinical picture of a sepsis syndrome.  Repeat exam the patient is persistently hypoxic requiring nonrebreather at 10 L and despite that is able to speak in moderate to full sentences.  He has improved slightly  Final Clinical Impressions(s) / ED Diagnoses   Final diagnoses:  Sepsis, due to unspecified organism [facility])  AKI (acute kidney injury) [facility])  Multifocal pneumonia    ED Discharge Orders    None       Noemi Chapel, MD 07/17/17 [phone]

## 2017-07-17 NOTE — ED Notes (Signed)
Patient out of bed, hurriedly ambulated to the bathroom

## 2017-07-17 NOTE — ED Notes (Signed)
Patient denies pain and is resting comfortably.  

## 2017-07-17 NOTE — ED Notes (Signed)
Patient ate all his food

## 2017-07-17 NOTE — ED Notes (Signed)
CBG 153 

## 2017-07-17 NOTE — ED Notes (Signed)
ED Provider at bedside. 

## 2017-07-17 NOTE — ED Notes (Signed)
Pt is maintaining spo2 at 93% on 4L [place]

## 2017-07-17 NOTE — ED Notes (Signed)
Patient is placed on 6lit [place] while he is eating

## 2017-07-18 DIAGNOSIS — N179 Acute kidney failure, unspecified: Secondary | ICD-10-CM

## 2017-07-18 DIAGNOSIS — E782 Mixed hyperlipidemia: Secondary | ICD-10-CM

## 2017-07-18 DIAGNOSIS — J189 Pneumonia, unspecified organism: Secondary | ICD-10-CM

## 2017-07-18 DIAGNOSIS — I10 Essential (primary) hypertension: Secondary | ICD-10-CM

## 2017-07-18 LAB — PROTIME-INR
INR: 1.06
Prothrombin Time: 13.7 seconds (ref 11.4–15.2)

## 2017-07-18 LAB — COMPREHENSIVE METABOLIC PANEL
ALK PHOS: 53 U/L (ref 38–126)
ALT: 27 U/L (ref 17–63)
AST: 34 U/L (ref 15–41)
Albumin: 1.7 g/dL — ABNORMAL LOW (ref 3.5–5.0)
Anion gap: 8 (ref 5–15)
BUN: 23 mg/dL — AB (ref 6–20)
CO2: 24 mmol/L (ref 22–32)
CREATININE: 1.45 mg/dL — AB (ref 0.61–1.24)
Calcium: 6.7 mg/dL — ABNORMAL LOW (ref 8.9–10.3)
Chloride: 102 mmol/L (ref 101–111)
GFR, EST NON AFRICAN AMERICAN: 55 mL/min — AB (ref >60)
Glucose, Bld: 218 mg/dL — ABNORMAL HIGH (ref 65–99)
Potassium: 3.4 mmol/L — ABNORMAL LOW (ref 3.5–5.1)
Sodium: 134 mmol/L — ABNORMAL LOW (ref 135–145)
Total Bilirubin: 0.5 mg/dL (ref 0.3–1.2)
Total Protein: 4.5 g/dL — ABNORMAL LOW (ref 6.5–8.1)

## 2017-07-18 LAB — CBC
HCT: 28.7 % — ABNORMAL LOW (ref 39.0–52.0)
Hemoglobin: 9.5 g/dL — ABNORMAL LOW (ref 13.0–17.0)
MCH: 27.5 pg (ref 26.0–34.0)
MCHC: 33.1 g/dL (ref 30.0–36.0)
MCV: 83.2 fL (ref 78.0–100.0)
PLATELETS: 139 10*3/uL — AB (ref 150–400)
RBC: 3.45 MIL/uL — AB (ref 4.22–5.81)
RDW: 12.8 % (ref 11.5–15.5)
WBC: 5.9 10*3/uL (ref 4.0–10.5)

## 2017-07-18 LAB — URINE CULTURE

## 2017-07-18 LAB — GLUCOSE, CAPILLARY
GLUCOSE-CAPILLARY: 327 mg/dL — AB (ref 65–99)
Glucose-Capillary: 302 mg/dL — ABNORMAL HIGH (ref 65–99)
Glucose-Capillary: 406 mg/dL — ABNORMAL HIGH (ref 65–99)
Glucose-Capillary: 169 mg/dL — ABNORMAL HIGH (ref 65–99)

## 2017-07-18 LAB — HIV ANTIBODY (ROUTINE TESTING W REFLEX): HIV Screen 4th Generation wRfx: NONREACTIVE

## 2017-07-18 LAB — APTT: APTT: 52 s — AB (ref 24–36)

## 2017-07-18 LAB — MRSA PCR SCREENING: MRSA BY PCR: NEGATIVE

## 2017-07-18 LAB — LACTIC ACID, PLASMA: Lactic Acid, Venous: 0.9 mmol/L (ref 0.5–1.9)

## 2017-07-18 LAB — INFLUENZA PANEL BY PCR (TYPE A & B)
INFLAPCR: POSITIVE — AB
INFLBPCR: NEGATIVE

## 2017-07-18 MED ORDER — ALPRAZOLAM 0.25 MG PO TABS
0.2500 mg | ORAL_TABLET | Freq: Once | ORAL | Status: AC
  Administered 2017-07-18: 0.25 mg via ORAL
  Filled 2017-07-18: qty 1

## 2017-07-18 MED ORDER — POTASSIUM CHLORIDE CRYS ER 20 MEQ PO TBCR
40.0000 meq | EXTENDED_RELEASE_TABLET | Freq: Once | ORAL | Status: AC
  Administered 2017-07-18: 40 meq via ORAL
  Filled 2017-07-18: qty 2

## 2017-07-18 MED ORDER — GUAIFENESIN-DM 100-10 MG/5ML PO SYRP: 5.0000 mL | ORAL_SOLUTION | ORAL | Status: DC | PRN

## 2017-07-18 MED ORDER — POTASSIUM CHLORIDE IN NACL 20-0.9 MEQ/L-% IV SOLN
INTRAVENOUS | Status: DC
  Administered 2017-07-18 (×2): via INTRAVENOUS
  Filled 2017-07-18 (×3): qty 1000

## 2017-07-18 MED ORDER — INSULIN ASPART 100 UNIT/ML ~~LOC~~ SOLN
0.0000 [IU] | Freq: Three times a day (TID) | SUBCUTANEOUS | Status: DC
  Administered 2017-07-18: 7 [IU] via SUBCUTANEOUS
  Administered 2017-07-19: 5 [IU] via SUBCUTANEOUS
  Administered 2017-07-19 – 2017-07-20 (×2): 2 [IU] via SUBCUTANEOUS
  Administered 2017-07-20: 3 [IU] via SUBCUTANEOUS
  Administered 2017-07-21 (×2): 2 [IU] via SUBCUTANEOUS
  Administered 2017-07-23: 4 [IU] via SUBCUTANEOUS
  Administered 2017-07-23 – 2017-07-24 (×3): 2 [IU] via SUBCUTANEOUS
  Administered 2017-07-25: 1 [IU] via SUBCUTANEOUS
  Administered 2017-07-25: 7 [IU] via SUBCUTANEOUS
  Administered 2017-07-25: 2 [IU] via SUBCUTANEOUS
  Administered 2017-07-26: 1 [IU] via SUBCUTANEOUS
  Administered 2017-07-26 (×2): 5 [IU] via SUBCUTANEOUS
  Administered 2017-07-27: 3 [IU] via SUBCUTANEOUS
  Administered 2017-07-27: 2 [IU] via SUBCUTANEOUS

## 2017-07-18 MED ORDER — OSELTAMIVIR PHOSPHATE 75 MG PO CAPS
75.0000 mg | ORAL_CAPSULE | Freq: Two times a day (BID) | ORAL | Status: AC
  Administered 2017-07-18 – 2017-07-23 (×10): 75 mg via ORAL
  Filled 2017-07-18 (×10): qty 1

## 2017-07-18 MED ORDER — IPRATROPIUM-ALBUTEROL 0.5-2.5 (3) MG/3ML IN SOLN: 3.0000 mL | Freq: Four times a day (QID) | RESPIRATORY_TRACT | Status: DC | PRN

## 2017-07-18 MED ORDER — IPRATROPIUM-ALBUTEROL 0.5-2.5 (3) MG/3ML IN SOLN
3.0000 mL | Freq: Four times a day (QID) | RESPIRATORY_TRACT | Status: DC
  Administered 2017-07-18 (×3): 3 mL via RESPIRATORY_TRACT
  Filled 2017-07-18 (×2): qty 3

## 2017-07-18 MED ORDER — METHYLPREDNISOLONE SODIUM SUCC 125 MG IJ SOLR
60.0000 mg | Freq: Once | INTRAMUSCULAR | Status: AC
  Administered 2017-07-18: 60 mg via INTRAVENOUS
  Filled 2017-07-18: qty 2

## 2017-07-18 MED ORDER — SODIUM CHLORIDE 0.9 % IV BOLUS (SEPSIS)
500.0000 mL | Freq: Once | INTRAVENOUS | Status: AC
  Administered 2017-07-18: 500 mL via INTRAVENOUS

## 2017-07-18 MED ORDER — LOPERAMIDE HCL 2 MG PO CAPS
4.0000 mg | ORAL_CAPSULE | Freq: Once | ORAL | Status: AC
  Administered 2017-07-18: 4 mg via ORAL
  Filled 2017-07-18: qty 2

## 2017-07-18 MED ORDER — INSULIN ASPART PROT & ASPART (70-30 MIX) 100 UNIT/ML ~~LOC~~ SUSP
25.0000 [IU] | Freq: Two times a day (BID) | SUBCUTANEOUS | Status: DC
  Administered 2017-07-18 – 2017-07-19 (×3): 25 [IU] via SUBCUTANEOUS

## 2017-07-18 NOTE — Progress Notes (Signed)
PROGRESS NOTE   Willie Jordan  AST:[id number]    DOB: 13-Dec-1965    DOA: 07/17/2017  PCP: Mack Hook, MD   I have briefly reviewed patients previous medical records in [facility].  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 He presented to ED where he had fever of 102.9, hypoxic to 74% on room air, tachypnea, ill-appearing. He was admitted with sepsis secondary to community-acquired multilobar pneumonia, acute kidney injury, abdominal pain and diarrhea. Slightly better.   Assessment & Plan:   Principal Problem:   Severe sepsis ([place]) Active Problems:   HLD (hyperlipidemia)   Essential hypertension   Type 2 diabetes mellitus with hyperglycemia (H[facility])   Diabetic neuropathy ([facility])   Retinopathy due to secondary diabetes mellitus, with macular edema, with proliferative retinopathy ([place])   Community acquired bacterial pneumonia   Abdominal pain   Nausea and vomiting   AKI (acute kidney injury) ([place])   Hyponatremia   Acute respiratory failure with hypoxemia ([place])   1. Severe sepsis: Present on admission. Due to community-acquired pneumonia. Treated per sepsis protocol with IV fluids and started on IV ceftriaxone and azithromycin. Defervesced. No leukocytosis. Sepsis improved. Blood cultures 2: Negative. MRSA PCR negative. 2. Community-acquired pneumonia: Chest x-ray shows multifocal pneumonia. Continue IV ceftriaxone and  azithromycin. HIV screen negative. Check flu panel PCR. Check urine pneumococcal antigen. Repeat chest x-ray if not improving or worsening. Otherwise recommend chest x-ray in 4 weeks to ensure resolution of pneumonia findings. 3. Acute respiratory failure with hypoxia: Secondary to pneumonia. Required BiPAP overnight 1/11. Normal titrated down to 6 L/m and saturating in the mid 90s. Continue to titrate down for saturations >92%. 4. Acute kidney injury: Baseline normal creatinine. Presented with creatinine of 1.4. Continue aggressive IV fluids and follow BMP. Avoid nephrotoxins. 5. Hypokalemia: Replace and follow. 6. Diarrhea: May be part of sepsis/pneumonia. Pattern of diarrhea feels less like C. difficile. No abdominal pain and benign exam. Supportive treatment. Hold off on CT abdomen for now. If he continues to have diarrhea then will consider further testing. Monitor. 7. Dehydration with hyponatremia: IV fluids and follow. 8. Uncontrolled type II DM with peripheral neuropathy, nephropathy/proteinuria: A1c 7. Now worsened by a dose of steroids that he received in ED. Currently on 70/30 insulin 22 units twice a day, NovoLog 3 units 3 times a day with meals and SSI. Continue current regimen and titrate as needed. Will consider stopping mealtime NovoLog and titrating up his 70/30 insulin. 9. Essential hypertension: Controlled. Continue amlodipine. 10. Hyperlipidemia: Continue statins. 11. Anemia: Follow CBCs and transfuse if hemoglobin less than 7 g per DL. 12. Thrombocytopenia: Likely due to acute illness. Follow CBCs.   DVT prophylaxis: Lovenox. Code Status: Full. Family Communication: None at bedside. Disposition: DC home when medically improved,   Consultants:  None   Procedures:  BiPAP when necessary  Antimicrobials:  IV ceftriaxone and azithromycin.    Subjective: Patient interviewed using video interpreter. History as documented above. Overall feels better. No body aches, headache  or fevers. Dyspnea improved. Abdomen  feels bloated. No abdominal pain. No nausea or vomiting.  ROS: As above  Objective:  Vitals:   07/18/17 0749 07/18/17 0852 07/18/17 1155 07/18/17 1352  BP: 131/72  113/62   Pulse: 77  89   Resp:      Temp: 99.3 F (37.4 C)  98.4 F (36.9 C)   TempSrc: Axillary  Oral   SpO2: 93% (!) 89% 93% 96%  Weight:      Height:        Examination:  General exam: Young male, moderately built and nourished lying comfortably propped up in bed. Oral mucosa dry. Respiratory system: Reduced breath sounds bilaterally/harsh with scattered bilateral crackles but no wheezing or rhonchi. Respiratory effort normal. Able to speak in full sentences. Cardiovascular system: S1 & S2 heard, RRR. No JVD, murmurs, rubs, gallops or clicks. No pedal edema. Telemetry: Sinus rhythm. Gastrointestinal system: Abdomen is nondistended, soft and nontender. No organomegaly or masses felt. Normal bowel sounds heard. Central nervous system: Alert and oriented. No focal neurological deficits. Extremities: Symmetric 5 x 5 power. 2 of his right toenails with fungal infection, healing. Skin: No rashes, lesions or ulcers Psychiatry: Judgement and insight appear normal. Mood & affect appropriate.     Data Reviewed: I have personally reviewed following labs and imaging studies  CBC: Recent Labs  Lab 07/17/17 0711 07/18/17 0135  WBC 9.2 5.9  NEUTROABS 8.0*  --   HGB 11.4* 9.5*  HCT 35.1* 28.7*  MCV 82.2 83.2  PLT 171 161*   Basic Metabolic Panel: Recent Labs  Lab 07/17/17 0711 07/18/17 0135  NA 131* 134*  K 3.5 3.4*  CL 95* 102  CO2 25 24  GLUCOSE 228* 218*  BUN 25* 23*  CREATININE 1.40* 1.45*  CALCIUM 7.4* 6.7*   Liver Function Tests: Recent Labs  Lab 07/17/17 0711 07/18/17 0135  AST 45* 34  ALT 37 27  ALKPHOS 63 53  BILITOT 0.9 0.5  PROT 5.7* 4.5*  ALBUMIN 2.2* 1.7*   Coagulation Profile: Recent Labs  Lab 07/18/17 0135  INR 1.06   HbA1C: Recent Labs      07/17/17 1246  HGBA1C 7.0*   CBG: Recent Labs  Lab 07/17/17 1404 07/17/17 1816 07/17/17 2235 07/18/17 0748 07/18/17 1121  GLUCAP 153* 82 166* 302* 406*    Recent Results (from the past 240 hour(s))  Blood Culture (routine x 2)     Status: None (Preliminary result)   Collection Time: 07/17/17  7:11 AM  Result Value Ref Range Status   Specimen Description BLOOD RIGHT ANTECUBITAL  Final   Special Requests   Final    BOTTLES DRAWN AEROBIC AND ANAEROBIC Blood Culture adequate volume   Culture NO GROWTH 1 DAY  Final   Report Status PENDING  Incomplete  Urine Culture     Status: Abnormal   Collection Time: 07/17/17 11:05 AM  Result Value Ref Range Status   Specimen Description URINE, RANDOM  Final   Special Requests NONE  Final   Culture <10,000 COLONIES/mL INSIGNIFICANT GROWTH (A)  Final   Report Status 07/18/2017 FINAL  Final  Blood Culture (routine x 2)     Status: None (Preliminary result)   Collection Time: 07/17/17 12:53 PM  Result Value Ref Range Status   Specimen Description BLOOD BLOOD RIGHT WRIST  Final   Special Requests   Final    BOTTLES DRAWN AEROBIC AND ANAEROBIC Blood Culture adequate volume   Culture NO GROWTH < 24 HOURS  Final   Report Status PENDING  Incomplete  MRSA PCR Screening     Status: None   Collection Time: 07/18/17  5:09 AM  Result Value Ref Range Status   MRSA by PCR NEGATIVE NEGATIVE Final    Comment:        The GeneXpert MRSA Assay (FDA approved for NASAL specimens only), is one component of a comprehensive MRSA colonization surveillance program. It is not intended to diagnose MRSA infection nor to guide or monitor treatment for MRSA infections.          Radiology Studies: Dg Chest Port 1 View  Result Date: 07/17/2017 CLINICAL DATA:  Cough and abdominal pain EXAM: PORTABLE CHEST 1 VIEW COMPARISON:  September 27, 2016 FINDINGS: There is patchy airspace consolidation in the left base. There is also patchy airspace consolidation in  the right upper lobe. Lungs elsewhere are clear. Heart is upper normal in size with pulmonary vascularity within normal limits. No adenopathy. No bone lesions. There is aortic atherosclerosis. IMPRESSION: New patchy opacity left base and right upper lobe. Suspect multifocal pneumonia. Stable cardiac silhouette. There is aortic atherosclerosis. Aortic Atherosclerosis (ICD10-I70.0). Electronically Signed   By: Lowella Grip III M.D.   On: 07/17/2017 08:01        Scheduled Meds: . amLODipine  5 mg Oral Daily  . atorvastatin  40 mg Oral Daily  . enoxaparin (LOVENOX) injection  40 mg Subcutaneous Q24H  . gabapentin  300 mg Oral TID  . insulin aspart  0-15 Units Subcutaneous TID WC  . insulin aspart  0-5 Units Subcutaneous QHS  . insulin aspart  3 Units Subcutaneous TID WC  . insulin aspart protamine- aspart  22 Units Subcutaneous BID WC  . ipratropium-albuterol  3 mL Nebulization Q6H  . sodium chloride flush  3 mL Intravenous Q12H   Continuous Infusions: . 0.9 % NaCl with KCl 20 mEq / L 125 mL/hr at 07/18/17 1034  . azithromycin Stopped (07/18/17 1003)  . cefTRIAXone (ROCEPHIN)  IV Stopped (07/18/17 0934)     LOS: 1 day     Vernell Leep, MD, FACP, [facility]. [facility] Pager [phone]  If 7PM-7AM, please contact night-coverage [email or web address] Password [email or web address] 07/18/2017, 2:52 PM

## 2017-07-18 NOTE — Progress Notes (Addendum)
Met with pt to assess if patient was interested in partaking in Jordan clinical research study. Patient provisionally interested in study but was unable to read the consent fully as his native language is spanish and unable to read [place]. No family at bedside. Dr. Chase Caller and team to follow up with patient 07/20/17.   Rock, Willie Jordan  Title: Jordan Randomized, Double-Blind, Placebo-Controlled Dose Ranging Study Evaluating the Safety Pharmacokinetics and Clinical Benefit of FLU-IGIV in Hospitalized Patients with Serious Influenza Jordan infection. IA-001 (ClinicalTrials.gov Identifier: [id number], Protocol No: IA-001, [facility] Protocol #[id number])  RESEARCH SUBJECT. This research study is sponsored by [employer]   Protocol: amendment 4 -> 27 Nov 2016  The investigational product is called NP-025 aka FLU-IGIV or anti-influenza immune globulin intravenous. It is produced from source plasma collected from [place] Transport planner (FDA) licensed plasma collection establishments from healthy donors who have recovered from influenza (convalescent) and/or were vaccinated against seasonal influenza strains. The plasma contains Jordan relatively high concentration of polyclonal antibodies directed against seasonal influenza strains, specifically influenza Jordan strains H1N1 ([place]) and H3N2 ([place]). It is Jordan glycoprotein of 150-160 kilodaltons against Hemagluttinin (HA) and Neuraminidase (NA) surface proteins.  ................................................  PI OVERSIGHT ATTESTATION  I the Principal Investigator (PI) for the above mentioned study attest that I reviewed the above mentioned clinical research nurse) notes on research subject  Willie Jordan  born [birth year] . I   D/w her yesterday after she met with patient and did pre-consent. And d/w her again 07/19/2017 9:21 AM  - per  Her conversation with Willie Jordan - first symptoms were GI (nausea, vomit, myalgia,  fever, headache) started on Tuesday 07/14/2017 at 4pm and this is documented in epic EMR on his visit to [facility] Dr Amil Amen as well who he saw 07/15/17 evening around 16.20 . He was febrile to 100.5 at that time with some abdominal tenderness. Chart review does NOT show any respiratory complaints or objective findings  For the respiratory system at that time. Treated as viral gastroenteritis. In d/w patient Willie Jordan via his bedside RN Carly: respiratory symptoms of cough started 07/17/17, denied sore throat, dyspnea started Tuesday 07/14/17 4pm but he did not report this to Dr Amil Amen on 07/15/17 visit  Per bedside RN Carly on 07/19/2017 and per research RN yesterday: patient can understand English and communicate but will not be able to read Jordan consent form in English and comprehend due to language barrier. Patient is not confused or delirious since admit through  07/19/2017 9:36 AM   Also Recent Labs  Lab 07/17/17 0711 07/18/17 0135  CREATININE 1.40* 1.45*    Estimated Creatinine Clearance: 61.6 mL/min (Jordan) (by C-G formula based on SCr of 1.45 mg/dL (H)).   D/w Dr Algis Liming  07/19/2017 AM - the primary Rx MD: research team will monitor for I/E criteria and also ability to consent    Dr. Brand Males, M.D., F.C.C.P., ACRP-CPI Pulmonary and Critical Care Medicine Principal Investigator & Staff Physician [facility] [place] [facility] Pager: [phone], If no answer or between  15:00h - 7:00h: call [phone]  07/19/2017 9:20 AM

## 2017-07-18 NOTE — ED Notes (Signed)
RT at bedside.

## 2017-07-18 NOTE — ED Notes (Signed)
Pt asking if we can give him something for diarrhea. Pt states that he's afraid to eat anything because of it. Paged MD.  Pt noted to be desat'ing in the upper 80s while resting in room. Contacted RT for placement of venturi mask.

## 2017-07-18 NOTE — Progress Notes (Signed)
On call Hospitalist notified regarding pt desat to 86% while sleeping. Pt currently on venturi mask at 15L. Order for Duoneb, solumedrol and Bipap ordered. Meds given per orders. Pt now at 92% on 15L. Will continue to monitor and put pt on bipap if needed, per orders.

## 2017-07-18 NOTE — Progress Notes (Signed)
Dr. Algis Liming paged about patient's positive influenza A. Verbal order for TAMIFLU was placed.

## 2017-07-19 ENCOUNTER — Inpatient Hospital Stay (HOSPITAL_COMMUNITY): Payer: Self-pay

## 2017-07-19 ENCOUNTER — Other Ambulatory Visit: Payer: Self-pay

## 2017-07-19 LAB — CBC
HEMATOCRIT: 31 % — AB (ref 39.0–52.0)
HEMOGLOBIN: 10.1 g/dL — AB (ref 13.0–17.0)
MCH: 26.9 pg (ref 26.0–34.0)
MCHC: 32.6 g/dL (ref 30.0–36.0)
MCV: 82.4 fL (ref 78.0–100.0)
Platelets: 162 10*3/uL (ref 150–400)
RBC: 3.76 MIL/uL — ABNORMAL LOW (ref 4.22–5.81)
RDW: 12.5 % (ref 11.5–15.5)
WBC: 8 10*3/uL (ref 4.0–10.5)

## 2017-07-19 LAB — GLUCOSE, CAPILLARY
GLUCOSE-CAPILLARY: 195 mg/dL — AB (ref 65–99)
GLUCOSE-CAPILLARY: 257 mg/dL — AB (ref 65–99)
GLUCOSE-CAPILLARY: 120 mg/dL — AB (ref 65–99)

## 2017-07-19 LAB — BLOOD GAS, ARTERIAL
ACID-BASE DEFICIT: 1.2 mmol/L (ref 0.0–2.0)
Bicarbonate: 23.1 mmol/L (ref 20.0–28.0)
O2 CONTENT: 5 L/min
O2 SAT: 86.4 %
PATIENT TEMPERATURE: 98.6
PCO2 ART: 39.1 mmHg (ref 32.0–48.0)
pH, Arterial: 7.389 (ref 7.350–7.450)
pO2, Arterial: 53.4 mmHg — ABNORMAL LOW (ref 83.0–108.0)

## 2017-07-19 LAB — BASIC METABOLIC PANEL
ANION GAP: 7 (ref 5–15)
BUN: 23 mg/dL — AB (ref 6–20)
CALCIUM: 7 mg/dL — AB (ref 8.9–10.3)
CO2: 22 mmol/L (ref 22–32)
Chloride: 107 mmol/L (ref 101–111)
Creatinine, Ser: 1.12 mg/dL (ref 0.61–1.24)
GFR calc Af Amer: >60 mL/min (ref >60)
GFR calc non Af Amer: >60 mL/min (ref >60)
GLUCOSE: 191 mg/dL — AB (ref 65–99)
Potassium: 4.4 mmol/L (ref 3.5–5.1)
Sodium: 136 mmol/L (ref 135–145)

## 2017-07-19 LAB — MAGNESIUM: MAGNESIUM: 2.2 mg/dL (ref 1.7–2.4)

## 2017-07-19 LAB — PROCALCITONIN: PROCALCITONIN: 0.26 ng/mL

## 2017-07-19 MED ORDER — AZITHROMYCIN 250 MG PO TABS
500.0000 mg | ORAL_TABLET | Freq: Every day | ORAL | Status: DC
  Administered 2017-07-20 – 2017-07-23 (×4): 500 mg via ORAL
  Filled 2017-07-19 (×4): qty 2

## 2017-07-19 MED ORDER — IPRATROPIUM-ALBUTEROL 0.5-2.5 (3) MG/3ML IN SOLN
3.0000 mL | Freq: Four times a day (QID) | RESPIRATORY_TRACT | Status: DC
  Administered 2017-07-19 – 2017-07-23 (×16): 3 mL via RESPIRATORY_TRACT
  Filled 2017-07-19 (×17): qty 3

## 2017-07-19 MED ORDER — BUDESONIDE 0.25 MG/2ML IN SUSP
0.2500 mg | Freq: Two times a day (BID) | RESPIRATORY_TRACT | Status: DC
  Administered 2017-07-19 – 2017-07-27 (×15): 0.25 mg via RESPIRATORY_TRACT
  Filled 2017-07-19 (×15): qty 2

## 2017-07-19 MED ORDER — LORAZEPAM 1 MG PO TABS
1.0000 mg | ORAL_TABLET | Freq: Once | ORAL | Status: AC
  Administered 2017-07-19: 1 mg via ORAL
  Filled 2017-07-19: qty 1

## 2017-07-19 MED ORDER — ALBUTEROL SULFATE (2.5 MG/3ML) 0.083% IN NEBU: 2.5000 mg | INHALATION_SOLUTION | Freq: Four times a day (QID) | RESPIRATORY_TRACT | Status: DC | PRN

## 2017-07-19 NOTE — Progress Notes (Addendum)
PROGRESS NOTE   Willie Jordan  WER:[id number]    DOB: 1966/07/06    DOA: 07/17/2017  PCP: Mack Hook, MD   I have briefly reviewed patients previous medical records in [facility].  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 He presented to ED where he had fever of 102.9, hypoxic to 74% on room air, tachypnea, ill-appearing. He was admitted with sepsis secondary to community-acquired multilobar pneumonia, acute kidney injury, abdominal pain and diarrhea. Ruled in for influenza A. Hypoxic requiring higher nasal cannula oxygen and BiPAP overnight for last 2 nights. PCCM consulted 1/13.   Assessment & Plan:   Principal Problem:   Severe sepsis ([place]) Active Problems:   HLD (hyperlipidemia)   Essential hypertension   Type 2 diabetes mellitus with hyperglycemia (HCC)   Diabetic neuropathy (HCC)   Retinopathy due to secondary diabetes mellitus, with macular edema, with proliferative retinopathy ([place])   Community acquired bacterial pneumonia   Abdominal pain   Nausea and vomiting   AKI (acute kidney injury) ([place])   Hyponatremia   Acute respiratory failure with hypoxemia ([place])   1. Severe sepsis: Present on admission. Due to influenza A acute bronchitis and community-acquired pneumonia. Treated per sepsis protocol with IV fluids and started on IV ceftriaxone and azithromycin. Defervesced. No leukocytosis. Sepsis improved. Blood  cultures 2: Negative. MRSA PCR negative. Flu panel PCR positive for influenza A on 1/12. 2. Community-acquired pneumonia: Chest x-ray shows multifocal pneumonia. Continue IV ceftriaxone and azithromycin. HIV screen negative. Check urine pneumococcal antigen-to be sent. Chest x-ray shows worsening of patchy bilateral pulmonary opacities. Needs follow-up chest x-ray in 4 weeks to insure clearing of pneumonia. Pro-calcitonin 0.26. 3. Influenza A with acute bronchitis: Started Tamiflu 07/18/17. Supportive treatment. 4. Acute respiratory failure with hypoxia: Secondary to pneumonia and influenza A acute bronchitis. Required BiPAP overnight 1/11 and 1/12. Currently on 5 L [place] oxygen and saturating in the low 90s. ABG reviewed, PO2 53.4 and oxygen saturation 86.4. Concerned that he still might get worse prior to improvement. Consulted CCM 1/13. Hypoxia more at night while sleeping? Underlying OSA. 5. Acute kidney injury: Baseline normal creatinine. Presented with creatinine of 1.4. Resolved after IV fluids. Avoid nephrotoxins. 6. Hypokalemia: Replaced. 7. Diarrhea: May be part of sepsis/pneumonia. Pattern of diarrhea feels less like C. difficile. No abdominal pain and benign exam. Supportive treatment. Hold off on CT abdomen for now. If he continues to have diarrhea then will consider further testing. Diarrhea resolved. 8. Dehydration with hyponatremia: Resolved after IV fluids, discontinue. 9. Uncontrolled type II DM with peripheral neuropathy, nephropathy/proteinuria: A1c 7. Worsened by a dose of steroids that he received in ED. NovoLog 70/30 insulin increased to 25 units twice a day and added SSI. Improved but fluctuating. It just as needed. 10. Essential hypertension: Controlled. Continue amlodipine. 11. Hyperlipidemia: Continue statins. 12. Anemia: Follow CBCs and transfuse if hemoglobin less than 7 g per DL. 13. Thrombocytopenia: Likely due to acute illness. Resolved.   DVT prophylaxis: Lovenox. Code  Status: Full. Family Communication: None at bedside.  Disposition: Remains in stepdown unit. DC home when medically improved.   Consultants:  PCCM  Procedures:  BiPAP when necessary  Antimicrobials:  IV ceftriaxone and azithromycin.    Subjective: Patient interviewed and examined with the assistance of review interpreter. Head uncomfortable night due to BiPAP that was "pushing air in". Off BiPAP since morning and breathing is better. Cough with mild blood. Abdominal distention without pain. No BM since night of admission. Mild bilateral leg edema.  ROS: As above  Objective:  Vitals:   07/19/17 0535 07/19/17 0536 07/19/17 1123 07/19/17 1239  BP: 129/73   121/70  Pulse: 73 73  72  Resp: 20     Temp:    98.1 F (36.7 C)  TempSrc:    Oral  SpO2: 91% 92% 92% (!) 89%  Weight:      Height:        Examination:  General exam: Young male, moderately built and nourished lying comfortably propped up in bed. Oral mucosa moist. Does not appear in any distress. Respiratory system: Breath sounds harsh bilaterally, especially posteriorly. Occasional anterior rhonchi. No increased work of breathing. Cardiovascular system: S1 & S2 heard, RRR. No JVD, murmurs, rubs, gallops or clicks. No pedal edema. Telemetry: Sinus rhythm. Stable without change. Gastrointestinal system: Abdomen is nondistended, soft and nontender. No organomegaly or masses felt. Normal bowel sounds heard. Stable without change. Central nervous system: Alert and oriented. No focal neurological deficits. Stable without change. Extremities: Symmetric 5 x 5 power. 2 of his right toenails with fungal infection, healing. Skin: No rashes, lesions or ulcers Psychiatry: Judgement and insight appear normal. Mood & affect appropriate.     Data Reviewed: I have personally reviewed following labs and imaging studies  CBC: Recent Labs  Lab 07/17/17 0711 07/18/17 0135 07/19/17 0902  WBC 9.2 5.9 8.0  NEUTROABS 8.0*  --   --     HGB 11.4* 9.5* 10.1*  HCT 35.1* 28.7* 31.0*  MCV 82.2 83.2 82.4  PLT 171 139* 151   Basic Metabolic Panel: Recent Labs  Lab 07/17/17 0711 07/18/17 0135 07/19/17 0824 07/19/17 0902  NA 131* 134*  --  136  K 3.5 3.4*  --  4.4  CL 95* 102  --  107  CO2 25 24  --  22  GLUCOSE 228* 218*  --  191*  BUN 25* 23*  --  23*  CREATININE 1.40* 1.45*  --  1.12  CALCIUM 7.4* 6.7*  --  7.0*  MG  --   --  2.2  --    Liver Function Tests: Recent Labs  Lab 07/17/17 0711 07/18/17 0135  AST 45* 34  ALT 37 27  ALKPHOS 63 53  BILITOT 0.9 0.5  PROT 5.7* 4.5*  ALBUMIN 2.2* 1.7*   Coagulation Profile: Recent Labs  Lab 07/18/17 0135  INR 1.06   HbA1C: Recent Labs    07/17/17 1246  HGBA1C 7.0*   CBG: Recent Labs  Lab 07/18/17 1121 07/18/17 1625 07/18/17 2048 07/19/17 0908 07/19/17 1209  GLUCAP 406* 327* 169* 195* 257*    Recent Results (from the past 240 hour(s))  Blood Culture (routine x 2)     Status: None (Preliminary result)   Collection Time: 07/17/17  7:11 AM  Result Value Ref Range Status   Specimen Description BLOOD RIGHT ANTECUBITAL  Final   Special Requests   Final    BOTTLES DRAWN AEROBIC AND ANAEROBIC Blood Culture adequate volume   Culture NO GROWTH 2 DAYS  Final  Report Status PENDING  Incomplete  Urine Culture     Status: Abnormal   Collection Time: 07/17/17 11:05 AM  Result Value Ref Range Status   Specimen Description URINE, RANDOM  Final   Special Requests NONE  Final   Culture <10,000 COLONIES/mL INSIGNIFICANT GROWTH (A)  Final   Report Status 07/18/2017 FINAL  Final  Blood Culture (routine x 2)     Status: None (Preliminary result)   Collection Time: 07/17/17 12:53 PM  Result Value Ref Range Status   Specimen Description BLOOD BLOOD RIGHT WRIST  Final   Special Requests   Final    BOTTLES DRAWN AEROBIC AND ANAEROBIC Blood Culture adequate volume   Culture NO GROWTH 2 DAYS  Final   Report Status PENDING  Incomplete  MRSA PCR Screening      Status: None   Collection Time: 07/18/17  5:09 AM  Result Value Ref Range Status   MRSA by PCR NEGATIVE NEGATIVE Final    Comment:        The GeneXpert MRSA Assay (FDA approved for NASAL specimens only), is one component of a comprehensive MRSA colonization surveillance program. It is not intended to diagnose MRSA infection nor to guide or monitor treatment for MRSA infections.          Radiology Studies: Dg Chest 2 View  Result Date: 07/19/2017 CLINICAL DATA:  Pneumonia EXAM: CHEST  2 VIEW COMPARISON:  07/17/2017 FINDINGS: Bilateral upper lobe patchy opacities have increased. Mild patchy opacities at the left base unchanged. Low volumes. Normal heart size. No pneumothorax. Small pleural effusions are suspected. IMPRESSION: Patchy bilateral pulmonary opacities have increased. Followup PA and lateral chest X-ray is recommended in 3-4 weeks following trial of antibiotic therapy to ensure resolution and exclude underlying malignancy. Small pleural effusions are suspected. Electronically Signed   By: Marybelle Killings M.D.   On: 07/19/2017 08:20        Scheduled Meds: . amLODipine  5 mg Oral Daily  . atorvastatin  40 mg Oral Daily  . [START ON 07/20/2017] azithromycin  500 mg Oral Daily  . enoxaparin (LOVENOX) injection  40 mg Subcutaneous Q24H  . gabapentin  300 mg Oral TID  . insulin aspart  0-9 Units Subcutaneous TID WC  . insulin aspart protamine- aspart  25 Units Subcutaneous BID WC  . oseltamivir  75 mg Oral BID  . sodium chloride flush  3 mL Intravenous Q12H   Continuous Infusions: . cefTRIAXone (ROCEPHIN)  IV Stopped (07/19/17 0160)     LOS: 2 days     Vernell Leep, MD, FACP, [facility]. [facility] Pager [phone]  If 7PM-7AM, please contact night-coverage [email or web address] Password [facility] 07/19/2017, 3:53 PM

## 2017-07-19 NOTE — Progress Notes (Signed)
PHARMACIST - PHYSICIAN COMMUNICATION CONCERNING: Antibiotic IV to Oral Route Change Policy  RECOMMENDATION: This patient is receiving azithromycin by the intravenous route.  Based on criteria approved by the Pharmacy and Therapeutics Committee, the antibiotic(s) is/are being converted to the equivalent oral dose form(s).  DESCRIPTION: These criteria include:  Patient being treated for a respiratory tract infection, urinary tract infection, cellulitis or clostridium difficile associated diarrhea if on metronidazole  The patient is not neutropenic and does not exhibit a GI malabsorption state  The patient is eating (either orally or via tube) and/or has been taking other orally administered medications for a least 24 hours  The patient is improving clinically and has a Tmax < 100.5  If you have questions about this conversion, please contact the Pharmacy Department  []   [phone] )  [facility] [x]   [phone] )  [facility] Pontes  []   [phone] )  [facility] []   [phone] )  [place]. Gerarda Fraction, PharmD PGY1 Pharmacy Resident Pager: [phone]

## 2017-07-19 NOTE — Progress Notes (Signed)
Noted recent ABG with low PO2, and SpO2 on monitor.  Placed on HFNC.  Pt denies increased WOB at this time.  Normal acid/base balance ab increased WOB or difficulty breathing develop, will return to NIV

## 2017-07-19 NOTE — Consult Note (Signed)
[facility] Pulmonary Diseases & Critical Care Medicine Initial Pulmonary/Critical Care Consultation  Patient Name: Willie Jordan MRN: [id number] DOB: Oct 12, 1965    ADMISSION DATE:  07/17/2017 CONSULTATION DATE:  07/19/2017  REFERRING MD:  Dr. Vernell Leep  REASON FOR CONSULTATION:  Respiratory distress  History was obtained with the assistance of live interpreter Legrand Como #[id number]) HISTORY OF PRESENT ILLNESS  This 52 y.o. Hispanic male is seen in consultation at the request of Dr. Vernell Leep for recommendations on further evaluation and management of respiratory distress. The patient was admitted for treatment of multilobar pneumonia, AKI, abdominal pain and diarrhea. He presented with complaints of abdominal pain and diarrhea while on clindamycin, which apparently was prescribed for a toenail infection. He subsequently developed URI symptoms. He presented to the ER with fever 102.9 F, hypoxia (74%) and appeared toxic. During his hospitalization, he has been found to have a positive influenza screen.  Earlier today, he was on 5 LPM but progressively increased in FiO2 requirement, ending up on BiPAP. He reports that he could feel that it was helping but he has difficulty tolerating the positive pressure.  REVIEW OF SYSTEMS Constitutional: No weight loss. No night sweats. No fever. No chills. No fatigue. HEENT: nasal congestion and drainage of (burnt wood).No headaches, dysphagia, sore throat, otalgia,PND. CV:  No chest pain, orthopnea, PND, swelling in lower extremities, palpitations. GI:  midepigastric abdominal pain. No nausea, vomiting, diarrhea, change in bowel pattern, anorexia Resp: dry cough. DOE but no dyspnea at rest. No sputum production. GU: no dysuria, change in color of urine, no urgency or frequency.  No flank pain. MS:  No joint pain or swelling. No myalgias,  No decreased range of motion.  Psych:  No change in mood or affect. No memory loss. Skin: no rash or  lesions.   PAST MEDICAL/SURGICAL/SOCIAL/FAMILY HISTORIES   Past Medical History:  Diagnosis Date  . Diabetic neuropathy ([place]) 03/17/2017  . Hyperlipidemia   . Hypertension   . Microalbuminuria due to type 2 diabetes mellitus ([place]) 01/30/2017  . Neuropathy   . Onychomycosis of toenail 01/30/2017  . Peripheral neuropathy 03/17/2017  . Type 2 diabetes mellitus, uncontrolled ([place])     Past Surgical History:  Procedure Laterality Date  . AMPUTATION Left 09/29/2016   Procedure: 2nd RAY AMPUTATION LEFT FOOT I&D LEFT FOOT;  Surgeon: Edrick Kins, DPM;  Location: [place];  Service: Podiatry;  Laterality: Left;  . APPENDECTOMY      Social History   Tobacco Use  . Smoking status: Former Smoker    Types: Cigarettes    Start date: 10/31/2015    Last attempt to quit: 01/05/2016    Years since quitting: 1.5  . Smokeless tobacco: Never Used  Substance Use Topics  . Alcohol use: No    Alcohol/week: 0.0 oz    Comment: rarely    Family History  Problem Relation Age of Onset  . Diabetes Mother   . Diabetes Sister   . Diabetes Brother   . Cancer Maternal Grandmother      Allergies  Allergen Reactions  . Aspirin Rash     Prior to Admission medications   Medication Sig Start Date End Date Taking? Authorizing Provider  amLODipine (NORVASC) 5 MG tablet Take 1 tablet (5 mg total) by mouth daily. 02/10/17  Yes Mack Hook, MD  atorvastatin (LIPITOR) 40 MG tablet Take 1 tablet (40 mg total) by mouth daily. 06/03/17  Yes Mack Hook, MD  gabapentin (NEURONTIN) 300 MG capsule Take 1 capsule (300 mg  total) by mouth 3 (three) times daily. 11/28/16  Yes Mack Hook, MD  Insulin Lispro Prot & Lispro (HUMALOG MIX 75/25 KWIKPEN) (75-25) 100 UNIT/ML Kwikpen Inject 26 units subcutaneously in the morning and 22 units in the evening before meals Patient taking differently: Inject 26 units subcutaneously in the morning and 24 units in the evening before meals 11/28/16  Yes Mack Hook, MD  lisinopril (PRINIVIL,ZESTRIL) 20 MG tablet 1 tab by mouth daily 01/30/17  Yes Mack Hook, MD  lisinopril-hydrochlorothiazide (PRINZIDE,ZESTORETIC) 20-25 MG tablet 1 tab by mouth in morning with other 20 mg Lisinopril 03/17/17  Yes Mack Hook, MD  metFORMIN (GLUCOPHAGE-XR) 500 MG 24 hr tablet 2 tabs by mouth with breakfast daily 11/28/16  Yes Mack Hook, MD  ondansetron (ZOFRAN ODT) 4 MG disintegrating tablet Take 1 tablet (4 mg total) by mouth every 8 (eight) hours as needed for nausea or vomiting. 07/15/17  Yes Mack Hook, MD  blood glucose meter kit and supplies Dispense based on patient and insurance preference. Use up to four times daily as directed. (FOR ICD-9 250.00, 250.01). 10/05/16   Patrecia Pour, MD  Insulin Pen Needle 31G X 5 MM MISC Use as directed twice daily 01/12/17   Mack Hook, MD    Current Facility-Administered Medications  Medication Dose Route Frequency Provider Last Rate Last Dose  . acetaminophen (TYLENOL) tablet 650 mg  650 mg Oral Q6H PRN Lady Deutscher, MD   650 mg at 07/18/17 0439   Or  . acetaminophen (TYLENOL) suppository 650 mg  650 mg Rectal Q6H PRN Lady Deutscher, MD      . amLODipine (NORVASC) tablet 5 mg  5 mg Oral Daily Lady Deutscher, MD   5 mg at 07/19/17 0853  . atorvastatin (LIPITOR) tablet 40 mg  40 mg Oral Daily Lady Deutscher, MD   40 mg at 07/19/17 0853  . [START ON 07/20/2017] azithromycin (ZITHROMAX) tablet 500 mg  500 mg Oral Daily Hongalgi, Anand D, MD      . cefTRIAXone (ROCEPHIN) 1 g in dextrose 5 % 50 mL IVPB  1 g Intravenous Q24H Rumbarger, Valeda Malm, RPH   Stopped at 07/19/17 2595  . enoxaparin (LOVENOX) injection 40 mg  40 mg Subcutaneous Q24H Lady Deutscher, MD   40 mg at 07/19/17 1209  . gabapentin (NEURONTIN) capsule 300 mg  300 mg Oral TID Lady Deutscher, MD   300 mg at 07/19/17 0853  . guaiFENesin-dextromethorphan (ROBITUSSIN DM) 100-10 MG/5ML syrup 5 mL  5 mL Oral  Q4H PRN Hongalgi, Anand D, MD      . insulin aspart (novoLOG) injection 0-9 Units  0-9 Units Subcutaneous TID WC Modena Jansky, MD   5 Units at 07/19/17 1209  . insulin aspart protamine- aspart (NOVOLOG MIX 70/30) injection 25 Units  25 Units Subcutaneous BID WC Modena Jansky, MD   25 Units at 07/19/17 0936  . ipratropium-albuterol (DUONEB) 0.5-2.5 (3) MG/3ML nebulizer solution 3 mL  3 mL Nebulization Q6H PRN Hongalgi, Anand D, MD      . ondansetron (ZOFRAN) tablet 4 mg  4 mg Oral Q6H PRN Lady Deutscher, MD       Or  . ondansetron [facility]) injection 4 mg  4 mg Intravenous Q6H PRN Lady Deutscher, MD   4 mg at 07/19/17 0451  . oseltamivir (TAMIFLU) capsule 75 mg  75 mg Oral BID Modena Jansky, MD   75 mg at 07/19/17 0853  . polyethylene glycol (MIRALAX /  GLYCOLAX) packet 17 g  17 g Oral Daily PRN Lady Deutscher, MD      . sodium chloride flush (NS) 0.9 % injection 3 mL  3 mL Intravenous Q12H Lady Deutscher, MD   3 mL at 07/19/17 0855     VITAL SIGNS: BP 121/70 (BP Location: Right Arm)   Pulse 72   Temp 98.1 F (36.7 C) (Oral)   Resp 20   Ht '5\' 5"'  (1.651 m)   Wt 84.4 kg (186 lb)   SpO2 (!) 89%   BMI 30.95 kg/m   HEMODYNAMICS:    VENTILATOR SETTINGS:    INTAKE / OUTPUT: I/O last 3 completed shifts: In: 4017.1 [P.O.:440; I.V.:3277.1; IV Piggyback:300] Out: 400 [Urine:400]  PHYSICAL EXAMINATION: General:  Pleasant. Well-developed. Alert, awake and oriented to time, person and place. Cooperative. No acute distress. Normal affect. Head: normocephalic, atraumatic EYE: PERRLA, EOM intact, no scleral icterus, no pallor Nose: nares are patent. No polyps. No exudate. No sinus tenderness. Throat/Oral Cavity: Normal dentition. No oral thrush. No exudate. Mucous membranes are moist. No tonsillar enlargement. Neck: supple, no thyromegaly, no JVD, no lymphadenopathy. Trachea midline. Chest/Lung: symmetric in development and expansion. Good air entry. Diffuse  bilateral crackles. No wheezes. Heart: Regular S1 and S2 without murmur, rub or gallop. Abdomen: soft, nontender, nondistended. Normoactive bowel sounds. n rebound. No guarding. Extremities: no pedal edema, no cyanosis, no clubbing. 2+ DP pulses Lymphatic: no cervical/axiallary/inguinal lymph nodes appreciated Skin:  No rash or lesion. NEURO: cranial nerves II-XII are grossly symmetric and physiologic. Babinski absent. No sensory deficit. No motor deficit. DTR 2+ @ RUE, 2+ @ LUE 2+ @ RLL,  2+ @ LLL. No cerebellar signs. Gait was not assessed.   LABS:  BMET Recent Labs  Lab 07/17/17 0711 07/18/17 0135 07/19/17 0902  NA 131* 134* 136  K 3.5 3.4* 4.4  CL 95* 102 107  CO2 '25 24 22  ' BUN 25* 23* 23*  CREATININE 1.40* 1.45* 1.12  GLUCOSE 228* 218* 191*    Electrolytes Recent Labs  Lab 07/17/17 0711 07/18/17 0135 07/19/17 0824 07/19/17 0902  CALCIUM 7.4* 6.7*  --  7.0*  MG  --   --  2.2  --     CBC Recent Labs  Lab 07/17/17 0711 07/18/17 0135 07/19/17 0902  WBC 9.2 5.9 8.0  HGB 11.4* 9.5* 10.1*  HCT 35.1* 28.7* 31.0*  PLT 171 139* 162    Coag's Recent Labs  Lab 07/18/17 0135  APTT 52*  INR 1.06    Sepsis Markers Recent Labs  Lab 07/17/17 0726 07/17/17 1246 07/18/17 0135 07/19/17 0824  LATICACIDVEN 1.17 1.1 0.9  --   PROCALCITON  --  0.25  --  0.26    ABG Recent Labs  Lab 07/19/17 1107  PHART 7.389  PCO2ART 39.1  PO2ART 53.4*    Liver Enzymes Recent Labs  Lab 07/17/17 0711 07/18/17 0135  AST 45* 34  ALT 37 27  ALKPHOS 63 53  BILITOT 0.9 0.5  ALBUMIN 2.2* 1.7*    Cardiac Enzymes No results for input(s): TROPONINI, PROBNP in the last 168 hours.  Glucose Recent Labs  Lab 07/18/17 0748 07/18/17 1121 07/18/17 1625 07/18/17 2048 07/19/17 0908 07/19/17 1209  GLUCAP 302* 406* 327* 169* 195* 257*    Imaging Dg Chest 2 View  Result Date: 07/19/2017 CLINICAL DATA:  Pneumonia EXAM: CHEST  2 VIEW COMPARISON:  07/17/2017 FINDINGS:  Bilateral upper lobe patchy opacities have increased. Mild patchy opacities at the left base unchanged. Low  volumes. Normal heart size. No pneumothorax. Small pleural effusions are suspected. IMPRESSION: Patchy bilateral pulmonary opacities have increased. Followup PA and lateral chest X-ray is recommended in 3-4 weeks following trial of antibiotic therapy to ensure resolution and exclude underlying malignancy. Small pleural effusions are suspected. Electronically Signed   By: Marybelle Killings M.D.   On: 07/19/2017 08:20    CULTURES: Results for orders placed or performed during the hospital encounter of 07/17/17  Blood Culture (routine x 2)     Status: None (Preliminary result)   Collection Time: 07/17/17  7:11 AM  Result Value Ref Range Status   Specimen Description BLOOD RIGHT ANTECUBITAL  Final   Special Requests   Final    BOTTLES DRAWN AEROBIC AND ANAEROBIC Blood Culture adequate volume   Culture NO GROWTH 2 DAYS  Final   Report Status PENDING  Incomplete  Urine Culture     Status: Abnormal   Collection Time: 07/17/17 11:05 AM  Result Value Ref Range Status   Specimen Description URINE, RANDOM  Final   Special Requests NONE  Final   Culture <10,000 COLONIES/mL INSIGNIFICANT GROWTH (A)  Final   Report Status 07/18/2017 FINAL  Final  Blood Culture (routine x 2)     Status: None (Preliminary result)   Collection Time: 07/17/17 12:53 PM  Result Value Ref Range Status   Specimen Description BLOOD BLOOD RIGHT WRIST  Final   Special Requests   Final    BOTTLES DRAWN AEROBIC AND ANAEROBIC Blood Culture adequate volume   Culture NO GROWTH 2 DAYS  Final   Report Status PENDING  Incomplete  MRSA PCR Screening     Status: None   Collection Time: 07/18/17  5:09 AM  Result Value Ref Range Status   MRSA by PCR NEGATIVE NEGATIVE Final    Comment:        The GeneXpert MRSA Assay (FDA approved for NASAL specimens only), is one component of a comprehensive MRSA colonization surveillance program.  It is not intended to diagnose MRSA infection nor to guide or monitor treatment for MRSA infections.     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 Keep SpO2 93+% The patient has been educated on some basic differences in "BiPAP breathing" vs. Physiologic breathing in hopes that this will alleviate some of the anxiety associated with feeling the positive pressure. Albuterol PRN prior to BiPAP use, as it can help alleviate positive-pressure induced bronchospasm. Ativan 1 mg IV PRN when starting BIPAP. May need to consider Precedex gtt. I asked the patient if he would be willing to allow intubation and mechanical ventilatory support in the event that he deteriorated and was not effectively supported by BiPAP. He REFUSED. Furthermore, he stipulated that his CODE STATUS should be DNR/DNI. [facility] [phone]).   Renee Pain, MD Board Certified by the ABIM, [place] Pager: [phone]  07/19/2017, 6:28 PM

## 2017-07-19 NOTE — Progress Notes (Addendum)
Pt desating on Bipap while sleeping (79-83%). RT and on call NP Baltazar Najjar notified. Repositioned pt and asked pt to take deep breaths. O2 sat came back up to 96%. RT in room with pt adjusting settings. Pt switched to CPAP per RT request. Order obtained for CPAP. Pt continued to desat while sleeping on CPAP around 86-88%. Kirby re-paged and instructed to place pt back on bipap. RT paged and now putting pt back on Bipap to maintain O2 at >92%. Will continue to monitor pt.

## 2017-07-20 ENCOUNTER — Inpatient Hospital Stay (HOSPITAL_COMMUNITY): Payer: Self-pay

## 2017-07-20 DIAGNOSIS — J101 Influenza due to other identified influenza virus with other respiratory manifestations: Secondary | ICD-10-CM

## 2017-07-20 DIAGNOSIS — I5030 Unspecified diastolic (congestive) heart failure: Secondary | ICD-10-CM

## 2017-07-20 DIAGNOSIS — J9601 Acute respiratory failure with hypoxia: Secondary | ICD-10-CM

## 2017-07-20 LAB — ECHOCARDIOGRAM COMPLETE
Height: 65 in
Weight: 3128 oz

## 2017-07-20 LAB — GLUCOSE, CAPILLARY
GLUCOSE-CAPILLARY: 189 mg/dL — AB (ref 65–99)
GLUCOSE-CAPILLARY: 206 mg/dL — AB (ref 65–99)
Glucose-Capillary: 66 mg/dL (ref 65–99)
Glucose-Capillary: 109 mg/dL — ABNORMAL HIGH (ref 65–99)

## 2017-07-20 LAB — STREP PNEUMONIAE URINARY ANTIGEN: STREP PNEUMO URINARY ANTIGEN: NEGATIVE

## 2017-07-20 LAB — PROCALCITONIN: Procalcitonin: 0.15 ng/mL

## 2017-07-20 MED ORDER — LORAZEPAM 1 MG PO TABS
1.0000 mg | ORAL_TABLET | Freq: Every evening | ORAL | Status: DC | PRN
  Administered 2017-07-20: 1 mg via ORAL
  Filled 2017-07-20: qty 1

## 2017-07-20 MED ORDER — FUROSEMIDE 10 MG/ML IJ SOLN
20.0000 mg | Freq: Once | INTRAMUSCULAR | Status: AC
  Administered 2017-07-20: 20 mg via INTRAVENOUS
  Filled 2017-07-20: qty 2

## 2017-07-20 MED ORDER — POLYETHYLENE GLYCOL 3350 17 G PO PACK
17.0000 g | PACK | Freq: Every day | ORAL | Status: DC
  Administered 2017-07-20 – 2017-07-23 (×4): 17 g via ORAL
  Filled 2017-07-20 (×6): qty 1

## 2017-07-20 MED ORDER — POTASSIUM CHLORIDE CRYS ER 20 MEQ PO TBCR
40.0000 meq | EXTENDED_RELEASE_TABLET | Freq: Once | ORAL | Status: AC
  Administered 2017-07-20: 40 meq via ORAL
  Filled 2017-07-20: qty 2

## 2017-07-20 MED ORDER — INSULIN ASPART PROT & ASPART (70-30 MIX) 100 UNIT/ML ~~LOC~~ SUSP
23.0000 [IU] | Freq: Two times a day (BID) | SUBCUTANEOUS | Status: DC
  Administered 2017-07-20 – 2017-07-22 (×4): 23 [IU] via SUBCUTANEOUS
  Filled 2017-07-20: qty 10

## 2017-07-20 NOTE — Plan of Care (Signed)
  Clinical Measurements: Respiratory complications will improve. Pt did not want to wear Bipap and took it off around 0200 switching over to HF [place] at 10L. Pt desats while sleeping to around 88% O2 with HF [place] at 10L. Assisted pt in sitting up in bed and taking deep breaths. Explained the importance of using the Bipap however, pt still refused. Asked pt if he wanted me to get the portable interpreter but he refused and stated that he understood everything. Will continue to encourage pt. 07/20/2017 9969 - Progressing by Mellissa Kohut, RN

## 2017-07-20 NOTE — Progress Notes (Signed)
Inpatient Diabetes Program Recommendations  AACE/ADA: New Consensus Statement on Inpatient Glycemic Control (2015)  Target Ranges:  Prepandial:   less than 140 mg/dL      Peak postprandial:   less than 180 mg/dL (1-2 hours)      Critically ill patients:  140 - 180 mg/dL   Results for Willie Jordan, Willie Jordan (MRN [id number]) as of 07/20/2017 12:02  Ref. Range 07/19/2017 09:08 07/19/2017 12:09 07/19/2017 16:07 07/20/2017 07:51  Glucose-Capillary Latest Ref Range: 65 - 99 mg/dL 195 (H) 257 (H) 120 (H) 66   Review of Glycemic Control  Diabetes history: DM 2 Outpatient Diabetes medications: 75/25 26 units qam, 24 units qpm, Metformin 2000 mg Daily Current orders for Inpatient glycemic control: 70/30 25 units BID, Novolog Sensitive Correction 0-9 units  A1c 7% on 1/11  Inpatient Diabetes Program Recommendations:    Fasting glucose 66 this am. Consider decreasing pm dose of 70/30 to 23 units.  Thanks,  Tama Headings RN, MSN, [facility] Inpatient Diabetes Coordinator Team Pager [phone] (8a-5p)

## 2017-07-20 NOTE — Progress Notes (Signed)
PROGRESS NOTE   Willie Jordan  IWL:[id number]    DOB: 1966-03-06    DOA: 07/17/2017  PCP: Mack Hook, MD   I have briefly reviewed patients previous medical records in [facility].  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 He presented to ED where he had fever of 102.9, hypoxic to 74% on room air, tachypnea, ill-appearing. He was admitted with sepsis secondary to community-acquired multilobar pneumonia, acute kidney injury, abdominal pain and diarrhea. Ruled in for influenza A. Hypoxic requiring higher nasal cannula oxygen and BiPAP overnight for last 2 nights. PCCM consulted 1/13.   Assessment & Plan:   Principal Problem:   Severe sepsis ([place]) Active Problems:   HLD (hyperlipidemia)   Essential hypertension   Type 2 diabetes mellitus with hyperglycemia (HCC)   Diabetic neuropathy (HCC)   Retinopathy due to secondary diabetes mellitus, with macular edema, with proliferative retinopathy ([place])   Community acquired bacterial pneumonia   Abdominal pain   Nausea and vomiting   AKI (acute kidney injury) ([place])   Hyponatremia   Acute respiratory failure with hypoxemia ([place])   1. Severe sepsis: Present on admission. Due to influenza  A acute bronchitis and possible community-acquired pneumonia. Treated per sepsis protocol with IV fluids and started on IV ceftriaxone and azithromycin. Defervesced. No leukocytosis. Sepsis improved.  Blood cultures 2: Negative. MRSA PCR negative. Flu panel PCR positive for influenza A on 1/12. CCM suspects all related to influenza rather than pneumonia. Ceftriaxone discontinued 1/14. 2. Possible Community-acquired pneumonia: Chest x-ray shows multifocal pneumonia. Treated with IV ceftriaxone and azithromycin. HIV screen negative. Chest x-ray shows worsening of patchy bilateral pulmonary opacities. Needs follow-up chest x-ray in 4 weeks to insure clearing of pneumonia. Pro-calcitonin 0.26. As per PCCM follow-up, discussed with Dr. Chase Caller, less likely to be bacterial pneumonia. Ceftriaxone discontinued. Continue azithromycin. 3. Influenza A with acute bronchitis: Started Tamiflu 07/18/17. Supportive treatment. 4. Volume overload: +4.5 L since admission. Stopped IV fluids. Giving IV Lasix 40 mg 1. Checking 2-D echo. 5. Acute respiratory failure with hypoxia: Secondary to pneumonia and influenza A acute bronchitis. Due to high oxygen requirement and nightly BiPAP use, CCM consulted 1/13. Required BiPAP overnight again. Down to HFNC 10 L/m this morning. Continue oxygen, when necessary BiPAP. Diuresing with IV Lasix today. Follow echo. Titrate down oxygen as tolerated for saturations >92%. 6. Acute kidney injury: Baseline normal creatinine. Presented with creatinine of 1.4. Resolved after IV fluids. Avoid nephrotoxins. 7. Hypokalemia: Replaced. 8. Diarrhea: May be part of sepsis/pneumonia. Pattern of diarrhea feels less like C. difficile. No abdominal pain and benign exam. Supportive treatment. Hold off on CT abdomen for now. If he continues to have diarrhea then will consider further testing. Diarrhea resolved. Now constipated. Start MiraLAX. 9. Dehydration with hyponatremia: Resolved after IV fluids, discontinued. 10. Uncontrolled type II DM with peripheral neuropathy, nephropathy/proteinuria: A1c 7. Worsened by a dose of steroids that he received in ED. NovoLog 70/30 insulin increased to 25 units twice  a day and added SSI. No hypoglycemic 66 this morning.  Reduce insulin to prior home dose and monitor closely. 11. Essential hypertension: Controlled. Continue amlodipine. 12. Hyperlipidemia: Continue statins. 13. Anemia: Follow CBCs and transfuse if hemoglobin less than 7 g per DL. Stable. 14. Thrombocytopenia: Likely due to acute illness. Resolved.   DVT prophylaxis: Lovenox. Code Status: Full Code. To clarify CODE STATUS (he had expressed DO NOT RESUSCITATE with PCCM consult on 1/13) I discussed in detail regarding his CODE STATUS with the assistance of video interpreter. He is agreeable to full code. However he does not wish to be on prolonged artificial life support with poor QOL or in a persistent vegetative state. Family Communication: None at bedside. Disposition: Remains in stepdown unit. DC home when medically improved.   Consultants:  PCCM  Procedures:  BiPAP when necessary  Antimicrobials:  IV ceftriaxone   Subjective: Patient interviewed using video interpreter. Feels better. No chest pain. Mild cough but no production. Abdomen feels bloated. No abdominal pain. No BM >48 hours.  ROS: As above  Objective:  Vitals:   07/20/17 0428 07/20/17 0752 07/20/17 0914 07/20/17 1158  BP: 135/69 (!) 148/72  (!) 168/81  Pulse: 82 76    Resp: 20     Temp: 99.9 F (37.7 C) 98.5 F (36.9 C)  98.1 F (36.7 C)  TempSrc: Oral Oral  Oral  SpO2: 92% 90% 92%   Weight: 88.7 kg (195 lb 8 oz)     Height:        Examination:  General exam: Young male, moderately built and nourished lying comfortably propped up in bed. Oral mucosa moist. Does not appear in any distress.  Respiratory system: Improved breath sounds. Still harsh bilaterally. Few bilateral basal crackles. No wheezing or rhonchi. No increased work of breathing. Cardiovascular system: S1 & S2 heard, RRR. No JVD, murmurs, rubs, gallops or clicks. Trace bilateral ankle edema. Telemetry: Sinus rhythm. Stable without  change. Gastrointestinal system: Abdomen is mildly distended, soft and nontender. No organomegaly or masses felt. Normal bowel sounds heard.  Central nervous system: Alert and oriented. No focal neurological deficits. Stable without change. Extremities: Symmetric 5 x 5 power. 2 of his right toenails with fungal infection, healing. Skin: No rashes, lesions or ulcers Psychiatry: Judgement and insight appear normal. Mood & affect appropriate.     Data Reviewed: I have personally reviewed following labs and imaging studies  CBC: Recent Labs  Lab 07/17/17 0711 07/18/17 0135 07/19/17 0902  WBC 9.2 5.9 8.0  NEUTROABS 8.0*  --   --   HGB 11.4* 9.5* 10.1*  HCT 35.1* 28.7* 31.0*  MCV 82.2 83.2 82.4  PLT 171 139* 604   Basic Metabolic Panel: Recent Labs  Lab 07/17/17 0711 07/18/17 0135 07/19/17 0824 07/19/17 0902  NA 131* 134*  --  136  K 3.5 3.4*  --  4.4  CL 95* 102  --  107  CO2 25 24  --  22  GLUCOSE 228* 218*  --  191*  BUN 25* 23*  --  23*  CREATININE 1.40* 1.45*  --  1.12  CALCIUM 7.4* 6.7*  --  7.0*  MG  --   --  2.2  --    Liver Function Tests: Recent Labs  Lab 07/17/17 0711 07/18/17 0135  AST 45* 34  ALT 37 27  ALKPHOS 63 53  BILITOT 0.9 0.5  PROT 5.7* 4.5*  ALBUMIN 2.2* 1.7*   Coagulation Profile: Recent Labs  Lab 07/18/17 0135  INR 1.06   HbA1C: No results for input(s): HGBA1C in the  last 72 hours. CBG: Recent Labs  Lab 07/19/17 0908 07/19/17 1209 07/19/17 1607 07/20/17 0751 07/20/17 1156  GLUCAP 195* 257* 120* 66 189*    Recent Results (from the past 240 hour(s))  Blood Culture (routine x 2)     Status: None (Preliminary result)   Collection Time: 07/17/17  7:11 AM  Result Value Ref Range Status   Specimen Description BLOOD RIGHT ANTECUBITAL  Final   Special Requests   Final    BOTTLES DRAWN AEROBIC AND ANAEROBIC Blood Culture adequate volume   Culture NO GROWTH 2 DAYS  Final   Report Status PENDING  Incomplete  Urine Culture      Status: Abnormal   Collection Time: 07/17/17 11:05 AM  Result Value Ref Range Status   Specimen Description URINE, RANDOM  Final   Special Requests NONE  Final   Culture <10,000 COLONIES/mL INSIGNIFICANT GROWTH (A)  Final   Report Status 07/18/2017 FINAL  Final  Blood Culture (routine x 2)     Status: None (Preliminary result)   Collection Time: 07/17/17 12:53 PM  Result Value Ref Range Status   Specimen Description BLOOD BLOOD RIGHT WRIST  Final   Special Requests   Final    BOTTLES DRAWN AEROBIC AND ANAEROBIC Blood Culture adequate volume   Culture NO GROWTH 2 DAYS  Final   Report Status PENDING  Incomplete  MRSA PCR Screening     Status: None   Collection Time: 07/18/17  5:09 AM  Result Value Ref Range Status   MRSA by PCR NEGATIVE NEGATIVE Final    Comment:        The GeneXpert MRSA Assay (FDA approved for NASAL specimens only), is one component of a comprehensive MRSA colonization surveillance program. It is not intended to diagnose MRSA infection nor to guide or monitor treatment for MRSA infections.          Radiology Studies: Dg Chest 2 View  Result Date: 07/19/2017 CLINICAL DATA:  Pneumonia EXAM: CHEST  2 VIEW COMPARISON:  07/17/2017 FINDINGS: Bilateral upper lobe patchy opacities have increased. Mild patchy opacities at the left base unchanged. Low volumes. Normal heart size. No pneumothorax. Small pleural effusions are suspected. IMPRESSION: Patchy bilateral pulmonary opacities have increased. Followup PA and lateral chest X-ray is recommended in 3-4 weeks following trial of antibiotic therapy to ensure resolution and exclude underlying malignancy. Small pleural effusions are suspected. Electronically Signed   By: Marybelle Killings M.D.   On: 07/19/2017 08:20        Scheduled Meds: . amLODipine  5 mg Oral Daily  . atorvastatin  40 mg Oral Daily  . azithromycin  500 mg Oral Daily  . budesonide (PULMICORT) nebulizer solution  0.25 mg Nebulization BID  .  enoxaparin (LOVENOX) injection  40 mg Subcutaneous Q24H  . gabapentin  300 mg Oral TID  . insulin aspart  0-9 Units Subcutaneous TID WC  . insulin aspart protamine- aspart  25 Units Subcutaneous BID WC  . ipratropium-albuterol  3 mL Nebulization Q6H  . oseltamivir  75 mg Oral BID  . sodium chloride flush  3 mL Intravenous Q12H   Continuous Infusions:    LOS: 3 days     Vernell Leep, MD, FACP, [facility]. [facility] Pager [phone]  If 7PM-7AM, please contact night-coverage [email or web address] Password [email or web address] 07/20/2017, 1:04 PM

## 2017-07-20 NOTE — Progress Notes (Signed)
  Echocardiogram 2D Echocardiogram has been performed.  Willie Jordan 07/20/2017, 11:50 AM

## 2017-07-20 NOTE — Progress Notes (Signed)
[facility] Pulmonary Diseases & Critical Care Medicine Initial Pulmonary/Critical Care Consultation  Patient Name: Willie Jordan MRN: [id number] DOB: [birth year]    ADMISSION DATE:  07/17/2017 CONSULTATION DATE:  07/19/2017  REFERRING MD:  Dr. Vernell Leep  REASON FOR CONSULTATION:  Respiratory distress   brief  This 52 y.o. Hispanic male is seen in consultation at the request of Dr. Vernell Leep for recommendations on further evaluation and management of respiratory distress. The patient was admitted for treatment of multilobar pneumonia, AKI, abdominal pain and diarrhea. He presented with complaints of abdominal pain and diarrhea while on clindamycin, which apparently was prescribed for a toenail infection. He subsequently developed URI symptoms. He presented to the ER with fever 102.9 F, hypoxia (74%) and appeared toxic. During his hospitalization, he has been found to have a positive influenza screen.  Earlier today, he was on 5 LPM but progressively increased in FiO2 requirement, ending up on BiPAP. He reports that he could feel that it was helping but he has difficulty tolerating the positive pressure.   EVENTs 07/17/2017 - admit 07/19/17 - pccm consult   SUBJECTIVE/OVERNIGHT/INTERVAL HX 07/20/17 - gettni better slowly. But stil  Needing 10L [place]. Got BIPAP QHs. Says he wants prn bipap in day. Frustrated he is not fully better. Creat better.. Says he can speak basic english but cannot read consent for flu study due to language literacy in [place] reading though he is interesed in flu study   + 4.5L since admit  VITAL SIGNS: BP (!) 148/72 (BP Location: Left Arm)   Pulse 76   Temp 98.5 F (36.9 C) (Oral)   Resp 20   Ht 5\' 5"  (1.651 m)   Wt 195 lb 8 oz (88.7 kg)   SpO2 90%   BMI 32.53 kg/m   HEMODYNAMICS:    VENTILATOR SETTINGS: FiO2 (%):  [60 %] 60 %  INTAKE / OUTPUT: I/O last 3 completed shifts: In: 360 [P.O.:360] Out: 700 [Urine:700]  PHYSICAL  EXAMINATION:   General Appearance:    Looks ok  Head:    Normocephalic, without obvious abnormality, atraumatic  Eyes:    PERRL - yes, conjunctiva/corneas - clear      Ears:    Normal external ear canals, both ears  Nose:   NG tube - no but has Handley  Throat:  ETT TUBE - no , OG tube - no  Neck:   Supple,  No enlargement/tenderness/nodules     Lungs:     Clear to auscultation bilaterally but with scattere crackles  Chest wall:    No deformity  Heart:    S1 and S2 normal, no murmur, CVP - no.  Pressors - no  Abdomen:     Soft, no masses, no organomegaly  Genitalia:    Not done  Rectal:   not done  Extremities:   Extremities- intact     Skin:   Intact in exposed areas .      Neurologic:   Sedation - none -> RASS - +1 . Moves all 4s - yes. CAM-ICU - neg . Orientation - x3+       LABS: PULMONARY Recent Labs  Lab 07/19/17 1107  PHART 7.389  PCO2ART 39.1  PO2ART 53.4*  HCO3 23.1  O2SAT 86.4    CBC Recent Labs  Lab 07/17/17 0711 07/18/17 0135 07/19/17 0902  HGB 11.4* 9.5* 10.1*  HCT 35.1* 28.7* 31.0*  WBC 9.2 5.9 8.0  PLT 171 139* 162    COAGULATION Recent Labs  Lab 07/18/17  0135  INR 1.06    CARDIAC  No results for input(s): TROPONINI in the last 168 hours. No results for input(s): PROBNP in the last 168 hours.   CHEMISTRY Recent Labs  Lab 07/17/17 0711 07/18/17 0135 07/19/17 0824 07/19/17 0902  NA 131* 134*  --  136  K 3.5 3.4*  --  4.4  CL 95* 102  --  107  CO2 25 24  --  22  GLUCOSE 228* 218*  --  191*  BUN 25* 23*  --  23*  CREATININE 1.40* 1.45*  --  1.12  CALCIUM 7.4* 6.7*  --  7.0*  MG  --   --  2.2  --    Estimated Creatinine Clearance: 81.7 mL/min (by C-G formula based on SCr of 1.12 mg/dL).   LIVER Recent Labs  Lab 07/17/17 0711 07/18/17 0135  AST 45* 34  ALT 37 27  ALKPHOS 63 53  BILITOT 0.9 0.5  PROT 5.7* 4.5*  ALBUMIN 2.2* 1.7*  INR  --  1.06     INFECTIOUS Recent Labs  Lab 07/17/17 0726 07/17/17 1246  07/18/17 0135 07/19/17 0824  LATICACIDVEN 1.17 1.1 0.9  --   PROCALCITON  --  0.25  --  0.26     ENDOCRINE CBG (last 3)  Recent Labs    07/19/17 1209 07/19/17 1607 07/20/17 0751  GLUCAP 257* 120* 66         IMAGING x48h  - image(s) personally visualized  -   highlighted in bold Dg Chest 2 View  Result Date: 07/19/2017 CLINICAL DATA:  Pneumonia EXAM: CHEST  2 VIEW COMPARISON:  07/17/2017 FINDINGS: Bilateral upper lobe patchy opacities have increased. Mild patchy opacities at the left base unchanged. Low volumes. Normal heart size. No pneumothorax. Small pleural effusions are suspected. IMPRESSION: Patchy bilateral pulmonary opacities have increased. Followup PA and lateral chest X-ray is recommended in 3-4 weeks following trial of antibiotic therapy to ensure resolution and exclude underlying malignancy. Small pleural effusions are suspected. Electronically Signed   By: Marybelle Killings M.D.   On: 07/19/2017 08:20      ASSESSMENT / PLAN: Principal Problem:   Severe sepsis ([place]) Active Problems:   HLD (hyperlipidemia)   Essential hypertension   Type 2 diabetes mellitus with hyperglycemia (HCC)   Diabetic neuropathy (HCC)   Retinopathy due to secondary diabetes mellitus, with macular edema, with proliferative retinopathy ([place])   Community acquired bacterial pneumonia   Abdominal pain   Nausea and vomiting   AKI (acute kidney injury) ([place])   Hyponatremia   Acute respiratory failure with hypoxemia (HCC)   ASSESSMENT Acute resp failure with Flu A - improved but still persistent; This si due to flu A related acute lung injury and +/- volume overload. No evidence of CAP-bacterial so fare  AKI - resolved. Volume overload +  PLAN contnue tamiflu  Would be a good candidate for SGS FLu IVIG v placebo study -he is interesed but we are not approved to consent with a spanish consent; so he cannot paricipate  o2 for pulse ox > 88%  Continue azithro; dc ceftriazone (looking  less and less like CABP)  Lasix 40mg  IV x 1 with kcl 40  biPAP QHS + day time prn at his demand   Patient updated  Dr. Brand Males, M.D., [facility].C.P Pulmonary and Critical Care Medicine Staff Physician, [place] Director - Interstitial Lung Disease  Program  [facility] [place] [facility] [place], [place], [address]  Pager: [phone], If no answer or between  15:00h - 7:00h: call [phone] Telephone: [phone]

## 2017-07-21 LAB — CBC WITH DIFFERENTIAL/PLATELET
BASOS ABS: 0 10*3/uL (ref 0.0–0.1)
BASOS PCT: 0 %
EOS ABS: 0.1 10*3/uL (ref 0.0–0.7)
EOS PCT: 1 %
HCT: 31.5 % — ABNORMAL LOW (ref 39.0–52.0)
Hemoglobin: 10.1 g/dL — ABNORMAL LOW (ref 13.0–17.0)
Lymphocytes Relative: 16 %
Lymphs Abs: 1.3 10*3/uL (ref 0.7–4.0)
MCH: 26.5 pg (ref 26.0–34.0)
MCHC: 32.1 g/dL (ref 30.0–36.0)
MCV: 82.7 fL (ref 78.0–100.0)
Monocytes Absolute: 0.9 10*3/uL (ref 0.1–1.0)
Monocytes Relative: 11 %
NEUTROS PCT: 72 %
Neutro Abs: 5.7 10*3/uL (ref 1.7–7.7)
PLATELETS: 211 10*3/uL (ref 150–400)
RBC: 3.81 MIL/uL — AB (ref 4.22–5.81)
RDW: 12.4 % (ref 11.5–15.5)
WBC: 7.9 10*3/uL (ref 4.0–10.5)

## 2017-07-21 LAB — GLUCOSE, CAPILLARY
GLUCOSE-CAPILLARY: 110 mg/dL — AB (ref 65–99)
Glucose-Capillary: 176 mg/dL — ABNORMAL HIGH (ref 65–99)
Glucose-Capillary: 156 mg/dL — ABNORMAL HIGH (ref 65–99)
Glucose-Capillary: 79 mg/dL (ref 65–99)

## 2017-07-21 LAB — BASIC METABOLIC PANEL
ANION GAP: 8 (ref 5–15)
BUN: 16 mg/dL (ref 6–20)
CO2: 25 mmol/L (ref 22–32)
Calcium: 7.5 mg/dL — ABNORMAL LOW (ref 8.9–10.3)
Chloride: 103 mmol/L (ref 101–111)
Creatinine, Ser: 1.07 mg/dL (ref 0.61–1.24)
GLUCOSE: 142 mg/dL — AB (ref 65–99)
POTASSIUM: 3.9 mmol/L (ref 3.5–5.1)
SODIUM: 136 mmol/L (ref 135–145)

## 2017-07-21 LAB — PHOSPHORUS: PHOSPHORUS: 3.5 mg/dL (ref 2.5–4.6)

## 2017-07-21 LAB — LEGIONELLA PNEUMOPHILA SEROGP 1 UR AG: L. pneumophila Serogp 1 Ur Ag: NEGATIVE

## 2017-07-21 LAB — MAGNESIUM: MAGNESIUM: 2.1 mg/dL (ref 1.7–2.4)

## 2017-07-21 NOTE — Progress Notes (Signed)
Weaned O2 to 8 LPM HFNC due to spo2 at 94%, pt cleared nose and oxygenation improved.

## 2017-07-21 NOTE — Plan of Care (Signed)
  Progressing Clinical Measurements: Ability to maintain clinical measurements within normal limits will improve 07/21/2017 0418 - Progressing by Colonel Bald, RN Note VSS. Continues O2 dependency; on 10L HFNC, to maintain O2 sats.

## 2017-07-21 NOTE — Progress Notes (Signed)
PROGRESS NOTE   Willie Jordan  TFT:[id number]    DOB: 1965-12-17    DOA: 07/17/2017  PCP: Mack Hook, MD   I have briefly reviewed patients previous medical records in [facility].  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  Admit 07/17/17 fever of 102.9, hypoxic to 74% on room air, tachypnea, ill-appearing. He was admitted with sepsis secondary to community-acquired multilobar pneumonia, acute kidney injury, abdominal pain and diarrhea. Ruled in for influenza A. Hypoxic requiring high flow nasal cannula oxygen and BiPAP overnight for last 2 nights. PCCM consulted 1/13.   Assessment & Plan:   Principal Problem:   Severe sepsis ([place]) Active Problems:   HLD (hyperlipidemia)   Essential hypertension   Type 2 diabetes mellitus with hyperglycemia (HCC)   Diabetic neuropathy (HCC)   Retinopathy due to secondary diabetes mellitus, with macular edema, with proliferative retinopathy ([place])   Community acquired bacterial pneumonia   Abdominal pain   Nausea and vomiting   AKI (acute kidney injury) ([place])   Hyponatremia   Acute respiratory failure with hypoxemia (HCC)   Severe sepsis Due to influenza  A acute bronchitis, ? CAP and possible community-acquired pneumonia. Rx  IV fluids and started on IV ceftriaxone and azithromycin. Sepsis improved. Blood cultures 2: Negative. MRSA PCR negative. Flu panel PCR positive for influenza A on 1/12. CCM suspects all related to influenza rather than pneumonia. Ceftriaxone discontinued 1/14-on  Azithro now ? Community-acquired pneumonia: . Chest x-ray shows worsening of patchy bilateral pulmonary opacities. Needs follow-up chest x-ray in 4 weeks to insure clearing of pneumonia. Pro-calcitonin 0.26. As per PCCM Dr. Chase Caller, less likely to be bacterial pneumonia. Ceftriaxone discontinued. Continue azithromycin. Await Strep PNA and Legionalla Influenza A with acute bronchitis: Started Tamiflu 07/18/17. Supportive treatment. Acute decompensated diastolic HF, Grd 2 on Echo 1.14.19: +942 cc since admission. Stopped IV fluids. Rpt Lasix 40 bid orally and monitor ins and outs strict fluid restriction 1200 cc order placed Acute respiratory failure with hypoxia 2/2 influenza A acute bronchitis with superimposed heart failure. Due to high oxygen requirement and nightly BiPAP use, CCM consulted 1/13. . Down to HFNC 8 L/m. Continue oxygen, when necessary BiPAP. Wean as tol Acute kidney injury: Baseline normal creatinine. Presented with creatinine of 1.4. Resolved after IV fluids. Avoid nephrotoxins.lasix resumed and changed as above Hypokalemia: Replaced. Diarrhea: Diarrhea resolved. Now constipated. Start MiraLAX. Dehydration with hyponatremia: Resolved after IV fluids, discontinued. Uncontrolled type II DM with peripheral neuropathy, nephropathy/proteinuria: A1c 7. Worsened by a dose of steroids that he received in ED. NovoLog 70/30 insulin  25 units twice a day and added SSI. hypogly to 66--now on 23 U bid.  Cont gabapentin 300 tid for neuropathy Essential hypertension: Controlled. Continue amlodipine 5 daily Hyperlipidemia: Continue statins. Anemia: Follow CBCs and transfuse if hemoglobin less than 7 g per DL. Stable. Thrombocytopenia: Likely due to acute illness. Resolved. Nausea vomiting-started 1/14, has this every 2-3 months-?  Diabetic gastroparesis-trial pantoprazole and Reglan today and see if helps   DVT prophylaxis: Lovenox. Code Status:  full code.  Family Communication: None at  bedside. Disposition: DC home when medically improved.   Consultants:  PCCM  Procedures:  BiPAP when necessary  Antimicrobials:  IV ceftriaxone--stopped Tamiflu 1/12 Azithro 1/11   Subjective: Awake alert no distress states has been having nausea and vomiting and has been drinking a lot of water No chest pain Breathing overall seems better-feels that was better secondary to Lasix  ROS: As above  Objective:  Vitals:   07/21/17 0131 07/21/17 0430 07/21/17 0748 07/21/17 0928  BP:  (!) 166/81 (!) 151/68   Pulse:  83 78 77  Resp:  (!) 22  18  Temp:  98.1 F (36.7 C) 98.6 F (37 C)   TempSrc:  Oral Oral   SpO2: 97% 93% 92% 96%  Weight:  88 kg (194 lb 1.6 oz)    Height:        Examination:  Pleasant awake alert not in distress on high flow cannula no icterus no pallor no JVD S1-S2 slightly tacky no murmur Chest crackly bilaterally posteriorly Abdomen soft nontender no rebound no guarding Lower extremities show no rash nor swelling well-healed amputation scar of left fourth toe No lower extremity swelling    Data Reviewed: I have personally reviewed following labs and imaging studies  CBC: Recent Labs  Lab 07/17/17 0711 07/18/17 0135 07/19/17 0902 07/21/17 0441  WBC 9.2 5.9 8.0 7.9  NEUTROABS 8.0*  --   --  5.7  HGB 11.4* 9.5* 10.1* 10.1*  HCT 35.1* 28.7* 31.0* 31.5*  MCV 82.2 83.2 82.4 82.7  PLT 171 139* 162 350   Basic Metabolic Panel: Recent Labs  Lab 07/17/17 0711 07/18/17 0135 07/19/17 0824 07/19/17 0902 07/21/17 0026 07/21/17 0441  NA 131* 134*  --  136 136  --   K 3.5 3.4*  --  4.4 3.9  --   CL 95* 102  --  107 103  --   CO2 25 24  --  22 25  --   GLUCOSE 228* 218*  --  191* 142*  --   BUN 25* 23*  --  23* 16  --   CREATININE 1.40* 1.45*  --  1.12 1.07  --   CALCIUM 7.4* 6.7*  --  7.0* 7.5*  --   MG  --   --  2.2  --   --  2.1  PHOS  --   --   --   --   --  3.5   Liver Function Tests: Recent Labs  Lab 07/17/17 0711 07/18/17 0135   AST 45* 34  ALT 37 27  ALKPHOS 63 53  BILITOT 0.9 0.5  PROT 5.7* 4.5*  ALBUMIN 2.2* 1.7*   Coagulation Profile: Recent Labs  Lab 07/18/17 0135  INR 1.06   HbA1C: No results for input(s): HGBA1C in the last 72 hours. CBG: Recent Labs  Lab 07/20/17 0751 07/20/17 1156 07/20/17 1612 07/20/17 2203 07/21/17 0748  GLUCAP 66 189* 206* 109* 110*    Recent Results (from the past 240 hour(s))  Blood Culture (routine x 2)     Status: None (Preliminary result)   Collection Time: 07/17/17  7:11 AM  Result Value Ref Range Status   Specimen Description BLOOD RIGHT ANTECUBITAL  Final   Special Requests   Final    BOTTLES DRAWN AEROBIC AND ANAEROBIC Blood Culture adequate volume   Culture NO GROWTH 3 DAYS  Final   Report Status PENDING  Incomplete  Urine Culture     Status: Abnormal   Collection Time: 07/17/17 11:05 AM  Result Value Ref Range Status   Specimen Description URINE,  RANDOM  Final   Special Requests NONE  Final   Culture <10,000 COLONIES/mL INSIGNIFICANT GROWTH (A)  Final   Report Status 07/18/2017 FINAL  Final  Blood Culture (routine x 2)     Status: None (Preliminary result)   Collection Time: 07/17/17 12:53 PM  Result Value Ref Range Status   Specimen Description BLOOD BLOOD RIGHT WRIST  Final   Special Requests   Final    BOTTLES DRAWN AEROBIC AND ANAEROBIC Blood Culture adequate volume   Culture NO GROWTH 3 DAYS  Final   Report Status PENDING  Incomplete  MRSA PCR Screening     Status: None   Collection Time: 07/18/17  5:09 AM  Result Value Ref Range Status   MRSA by PCR NEGATIVE NEGATIVE Final    Comment:        The GeneXpert MRSA Assay (FDA approved for NASAL specimens only), is one component of a comprehensive MRSA colonization surveillance program. It is not intended to diagnose MRSA infection nor to guide or monitor treatment for MRSA infections.          Radiology Studies: Dg Abd Portable 1v  Result Date: 07/20/2017 CLINICAL DATA:   Ileus.  History of diabetes. EXAM: PORTABLE ABDOMEN - 1 VIEW COMPARISON:  Chest radiograph 07/19/2017 FINDINGS: Gas is demonstrated within nondilated loops of large and small bowel. Supine evaluation limited for the detection of free intraperitoneal air. Unremarkable osseous skeleton. IMPRESSION: Nonobstructed bowel gas pattern. Electronically Signed   By: Lovey Newcomer M.D.   On: 07/20/2017 17:49        Scheduled Meds: . amLODipine  5 mg Oral Daily  . atorvastatin  40 mg Oral Daily  . azithromycin  500 mg Oral Daily  . budesonide (PULMICORT) nebulizer solution  0.25 mg Nebulization BID  . enoxaparin (LOVENOX) injection  40 mg Subcutaneous Q24H  . gabapentin  300 mg Oral TID  . insulin aspart  0-9 Units Subcutaneous TID WC  . insulin aspart protamine- aspart  23 Units Subcutaneous BID WC  . ipratropium-albuterol  3 mL Nebulization Q6H  . oseltamivir  75 mg Oral BID  . polyethylene glycol  17 g Oral Daily  . sodium chloride flush  3 mL Intravenous Q12H   Continuous Infusions:    LOS: 4 days     Verneita Griffes, MD [facility] ([phone]   If 7PM-7AM, please contact night-coverage [email or web address] Password [facility] 07/21/2017, 9:33 AM

## 2017-07-22 DIAGNOSIS — J159 Unspecified bacterial pneumonia: Secondary | ICD-10-CM

## 2017-07-22 LAB — CBC WITH DIFFERENTIAL/PLATELET
BASOS ABS: 0 10*3/uL (ref 0.0–0.1)
BASOS PCT: 0 %
EOS ABS: 0.2 10*3/uL (ref 0.0–0.7)
Eosinophils Relative: 2 %
HEMATOCRIT: 31.4 % — AB (ref 39.0–52.0)
Hemoglobin: 10.3 g/dL — ABNORMAL LOW (ref 13.0–17.0)
Lymphocytes Relative: 14 %
Lymphs Abs: 1.1 10*3/uL (ref 0.7–4.0)
MCH: 26.9 pg (ref 26.0–34.0)
MCHC: 32.8 g/dL (ref 30.0–36.0)
MCV: 82 fL (ref 78.0–100.0)
Monocytes Absolute: 0.9 10*3/uL (ref 0.1–1.0)
Monocytes Relative: 12 %
NEUTROS ABS: 5.8 10*3/uL (ref 1.7–7.7)
NEUTROS PCT: 72 %
Platelets: 246 10*3/uL (ref 150–400)
RBC: 3.83 MIL/uL — AB (ref 4.22–5.81)
RDW: 12.3 % (ref 11.5–15.5)
WBC: 8.1 10*3/uL (ref 4.0–10.5)

## 2017-07-22 LAB — COMPREHENSIVE METABOLIC PANEL
ALK PHOS: 88 U/L (ref 38–126)
ALT: 45 U/L (ref 17–63)
ANION GAP: 9 (ref 5–15)
AST: 41 U/L (ref 15–41)
Albumin: 1.7 g/dL — ABNORMAL LOW (ref 3.5–5.0)
BILIRUBIN TOTAL: 0.4 mg/dL (ref 0.3–1.2)
BUN: 13 mg/dL (ref 6–20)
CO2: 26 mmol/L (ref 22–32)
CREATININE: 1.03 mg/dL (ref 0.61–1.24)
Calcium: 8.2 mg/dL — ABNORMAL LOW (ref 8.9–10.3)
Chloride: 102 mmol/L (ref 101–111)
Glucose, Bld: 91 mg/dL (ref 65–99)
Potassium: 4.5 mmol/L (ref 3.5–5.1)
SODIUM: 137 mmol/L (ref 135–145)
TOTAL PROTEIN: 5.6 g/dL — AB (ref 6.5–8.1)

## 2017-07-22 LAB — CULTURE, BLOOD (ROUTINE X 2)
CULTURE: NO GROWTH
Culture: NO GROWTH
SPECIAL REQUESTS: ADEQUATE
Special Requests: ADEQUATE

## 2017-07-22 LAB — GLUCOSE, CAPILLARY
GLUCOSE-CAPILLARY: 76 mg/dL (ref 65–99)
GLUCOSE-CAPILLARY: 110 mg/dL — AB (ref 65–99)
GLUCOSE-CAPILLARY: 120 mg/dL — AB (ref 65–99)
Glucose-Capillary: 82 mg/dL (ref 65–99)

## 2017-07-22 LAB — MAGNESIUM: Magnesium: 2.1 mg/dL (ref 1.7–2.4)

## 2017-07-22 LAB — PHOSPHORUS: Phosphorus: 4.1 mg/dL (ref 2.5–4.6)

## 2017-07-22 MED ORDER — FUROSEMIDE 10 MG/ML IJ SOLN
40.0000 mg | Freq: Two times a day (BID) | INTRAMUSCULAR | Status: DC
  Administered 2017-07-22 – 2017-07-23 (×3): 40 mg via INTRAVENOUS
  Filled 2017-07-22 (×3): qty 4

## 2017-07-22 MED ORDER — INSULIN ASPART PROT & ASPART (70-30 MIX) 100 UNIT/ML ~~LOC~~ SUSP
20.0000 [IU] | Freq: Two times a day (BID) | SUBCUTANEOUS | Status: DC
  Administered 2017-07-22 – 2017-07-27 (×10): 20 [IU] via SUBCUTANEOUS
  Filled 2017-07-22: qty 10

## 2017-07-22 NOTE — Progress Notes (Signed)
[facility] Pulmonary Diseases & Critical Care Medicine Initial Pulmonary/Critical Care Consultation  Patient Name: Willie Jordan MRN: [id number] DOB: 15-May-1966    ADMISSION DATE:  07/17/2017 CONSULTATION DATE:  07/19/2017  REFERRING MD:  Dr. Vernell Leep  REASON FOR CONSULTATION:  Respiratory distress   brief  This 52 y.o. Hispanic male is seen in consultation at the request of Dr. Vernell Leep for recommendations on further evaluation and management of respiratory distress. The patient was admitted for treatment of multilobar pneumonia, AKI, abdominal pain and diarrhea. He presented with complaints of abdominal pain and diarrhea while on clindamycin, which apparently was prescribed for a toenail infection. He subsequently developed URI symptoms. He presented to the ER with fever 102.9 F, hypoxia (74%) and appeared toxic. During his hospitalization, he has been found to have a positive influenza screen.  Earlier today, he was on 5 LPM but progressively increased in FiO2 requirement, ending up on BiPAP. He reports that he could feel that it was helping but he has difficulty tolerating the positive pressure.   EVENTs 07/17/2017 - admit 07/19/17 - pccm consult   SUBJECTIVE/OVERNIGHT/INTERVAL HX Feeling better slowly. Now down to 4L [place] at rest, but has used BiPAP 3 times in last 24 hours.  VITAL SIGNS: BP (!) 162/86 (BP Location: Left Arm)   Pulse 75   Temp 98 F (36.7 C) (Oral)   Resp 18   Ht 5\' 5"  (1.651 m)   Wt 88 kg (194 lb 1.6 oz)   SpO2 94%   BMI 32.30 kg/m   HEMODYNAMICS:    VENTILATOR SETTINGS: FiO2 (%):  [40 %-45 %] 40 %  INTAKE / OUTPUT: I/O last 3 completed shifts: In: -  Out: 3225 [Urine:3225]  PHYSICAL EXAMINATION: General:  Middle aged male, overweight, no distress.  Neuro:  Alert, oriented, non-focal HEENT:  [place]/AT, No JVD noted, PERRL Cardiovascular:  RRR, no MRG Lungs:  Scattered rhonchi.  Abdomen:  Soft, non-distended Musculoskeletal:   No acute deformity Skin:  Intact, MMM   LABS: PULMONARY Recent Labs  Lab 07/19/17 1107  PHART 7.389  PCO2ART 39.1  PO2ART 53.4*  HCO3 23.1  O2SAT 86.4    CBC Recent Labs  Lab 07/19/17 0902 07/21/17 0441 07/22/17 0600  HGB 10.1* 10.1* 10.3*  HCT 31.0* 31.5* 31.4*  WBC 8.0 7.9 8.1  PLT 162 211 246    COAGULATION Recent Labs  Lab 07/18/17 0135  INR 1.06    CARDIAC  No results for input(s): TROPONINI in the last 168 hours. No results for input(s): PROBNP in the last 168 hours.   CHEMISTRY Recent Labs  Lab 07/17/17 0711 07/18/17 0135 07/19/17 0824 07/19/17 0902 07/21/17 0026 07/21/17 0441 07/22/17 0600  NA 131* 134*  --  136 136  --  137  K 3.5 3.4*  --  4.4 3.9  --  4.5  CL 95* 102  --  107 103  --  102  CO2 25 24  --  22 25  --  26  GLUCOSE 228* 218*  --  191* 142*  --  91  BUN 25* 23*  --  23* 16  --  13  CREATININE 1.40* 1.45*  --  1.12 1.07  --  1.03  CALCIUM 7.4* 6.7*  --  7.0* 7.5*  --  8.2*  MG  --   --  2.2  --   --  2.1 2.1  PHOS  --   --   --   --   --  3.5 4.1  Estimated Creatinine Clearance: 88.5 mL/min (by C-G formula based on SCr of 1.03 mg/dL).   LIVER Recent Labs  Lab 07/17/17 0711 07/18/17 0135 07/22/17 0600  AST 45* 34 41  ALT 37 27 45  ALKPHOS 63 53 88  BILITOT 0.9 0.5 0.4  PROT 5.7* 4.5* 5.6*  ALBUMIN 2.2* 1.7* 1.7*  INR  --  1.06  --      INFECTIOUS Recent Labs  Lab 07/17/17 0726 07/17/17 1246 07/18/17 0135 07/19/17 0824 07/20/17 0745  LATICACIDVEN 1.17 1.1 0.9  --   --   PROCALCITON  --  0.25  --  0.26 0.15     ENDOCRINE CBG (last 3)  Recent Labs    07/21/17 2118 07/22/17 0728 07/22/17 1118  GLUCAP 79 76 110*         IMAGING x48h  Dg Abd Portable 1v  Result Date: 07/20/2017 CLINICAL DATA:  Ileus.  History of diabetes. EXAM: PORTABLE ABDOMEN - 1 VIEW COMPARISON:  Chest radiograph 07/19/2017 FINDINGS: Gas is demonstrated within nondilated loops of large and small bowel. Supine evaluation  limited for the detection of free intraperitoneal air. Unremarkable osseous skeleton. IMPRESSION: Nonobstructed bowel gas pattern. Electronically Signed   By: Lovey Newcomer M.D.   On: 07/20/2017 17:49      ASSESSMENT / PLAN:  ASSESSMENT Acute resp failure with Flu A - improved but still persistent; This is due to flu A related acute lung injury and +/- volume overload. Less and less likely bacterial CAP.   PLAN Wean FiO2 for sats > 92% - currently on 4L HFNC. May need to go home on some O2 temporarily. Has still been using PRN BiPAP, will continue this for now.  Will repeat a chest Xray to evaluate for effusion/edema as described on prior studies. May indicate need for further diuresis. Continue tamiflu Continue azithro through 1/17  Will sign off  Georgann Housekeeper, AGACNP-BC [facility] Pulmonology/Critical Care Pager [phone] or [phone]  07/22/2017 1:54 PM

## 2017-07-22 NOTE — Progress Notes (Signed)
Patient sitting in bed resting. On 6 LPM [place] HFNC. No distressed noted. No accessory muscle use. Patient able to response in full sentences without SOB. Breathing treatment given at this time. RT will continue to monitor.

## 2017-07-22 NOTE — Progress Notes (Signed)
PROGRESS NOTE   Willie Jordan  IOX:[id number]    DOB: 09/17/1965    DOA: 07/17/2017  PCP: Mack Hook, MD   I have briefly reviewed patients previous medical records in [facility].  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  Admit 07/17/17 fever of 102.9, hypoxic to 74% on room air, tachypnea, ill-appearing. He was admitted with sepsis secondary to community-acquired multilobar pneumonia, acute kidney injury, abdominal pain and diarrhea. Ruled in for influenza A. Hypoxic requiring high flow nasal cannula oxygen and BiPAP overnight for last 2 nights. PCCM consulted 1/13.   Assessment & Plan:   Principal Problem:   Severe sepsis ([place]) Active Problems:   HLD (hyperlipidemia)   Essential hypertension   Type 2 diabetes mellitus with hyperglycemia (HCC)   Diabetic neuropathy (HCC)   Retinopathy due to secondary diabetes mellitus, with macular edema, with proliferative retinopathy ([place])   Community acquired bacterial pneumonia   Abdominal pain   Nausea and vomiting   AKI (acute kidney injury) ([place])   Hyponatremia   Acute respiratory failure with hypoxemia (HCC)   Severe sepsis Due to influenza  A acute bronchitis, CAP and possible community-acquired pneumonia. Rx  IV fluids and started on IV ceftriaxone and azithromycin. Sepsis improved. Blood cultures 2: Negative. MRSA PCR negative. Flu panel PCR positive for influenza A on 1/12. CCM suspects all related to influenza rather than pneumonia. Ceftriaxone discontinued 1/14-on  Azithro now--stop date 1/17--rpt 2 vw cxr 1/17 ? Community-acquired pneumonia: . Chest x-ray shows worsening of patchy bilateral pulmonary opacities. Needs follow-up chest x-ray in 4 weeks to insure clearing of pneumonia. Pro-calcitonin 0.26. As per PCCM Dr. Chase Caller, less likely to be bacterial pneumonia. Ceftriaxone discontinued. Continue azithromycin.  Strep PNA and Legionella are neg Influenza A with acute bronchitis: Started Tamiflu 07/18/17-stop 1/17. Supportive treatment. Acute decompensated diastolic HF, Grd 2 on Echo 1.14.19: +942 cc since admission. Stopped IV fluids. Rpt Lasix 40 bid IV and monitor ins and outs strict fluid restriction 1200 cc order placed--weight and I/o inaccurate and RN aware Acute respiratory failure with hypoxia 2/2 influenza A acute bronchitis with superimposed heart failure. Due to high oxygen requirement and nightly BiPAP use, CCM consulted 1/13. . Down to HFNC 8-->6 L/m. Continue oxygen, when necessary BiPAP. Wean as tol Acute kidney injury: Baseline normal creatinine. Presented with creatinine of 1.4. Resolved lasix resumed and changed as above Hypokalemia: Replaced. Diarrhea: Diarrhea resolved. Now constipated. Start MiraLAX. Dehydration with hyponatremia: Resolved after IV fluids, discontinued. Uncontrolled type II DM with peripheral neuropathy, nephropathy/proteinuria: A1c 7. Worsened by a dose of steroids that he received in ED. NovoLog 70/30 insulin  25 units twice a day and added SSI. hypogly to 66--now on 23 U bid-->20 with persisting sugars in 70's.  Cont gabapentin 300 tid for neuropathy Essential hypertension: Controlled. Continue amlodipine 5 daily Hyperlipidemia: Continue statins. Anemia: Follow CBCs and transfuse if hemoglobin less than 7 g per DL. Stable. Thrombocytopenia: Likely due to acute illness. Resolved. Nausea vomiting-started 1/14, has this every 2-3 months-?  Diabetic gastroparesis-improved   DVT prophylaxis: Lovenox. Code Status:  full  code.  Family Communication: None at bedside. Disposition: DC  home when medically improved.   Consultants:  PCCM  Procedures:  BiPAP when necessary  Antimicrobials:  IV ceftriaxone--stopped Tamiflu 1/12 Azithro 1/11   Subjective:  Better breathing less hard but still sob when eating [place] flow decreased I/o inaccurate   Objective:  Vitals:   07/21/17 2356 07/22/17 0000 07/22/17 0136 07/22/17 0751  BP:  (!) 164/83  (!) 162/86  Pulse:  72  75  Resp:  18    Temp:  97.9 F (36.6 C)  98 F (36.7 C)  TempSrc:  Oral  Oral  SpO2: 97% 98% 97% 94%  Weight:      Height:        Examination:  Pleasant awake alert not in distress on high flow cannula  no icterus no pallor no JVD S1-S2 slightly tachy no murmur Bi-basilar crackles Abdomen soft nontender no rebound no guarding Lower extremities -No lower extremity swelling    Data Reviewed: I have personally reviewed following labs and imaging studies  CBC: Recent Labs  Lab 07/17/17 0711 07/18/17 0135 07/19/17 0902 07/21/17 0441 07/22/17 0600  WBC 9.2 5.9 8.0 7.9 8.1  NEUTROABS 8.0*  --   --  5.7 5.8  HGB 11.4* 9.5* 10.1* 10.1* 10.3*  HCT 35.1* 28.7* 31.0* 31.5* 31.4*  MCV 82.2 83.2 82.4 82.7 82.0  PLT 171 139* 162 211 948   Basic Metabolic Panel: Recent Labs  Lab 07/17/17 0711 07/18/17 0135 07/19/17 0824 07/19/17 0902 07/21/17 0026 07/21/17 0441 07/22/17 0600  NA 131* 134*  --  136 136  --  137  K 3.5 3.4*  --  4.4 3.9  --  4.5  CL 95* 102  --  107 103  --  102  CO2 25 24  --  22 25  --  26  GLUCOSE 228* 218*  --  191* 142*  --  91  BUN 25* 23*  --  23* 16  --  13  CREATININE 1.40* 1.45*  --  1.12 1.07  --  1.03  CALCIUM 7.4* 6.7*  --  7.0* 7.5*  --  8.2*  MG  --   --  2.2  --   --  2.1 2.1  PHOS  --   --   --   --   --  3.5 4.1   Liver Function Tests: Recent Labs  Lab 07/17/17 0711 07/18/17 0135 07/22/17 0600  AST 45* 34 41  ALT 37 27 45  ALKPHOS 63 53 88  BILITOT 0.9 0.5 0.4  PROT  5.7* 4.5* 5.6*  ALBUMIN 2.2* 1.7* 1.7*   Coagulation Profile: Recent Labs  Lab 07/18/17 0135  INR 1.06   HbA1C: No results for input(s): HGBA1C in the last 72 hours. CBG: Recent Labs  Lab 07/21/17 0748 07/21/17 1131 07/21/17 1706 07/21/17 2118 07/22/17 0728  GLUCAP 110* 176* 156* 79 76    Recent Results (from the past 240 hour(s))  Blood Culture (routine x 2)     Status: None (Preliminary result)   Collection Time: 07/17/17  7:11 AM  Result Value Ref Range Status   Specimen Description BLOOD RIGHT ANTECUBITAL  Final   Special Requests   Final    BOTTLES DRAWN AEROBIC AND ANAEROBIC Blood Culture adequate volume   Culture NO GROWTH 4 DAYS  Final   Report Status PENDING  Incomplete  Urine Culture     Status: Abnormal   Collection Time: 07/17/17 11:05 AM  Result Value Ref Range Status   Specimen Description URINE, RANDOM  Final  Special Requests NONE  Final   Culture <10,000 COLONIES/mL INSIGNIFICANT GROWTH (A)  Final   Report Status 07/18/2017 FINAL  Final  Blood Culture (routine x 2)     Status: None (Preliminary result)   Collection Time: 07/17/17 12:53 PM  Result Value Ref Range Status   Specimen Description BLOOD BLOOD RIGHT WRIST  Final   Special Requests   Final    BOTTLES DRAWN AEROBIC AND ANAEROBIC Blood Culture adequate volume   Culture NO GROWTH 4 DAYS  Final   Report Status PENDING  Incomplete  MRSA PCR Screening     Status: None   Collection Time: 07/18/17  5:09 AM  Result Value Ref Range Status   MRSA by PCR NEGATIVE NEGATIVE Final    Comment:        The GeneXpert MRSA Assay (FDA approved for NASAL specimens only), is one component of a comprehensive MRSA colonization surveillance program. It is not intended to diagnose MRSA infection nor to guide or monitor treatment for MRSA infections.          Radiology Studies: Dg Abd Portable 1v  Result Date: 07/20/2017 CLINICAL DATA:  Ileus.  History of diabetes. EXAM: PORTABLE ABDOMEN - 1 VIEW  COMPARISON:  Chest radiograph 07/19/2017 FINDINGS: Gas is demonstrated within nondilated loops of large and small bowel. Supine evaluation limited for the detection of free intraperitoneal air. Unremarkable osseous skeleton. IMPRESSION: Nonobstructed bowel gas pattern. Electronically Signed   By: Lovey Newcomer M.D.   On: 07/20/2017 17:49        Scheduled Meds: . amLODipine  5 mg Oral Daily  . atorvastatin  40 mg Oral Daily  . azithromycin  500 mg Oral Daily  . budesonide (PULMICORT) nebulizer solution  0.25 mg Nebulization BID  . enoxaparin (LOVENOX) injection  40 mg Subcutaneous Q24H  . furosemide  40 mg Intravenous Q12H  . gabapentin  300 mg Oral TID  . insulin aspart  0-9 Units Subcutaneous TID WC  . insulin aspart protamine- aspart  23 Units Subcutaneous BID WC  . ipratropium-albuterol  3 mL Nebulization Q6H  . oseltamivir  75 mg Oral BID  . polyethylene glycol  17 g Oral Daily  . sodium chloride flush  3 mL Intravenous Q12H   Continuous Infusions:    LOS: 5 days     Verneita Griffes, MD [facility] ([phone]   If 7PM-7AM, please contact night-coverage [email or web address] Password [facility] 07/22/2017, 10:25 AM

## 2017-07-23 ENCOUNTER — Inpatient Hospital Stay (HOSPITAL_COMMUNITY): Payer: Self-pay

## 2017-07-23 LAB — GLUCOSE, CAPILLARY
GLUCOSE-CAPILLARY: 114 mg/dL — AB (ref 65–99)
GLUCOSE-CAPILLARY: 131 mg/dL — AB (ref 65–99)
GLUCOSE-CAPILLARY: 82 mg/dL (ref 65–99)
Glucose-Capillary: 114 mg/dL — ABNORMAL HIGH (ref 65–99)
Glucose-Capillary: 200 mg/dL — ABNORMAL HIGH (ref 65–99)

## 2017-07-23 LAB — MAGNESIUM: MAGNESIUM: 2 mg/dL (ref 1.7–2.4)

## 2017-07-23 LAB — PHOSPHORUS: PHOSPHORUS: 5.1 mg/dL — AB (ref 2.5–4.6)

## 2017-07-23 MED ORDER — FUROSEMIDE 40 MG PO TABS
40.0000 mg | ORAL_TABLET | Freq: Two times a day (BID) | ORAL | Status: DC
  Administered 2017-07-23 – 2017-07-24 (×3): 40 mg via ORAL
  Filled 2017-07-23 (×4): qty 1

## 2017-07-23 MED ORDER — IPRATROPIUM-ALBUTEROL 0.5-2.5 (3) MG/3ML IN SOLN
3.0000 mL | Freq: Three times a day (TID) | RESPIRATORY_TRACT | Status: DC
  Administered 2017-07-24 – 2017-07-27 (×9): 3 mL via RESPIRATORY_TRACT
  Filled 2017-07-23 (×10): qty 3

## 2017-07-23 MED ORDER — CYCLOBENZAPRINE HCL 10 MG PO TABS
5.0000 mg | ORAL_TABLET | Freq: Three times a day (TID) | ORAL | Status: AC
  Administered 2017-07-23 – 2017-07-24 (×4): 5 mg via ORAL
  Filled 2017-07-23 (×4): qty 1

## 2017-07-23 MED ORDER — ACETAMINOPHEN 500 MG PO TABS
500.0000 mg | ORAL_TABLET | Freq: Four times a day (QID) | ORAL | Status: DC | PRN
  Administered 2017-07-23 – 2017-07-25 (×3): 500 mg via ORAL
  Filled 2017-07-23 (×3): qty 1

## 2017-07-23 NOTE — Care Management Note (Addendum)
Case Management Note  Patient Details  Name: Willie Jordan MRN: [id number] Date of Birth: 10-May-1966  Subjective/Objective: Pt presented for Respiratory Distress- 2/2 Sepsis/ ? Pneumonia. Pt was initiated on BIPAP and transitioned to High Flow 02 now weaned from 6-8 L high flow down to 3 L High Flow. Plan now to wean further. Following for home 02 at this time. Pt is without Insurance-followed by the [facility]. CM did set up time for Interpreter to speak with pt in regards to disposition needs.                     Action/Plan: CM will continue to monitor for additional needs.   Expected Discharge Date:                  Expected Discharge Plan:  Home/Self Care  In-House Referral:  Interpreting Services, Development worker, community  Discharge planning Services  CM Consult  Post Acute Care Choice:  Durable Medical Equipment Choice offered to:  Patient  DME Arranged:  Oxygen DME Agency:   Other- See Comment-Pt did not qualify. 93 % lowest with ambulation  HH Arranged:   N/A HH Agency:   N/A  Status of Service:  COMPLETED If discussed at [place] of Stay Meetings, dates discussed:  07-23-17  Additional Comments: 1310 07-27-17 Jacqlyn Krauss, RN,BSN [phone] CM did ambulate patient. The lowest the sat's dropped were to 93%. Pt ambulated well without any SOB. Will document in pulmonary note. No need for DME 02. Staff RN to alert MD.   1243 07-27-17 Judieth Mckown Graves-Bigelow, RN,BSN [phone] CM did speak with MD in regards to 02 and pulmonary sat-Staff RN to ambulate patient again to make sure he will not need 02. CM did provide pt with information on where to get pulse oximeter. No further needs from CM at this time.   07-27-17 Jacqlyn Krauss, RN,BSN [phone] CM did contact [facility] to see if pt has a qualifying diagnosis with Hypoxemia. [facility] to call me back. CM will monitor- plan for d/c today.     1400 07-23-17 Jacqlyn Krauss, RN,BSN  [phone] CM did speak with pt / Interpreter in regards to disposition needs. Pt does not have a qualifying diagnosis for insurance to approve payment for Oxygen. Pt will have to pay out of pocket for concentrator @ $190.00 and $15.00 for each portable tank. PTA Independent and rents a room from a friend. Pt works at [employer] and has PCP at the [employer]. CM will continue to monitor.  Bethena Roys, RN 07/23/2017, 11:06 AM

## 2017-07-23 NOTE — Progress Notes (Signed)
PROGRESS NOTE   Willie Jordan  HDQ:[id number]    DOB: [birth year]    DOA: 07/17/2017  PCP: Mack Hook, MD   I have briefly reviewed patients previous medical records in [facility].  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  Admit 07/17/17 fever of 102.9, hypoxic to 74% on room air, tachypnea, ill-appearing. He was admitted with sepsis secondary to community-acquired multilobar pneumonia, acute kidney injury, abdominal pain and diarrhea. Ruled in for influenza A. Hypoxic requiring high flow nasal cannula oxygen and BiPAP overnight for last 2 nights. PCCM consulted 1/13.   Assessment & Plan:   Principal Problem:   Severe sepsis ([place]) Active Problems:   HLD (hyperlipidemia)   Essential hypertension   Type 2 diabetes mellitus with hyperglycemia (HCC)   Diabetic neuropathy (HCC)   Retinopathy due to secondary diabetes mellitus, with macular edema, with proliferative retinopathy ([place])   Community acquired bacterial pneumonia   Abdominal pain   Nausea and vomiting   AKI (acute kidney injury) ([place])   Hyponatremia   Acute respiratory failure with hypoxemia (HCC)   Severe sepsis Due to influenza 'A' acute bronchitis-possible LLL PNA Rx  Initially IV ceftriaxone and azithromycin.  improved. Blood cultures 2: Negative. MRSA PCR negative. Flu panel PCR + influenza A on 1/12.  Ceftriaxone discontinued 1/14-on Azithro now--stop date 1/17--rpt 2 vw cxr 1/17 shows possible LLL pna--finished 7 d abx Headache-tingling in head/hands  has no focal deficit.  Tender in occiput and relieved by traction-give flexeril and reassess ? Community-acquired pneumonia: . Chest x-ray as above. Pro-calcitonin 0.26.Strep PNA and Legionella are neg Influenza A with acute bronchitis: Started Tamiflu 07/18/17-stop 1/17. Supportive treatment. Acute decompensated diastolic HF, Grd 2 on Echo 1.14.19: +942 cc since admission. Stopped IV fluids. Resumed 1/16 Lasix 40 bid IV and monitor ins and outs strict fluid restriction 1200 cc order placed--I/o -6 liters-scale back to IV lasix 40 once daily-[po bid 40 from 1/17-desat screen, wean as tol Acute respiratory failure with hypoxia 2/2 influenza A acute bronchitis with superimposed heart failure. Due to high oxygen requirement and nightly BiPAP use, CCM consulted 1/13. Down to HFNC 8-->6 -->3L/m. Prn  BiPAP. Wean as tol Acute kidney injury: Baseline normal creatinine. Presented with creatinine of 1.4.  lasix resumed and changed as above Diarrhea: Diarrhea resolved. Now constipated. Start MiraLAX. Dehydration with hyponatremia: Resolved after IV fluids, discontinued. Hypokalemia: Replaced. Uncontrolled type II DM with peripheral neuropathy, nephropathy/proteinuria: A1c 7. Worsened by a dose of steroids that he received in ED. NovoLog 70/30 insulin  25 units twice a day and added SSI. hypogly to 66--now on 23 U bid-->20 U-suagrs 100 range-no hypo today  Cont gabapentin 300 tid for neuropathy Essential hypertension: Controlled. Continue amlodipine 5 daily Hyperlipidemia: Continue statins. Anemia: Follow CBCs and transfuse if hemoglobin less than 7 g per DL. Stable. Thrombocytopenia: Likely due to acute illness. Resolved. Nausea vomiting-started 1/14, has this every 2-3 months-?  Diabetic gastroparesis-improved   DVT prophylaxis: Lovenox. Code Status:  full code.  Family Communication: None at bedside. Disposition: DC home when medically improved.   Consultants:  PCCM  Procedures:  BiPAP when  necessary  Antimicrobials:  IV ceftriaxone--stopped Tamiflu 1/12-1/17 Azithro 1/11-1/17   Subjective:  Some vague Headache tingling and weakness in UE-headache radiates from neck to front of head Given tylenol which marginally helped No fever no chills no cough no cold Some ringing in ears   Objective:  Vitals:   07/23/17 0803 07/23/17 0826 07/23/17 0836 07/23/17 1349  BP: (!) 152/78 (!) 157/80    Pulse: 74     Resp: 20     Temp: 98.4 F (36.9 C)     TempSrc: Oral     SpO2: (!) 89%  93% 97%  Weight:      Height:       Examination:  Pleasant awake alert not in distress  no icterus no pallor no JVD at 30 deg S1-S2 slightly tachy no murmur Less crackles compared to prior Abdomen soft nontender no rebound no guarding Lower extremities -No lower extremity swelling Moving 4 limbs equally without defciit-power 5/5 tracks well with eyes, reflexes 2/3 tongue midline, smile symm shrug wnl,plantars down  Data Reviewed: I have personally reviewed following labs and imaging studies  CBC: Recent Labs  Lab 07/17/17 0711 07/18/17 0135 07/19/17 0902 07/21/17 0441 07/22/17 0600  WBC 9.2 5.9 8.0 7.9 8.1  NEUTROABS 8.0*  --   --  5.7 5.8  HGB 11.4* 9.5* 10.1* 10.1* 10.3*  HCT 35.1* 28.7* 31.0* 31.5* 31.4*  MCV 82.2 83.2 82.4 82.7 82.0  PLT 171 139* 162 211 166   Basic Metabolic Panel: Recent Labs  Lab 07/17/17 0711 07/18/17 0135 07/19/17 0824 07/19/17 0902 07/21/17 0026 07/21/17 0441 07/22/17 0600 07/23/17 0853  NA 131* 134*  --  136 136  --  137  --   K 3.5 3.4*  --  4.4 3.9  --  4.5  --   CL 95* 102  --  107 103  --  102  --   CO2 25 24  --  22 25  --  26  --   GLUCOSE 228* 218*  --  191* 142*  --  91  --   BUN 25* 23*  --  23* 16  --  13  --   CREATININE 1.40* 1.45*  --  1.12 1.07  --  1.03  --   CALCIUM 7.4* 6.7*  --  7.0* 7.5*  --  8.2*  --   MG  --   --  2.2  --   --  2.1 2.1 2.0  PHOS  --   --   --   --   --  3.5 4.1 5.1*   Liver Function  Tests: Recent Labs  Lab 07/17/17 0711 07/18/17 0135 07/22/17 0600  AST 45* 34 41  ALT 37 27 45  ALKPHOS 63 53 88  BILITOT 0.9 0.5 0.4  PROT 5.7* 4.5* 5.6*  ALBUMIN 2.2* 1.7* 1.7*   Coagulation Profile: Recent Labs  Lab 07/18/17 0135  INR 1.06   HbA1C: No results for input(s): HGBA1C in the last 72 hours. CBG: Recent Labs  Lab 07/22/17 1638 07/22/17 2150 07/23/17 0347 07/23/17 0736 07/23/17 1138  GLUCAP 120* 82 114* 114* 131*    Recent Results (from the past 240 hour(s))  Blood Culture (routine x 2)     Status: None   Collection Time: 07/17/17  7:11 AM  Result Value Ref Range Status   Specimen Description BLOOD RIGHT ANTECUBITAL  Final   Special Requests   Final    BOTTLES DRAWN AEROBIC AND ANAEROBIC Blood Culture adequate volume  Culture NO GROWTH 5 DAYS  Final   Report Status 07/22/2017 FINAL  Final  Urine Culture     Status: Abnormal   Collection Time: 07/17/17 11:05 AM  Result Value Ref Range Status   Specimen Description URINE, RANDOM  Final   Special Requests NONE  Final   Culture <10,000 COLONIES/mL INSIGNIFICANT GROWTH (A)  Final   Report Status 07/18/2017 FINAL  Final  Blood Culture (routine x 2)     Status: None   Collection Time: 07/17/17 12:53 PM  Result Value Ref Range Status   Specimen Description BLOOD BLOOD RIGHT WRIST  Final   Special Requests   Final    BOTTLES DRAWN AEROBIC AND ANAEROBIC Blood Culture adequate volume   Culture NO GROWTH 5 DAYS  Final   Report Status 07/22/2017 FINAL  Final  MRSA PCR Screening     Status: None   Collection Time: 07/18/17  5:09 AM  Result Value Ref Range Status   MRSA by PCR NEGATIVE NEGATIVE Final    Comment:        The GeneXpert MRSA Assay (FDA approved for NASAL specimens only), is one component of a comprehensive MRSA colonization surveillance program. It is not intended to diagnose MRSA infection nor to guide or monitor treatment for MRSA infections.          Radiology Studies: Dg  Chest 2 View  Result Date: 07/23/2017 CLINICAL DATA:  Pneumonia, former smoker. History of diabetes and hypertension. EXAM: CHEST  2 VIEW COMPARISON:  Chest x-ray of July 19, 2017 FINDINGS: The lungs are adequately inflated. The interstitial markings remain increased. There is a confluent airspace opacity present lateral to the cardiac apex more conspicuous than on the previous study. Alveolar opacities in the left upper lobe and right mid and upper lung are slightly less conspicuous. The heart and pulmonary vascularity are normal. The bony thorax is unremarkable. IMPRESSION: Left lower lobe pneumonia more conspicuous today. Decreased conspicuity of airspace opacities in the upper lobe. Trace bilateral pleural effusions. Electronically Signed   By: David  Martinique M.D.   On: 07/23/2017 09:17        Scheduled Meds: . amLODipine  5 mg Oral Daily  . atorvastatin  40 mg Oral Daily  . azithromycin  500 mg Oral Daily  . budesonide (PULMICORT) nebulizer solution  0.25 mg Nebulization BID  . enoxaparin (LOVENOX) injection  40 mg Subcutaneous Q24H  . furosemide  40 mg Intravenous Q12H  . gabapentin  300 mg Oral TID  . insulin aspart  0-9 Units Subcutaneous TID WC  . insulin aspart protamine- aspart  20 Units Subcutaneous BID WC  . ipratropium-albuterol  3 mL Nebulization Q6H  . polyethylene glycol  17 g Oral Daily  . sodium chloride flush  3 mL Intravenous Q12H   Continuous Infusions:    LOS: 6 days     Verneita Griffes, MD [facility] ([phone]   If 7PM-7AM, please contact night-coverage [email or web address] Password [email or web address] 07/23/2017, 1:59 PM

## 2017-07-23 NOTE — Progress Notes (Signed)
RT placed patient on BIPAP HS. Patient tolerating well at this time.  

## 2017-07-24 LAB — GLUCOSE, CAPILLARY
GLUCOSE-CAPILLARY: 122 mg/dL — AB (ref 65–99)
Glucose-Capillary: 102 mg/dL — ABNORMAL HIGH (ref 65–99)
Glucose-Capillary: 171 mg/dL — ABNORMAL HIGH (ref 65–99)
Glucose-Capillary: 192 mg/dL — ABNORMAL HIGH (ref 65–99)

## 2017-07-24 LAB — CREATININE, SERUM
CREATININE: 1.18 mg/dL (ref 0.61–1.24)
GFR calc Af Amer: >60 mL/min (ref >60)
GFR calc non Af Amer: >60 mL/min (ref >60)

## 2017-07-24 LAB — PHOSPHORUS: Phosphorus: 4.3 mg/dL (ref 2.5–4.6)

## 2017-07-24 LAB — MAGNESIUM: Magnesium: 2.1 mg/dL (ref 1.7–2.4)

## 2017-07-24 NOTE — Progress Notes (Signed)
Ambulated patient approximately 156ft. This evening. At rest, pt was 90-91% on 2L of humidified O2. Pt started walking and immediately dropped to 83% on RA. Placed  patient on 2L [place], O2 sats at 85%. Continued walking and placed pt at 4L, O2 sats at remained in the 8o's. Pt back in bed and placed back on humidified oxygen, O2 sats recovered back to the mid 90's. Pt very discouraged with these findings. Pt educated and emotionally supported. Will continue to monitor.  Jacqlyn Larsen, RN

## 2017-07-24 NOTE — Progress Notes (Signed)
PROGRESS NOTE   Willie Jordan  VEL:[id number]    DOB: [birth year]    DOA: 07/17/2017  PCP: Mack Hook, MD   I have briefly reviewed patients previous medical records in [facility].  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  Admit 07/17/17 fever of 102.9, hypoxic to 74% on room air, tachypnea, ill-appearing. He was admitted with sepsis secondary to community-acquired multilobar pneumonia, acute kidney injury, abdominal pain and diarrhea. Ruled in for influenza A. Hypoxic requiring high flow nasal cannula oxygen and BiPAP overnight for last 2 nights. PCCM consulted 1/13.   Assessment & Plan:   Principal Problem:   Severe sepsis ([place]) Active Problems:   HLD (hyperlipidemia)   Essential hypertension   Type 2 diabetes mellitus with hyperglycemia (HCC)   Diabetic neuropathy (HCC)   Retinopathy due to secondary diabetes mellitus, with macular edema, with proliferative retinopathy ([place])   Community acquired bacterial pneumonia   Abdominal pain   Nausea and vomiting   AKI (acute kidney injury) ([place])   Hyponatremia   Acute respiratory failure with hypoxemia (HCC)   Severe sepsis Due to influenza 'A' acute bronchitis-possible LLL PNA Rx  Initially IV ceftriaxone and azithromycin.  improved. Blood cultures 2: Negative. MRSA PCR negative. Flu panel PCR + influenza A on 1/12.  Ceftriaxone discontinued 1/14-on Azithro now--stop date 1/17--rpt 2 vw cxr 1/17 shows possible LLL pna--finished 7 d abx-no further abx  needed Headache-tingling in head/hands has no focal deficit.  Tender in occiput and relieved by traction-give flexeril and reassess--improved-think related to bipap machine-d/c order and observe only on nasal o2 ? Community-acquired pneumonia: . Chest x-ray as above. Pro-calcitonin 0.26.Strep PNA and Legionella are neg Influenza A with acute bronchitis: Started Tamiflu 07/18/17-stop 1/17. Supportive treatment. Acute decompensated diastolic HF, Grd 2 on Echo 1.14.19: +942 cc since admission. Stopped IV fluids. Resumed 1/16 Lasix 40 bid IV -fluid restriction 1200 cc order placed--I/o -8.6 liters-scale back to IV lasix 40 once daily-po bid 40 from 1/17-desat screen, wean as tol Acute respiratory failure with hypoxia 2/2 influenza A acute bronchitis with superimposed heart failure. Due to high oxygen requirement and nightly BiPAP use, CCM consulted 1/13. Down to HFNC 8-->6 -->3-->2L/m.bipap d/c. Wean as tol--ambualtory sats with and without oxygen-is improved and nearing d/c withint 2-3 d Acute kidney injury: Baseline normal creatinine. Presented with creatinine of 1.4.--1.1 Diarrhea: Diarrhea resolved. Now constipated. Start MiraLAX. Dehydration with hyponatremia: Resolved after IV fluids, discontinued. Hypokalemia: Replaced. Uncontrolled type II DM with peripheral neuropathy, nephropathy/proteinuria: A1c 7. Worsened by a dose of steroids that he received in ED. NovoLog 70/30 insulin  25 units twice a day and added SSI. hypogly to 66--now on 23 U bid-->20 U-suagrs 100 range-sugars 102-171  Cont gabapentin 300 tid for neuropathy Essential hypertension: Controlled. Continue amlodipine 5 daily Hyperlipidemia: Continue statins. Anemia: Follow CBCs and transfuse if hemoglobin less than 7 g per DL. Stable. Thrombocytopenia: Likely due to acute illness. Resolved. Nausea vomiting-started 1/14, has this every 2-3 months-?  Diabetic gastroparesis-improved   DVT prophylaxis: Lovenox. Code Status:  full code.   Family Communication: None at bedside. Disposition: DC home when medically improved.  Consultants:  PCCM  Procedures:  BiPAP when necessary  Antimicrobials:  IV ceftriaxone--stopped Tamiflu 1/12-1/17 Azithro 1/11-1/17   Subjective:  Headache better in no distress now needing less and less oxygen No cp no fever no cough no sputum  Objective:  Vitals:   07/24/17 0738 07/24/17 0746 07/24/17 0924 07/24/17 1210  BP:  136/80  131/67  Pulse: 69 71  73  Resp: 16 18  20   Temp:  98.4 F (36.9 C)  98.5 F (36.9 C)  TempSrc:  Oral  Oral  SpO2: 95% 93% 93% (!) 89%  Weight:      Height:       Examination:   not in distress   no JVD at 30 deg S1-S2 slightly tachy no murmur Less crackles compared to prior Abdomen soft nt nd no rebound Lower extremities -No lower extremity swelling Moving 4 limbs equally without defciit-power 5/5 tracks well with eyes, reflexes 2/3 tongue midline, smile symm shrug wnl,plantars down  Data Reviewed: I have personally reviewed following labs and imaging studies  CBC: Recent Labs  Lab 07/18/17 0135 07/19/17 0902 07/21/17 0441 07/22/17 0600  WBC 5.9 8.0 7.9 8.1  NEUTROABS  --   --  5.7 5.8  HGB 9.5* 10.1* 10.1* 10.3*  HCT 28.7* 31.0* 31.5* 31.4*  MCV 83.2 82.4 82.7 82.0  PLT 139* 162 211 093   Basic Metabolic Panel: Recent Labs  Lab 07/18/17 0135 07/19/17 0824 07/19/17 0902 07/21/17 0026 07/21/17 0441 07/22/17 0600 07/23/17 0853 07/24/17 0523  NA 134*  --  136 136  --  137  --   --   K 3.4*  --  4.4 3.9  --  4.5  --   --   CL 102  --  107 103  --  102  --   --   CO2 24  --  22 25  --  26  --   --   GLUCOSE 218*  --  191* 142*  --  91  --   --   BUN 23*  --  23* 16  --  13  --   --   CREATININE 1.45*  --  1.12 1.07  --  1.03  --  1.18  CALCIUM 6.7*  --  7.0* 7.5*  --  8.2*  --   --   MG  --  2.2  --   --  2.1 2.1 2.0 2.1  PHOS  --   --   --   --  3.5 4.1 5.1* 4.3   Liver Function Tests: Recent Labs  Lab 07/18/17 0135  07/22/17 0600  AST 34 41  ALT 27 45  ALKPHOS 53 88  BILITOT 0.5 0.4  PROT 4.5* 5.6*  ALBUMIN 1.7* 1.7*   Coagulation Profile: Recent Labs  Lab 07/18/17 0135  INR 1.06   HbA1C: No results for input(s): HGBA1C in the last 72 hours. CBG: Recent Labs  Lab 07/23/17 1138 07/23/17 1629 07/23/17 2233 07/24/17 0751 07/24/17 1209  GLUCAP 131* 200* 82 102* 171*    Recent Results (from the past 240 hour(s))  Blood Culture (routine x 2)     Status: None   Collection Time: 07/17/17  7:11 AM  Result Value Ref Range Status   Specimen Description BLOOD RIGHT ANTECUBITAL  Final   Special Requests   Final    BOTTLES DRAWN AEROBIC AND ANAEROBIC Blood Culture adequate volume   Culture NO GROWTH 5 DAYS  Final   Report Status 07/22/2017 FINAL  Final  Urine Culture     Status: Abnormal   Collection Time: 07/17/17 11:05 AM  Result Value Ref Range Status   Specimen Description URINE, RANDOM  Final   Special Requests NONE  Final   Culture <10,000 COLONIES/mL INSIGNIFICANT GROWTH (A)  Final   Report Status 07/18/2017 FINAL  Final  Blood Culture (routine x 2)     Status: None   Collection Time: 07/17/17 12:53 PM  Result Value Ref Range Status   Specimen Description BLOOD BLOOD RIGHT WRIST  Final   Special Requests   Final    BOTTLES DRAWN AEROBIC AND ANAEROBIC Blood Culture adequate volume   Culture NO GROWTH 5 DAYS  Final   Report Status 07/22/2017 FINAL  Final  MRSA PCR Screening     Status: None   Collection Time: 07/18/17  5:09 AM  Result Value Ref Range Status   MRSA by PCR NEGATIVE NEGATIVE Final    Comment:        The GeneXpert MRSA Assay (FDA approved for NASAL specimens only), is one component of a comprehensive MRSA colonization surveillance program. It is not intended to diagnose MRSA infection nor to guide or monitor treatment for MRSA infections.          Radiology Studies: Dg Chest 2 View  Result Date: 07/23/2017 CLINICAL DATA:  Pneumonia, former smoker.  History of diabetes and hypertension. EXAM: CHEST  2 VIEW COMPARISON:  Chest x-ray of July 19, 2017 FINDINGS: The lungs are adequately inflated. The interstitial markings remain increased. There is a confluent airspace opacity present lateral to the cardiac apex more conspicuous than on the previous study. Alveolar opacities in the left upper lobe and right mid and upper lung are slightly less conspicuous. The heart and pulmonary vascularity are normal. The bony thorax is unremarkable. IMPRESSION: Left lower lobe pneumonia more conspicuous today. Decreased conspicuity of airspace opacities in the upper lobe. Trace bilateral pleural effusions. Electronically Signed   By: David  Martinique M.D.   On: 07/23/2017 09:17   Scheduled Meds: . amLODipine  5 mg Oral Daily  . atorvastatin  40 mg Oral Daily  . budesonide (PULMICORT) nebulizer solution  0.25 mg Nebulization BID  . cyclobenzaprine  5 mg Oral TID  . enoxaparin (LOVENOX) injection  40 mg Subcutaneous Q24H  . furosemide  40 mg Oral BID  . gabapentin  300 mg Oral TID  . insulin aspart  0-9 Units Subcutaneous TID WC  . insulin aspart protamine- aspart  20 Units Subcutaneous BID WC  . ipratropium-albuterol  3 mL Nebulization TID  . polyethylene glycol  17 g Oral Daily  . sodium chloride flush  3 mL Intravenous Q12H   Continuous Infusions:    LOS: 7 days     Verneita Griffes, MD [facility] ([phone]   If 7PM-7AM, please contact night-coverage [email or web address] Password [email or web address] 07/24/2017, 2:18 PM

## 2017-07-24 NOTE — Progress Notes (Signed)
Around 0200, pt took off BiPAP and requested to be placed on oxygen via [place]. Around 0600 pt began to desat with 3L of oxygen on. Pt stated he took his oxygen off for approx 5-10 mins because it was hurting his head. Pt educated on the importance of wearing his oxygen. Pt had to be placed back on HiFlow at 5L to maintain sats at 92%. Lungs with crackles in the left base. Urinal with 600cc of urine at bedside. Pt c/o of HA; will administer PRN tylenol. Pt due for PO lasix at 0700, will administer and continue to monitor closely.  Jacqlyn Larsen, RN]

## 2017-07-25 LAB — GLUCOSE, CAPILLARY
GLUCOSE-CAPILLARY: 307 mg/dL — AB (ref 65–99)
Glucose-Capillary: 150 mg/dL — ABNORMAL HIGH (ref 65–99)
Glucose-Capillary: 183 mg/dL — ABNORMAL HIGH (ref 65–99)
Glucose-Capillary: 155 mg/dL — ABNORMAL HIGH (ref 65–99)

## 2017-07-25 MED ORDER — FUROSEMIDE 10 MG/ML IJ SOLN
60.0000 mg | Freq: Two times a day (BID) | INTRAMUSCULAR | Status: DC
  Administered 2017-07-25 – 2017-07-26 (×3): 60 mg via INTRAVENOUS
  Filled 2017-07-25 (×3): qty 6

## 2017-07-25 MED ORDER — PHENOL 1.4 % MT LIQD
1.0000 | OROMUCOSAL | Status: DC | PRN
  Administered 2017-07-25: 1 via OROMUCOSAL
  Filled 2017-07-25: qty 177

## 2017-07-25 MED ORDER — IPRATROPIUM-ALBUTEROL 0.5-2.5 (3) MG/3ML IN SOLN
3.0000 mL | Freq: Once | RESPIRATORY_TRACT | Status: AC
  Administered 2017-07-25: 3 mL via RESPIRATORY_TRACT
  Filled 2017-07-25: qty 3

## 2017-07-25 MED ORDER — DM-GUAIFENESIN ER 30-600 MG PO TB12
1.0000 | ORAL_TABLET | Freq: Two times a day (BID) | ORAL | Status: DC | PRN
  Administered 2017-07-25: 1 via ORAL
  Filled 2017-07-25: qty 1

## 2017-07-25 MED ORDER — FUROSEMIDE 10 MG/ML IJ SOLN
60.0000 mg | Freq: Once | INTRAMUSCULAR | Status: AC
  Administered 2017-07-25: 60 mg via INTRAVENOUS
  Filled 2017-07-25: qty 6

## 2017-07-25 NOTE — Plan of Care (Signed)
Ambulates with O2

## 2017-07-25 NOTE — Progress Notes (Signed)
Responded to pt's desat alarm, pt 81% on 4L HFNC. Increased pt's oxygen to 8L HFNC, pt O2 increased to 93%. Lungs with crackles in the lower bases. Pt with decent UOP so far this shift. Paged [facility] for orders. Orders received for neb tx x1, 60mg  of IV lasix, and BiPAP. Pt does not appear to be in respiratory distress at this time. Will follow orders and continue to monitor closely.  Jacqlyn Larsen

## 2017-07-25 NOTE — Plan of Care (Signed)
Wean O2 to room air. Will need Oxygen sats with ambulation.

## 2017-07-25 NOTE — Progress Notes (Signed)
PROGRESS NOTE   Willie Jordan  YYQ:[id number]    DOB: 05-Jan-1966    DOA: 07/17/2017  PCP: Mack Hook, MD   I have briefly reviewed patients previous medical records in [facility].  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  Admit 07/17/17 fever of 102.9, hypoxic to 74% on room air, tachypnea, ill-appearing. He was admitted with sepsis secondary to community-acquired multilobar pneumonia, acute kidney injury, abdominal pain and diarrhea. Ruled in for influenza A. Hypoxic requiring high flow nasal cannula oxygen and BiPAP overnight for last 2 nights. PCCM consulted 1/13.   Assessment & Plan:   Principal Problem:   Severe sepsis ([place]) Active Problems:   HLD (hyperlipidemia)   Essential hypertension   Type 2 diabetes mellitus with hyperglycemia (HCC)   Diabetic neuropathy (HCC)   Retinopathy due to secondary diabetes mellitus, with macular edema, with proliferative retinopathy ([place])   Community acquired bacterial pneumonia   Abdominal pain   Nausea and vomiting   AKI (acute kidney injury) ([place])   Hyponatremia   Acute respiratory failure with hypoxemia (HCC)   Severe sepsis Due to influenza 'A' acute bronchitis-possible LLL PNA Rx  Initially IV ceftriaxone and azithromycin.  improved. Blood cultures 2: Negative. MRSA PCR negative. Flu panel PCR + influenza A on 1/12.  Ceftriaxone discontinued 1/14-on Azithro stop 1/17--rpt 2 vw cxr 1/17 shows possible LLL pna--finished 7 d abx-no further abx needed Headache-tingling in  head/hands has no focal deficit.  Tender in occiput and relieved by traction-give flexeril and reassess--still in am-no focal deficit-no work-up  ? Community-acquired pneumonia: . Chest x-ray as above. Pro-calcitonin 0.26.Strep PNA and Legionella are neg Influenza A with acute bronchitis: Started Tamiflu 07/18/17-stop 1/17. Supportive treatment. Acute decompensated diastolic HF, Grd 2 on Echo 1.14.19: -10 liter-place back however given desat 1/19 on lasix iv 60 bid- wa son po.  Will need IV lasix for the next few days Acute respiratory failure with hypoxia 2/2 influenza A acute bronchitis with superimposed heart failure.off bipap but desat-CCM consulted 1/13. Down to HFNC 8-->6 -->3-->2L/m.bipap d/c and attempt not to use for now. Wean as tol-- oxygen and ambulate without the same--will probably need at least home oxygen on d/c Acute kidney injury: Baseline normal creatinine. Presented with creatinine of 1.4.--1.1 Diarrhea: Diarrhea resolved. Now constipated. Start MiraLAX. Dehydration with hyponatremia: Resolved after IV fluids, discontinued. Hypokalemia: Replaced. Uncontrolled type II DM with peripheral neuropathy, nephropathy/proteinuria: A1c 7. Worsened by a dose of steroids that he received in ED. NovoLog 70/30 insulin  25 units twice a day and added SSI. hypogly to 66--now on 23 U bid-->20 U-suagrs 100 range-sugars 122-192 Cont gabapentin 300 tid for neuropathy Essential hypertension: Controlled moderaely Continue amlodipine 5 daily Hyperlipidemia: Continue statins. Anemia: Follow CBCs and transfuse if hemoglobin less than 7 g per DL. Stable. Thrombocytopenia: Likely due to acute illness. Resolved. Nausea vomiting-started 1/14, has this every 2-3 months-?  Diabetic gastroparesis-improved   DVT prophylaxis: Lovenox. Code Status:  full code.  Family Communication: d.w wife 1/17 none present today Disposition: DC home when medically improved--unclear dipso still desats and still has evidence  HF-adjusting meds as above   Consultants:  PCCM  Procedures:  BiPAP when necessary  Antimicrobials:  IV ceftriaxone--stopped Tamiflu 1/12-1/17 Azithro 1/11-1/17   Subjective:  Headache mainl in am-radiate from back of head to front.  No cp Note events overnight-desat s well as sob and needing iv lasix  Objective:  Vitals:   07/24/17 2333 07/25/17 0300 07/25/17 0515 07/25/17 0805  BP: (!) 152/82  137/77 132/72  Pulse: 79  81 79  Resp: (!) 21  19 18   Temp: 98.2 F (36.8 C)  98.4 F (36.9 C) 98.3 F (36.8 C)  TempSrc: Oral  Oral Oral  SpO2: 94% 92% 97% 98%  Weight:   76.8 kg (169 lb 6.4 oz)   Height:       Examination:   not in distress   no JVD at 30 deg S1-S2 slightly tachy no murmur Still crackles-about the same as prior Abdomen soft nt nd no rebound Lower extremities -No lower extremity swelling Moving 4 limbs equally without defciit-power 5/5, reflexes 2/3 tongue midline, smile symm shrug wnl,plantars down  Data Reviewed: I have personally reviewed following labs and imaging studies  CBC: Recent Labs  Lab 07/19/17 0902 07/21/17 0441 07/22/17 0600  WBC 8.0 7.9 8.1  NEUTROABS  --  5.7 5.8  HGB 10.1* 10.1* 10.3*  HCT 31.0* 31.5* 31.4*  MCV 82.4 82.7 82.0  PLT 162 211 409   Basic Metabolic Panel: Recent Labs  Lab 07/19/17 0824 07/19/17 0902 07/21/17 0026 07/21/17 0441 07/22/17 0600 07/23/17 0853 07/24/17 0523  NA  --  136 136  --  137  --   --   K  --  4.4 3.9  --  4.5  --   --   CL  --  107 103  --  102  --   --   CO2  --  22 25  --  26  --   --   GLUCOSE  --  191* 142*  --  91  --   --   BUN  --  23* 16  --  13  --   --   CREATININE  --  1.12 1.07  --  1.03  --  1.18  CALCIUM  --  7.0* 7.5*  --  8.2*  --   --   MG 2.2  --   --  2.1 2.1 2.0 2.1  PHOS  --   --   --  3.5 4.1 5.1* 4.3   Liver Function Tests: Recent Labs  Lab 07/22/17 0600  AST 41  ALT 45  ALKPHOS 88  BILITOT 0.4  PROT 5.6*  ALBUMIN 1.7*   Coagulation  Profile: No results for input(s): INR, PROTIME in the last 168 hours. HbA1C: No results for input(s): HGBA1C in the last 72 hours. CBG: Recent Labs  Lab 07/24/17 0751 07/24/17 1209 07/24/17 1716 07/24/17 2126 07/25/17 0722  GLUCAP 102* 171* 192* 122* 150*    Recent Results (from the past 240 hour(s))  Blood Culture (routine x 2)     Status: None   Collection Time: 07/17/17  7:11 AM  Result Value Ref Range Status   Specimen Description BLOOD RIGHT ANTECUBITAL  Final   Special Requests   Final    BOTTLES DRAWN AEROBIC AND ANAEROBIC Blood Culture adequate volume   Culture NO GROWTH 5 DAYS  Final   Report Status 07/22/2017 FINAL  Final  Urine Culture     Status: Abnormal   Collection Time: 07/17/17 11:05 AM  Result Value Ref Range Status  Specimen Description URINE, RANDOM  Final   Special Requests NONE  Final   Culture <10,000 COLONIES/mL INSIGNIFICANT GROWTH (A)  Final   Report Status 07/18/2017 FINAL  Final  Blood Culture (routine x 2)     Status: None   Collection Time: 07/17/17 12:53 PM  Result Value Ref Range Status   Specimen Description BLOOD BLOOD RIGHT WRIST  Final   Special Requests   Final    BOTTLES DRAWN AEROBIC AND ANAEROBIC Blood Culture adequate volume   Culture NO GROWTH 5 DAYS  Final   Report Status 07/22/2017 FINAL  Final  MRSA PCR Screening     Status: None   Collection Time: 07/18/17  5:09 AM  Result Value Ref Range Status   MRSA by PCR NEGATIVE NEGATIVE Final    Comment:        The GeneXpert MRSA Assay (FDA approved for NASAL specimens only), is one component of a comprehensive MRSA colonization surveillance program. It is not intended to diagnose MRSA infection nor to guide or monitor treatment for MRSA infections.          Radiology Studies: No results found. Scheduled Meds: . amLODipine  5 mg Oral Daily  . atorvastatin  40 mg Oral Daily  . budesonide (PULMICORT) nebulizer solution  0.25 mg Nebulization BID  . enoxaparin  (LOVENOX) injection  40 mg Subcutaneous Q24H  . furosemide  40 mg Oral BID  . gabapentin  300 mg Oral TID  . insulin aspart  0-9 Units Subcutaneous TID WC  . insulin aspart protamine- aspart  20 Units Subcutaneous BID WC  . ipratropium-albuterol  3 mL Nebulization TID  . polyethylene glycol  17 g Oral Daily  . sodium chloride flush  3 mL Intravenous Q12H   Continuous Infusions:    LOS: 8 days     Verneita Griffes, MD [facility] ([phone]   If 7PM-7AM, please contact night-coverage [email or web address] Password [email or web address] 07/25/2017, 8:29 AM

## 2017-07-26 ENCOUNTER — Inpatient Hospital Stay (HOSPITAL_COMMUNITY): Payer: Self-pay

## 2017-07-26 LAB — BASIC METABOLIC PANEL
Anion gap: 10 (ref 5–15)
BUN: 21 mg/dL — AB (ref 6–20)
CALCIUM: 8.4 mg/dL — AB (ref 8.9–10.3)
CHLORIDE: 101 mmol/L (ref 101–111)
CO2: 27 mmol/L (ref 22–32)
CREATININE: 1.32 mg/dL — AB (ref 0.61–1.24)
GFR calc Af Amer: >60 mL/min (ref >60)
GFR calc non Af Amer: >60 mL/min (ref >60)
Glucose, Bld: 145 mg/dL — ABNORMAL HIGH (ref 65–99)
Potassium: 4.2 mmol/L (ref 3.5–5.1)
SODIUM: 138 mmol/L (ref 135–145)

## 2017-07-26 LAB — GLUCOSE, CAPILLARY
GLUCOSE-CAPILLARY: 126 mg/dL — AB (ref 65–99)
Glucose-Capillary: 283 mg/dL — ABNORMAL HIGH (ref 65–99)
Glucose-Capillary: 300 mg/dL — ABNORMAL HIGH (ref 65–99)
Glucose-Capillary: 177 mg/dL — ABNORMAL HIGH (ref 65–99)

## 2017-07-26 MED ORDER — FUROSEMIDE 40 MG PO TABS
40.0000 mg | ORAL_TABLET | Freq: Two times a day (BID) | ORAL | Status: DC
  Administered 2017-07-27: 40 mg via ORAL
  Filled 2017-07-26: qty 1

## 2017-07-26 NOTE — Progress Notes (Signed)
Patient was ambulated down the hall w/o O2, saturation levels went down to 84%.  While seating half way, patient had symptoms of blurred vision and saturation levels slowly picked to 92%.  Patient did not feel lightheadedness or sob.

## 2017-07-26 NOTE — Progress Notes (Signed)
Patient's desat to 81% while asleep, nasal cannula had been taken off by mistake when sleeping.  Tele contacted me about this episode.  I was able to put [name] back on patient and he quickly came up to 92%.

## 2017-07-26 NOTE — Progress Notes (Signed)
PROGRESS NOTE   Willie Jordan  LOV:[id number]    DOB: 1966-06-16    DOA: 07/17/2017  PCP: Mack Hook, MD   I have briefly reviewed patients previous medical records in [facility].  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  Admit 07/17/17 fever of 102.9, hypoxic to 74% on room air, tachypnea, ill-appearing. He was admitted with sepsis secondary to community-acquired multilobar pneumonia, acute kidney injury, abdominal pain and diarrhea. Ruled in for influenza A. Hypoxic requiring high flow nasal cannula oxygen and BiPAP overnight for last 2 nights. PCCM consulted 1/13.   Assessment & Plan:   Principal Problem:   Severe sepsis ([place]) Active Problems:   HLD (hyperlipidemia)   Essential hypertension   Type 2 diabetes mellitus with hyperglycemia (HCC)   Diabetic neuropathy (HCC)   Retinopathy due to secondary diabetes mellitus, with macular edema, with proliferative retinopathy ([place])   Community acquired bacterial pneumonia   Abdominal pain   Nausea and vomiting   AKI (acute kidney injury) ([place])   Hyponatremia   Acute respiratory failure with hypoxemia (HCC)   Severe sepsis Due to influenza 'A' acute bronchitis-possible LLL PNA Rx  Initially IV ceftriaxone and azithromycin.  improved. Blood cultures 2: Negative. MRSA PCR negative. Flu panel PCR + influenza A on 1/12.  Ceftriaxone discontinued 1/14-on Azithro stop 1/17--rpt 2 vw cxr 1/17 shows possible LLL pna--finished 7 d abx-no further abx needed Headache-tingling in head/hands has no focal  deficit.  Tender in occiput and relieved by traction-much beter with mucinex and chloraseptic ? Community-acquired pneumonia: . Chest x-ray as above. Pro-calcitonin 0.26.Strep PNA and Legionella are neg Influenza A with acute bronchitis: Started Tamiflu 07/18/17-stop 1/17. Supportive treatment. Acute decompensated diastolic HF, Grd 2 on Echo 1.14.19: -10 liter-place back however given desat 1/19 on lasix iv 60 bid- was on po.  Will need IV lasix for the next few days-getting CXR to r/o any more fluid Acute respiratory failure with hypoxia 2/2 influenza A acute bronchitis with superimposed heart failure.off bipap but desat-CCM consulted 1/13. Down to HFNC 8-->6 -->3-->2L/m bipap d/c and attempt not to use for now. Wean as tol-- oxygen and ambulate without the same--will probably need at least home oxygen on d/c  Acute kidney injury: Baseline normal creatinine. Presented with creatinine of 1.4.--1.1-await renal panel from this am Diarrhea: Diarrhea resolved. Now constipated. Start MiraLAX. Hypokalemia: Replaced. Uncontrolled type II DM with peripheral neuropathy, nephropathy/proteinuria: A1c 7. Worsened by a dose of steroids that he received in ED. NovoLog 70/30 insulin  25 units twice a day and added SSI. hypogly to 66--now on 23 U bid-->20 U-suagrs 100 range-sugars 126-155 Cont gabapentin 300 tid for neuropathy Essential hypertension: Controlled moderately Continue amlodipine 5 daily Hyperlipidemia: Continue statins. Anemia: Follow CBCs and transfuse if hemoglobin less than 7 g per DL. Stable. Thrombocytopenia: Likely due to acute illness. Resolved. Nausea vomiting-started 1/14, has this every 2-3 months-?  Diabetic gastroparesis-improved   DVT prophylaxis: Lovenox. Code Status:  full code.  Family Communication: d.w wife 1/17 none present today Disposition: DC home when medically improved--not ready for d/c today   Consultants:  PCCM  Procedures:  BiPAP when necessary  Antimicrobials:    IV ceftriaxone--stopped Tamiflu 1/12-1/17 Azithro 1/11-1/17   Subjective:  Much better Headache  No cp Eating and drinking SOB much improved-never been told has OSA   Objective:  Vitals:   07/26/17 0509 07/26/17 0700 07/26/17 0746 07/26/17 0809  BP: 138/81 138/75    Pulse: 70 71 77   Resp: 20 18    Temp: 98.2 F (36.8 C) 98.3 F (36.8 C)    TempSrc: Oral Oral    SpO2: 99% 99% 95% 98%  Weight: 75.4 kg (166 lb 4.8 oz)     Height:       Examination:   not in distress  S1-S2 slightly tachy no murmur Decreased crackles L post Abdomen soft nt nd no rebound Lower extremities -No lower extremity swelling Moving 4 limbs equally without defciit-power 5/5, reflexes 2/3 tongue midline, smile symm shrug wnl,plantars down  Data Reviewed: I have personally reviewed following labs and imaging studies  CBC: Recent Labs  Lab 07/21/17 0441 07/22/17 0600  WBC 7.9 8.1  NEUTROABS 5.7 5.8  HGB 10.1* 10.3*  HCT 31.5* 31.4*  MCV 82.7 82.0  PLT 211 784   Basic Metabolic Panel: Recent Labs  Lab 07/21/17 0026 07/21/17 0441 07/22/17 0600 07/23/17 0853 07/24/17 0523  NA 136  --  137  --   --   K 3.9  --  4.5  --   --   CL 103  --  102  --   --   CO2 25  --  26  --   --   GLUCOSE 142*  --  91  --   --   BUN 16  --  13  --   --   CREATININE 1.07  --  1.03  --  1.18  CALCIUM 7.5*  --  8.2*  --   --   MG  --  2.1 2.1 2.0 2.1  PHOS  --  3.5 4.1 5.1* 4.3   Liver Function Tests: Recent Labs  Lab 07/22/17 0600  AST 41  ALT 45  ALKPHOS 88  BILITOT 0.4  PROT 5.6*  ALBUMIN 1.7*   Coagulation Profile: No results for input(s): INR, PROTIME in the last 168 hours. HbA1C: No results for input(s): HGBA1C in the last 72 hours. CBG: Recent Labs  Lab 07/25/17 0722 07/25/17 1105 07/25/17 1652 07/25/17 2111 07/26/17 0739  GLUCAP 150* 183* 307* 155* 126*    Recent Results (from the past 240 hour(s))  Blood Culture (routine x 2)     Status: None   Collection Time:  07/17/17  7:11 AM  Result Value Ref Range Status   Specimen Description BLOOD RIGHT ANTECUBITAL  Final   Special Requests   Final    BOTTLES DRAWN AEROBIC AND ANAEROBIC Blood Culture adequate volume   Culture NO GROWTH 5 DAYS  Final   Report Status 07/22/2017 FINAL  Final  Urine Culture     Status: Abnormal   Collection Time: 07/17/17 11:05 AM  Result Value Ref Range Status   Specimen Description URINE, RANDOM  Final   Special Requests NONE  Final   Culture <10,000 COLONIES/mL INSIGNIFICANT GROWTH (A)  Final   Report Status 07/18/2017 FINAL  Final  Blood Culture (routine x 2)     Status: None   Collection Time: 07/17/17 12:53 PM  Result Value Ref Range Status   Specimen Description BLOOD BLOOD RIGHT WRIST  Final   Special Requests   Final    BOTTLES DRAWN AEROBIC AND ANAEROBIC Blood  Culture adequate volume   Culture NO GROWTH 5 DAYS  Final   Report Status 07/22/2017 FINAL  Final  MRSA PCR Screening     Status: None   Collection Time: 07/18/17  5:09 AM  Result Value Ref Range Status   MRSA by PCR NEGATIVE NEGATIVE Final    Comment:        The GeneXpert MRSA Assay (FDA approved for NASAL specimens only), is one component of a comprehensive MRSA colonization surveillance program. It is not intended to diagnose MRSA infection nor to guide or monitor treatment for MRSA infections.          Radiology Studies: No results found. Scheduled Meds: . amLODipine  5 mg Oral Daily  . atorvastatin  40 mg Oral Daily  . budesonide (PULMICORT) nebulizer solution  0.25 mg Nebulization BID  . enoxaparin (LOVENOX) injection  40 mg Subcutaneous Q24H  . furosemide  60 mg Intravenous Q12H  . gabapentin  300 mg Oral TID  . insulin aspart  0-9 Units Subcutaneous TID WC  . insulin aspart protamine- aspart  20 Units Subcutaneous BID WC  . ipratropium-albuterol  3 mL Nebulization TID  . polyethylene glycol  17 g Oral Daily  . sodium chloride flush  3 mL Intravenous Q12H   Continuous  Infusions:    LOS: 9 days     Verneita Griffes, MD [facility] ([phone]   If 7PM-7AM, please contact night-coverage [email or web address] Password [email or web address] 07/26/2017, 9:06 AM

## 2017-07-27 ENCOUNTER — Encounter: Payer: Self-pay | Admitting: Family Medicine

## 2017-07-27 DIAGNOSIS — A419 Sepsis, unspecified organism: Secondary | ICD-10-CM

## 2017-07-27 DIAGNOSIS — R6520 Severe sepsis without septic shock: Secondary | ICD-10-CM

## 2017-07-27 LAB — BASIC METABOLIC PANEL
Anion gap: 9 (ref 5–15)
BUN: 22 mg/dL — AB (ref 6–20)
CHLORIDE: 104 mmol/L (ref 101–111)
CO2: 27 mmol/L (ref 22–32)
CREATININE: 1.22 mg/dL (ref 0.61–1.24)
Calcium: 8.4 mg/dL — ABNORMAL LOW (ref 8.9–10.3)
GFR calc Af Amer: >60 mL/min (ref >60)
GFR calc non Af Amer: >60 mL/min (ref >60)
GLUCOSE: 152 mg/dL — AB (ref 65–99)
Potassium: 4.2 mmol/L (ref 3.5–5.1)
SODIUM: 140 mmol/L (ref 135–145)

## 2017-07-27 LAB — GLUCOSE, CAPILLARY
GLUCOSE-CAPILLARY: 162 mg/dL — AB (ref 65–99)
Glucose-Capillary: 223 mg/dL — ABNORMAL HIGH (ref 65–99)

## 2017-07-27 MED ORDER — INSULIN LISPRO PROT & LISPRO (75-25 MIX) 100 UNIT/ML KWIKPEN: 20.0000 [IU] | PEN_INJECTOR | Freq: Two times a day (BID) | SUBCUTANEOUS | 11 refills | Status: DC

## 2017-07-27 MED ORDER — DIPHENOXYLATE-ATROPINE 2.5-0.025 MG PO TABS
1.0000 | ORAL_TABLET | Freq: Four times a day (QID) | ORAL | Status: DC
  Administered 2017-07-27: 1 via ORAL
  Filled 2017-07-27: qty 1

## 2017-07-27 MED ORDER — FUROSEMIDE 40 MG PO TABS: 40.0000 mg | ORAL_TABLET | Freq: Two times a day (BID) | ORAL | 0 refills | Status: DC

## 2017-07-27 MED ORDER — IPRATROPIUM-ALBUTEROL 0.5-2.5 (3) MG/3ML IN SOLN: 3.0000 mL | Freq: Two times a day (BID) | RESPIRATORY_TRACT | Status: DC

## 2017-07-27 MED ORDER — DM-GUAIFENESIN ER 30-600 MG PO TB12: 1.0000 | ORAL_TABLET | Freq: Two times a day (BID) | ORAL | 0 refills | Status: DC | PRN

## 2017-07-27 NOTE — Discharge Summary (Signed)
Physician Discharge Summary  Willie Jordan NKN:[id number] DOB: 1965/10/28 DOA: 07/17/2017  PCP: Mack Hook, MD  Admit date: 07/17/2017 Discharge date: 07/27/2017  Time spent: 35 minutes  Recommendations for Outpatient Follow-up:  1. Will need basic metabolic panel in about 1 week and follow-up chest x-ray in 1 month 2. Discontinue lisinopril HCTZ in favor of lisinopril only started Lasix 40 twice daily this admission and will need titration and discussion of the same as an outpatient 3. Insulin 7030 dose dropped to 26-20 units twice daily needs outpatient A1c 4. Given work excuse for 2 weeks  Discharge Diagnoses:  Principal Problem:   Severe sepsis ([place]) Active Problems:   HLD (hyperlipidemia)   Essential hypertension   Type 2 diabetes mellitus with hyperglycemia (HCC)   Diabetic neuropathy ([place])   Retinopathy due to secondary diabetes mellitus, with macular edema, with proliferative retinopathy ([place])   Community acquired bacterial pneumonia   Abdominal pain   Nausea and vomiting   AKI (acute kidney injury) ([place])   Hyponatremia   Acute respiratory failure with hypoxemia [facility])   Discharge Condition: Improved  Diet recommendation: Heart healthy diabetic low-salt fluid restrict 1500  Filed Weights   07/25/17 0515 07/26/17 0509 07/27/17 0354  Weight: 76.8 kg (169 lb 6.4 oz) 75.4 kg (166 lb 4.8 oz) 73.6 kg (162 lb 3.2 oz)    History of present illness:  29M DM 2 ~ 12 years with retinopathy, neuropathy, PAD, 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  Admit 07/17/17 fever of 102.9, hypoxic to 74% on room air, tachypnea,  ill-appearing. He was admitted with sepsis secondary to community-acquired multilobar pneumonia, acute kidney injury, abdominal pain and diarrhea. Ruled in for influenza A. Hypoxic requiring high flow nasal cannula oxygen and BiPAP overnight for last 2 nights. PCCM consulted 1/13.   Hospital Course:  Severe sepsis Due to influenza 'A' acute bronchitis-possible LLL PNA Rx  Initially IV ceftriaxone and azithromycin.  improved. Blood cultures 2: Negative. MRSA PCR negative. Flu panel PCR + influenza A on 1/12.  Ceftriaxone discontinued 1/14-on Azithro stop 1/17--rpt 2 vw cxr 1/17 shows possible LLL pna--finished 7 d abx-no further abx needed Headache-tingling in head/hands has no focal deficit.  Tender in occiput and relieved by traction-much beter with mucinex and chloraseptic and given limited prescription on discharge ? Community-acquired pneumonia: . Chest x-ray as above. Pro-calcitonin 0.26.Strep PNA and Legionella are neg Influenza A with acute bronchitis: Started Tamiflu 07/18/17-stop 1/17. Supportive treatment. Acute decompensated diastolic HF, Grd 2 on Echo 1.14.19: -15 liter and satting well off of oxygen but will need it for ambulation as desats with moving around will need education about heart failure continue Lasix 40 twice daily as an outpatient continue amlodipine consider initiation of beta-blocker continue lisinopril as outpatient repeat labs Acute respiratory failure with hypoxia 2/2 influenza A acute bronchitis with superimposed heart failure.off bipap but desat-CCM consulted 1/13. Down to HFNC 8-->6 -->3-->2L/m bipap d/c and attempt not to use for now.  Needs oxygen as an outpatient 2 L with ambulation-probably will not be able to work at his current job for a while note given Acute kidney injury: Baseline normal creatinine. Presented with creatinine of 1.4.--1.1-on discharge creatinine was 1.3 Diarrhea: Diarrhea resolved. Now  constipated. Start MiraLAX. Hypokalemia:  Replaced. Uncontrolled type II DM with peripheral neuropathy, nephropathy/proteinuria: A1c 7. Worsened by a dose of steroids that he received in ED. NovoLog 70/30 insulin  25 units twice a day and added SSI. hypogly to 66--now on 23 U bid-->20 U-sugars 100 range-sugars 126-155 Cont gabapentin 300 tid for neuropathy Essential hypertension: Controlled moderately Continue amlodipine 5 daily Hyperlipidemia: Continue statins. Anemia: Follow CBCs and transfuse if hemoglobin less than 7 g per DL. Stable. Thrombocytopenia: Likely due to acute illness. Resolved. Nausea vomiting-started 1/14, has this every 2-3 months-?  Diabetic gastroparesis-improved     Procedures:  X-ray  Echocardiogram EF 95-62% grade 2 diastolic   Consultations:  Critical care  Discharge Exam: Vitals:   07/27/17 0354 07/27/17 0812  BP: 130/73 140/81  Pulse: 80 77  Resp: 17   Temp: 98.4 F (36.9 C) 98.1 F (36.7 C)  SpO2: 95% 91%    General: Alert pleasant oriented no distress tolerating diet sitting up at bedside eating full breakfast no oxygen on sats 93 Cardiovascular: S1-S2 no murmur telemetry benign Respiratory: Clinically clear no added sound but some mild scattered wheezes and rales in the left posterior lung fields  Discharge Instructions   Discharge Instructions    Diet - low sodium heart healthy   Complete by:  As directed    Discharge instructions   Complete by:  As directed    Note medication changes including discontinuation of combination lisinopril HCTZ-only take lisinopril, we have started Lasix 40 mg twice a day, your insulin dosage has dropped to 20 units of 7030 twice daily You will need an x-ray in about 1 month and a visit with your primary care physician in about 2 weeks-because you are on Lasix which can alter your kidney function I would recommend that you get labs in 1 week prior to going to your physician subsequently   [place] en cuenta los [employer], incluida la  suspensin de la combinacin de lisinopril HCTZ; tome lisinopril; hemos comenzado a administrar Lasix 40 mg [employer], su dosis de insulina ha disminuido a 20 unidades de 7030 dos veces al [employer]. Necesitar una radiografa en aproximadamente 1 mes y Ardelia Mems visita con su mdico de atencin primaria en aproximadamente 2 semanas, ya que est en Lasix, lo que puede alterar su funcin renal. Le recomendara que se someta a los laboratorios en 1 semana antes de acudir a su mdico posteriormente La daremos de alta en el hogar con oxgeno 2 L en todo momento y su mdico puede examinarlo para ver si necesita oxgeno en el futuro Le dar una excusa de [place] de su trabajo de construccin durante 2 semanas, ya que probablemente no podr Field seismologist trabajo manual por un tiempo hasta que se recupere. 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   atorvastatin 40 MG tablet Commonly known as:  LIPITOR Take 1 tablet (40 mg total) by mouth daily.   blood glucose meter kit and supplies Dispense based on patient and insurance preference. Use up to four times daily as directed. (FOR ICD-9 250.00, 250.01).   dextromethorphan-guaiFENesin 30-600 MG 12hr tablet Commonly known as:  MUCINEX DM Take 1 tablet by mouth 2 (two) times  daily as needed for cough (congestion).   furosemide 40 MG tablet Commonly known as:  LASIX Take 1 tablet (40 mg total) by mouth 2 (two) times daily.   gabapentin 300 MG capsule Commonly known as:  NEURONTIN Take 1 capsule (300 mg total) by mouth 3 (three) times daily.   Insulin Lispro Prot & Lispro (75-25) 100 UNIT/ML Kwikpen Commonly known as:  HUMALOG MIX 75/25 KWIKPEN Inject 20 Units into the skin 2 (two) times daily. What changed:    how much to take  how to take this  when to take this  additional instructions   Insulin Pen Needle 31G X 5 MM Misc Use as directed twice daily   lisinopril 20 MG tablet Commonly known as:  PRINIVIL,ZESTRIL 1 tab by mouth daily   metFORMIN 500 MG 24 hr tablet Commonly known as:  GLUCOPHAGE-XR 2 tabs by mouth with breakfast daily            Durable Medical Equipment  (From admission, onward)        Start     Ordered   07/27/17 0901  DME Oxygen  Once    Question Answer Comment  Mode or (Route) Nasal cannula   Liters per Minute 2   Frequency Continuous (stationary and portable oxygen unit needed)   Oxygen conserving device No   Oxygen delivery system Gas      07/27/17 0900     Allergies  Allergen Reactions  . Aspirin Rash      The results of significant diagnostics from this hospitalization (including imaging, microbiology, ancillary and laboratory) are listed below for reference.    Significant Diagnostic Studies: Dg Chest 2 View  Result Date: 07/26/2017 CLINICAL DATA:  Pneumonia EXAM: CHEST  2 VIEW COMPARISON:  July 23, 2017 FINDINGS: There has been partial but incomplete clearing of infiltrate from the left base. Patchy opacity remains in this area. Lungs elsewhere are clear. Heart size and pulmonary vascularity are normal. No adenopathy. No bone lesions. IMPRESSION: Partial but incomplete clearing of left lower lobe pneumonia. No new opacity. Stable cardiac silhouette. Electronically Signed   By: Lowella Grip III M.D.   On: 07/26/2017 09:59   Dg Chest 2 View  Result Date: 07/23/2017 CLINICAL DATA:  Pneumonia, former smoker. History of diabetes and hypertension. EXAM: CHEST  2 VIEW COMPARISON:  Chest x-ray of July 19, 2017 FINDINGS: The lungs are adequately inflated. The interstitial markings remain increased. There is a confluent airspace opacity present lateral to the cardiac apex more conspicuous than on the previous study. Alveolar opacities in the left upper lobe and right mid and upper lung are slightly less conspicuous. The heart and pulmonary vascularity are normal. The bony thorax is unremarkable. IMPRESSION: Left lower lobe pneumonia more conspicuous today. Decreased conspicuity of airspace opacities in the upper lobe. Trace bilateral pleural effusions. Electronically Signed   By: David  Martinique M.D.   On: 07/23/2017 09:17  Dg Chest 2 View  Result Date: 07/19/2017 CLINICAL DATA:  Pneumonia EXAM: CHEST  2 VIEW COMPARISON:  07/17/2017 FINDINGS: Bilateral upper lobe patchy opacities have increased. Mild patchy opacities at the left base unchanged. Low volumes. Normal heart size. No pneumothorax. Small pleural effusions are suspected. IMPRESSION: Patchy bilateral pulmonary opacities have increased. Followup PA and lateral chest X-ray is recommended in 3-4 weeks following trial of antibiotic therapy to ensure resolution and exclude underlying malignancy. Small pleural effusions are suspected. Electronically Signed   By: Marybelle Killings M.D.   On: 07/19/2017 08:20   Dg Chest Port 1 View  Result Date: 07/17/2017 CLINICAL DATA:  Cough and abdominal pain EXAM: PORTABLE CHEST 1 VIEW COMPARISON:  September 27, 2016 FINDINGS: There is patchy airspace consolidation in the left base. There is also patchy airspace consolidation in the right upper lobe. Lungs elsewhere are clear. Heart is upper normal in size with pulmonary vascularity within normal limits. No adenopathy. No bone lesions. There is aortic  atherosclerosis. IMPRESSION: New patchy opacity left base and right upper lobe. Suspect multifocal pneumonia. Stable cardiac silhouette. There is aortic atherosclerosis. Aortic Atherosclerosis (ICD10-I70.0). Electronically Signed   By: Lowella Grip III M.D.   On: 07/17/2017 08:01   Dg Abd Portable 1v  Result Date: 07/20/2017 CLINICAL DATA:  Ileus.  History of diabetes. EXAM: PORTABLE ABDOMEN - 1 VIEW COMPARISON:  Chest radiograph 07/19/2017 FINDINGS: Gas is demonstrated within nondilated loops of large and small bowel. Supine evaluation limited for the detection of free intraperitoneal air. Unremarkable osseous skeleton. IMPRESSION: Nonobstructed bowel gas pattern. Electronically Signed   By: Lovey Newcomer M.D.   On: 07/20/2017 17:49    Microbiology: Recent Results (from the past 240 hour(s))  Urine Culture     Status: Abnormal   Collection Time: 07/17/17 11:05 AM  Result Value Ref Range Status   Specimen Description URINE, RANDOM  Final   Special Requests NONE  Final   Culture <10,000 COLONIES/mL INSIGNIFICANT GROWTH (A)  Final   Report Status 07/18/2017 FINAL  Final  Blood Culture (routine x 2)     Status: None   Collection Time: 07/17/17 12:53 PM  Result Value Ref Range Status   Specimen Description BLOOD BLOOD RIGHT WRIST  Final   Special Requests   Final    BOTTLES DRAWN AEROBIC AND ANAEROBIC Blood Culture adequate volume   Culture NO GROWTH 5 DAYS  Final   Report Status 07/22/2017 FINAL  Final  MRSA PCR Screening     Status: None   Collection Time: 07/18/17  5:09 AM  Result Value Ref Range Status   MRSA by PCR NEGATIVE NEGATIVE Final    Comment:        The GeneXpert MRSA Assay (FDA approved for NASAL specimens only), is one component of a comprehensive MRSA colonization surveillance program. It is not intended to diagnose MRSA infection nor to guide or monitor treatment for MRSA infections.      Labs: Basic Metabolic Panel: Recent Labs  Lab 07/21/17 0026  07/21/17 0441 07/22/17 0600 07/23/17 0853 07/24/17 0523 07/26/17 0653 07/27/17 0346  NA 136  --  137  --   --  138 140  K 3.9  --  4.5  --   --  4.2 4.2  CL 103  --  102  --   --  101 104  CO2 25  --  26  --   --  27 27  GLUCOSE 142*  --  91  --   --  145* 152*  BUN 16  --  13  --   --  21* 22*  CREATININE 1.07  --  1.03  --  1.18 1.32* 1.22  CALCIUM 7.5*  --  8.2*  --   --  8.4* 8.4*  MG  --  2.1 2.1 2.0 2.1  --   --   PHOS  --  3.5 4.1 5.1* 4.3  --   --    Liver Function Tests: Recent Labs  Lab 07/22/17 0600  AST 41  ALT 45  ALKPHOS 88  BILITOT 0.4  PROT 5.6*  ALBUMIN 1.7*   No results for input(s): LIPASE, AMYLASE in the last 168 hours. No results for input(s): AMMONIA in the last 168 hours. CBC: Recent Labs  Lab 07/21/17 0441 07/22/17 0600  WBC 7.9 8.1  NEUTROABS 5.7 5.8  HGB 10.1* 10.3*  HCT 31.5* 31.4*  MCV 82.7 82.0  PLT 211 246   Cardiac Enzymes: No results for input(s): CKTOTAL, CKMB, CKMBINDEX, TROPONINI in the last 168 hours. BNP: BNP (last 3 results) Recent Labs    07/17/17 0711  BNP 107.3*    ProBNP (last 3 results) No results for input(s): PROBNP in the last 8760 hours.  CBG: Recent Labs  Lab 07/26/17 0739 07/26/17 1144 07/26/17 1514 07/26/17 2142 07/27/17 0732  GLUCAP 126* 283* 300* 177* 162*       Signed:  Nita Sells MD   [facility] 07/27/2017, 9:00 AM

## 2017-07-27 NOTE — Progress Notes (Signed)
SATURATION QUALIFICATIONS: (This note is used to comply with regulatory documentation for home oxygen)  Patient Saturations on Room Air at Rest = 98%  Patient Saturations on Room Air while Ambulating = 87-94%  Patient Saturations on 0 Liters of oxygen while Ambulating = N/A   Please briefly explain why patient needs home oxygen: Pt dropped to 87% while talking. Once pt stopped talking, pt sats came up to 90%.

## 2017-07-27 NOTE — Care Management (Signed)
SATURATION QUALIFICATIONS: (This note is used to comply with regulatory documentation for home oxygen)  Patient Saturations on Room Air at Rest = 98%*  Patient Saturations on Room Air while Ambulating = 98-93%  Patient Saturations on 0 Liters of oxygen while Ambulating = NA  Please briefly explain why patient needs home oxygen: Pt will not qualify for home 02 at this time.

## 2017-08-04 ENCOUNTER — Encounter: Payer: Self-pay | Admitting: Internal Medicine

## 2017-08-04 ENCOUNTER — Ambulatory Visit: Payer: Self-pay | Admitting: Internal Medicine

## 2017-08-04 VITALS — BP 136/80 | HR 72 | Resp 12 | Ht 66.25 in | Wt 174.0 lb

## 2017-08-04 DIAGNOSIS — E1165 Type 2 diabetes mellitus with hyperglycemia: Secondary | ICD-10-CM

## 2017-08-04 DIAGNOSIS — Z794 Long term (current) use of insulin: Secondary | ICD-10-CM

## 2017-08-04 DIAGNOSIS — R609 Edema, unspecified: Secondary | ICD-10-CM

## 2017-08-04 DIAGNOSIS — J159 Unspecified bacterial pneumonia: Secondary | ICD-10-CM

## 2017-08-04 DIAGNOSIS — I10 Essential (primary) hypertension: Secondary | ICD-10-CM

## 2017-08-04 DIAGNOSIS — J101 Influenza due to other identified influenza virus with other respiratory manifestations: Secondary | ICD-10-CM

## 2017-08-04 DIAGNOSIS — N179 Acute kidney failure, unspecified: Secondary | ICD-10-CM

## 2017-08-04 LAB — GLUCOSE, POCT (MANUAL RESULT ENTRY): POC Glucose: 291 mg/dl — AB (ref 70–99)

## 2017-08-04 MED ORDER — AMLODIPINE BESYLATE 10 MG PO TABS: ORAL_TABLET | ORAL | 11 refills | Status: DC

## 2017-08-04 MED ORDER — FUROSEMIDE 40 MG PO TABS: ORAL_TABLET | ORAL | 11 refills | Status: DC

## 2017-08-04 MED ORDER — INSULIN LISPRO PROT & LISPRO (75-25 MIX) 100 UNIT/ML KWIKPEN: PEN_INJECTOR | SUBCUTANEOUS | 11 refills | Status: AC

## 2017-08-04 NOTE — Progress Notes (Signed)
Subjective:    Patient ID: Willie Jordan, male    DOB: [birth year], 52 y.o.   MRN: [id number]  HPI   Here for hospital follow up for Influenza A and possibly secondary bacterial pneumonia with acute respiratory failure requiring Bipap. CXR with concern for LLL pneumonia Patient presented to clinic 2 days earlier with GI symptoms of nausea, vomiting and diarrhea. Blood cultures were negative.  MRSA PCR negative.  Strep and Legionella evaluation negative. Treated 7 days with Ceftriaxone and Azithromycin.    Found to have cardiac diastolic dysfunction on echo.  EF was 55-60%  Acute kidney injury with dehydration and above.  Creatinine recovered at time of discharge.  Creatinine was 1.4 at time of admission and was 1.1.  DM:  Sugars running around 200 since discharge from hospital.  His Humalog was decreased to 20 units twice daily in the hospital and his sugars have increased.  Was using 26 units in the morning and 24 units in the evening prior to illness. Continues with Metformin ER 500 mg 2 tabs daily in the morning with breakfast  Hypertension:  His Lisinopril/HCTZ was discontinued.  Using amlodipine 5 mg now along with Furosemide 40 mg daily.  continues also on Lisinopril 20 mg daily.  Peripheral edema:  Better with use of Furosemide.   He does not note a difference if takes 40 mg twice daily vs once daily.   Current Meds  Medication Sig  . amLODipine (NORVASC) 10 MG tablet 1 tab by mouth daily  . atorvastatin (LIPITOR) 40 MG tablet Take 1 tablet (40 mg total) by mouth daily.  . blood glucose meter kit and supplies Dispense based on patient and insurance preference. Use up to four times daily as directed. (FOR ICD-9 250.00, 250.01).  . furosemide (LASIX) 40 MG tablet 1-2 tabs by mouth daily for swelling of feet.  . gabapentin (NEURONTIN) 300 MG capsule Take 1 capsule (300 mg total) by mouth 3 (three) times daily.  . Insulin Lispro Prot & Lispro (HUMALOG MIX 75/25 KWIKPEN) (75-25)  100 UNIT/ML Kwikpen Inject 26 units before breakfast and 24 units before dinner daily  . Insulin Pen Needle 31G X 5 MM MISC Use as directed twice daily  . lisinopril (PRINIVIL,ZESTRIL) 20 MG tablet 1 tab by mouth daily  . metFORMIN (GLUCOPHAGE-XR) 500 MG 24 hr tablet 2 tabs by mouth with breakfast daily  . [DISCONTINUED] amLODipine (NORVASC) 5 MG tablet Take 1 tablet (5 mg total) by mouth daily.  . [DISCONTINUED] furosemide (LASIX) 40 MG tablet Take 1 tablet (40 mg total) by mouth 2 (two) times daily.  . [DISCONTINUED] Insulin Lispro Prot & Lispro (HUMALOG MIX 75/25 KWIKPEN) (75-25) 100 UNIT/ML Kwikpen Inject 20 Units into the skin 2 (two) times daily.    Allergies  Allergen Reactions  . Aspirin Rash        Review of Systems     Objective:   Physical Exam  NAD HEENT:  Throat without injection, MMM, TMs pearly gray Neck:  Supple, No adenopathy Chest:  CTA CV:  RRR without murmur or rub, radial pulses normal and equal Abd:  S, NT, No HSM or mass, + BS LE:  Significantly decreased edema of ankles and feet.        Assessment & Plan:  1.  Influenza A with possible secondary bacterial pneumonia:  Much improved.  2.  DM:  Patient to take up his insulin by 2 units each dose every 2 days if sugars remain on average above 200 to  max of his previous dosing.  3.  AKI:  Resolved. Recheck BMP with switch of diuretic.  4.  Essential Hypertension with diastolic dysfunction on echo:  Increase Amlodipine to 10 mg daily.  Continue Lisinopril at current dosing and furosemide as above.  5.  Peripheral edema:  Much improved with Furosemide.  As long as controlled with once daily, continue with that.  Call if problems getting sugars back under control. Follow up in 3 months.

## 2017-08-05 LAB — BASIC METABOLIC PANEL
BUN / CREAT RATIO: 19 (ref 9–20)
BUN: 22 mg/dL (ref 6–24)
CO2: 27 mmol/L (ref 20–29)
CREATININE: 1.13 mg/dL (ref 0.76–1.27)
Calcium: 8.7 mg/dL (ref 8.7–10.2)
Chloride: 101 mmol/L (ref 96–106)
GFR, EST AFRICAN AMERICAN: 88 mL/min/{1.73_m2} (ref >59)
GFR, EST NON AFRICAN AMERICAN: 76 mL/min/{1.73_m2} (ref >59)
Glucose: 228 mg/dL — ABNORMAL HIGH (ref 65–99)
Potassium: 5 mmol/L (ref 3.5–5.2)
SODIUM: 142 mmol/L (ref 134–144)

## 2017-08-12 ENCOUNTER — Telehealth: Payer: Self-pay | Admitting: Internal Medicine

## 2017-08-12 NOTE — Telephone Encounter (Signed)
To Antony Madura to get more information

## 2017-08-12 NOTE — Telephone Encounter (Signed)
Patient called and wants Doctor Amil Amen to know that his sugar levels are at 200 to 200.  He takes 26 units of insulin in the morning and 24 units of insuline at night.  Also, patient has cramps in stomach and back when doing things.  Patient wants to know if he can drink Gatorade to help the cramps.  Patient wants to be called.

## 2017-08-12 NOTE — Telephone Encounter (Signed)
Antony Madura called patient to get more information regarding sugar levels. Patient stated sugar are running between 200-220 even with the increased of the insulin all the time per patient; also patient stated is having also cramps in stomach and back when doing any activity patient stated tried Gatorade  Today.patient did not say if it was beneficial.   Antony Madura told patient needs to be seen and offered and appointment for Friday or any day day next week and patient refused it stating was out of work for so many days not long ago and did not want to lose his job. Patient stated will come to clinic on his next F/U appointment.  Antony Madura told patient to call clinic if symptoms  get worse; patient verbalized understanding.

## 2017-08-16 NOTE — Telephone Encounter (Signed)
Notify patient if his blood sugar is running above 200 both in the morning and the evening regularly as he describes, he can increase both his morning dose and evening does of insulin by 2 units.  If no improvement in 2 days, he should do this again.  To call if no improvement. Have him drop in for a BMP to evaluate the cramps if he cannot come in to be seen. Cherice if you could take care of this

## 2017-08-20 NOTE — Telephone Encounter (Signed)
Left detailed message for patient.

## 2017-08-28 ENCOUNTER — Other Ambulatory Visit: Payer: Self-pay

## 2017-08-28 DIAGNOSIS — E782 Mixed hyperlipidemia: Secondary | ICD-10-CM

## 2017-08-28 DIAGNOSIS — Z79899 Other long term (current) drug therapy: Secondary | ICD-10-CM

## 2017-08-28 NOTE — Telephone Encounter (Signed)
Error

## 2017-08-29 LAB — LIPID PANEL W/O CHOL/HDL RATIO
CHOLESTEROL TOTAL: 254 mg/dL — AB (ref 100–199)
HDL: 41 mg/dL (ref >39)
LDL Calculated: 185 mg/dL — ABNORMAL HIGH (ref 0–99)
TRIGLYCERIDES: 139 mg/dL (ref 0–149)
VLDL Cholesterol Cal: 28 mg/dL (ref 5–40)

## 2017-08-29 LAB — HEPATIC FUNCTION PANEL
ALK PHOS: 108 IU/L (ref 39–117)
ALT: 61 IU/L — AB (ref 0–44)
AST: 107 IU/L — AB (ref 0–40)
Albumin: 3.3 g/dL — ABNORMAL LOW (ref 3.5–5.5)
BILIRUBIN TOTAL: 0.3 mg/dL (ref 0.0–1.2)
BILIRUBIN, DIRECT: 0.07 mg/dL (ref 0.00–0.40)
Total Protein: 6.1 g/dL (ref 6.0–8.5)

## 2017-08-31 ENCOUNTER — Other Ambulatory Visit: Payer: Self-pay

## 2017-09-04 ENCOUNTER — Encounter: Payer: Self-pay | Admitting: Internal Medicine

## 2017-09-04 ENCOUNTER — Ambulatory Visit: Payer: Self-pay | Admitting: Internal Medicine

## 2017-09-12 ENCOUNTER — Encounter: Payer: Self-pay | Admitting: Internal Medicine

## 2017-09-18 NOTE — Telephone Encounter (Signed)
Patient notified and have an appointment scheduled for 09/28/17 at 3:00 p.m

## 2017-09-18 NOTE — Telephone Encounter (Signed)
Please call patient--I believe he missed his follow up appt If he did not reschedule, please call and have him do so.

## 2017-09-22 ENCOUNTER — Other Ambulatory Visit: Payer: Self-pay | Admitting: Internal Medicine

## 2017-09-28 ENCOUNTER — Ambulatory Visit (INDEPENDENT_AMBULATORY_CARE_PROVIDER_SITE_OTHER): Payer: Self-pay | Admitting: Internal Medicine

## 2017-09-28 ENCOUNTER — Encounter: Payer: Self-pay | Admitting: Internal Medicine

## 2017-09-28 VITALS — BP 130/64 | HR 70 | Resp 14 | Ht 66.25 in | Wt 179.5 lb

## 2017-09-28 DIAGNOSIS — E1165 Type 2 diabetes mellitus with hyperglycemia: Secondary | ICD-10-CM

## 2017-09-28 DIAGNOSIS — Z794 Long term (current) use of insulin: Secondary | ICD-10-CM

## 2017-09-28 DIAGNOSIS — E782 Mixed hyperlipidemia: Secondary | ICD-10-CM

## 2017-09-28 DIAGNOSIS — R609 Edema, unspecified: Secondary | ICD-10-CM

## 2017-09-28 DIAGNOSIS — I10 Essential (primary) hypertension: Secondary | ICD-10-CM

## 2017-09-28 LAB — GLUCOSE, POCT (MANUAL RESULT ENTRY): POC GLUCOSE: 183 mg/dL — AB (ref 70–99)

## 2017-09-28 NOTE — Patient Instructions (Signed)
Knee graduated compression stockings.  20-30 mm

## 2017-09-28 NOTE — Progress Notes (Signed)
   Subjective:    Patient ID: Willie Jordan, male    DOB: 12/16/1965, 52 y.o.   MRN: [id number]  HPI   1.  Legs swelling.  Describes sitting long periods of time or being on feet.  Describes salty meals. Eating out a lot as he has not had a refrigerator for the past month.  He thinks he will get a refrigerator next week. He is taking his Furosemide 40 mg twice daily now.  2.  DM:  Sugars running in high 100s.  Checking intermittently.  Not clear why he is backing off his care.  3.  Hyperlipidemia:  Trigs were much improved but rest of cholesterol was not end of January.  Transaminases high as well.  We were unable to get hold of him as his phone was down.  He states it is back up now.  Did not receive our letter.  He was also moving at the time.  STates he does not skip his meds when he does not eat.   Current Meds  Medication Sig  . amLODipine (NORVASC) 10 MG tablet 1 tab by mouth daily  . atorvastatin (LIPITOR) 40 MG tablet Take 1 tablet (40 mg total) by mouth daily.  . blood glucose meter kit and supplies Dispense based on patient and insurance preference. Use up to four times daily as directed. (FOR ICD-9 250.00, 250.01).  . furosemide (LASIX) 40 MG tablet 1-2 tabs by mouth daily for swelling of feet.  . gabapentin (NEURONTIN) 300 MG capsule Take 1 capsule (300 mg total) by mouth 3 (three) times daily.  . Insulin Lispro Prot & Lispro (HUMALOG MIX 75/25 KWIKPEN) (75-25) 100 UNIT/ML Kwikpen Inject 26 units before breakfast and 24 units before dinner daily  . Insulin Pen Needle 31G X 5 MM MISC Use as directed twice daily  . lisinopril (PRINIVIL,ZESTRIL) 20 MG tablet 1 tab by mouth daily  . metFORMIN (GLUCOPHAGE-XR) 500 MG 24 hr tablet 2 tabs by mouth with breakfast daily  . metFORMIN (GLUCOPHAGE-XR) 500 MG 24 hr tablet TAKE 2 TABLETS BY MOUTH DAILY WITH BREAKFAST    Allergies  Allergen Reactions  . Aspirin Rash    Review of Systems     Objective:   Physical Exam  NAD Lungs:  CTA CV:  RRR with normal S1 and S2, No S3, S4 or murmur.  Radial pulses normal and equal.  Palpable DPpulses.  No JVD LE:  Mild edema of bilateral feet  Thickened fissured flaking skin at medial shin bilateral      Assessment & Plan:  1.  Peripheral edema:  Difficulties with cooking at home, recently in process of moving, etc.  Encouraged now that he seems to be settling to get back to healthier low sodium eating and to recline and elevate feet when sitting. Encouraged to obtain graduated compression stockings and wear during day--knee high. Continue Furosemide twice daily CMP in 2 weeks with fasting labs.  2.  DM:  Again, moving about and with limited access to home meals currently.  As above.  Discussed monitoring DM better. Not clear how significantly worse his control is.  A1C in 2 weeks with labs. Follow up in 3 months with me. 3.  Hyperlipidemia:  As above.  FLP in 2 weeks.

## 2017-10-13 ENCOUNTER — Other Ambulatory Visit: Payer: Self-pay | Admitting: Internal Medicine

## 2017-10-13 DIAGNOSIS — E782 Mixed hyperlipidemia: Secondary | ICD-10-CM

## 2017-10-13 DIAGNOSIS — Z794 Long term (current) use of insulin: Secondary | ICD-10-CM

## 2017-10-13 DIAGNOSIS — Z79899 Other long term (current) drug therapy: Secondary | ICD-10-CM

## 2017-10-13 DIAGNOSIS — E1165 Type 2 diabetes mellitus with hyperglycemia: Secondary | ICD-10-CM

## 2017-10-14 LAB — COMPREHENSIVE METABOLIC PANEL
A/G RATIO: 1 — AB (ref 1.2–2.2)
ALBUMIN: 3.1 g/dL — AB (ref 3.5–5.5)
ALT: 39 IU/L (ref 0–44)
AST: 37 IU/L (ref 0–40)
Alkaline Phosphatase: 117 IU/L (ref 39–117)
BUN/Creatinine Ratio: 19 (ref 9–20)
BUN: 21 mg/dL (ref 6–24)
Bilirubin Total: 0.2 mg/dL (ref 0.0–1.2)
CALCIUM: 8.9 mg/dL (ref 8.7–10.2)
CO2: 28 mmol/L (ref 20–29)
Chloride: 102 mmol/L (ref 96–106)
Creatinine, Ser: 1.11 mg/dL (ref 0.76–1.27)
GFR, EST AFRICAN AMERICAN: 90 mL/min/{1.73_m2} (ref >59)
GFR, EST NON AFRICAN AMERICAN: 78 mL/min/{1.73_m2} (ref >59)
GLOBULIN, TOTAL: 3 g/dL (ref 1.5–4.5)
Glucose: 183 mg/dL — ABNORMAL HIGH (ref 65–99)
POTASSIUM: 4.2 mmol/L (ref 3.5–5.2)
SODIUM: 142 mmol/L (ref 134–144)
TOTAL PROTEIN: 6.1 g/dL (ref 6.0–8.5)

## 2017-10-14 LAB — HGB A1C W/O EAG: Hgb A1c MFr Bld: 8 % — ABNORMAL HIGH (ref 4.8–5.6)

## 2017-10-14 LAB — LIPID PANEL W/O CHOL/HDL RATIO
CHOLESTEROL TOTAL: 279 mg/dL — AB (ref 100–199)
HDL: 37 mg/dL — AB (ref >39)
LDL Calculated: 196 mg/dL — ABNORMAL HIGH (ref 0–99)
TRIGLYCERIDES: 229 mg/dL — AB (ref 0–149)
VLDL Cholesterol Cal: 46 mg/dL — ABNORMAL HIGH (ref 5–40)

## 2017-10-28 ENCOUNTER — Encounter: Payer: Self-pay | Admitting: Internal Medicine

## 2017-10-28 ENCOUNTER — Ambulatory Visit: Payer: Self-pay | Admitting: Internal Medicine

## 2017-10-28 VITALS — BP 160/80 | HR 86 | Resp 12 | Ht 66.25 in | Wt 184.0 lb

## 2017-10-28 DIAGNOSIS — I10 Essential (primary) hypertension: Secondary | ICD-10-CM

## 2017-10-28 DIAGNOSIS — E133519 Other specified diabetes mellitus with proliferative diabetic retinopathy with macular edema, unspecified eye: Secondary | ICD-10-CM

## 2017-10-28 DIAGNOSIS — K0889 Other specified disorders of teeth and supporting structures: Secondary | ICD-10-CM

## 2017-10-28 DIAGNOSIS — Z794 Long term (current) use of insulin: Secondary | ICD-10-CM

## 2017-10-28 DIAGNOSIS — E1165 Type 2 diabetes mellitus with hyperglycemia: Secondary | ICD-10-CM

## 2017-10-28 DIAGNOSIS — E782 Mixed hyperlipidemia: Secondary | ICD-10-CM

## 2017-10-28 LAB — GLUCOSE, POCT (MANUAL RESULT ENTRY): POC Glucose: 505 mg/dl — AB (ref 70–99)

## 2017-10-28 MED ORDER — ATORVASTATIN CALCIUM 80 MG PO TABS: ORAL_TABLET | ORAL | 11 refills | Status: DC

## 2017-10-28 MED ORDER — METFORMIN HCL ER 500 MG PO TB24: ORAL_TABLET | ORAL | 3 refills | Status: DC

## 2017-10-28 NOTE — Addendum Note (Signed)
Addended by: Marcelino Duster on: 10/28/2017 04:07 PM   Modules accepted: Orders

## 2017-10-28 NOTE — Progress Notes (Addendum)
Subjective:    Patient ID: Willie Jordan, male    DOB: 1965/07/24, 52 y.o.   MRN: [id number]  HPI   [facility] card is expired   1.  DM:  Discussed his A1C is back up significantly to 8.0%.  His sugar today is 505, though did just eat.  Describes a meal with too much sugar/carbohydrate. He has been eating out a lot until about a week ago as well.  He states he is now settled where he lives and has cold food storage again. He is eating better. He is taking only Metformin ER 1000 mg in the morning. Switched to ER once daily dosing previously as he was forgetting evening dose with regular release. Continues with Humalog 75/25 26 units in morning and 24 units in evening.  Today had injections to both eyes with Dr. Noel Journey, ophthalmology.  Goes back in 6 weeks.  2.  Hyperlipidemia:  States he was taking Atorvastatin 40 mg daily before labs done on the 9th.  We confirmed he picked up his meds regularly in the 3 months prior to having labs done as well.  Pill bottle today has a number of pills that is appropriate as well. He does admit that 2 months ago, he was not eating well due to move and lack of cold food storage. Cholesterol panel was actually higher than before he started Atorvastatin.  Lipid Panel     Component Value Date/Time   CHOL 279 (H) 10/13/2017 0912   TRIG 229 (H) 10/13/2017 0912   HDL 37 (L) 10/13/2017 0912   CHOLHDL 7.6 09/27/2016 1643   VLDL 36 09/27/2016 1643   LDLCALC 196 (H) 10/13/2017 0912   LDLDIRECT  05/25/2007 1135    64 (NOTE) ATP III Classification (LDL):      < 100        mg/dL         Optimal     100 - 129     mg/dL         Near or Above Optimal     130 - 159     mg/dL         Borderline High     160 - 189     mg/dL         High      > 190        mg/dL         Very  High    3.  Essential Hypertension:  Gaining weight and bp up.  States not missing medication.    Current Meds  Medication Sig  . amLODipine (NORVASC) 10 MG tablet 1 tab by mouth daily  .  atorvastatin (LIPITOR) 40 MG tablet Take 1 tablet (40 mg total) by mouth daily.  . blood glucose meter kit and supplies Dispense based on patient and insurance preference. Use up to four times daily as directed. (FOR ICD-9 250.00, 250.01).  . Cyanocobalamin (VITAMIN B 12 PO) Take by mouth daily.  . furosemide (LASIX) 40 MG tablet 1-2 tabs by mouth daily for swelling of feet.  . gabapentin (NEURONTIN) 300 MG capsule Take 1 capsule (300 mg total) by mouth 3 (three) times daily.  . Insulin Lispro Prot & Lispro (HUMALOG MIX 75/25 KWIKPEN) (75-25) 100 UNIT/ML Kwikpen Inject 26 units before breakfast and 24 units before dinner daily  . Insulin Pen Needle 31G X 5 MM MISC Use as directed twice daily  . lisinopril (PRINIVIL,ZESTRIL) 20 MG tablet 1 tab by mouth daily  .  metFORMIN (GLUCOPHAGE-XR) 500 MG 24 hr tablet 2 tabs by mouth with breakfast daily   Allergies  Allergen Reactions  . Aspirin Rash    Review of Systems     Objective:   Physical Exam NAD Wearing sunglasses Lungs:  CTA CV:  RRR without murmur or rub. Radial and DP/PT pulses normal and equal LE:  Moderate pitting edema to high pretibial area.  Diabetic Foot Exam - Simple   Simple Foot Form Diabetic Foot exam was performed with the following findings:  Yes 10/28/2017  3:55 PM  Visual Inspection See comments:  Yes Sensation Testing See comments:  Yes Pulse Check Posterior Tibialis and Dorsalis pulse intact bilaterally:  Yes Comments Thickened yellowed crumbling nails, both feet.  Skin dusky and thickened.  Moderate pitting edema of feet bilaterally.  Feels monofilament only at base of middle toes on left.  Only across base of toes on right.  No skin breakdown.  No erythema             Assessment & Plan:  1.  DM:  Not as well controlled.  To work on diet and some weight loss as well as increase to 2 doses of Metformin ER 1000 mg daily.  2.  Hyperlipidemia:  Increase to 80 mg daily of Atorvastatin.  Repeat hepatic profile  and FLP in 2 months.  To again, work on diet.  3.  Diabetic retinopathy:  As per ophthalmology.  Needs tighter control of sugars as well.  4.  Essential Hypertension:  To work on diet and continue current meds.  5.  Peripheral edema:  To watch sodium intake and control BP  6.  Dental pain:  Brings up at end of visit he has dental pain

## 2017-11-02 ENCOUNTER — Ambulatory Visit: Payer: Self-pay | Admitting: Internal Medicine

## 2017-12-04 ENCOUNTER — Other Ambulatory Visit: Payer: Self-pay

## 2017-12-04 ENCOUNTER — Telehealth: Payer: Self-pay | Admitting: Internal Medicine

## 2017-12-04 MED ORDER — ATORVASTATIN CALCIUM 80 MG PO TABS: ORAL_TABLET | ORAL | 9 refills | Status: DC

## 2017-12-04 NOTE — Telephone Encounter (Signed)
Patient called stating would like Atorvastatin 80 mg  to be sent to [place] at [facility]. Please notify pt. Once is sent.  Please advise.

## 2017-12-04 NOTE — Telephone Encounter (Signed)
Rx sent to [facility]. Please notify patient

## 2017-12-21 ENCOUNTER — Other Ambulatory Visit: Payer: Self-pay

## 2017-12-21 MED ORDER — GABAPENTIN 300 MG PO CAPS: 300.0000 mg | ORAL_CAPSULE | Freq: Three times a day (TID) | ORAL | 11 refills | Status: DC

## 2017-12-29 ENCOUNTER — Other Ambulatory Visit: Payer: Self-pay

## 2017-12-30 ENCOUNTER — Ambulatory Visit: Payer: Self-pay | Admitting: Internal Medicine

## 2017-12-31 ENCOUNTER — Ambulatory Visit: Payer: Self-pay | Admitting: Internal Medicine

## 2018-01-21 ENCOUNTER — Other Ambulatory Visit: Payer: Self-pay

## 2018-01-21 DIAGNOSIS — Z79899 Other long term (current) drug therapy: Secondary | ICD-10-CM

## 2018-01-21 DIAGNOSIS — E782 Mixed hyperlipidemia: Secondary | ICD-10-CM

## 2018-01-22 LAB — HEPATIC FUNCTION PANEL
ALBUMIN: 3.1 g/dL — AB (ref 3.5–5.5)
ALT: 38 IU/L (ref 0–44)
AST: 70 IU/L — ABNORMAL HIGH (ref 0–40)
Alkaline Phosphatase: 121 IU/L — ABNORMAL HIGH (ref 39–117)
BILIRUBIN TOTAL: 0.2 mg/dL (ref 0.0–1.2)
Bilirubin, Direct: 0.06 mg/dL (ref 0.00–0.40)
Total Protein: 6.1 g/dL (ref 6.0–8.5)

## 2018-01-22 LAB — LIPID PANEL W/O CHOL/HDL RATIO
Cholesterol, Total: 304 mg/dL — ABNORMAL HIGH (ref 100–199)
HDL: 32 mg/dL — AB (ref >39)
LDL Calculated: 197 mg/dL — ABNORMAL HIGH (ref 0–99)
Triglycerides: 374 mg/dL — ABNORMAL HIGH (ref 0–149)
VLDL CHOLESTEROL CAL: 75 mg/dL — AB (ref 5–40)

## 2018-02-10 ENCOUNTER — Ambulatory Visit: Payer: Self-pay | Admitting: Internal Medicine

## 2018-02-10 ENCOUNTER — Encounter: Payer: Self-pay | Admitting: Internal Medicine

## 2018-02-10 VITALS — BP 190/90 | HR 74 | Resp 12 | Ht 66.25 in | Wt 189.0 lb

## 2018-02-10 DIAGNOSIS — E1129 Type 2 diabetes mellitus with other diabetic kidney complication: Secondary | ICD-10-CM

## 2018-02-10 DIAGNOSIS — R809 Proteinuria, unspecified: Secondary | ICD-10-CM

## 2018-02-10 DIAGNOSIS — Z794 Long term (current) use of insulin: Secondary | ICD-10-CM

## 2018-02-10 DIAGNOSIS — E1165 Type 2 diabetes mellitus with hyperglycemia: Secondary | ICD-10-CM

## 2018-02-10 DIAGNOSIS — Z91199 Patient's noncompliance with other medical treatment and regimen due to unspecified reason: Secondary | ICD-10-CM

## 2018-02-10 DIAGNOSIS — R220 Localized swelling, mass and lump, head: Secondary | ICD-10-CM

## 2018-02-10 DIAGNOSIS — I10 Essential (primary) hypertension: Secondary | ICD-10-CM

## 2018-02-10 DIAGNOSIS — E782 Mixed hyperlipidemia: Secondary | ICD-10-CM

## 2018-02-10 DIAGNOSIS — Z9119 Patient's noncompliance with other medical treatment and regimen: Secondary | ICD-10-CM

## 2018-02-10 MED ORDER — LISINOPRIL 20 MG PO TABS: ORAL_TABLET | ORAL | 3 refills | Status: DC

## 2018-02-10 NOTE — Progress Notes (Signed)
Subjective:    Patient ID: Willie Jordan, male    DOB: [birth year], 52 y.o.   MRN: [id number]  HPI   1.  DM:  Sugars running in 200s.  Then it can be as low as 50.  He admits to missing insulin in the evening 3-4 times weekly. Never eats breakfast, but gives himself insulin in the morning and then has low blood sugar a bit later. Eating at [employer].  Eats fried vegetables and rice. Last A1C in April was 8.0%  2.  Hyperlipidemia:  States he did not feel well on the Atorvastatin--sounds like had reflux.  He states he had not stopped before he had his labs, but did stop after labs drawn. Discussed his cholesterol actually went up substantially while reportedly on Atorvastatin.  He states he never missed prior to recent labs, but missing insulin in evening as above. Has a bottle from May at [facility] with 12 tabs inside.  States this was from last month that he moved from a [facility] prescription. Lipid Panel     Component Value Date/Time   CHOL 304 (H) 01/21/2018 0845   TRIG 374 (H) 01/21/2018 0845   HDL 32 (L) 01/21/2018 0845   CHOLHDL 7.6 09/27/2016 1643   VLDL 36 09/27/2016 1643   LDLCALC 197 (H) 01/21/2018 0845   LDLDIRECT  05/25/2007 1135    64 (NOTE) ATP III Classification (LDL):      < 100        mg/dL         Optimal     100 - 129     mg/dL         Near or Above Optimal     130 - 159     mg/dL         Borderline High     160 - 189     mg/dL         High      > 190        mg/dL         Very  High     3.  Essential Hypertension:  States never misses Lisinopril or Amlodipine.  Bottle of Lisinopril empty today.  He states took last pill this morning. BP quite high recently and pharmacy gives info that he is missing fills. No renal function checked with recent labs--was mainly to follow up on cholesterol and hepatic profile.  4.  Elevated AST with recent labs:  Denies ETOH intake  Current Meds  Medication Sig  . amLODipine (NORVASC) 10 MG tablet 1 tab by mouth daily  .  blood glucose meter kit and supplies Dispense based on patient and insurance preference. Use up to four times daily as directed. (FOR ICD-9 250.00, 250.01).  . Cyanocobalamin (VITAMIN B 12 PO) Take by mouth daily.  . furosemide (LASIX) 40 MG tablet 1-2 tabs by mouth daily for swelling of feet.  . Insulin Lispro Prot & Lispro (HUMALOG MIX 75/25 KWIKPEN) (75-25) 100 UNIT/ML Kwikpen Inject 26 units before breakfast and 24 units before dinner daily  . Insulin Pen Needle 31G X 5 MM MISC Use as directed twice daily  . lisinopril (PRINIVIL,ZESTRIL) 20 MG tablet 1 tab by mouth daily  . metFORMIN (GLUCOPHAGE-XR) 500 MG 24 hr tablet 2 tabs by mouth twice daily with meals  . Omega-3 Fatty Acids (FISH OIL PO) Take by mouth daily.  . [DISCONTINUED] gabapentin (NEURONTIN) 300 MG capsule Take 1 capsule (300 mg total) by mouth 3 (  three) times daily.  . [DISCONTINUED] lisinopril (PRINIVIL,ZESTRIL) 20 MG tablet 1 tab by mouth daily    Allergies  Allergen Reactions  . Aspirin Rash      Review of Systems     Objective:   Physical Exam  Appears chronically ill with facial bloating. HEENT:  PERRL, EOMI, throat without injection Neck:  Supple, No adenopathy, generous thyroid Chest: CTA CV:  RRR without murmur or rub.  Radial and DP pulses normal and equal Abd: S, NT, No HSM or mas, + BS LE:  Trace edema.      Assessment & Plan:  1.  Non compliance:  Very concerned regarding patient's missed meds again.  Not sure if he is sharing all of his barriers or if he remains in denial about the severity of his poorly controlled illnesses.  We did check with [facility] and his fills were not up to date.  2.  DM:  Strongly urged to never give himself insulin and then not eat.  Discussed ways to have prepared easy meals to eat after giving insulin in the morning and evening.  Urged to schedule his meds and meals out for the week so not missing either. 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  Hyperlipidemia:  Has taken Pitivastatin without improvement and Atorvastatin without improvement and suspect never taking either medication regularly to know if effective for him. Also with reflux complaints with Atorvastatin.   Patient left before could complete information gathering from pharmacy. Will see if can obtain Rosuvastatin 10 mg to start with daily at a cost he can afford.  4.  Facial bloating: checked TSH previously and was in normal range, but recheck due to his appearance  5.  Hypertension:  Poorly controlled.  Check BMP for kidney function.  Encouraged to not miss meds. Followup in 2 weeks with BP and pulse and with me in 3 months.

## 2018-02-11 LAB — BASIC METABOLIC PANEL
BUN/Creatinine Ratio: 27 — ABNORMAL HIGH (ref 9–20)
BUN: 37 mg/dL — ABNORMAL HIGH (ref 6–24)
CO2: 24 mmol/L (ref 20–29)
CREATININE: 1.38 mg/dL — AB (ref 0.76–1.27)
Calcium: 8.6 mg/dL — ABNORMAL LOW (ref 8.7–10.2)
Chloride: 104 mmol/L (ref 96–106)
GFR calc Af Amer: 69 mL/min/{1.73_m2} (ref >59)
GFR, EST NON AFRICAN AMERICAN: 60 mL/min/{1.73_m2} (ref >59)
Glucose: 146 mg/dL — ABNORMAL HIGH (ref 65–99)
Potassium: 4.1 mmol/L (ref 3.5–5.2)
SODIUM: 142 mmol/L (ref 134–144)

## 2018-02-11 LAB — TSH: TSH: 1.01 u[IU]/mL (ref 0.450–4.500)

## 2018-02-19 ENCOUNTER — Other Ambulatory Visit: Payer: Self-pay

## 2018-02-19 MED ORDER — GABAPENTIN 300 MG PO CAPS: 300.0000 mg | ORAL_CAPSULE | Freq: Three times a day (TID) | ORAL | 0 refills | Status: DC

## 2018-02-21 ENCOUNTER — Telehealth: Payer: Self-pay | Admitting: Internal Medicine

## 2018-02-21 ENCOUNTER — Encounter: Payer: Self-pay | Admitting: Internal Medicine

## 2018-02-21 MED ORDER — ROSUVASTATIN CALCIUM 10 MG PO TABS: 10.0000 mg | ORAL_TABLET | Freq: Every day | ORAL | 11 refills | Status: DC

## 2018-02-21 NOTE — Telephone Encounter (Signed)
Switching to Rosuvastatin 10 mg daily for cholesterol lowering med.  It is $15 or so at [facility].  I have printed a coupon for this and would appreciate the info below being called into [facility] on Monday.  This is in place of the Atorvastatin and is a low dose that will likely need to be increased--I want to be sure he tolerates it first. 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   I would also like to find out if he requested the fill of gabapentin to [facility] as that will likely be expensive.  I cannot see who sent that on the 16th.  Not sure if a GoodRx was made for that as well.  Please let me know   Discount Drug Coupon  Your Prescription: rosuvastatin 10mg   90 tablets  Discounted price with this coupon: 17.77  This is your estimated price at [facility] [employer]; the pharmacy will provide the exact pricing.  Pharmacist Info: Member ID [id number] [place] [id number] RxBin [id number] RxPCN [id number] Customer Questions Call: [phone]  Pharmacist Questions Call: [phone]

## 2018-02-22 NOTE — Telephone Encounter (Signed)
Spoke with patient. Informed to pick up Rx and will come by the office to pick up good Rx coupon. Patient requested Rx for [facility]abapentin to [facility] on Friday before we left the office. Patient states it was not expensive. Patient lab results discussed as well. Verbalized understanding

## 2018-04-09 ENCOUNTER — Inpatient Hospital Stay (HOSPITAL_COMMUNITY)
Admission: EM | Admit: 2018-04-09 | Discharge: 2018-04-12 | DRG: 683 | Disposition: A | Discharge status: Home Health | Payer: No Typology Code available for payment source | Attending: Internal Medicine | Admitting: Internal Medicine

## 2018-04-09 ENCOUNTER — Other Ambulatory Visit: Payer: Self-pay

## 2018-04-09 ENCOUNTER — Emergency Department (HOSPITAL_COMMUNITY): Payer: No Typology Code available for payment source

## 2018-04-09 ENCOUNTER — Encounter (HOSPITAL_COMMUNITY): Payer: Self-pay | Admitting: Emergency Medicine

## 2018-04-09 DIAGNOSIS — E860 Dehydration: Secondary | ICD-10-CM | POA: Diagnosis present

## 2018-04-09 DIAGNOSIS — E785 Hyperlipidemia, unspecified: Secondary | ICD-10-CM | POA: Diagnosis present

## 2018-04-09 DIAGNOSIS — L03115 Cellulitis of right lower limb: Secondary | ICD-10-CM | POA: Diagnosis present

## 2018-04-09 DIAGNOSIS — E1122 Type 2 diabetes mellitus with diabetic chronic kidney disease: Secondary | ICD-10-CM

## 2018-04-09 DIAGNOSIS — I5032 Chronic diastolic (congestive) heart failure: Secondary | ICD-10-CM | POA: Diagnosis present

## 2018-04-09 DIAGNOSIS — N182 Chronic kidney disease, stage 2 (mild): Secondary | ICD-10-CM | POA: Diagnosis present

## 2018-04-09 DIAGNOSIS — I130 Hypertensive heart and chronic kidney disease with heart failure and stage 1 through stage 4 chronic kidney disease, or unspecified chronic kidney disease: Secondary | ICD-10-CM | POA: Diagnosis present

## 2018-04-09 DIAGNOSIS — Z87891 Personal history of nicotine dependence: Secondary | ICD-10-CM

## 2018-04-09 DIAGNOSIS — N179 Acute kidney failure, unspecified: Principal | ICD-10-CM | POA: Diagnosis present

## 2018-04-09 DIAGNOSIS — S91331A Puncture wound without foreign body, right foot, initial encounter: Secondary | ICD-10-CM | POA: Diagnosis present

## 2018-04-09 DIAGNOSIS — N184 Chronic kidney disease, stage 4 (severe): Secondary | ICD-10-CM | POA: Diagnosis present

## 2018-04-09 DIAGNOSIS — W450XXA Nail entering through skin, initial encounter: Secondary | ICD-10-CM

## 2018-04-09 DIAGNOSIS — Z794 Long term (current) use of insulin: Secondary | ICD-10-CM

## 2018-04-09 DIAGNOSIS — E1129 Type 2 diabetes mellitus with other diabetic kidney complication: Secondary | ICD-10-CM | POA: Diagnosis present

## 2018-04-09 DIAGNOSIS — E1140 Type 2 diabetes mellitus with diabetic neuropathy, unspecified: Secondary | ICD-10-CM | POA: Diagnosis present

## 2018-04-09 DIAGNOSIS — Z89422 Acquired absence of other left toe(s): Secondary | ICD-10-CM

## 2018-04-09 DIAGNOSIS — S91331D Puncture wound without foreign body, right foot, subsequent encounter: Secondary | ICD-10-CM

## 2018-04-09 DIAGNOSIS — Z23 Encounter for immunization: Secondary | ICD-10-CM

## 2018-04-09 DIAGNOSIS — E1142 Type 2 diabetes mellitus with diabetic polyneuropathy: Secondary | ICD-10-CM | POA: Diagnosis present

## 2018-04-09 DIAGNOSIS — Z79899 Other long term (current) drug therapy: Secondary | ICD-10-CM

## 2018-04-09 LAB — CBC WITH DIFFERENTIAL/PLATELET
Abs Immature Granulocytes: 0 10*3/uL (ref 0.0–0.1)
Basophils Absolute: 0.1 10*3/uL (ref 0.0–0.1)
Basophils Relative: 1 %
EOS PCT: 1 %
Eosinophils Absolute: 0.1 10*3/uL (ref 0.0–0.7)
HEMATOCRIT: 32.8 % — AB (ref 39.0–52.0)
HEMOGLOBIN: 10.4 g/dL — AB (ref 13.0–17.0)
Immature Granulocytes: 0 %
LYMPHS ABS: 1.7 10*3/uL (ref 0.7–4.0)
LYMPHS PCT: 14 %
MCH: 26.5 pg (ref 26.0–34.0)
MCHC: 31.7 g/dL (ref 30.0–36.0)
MCV: 83.5 fL (ref 78.0–100.0)
MONO ABS: 1.1 10*3/uL — AB (ref 0.1–1.0)
MONOS PCT: 9 %
Neutro Abs: 9.7 10*3/uL — ABNORMAL HIGH (ref 1.7–7.7)
Neutrophils Relative %: 75 %
Platelets: 213 10*3/uL (ref 150–400)
RBC: 3.93 MIL/uL — ABNORMAL LOW (ref 4.22–5.81)
RDW: 13 % (ref 11.5–15.5)
WBC: 12.7 10*3/uL — ABNORMAL HIGH (ref 4.0–10.5)

## 2018-04-09 LAB — BASIC METABOLIC PANEL
Anion gap: 7 (ref 5–15)
BUN: 48 mg/dL — AB (ref 6–20)
CHLORIDE: 104 mmol/L (ref 98–111)
CO2: 23 mmol/L (ref 22–32)
Calcium: 8.2 mg/dL — ABNORMAL LOW (ref 8.9–10.3)
Creatinine, Ser: 2.44 mg/dL — ABNORMAL HIGH (ref 0.61–1.24)
GFR calc Af Amer: 34 mL/min — ABNORMAL LOW (ref >60)
GFR calc non Af Amer: 29 mL/min — ABNORMAL LOW (ref >60)
Glucose, Bld: 174 mg/dL — ABNORMAL HIGH (ref 70–99)
POTASSIUM: 4.1 mmol/L (ref 3.5–5.1)
Sodium: 134 mmol/L — ABNORMAL LOW (ref 135–145)

## 2018-04-09 MED ORDER — SODIUM CHLORIDE 0.9 % IV BOLUS
1000.0000 mL | Freq: Once | INTRAVENOUS | Status: AC
  Administered 2018-04-09: 1000 mL via INTRAVENOUS

## 2018-04-09 MED ORDER — ROSUVASTATIN CALCIUM 10 MG PO TABS
10.0000 mg | ORAL_TABLET | Freq: Every day | ORAL | Status: DC
  Administered 2018-04-10 – 2018-04-12 (×3): 10 mg via ORAL
  Filled 2018-04-09 (×3): qty 1

## 2018-04-09 MED ORDER — POLYETHYLENE GLYCOL 3350 17 G PO PACK: 17.0000 g | PACK | Freq: Every day | ORAL | Status: DC | PRN

## 2018-04-09 MED ORDER — INSULIN ASPART 100 UNIT/ML ~~LOC~~ SOLN
0.0000 [IU] | Freq: Three times a day (TID) | SUBCUTANEOUS | Status: DC
  Administered 2018-04-10: 2 [IU] via SUBCUTANEOUS
  Administered 2018-04-11 – 2018-04-12 (×3): 1 [IU] via SUBCUTANEOUS

## 2018-04-09 MED ORDER — ACETAMINOPHEN 650 MG RE SUPP: 650.0000 mg | Freq: Four times a day (QID) | RECTAL | Status: DC | PRN

## 2018-04-09 MED ORDER — OMEGA-3-ACID ETHYL ESTERS 1 G PO CAPS
1.0000 g | ORAL_CAPSULE | Freq: Every day | ORAL | Status: DC
  Administered 2018-04-10 – 2018-04-12 (×3): 1 g via ORAL
  Filled 2018-04-09 (×3): qty 1

## 2018-04-09 MED ORDER — ONDANSETRON HCL 4 MG/2ML IJ SOLN: 4.0000 mg | Freq: Four times a day (QID) | INTRAMUSCULAR | Status: DC | PRN

## 2018-04-09 MED ORDER — AMLODIPINE BESYLATE 10 MG PO TABS
10.0000 mg | ORAL_TABLET | Freq: Every day | ORAL | Status: DC
  Administered 2018-04-10 – 2018-04-12 (×3): 10 mg via ORAL
  Filled 2018-04-09 (×3): qty 1

## 2018-04-09 MED ORDER — CEPHALEXIN 500 MG PO CAPS
500.0000 mg | ORAL_CAPSULE | Freq: Three times a day (TID) | ORAL | Status: DC
  Administered 2018-04-09 – 2018-04-12 (×9): 500 mg via ORAL
  Filled 2018-04-09: qty 2
  Filled 2018-04-09 (×5): qty 1
  Filled 2018-04-09: qty 2
  Filled 2018-04-09 (×3): qty 1

## 2018-04-09 MED ORDER — INSULIN ASPART PROT & ASPART (70-30 MIX) 100 UNIT/ML ~~LOC~~ SUSP
18.0000 [IU] | Freq: Every day | SUBCUTANEOUS | Status: DC
  Administered 2018-04-10 – 2018-04-12 (×3): 18 [IU] via SUBCUTANEOUS
  Filled 2018-04-09: qty 10

## 2018-04-09 MED ORDER — HYDRALAZINE HCL 20 MG/ML IJ SOLN: 5.0000 mg | INTRAMUSCULAR | Status: DC | PRN

## 2018-04-09 MED ORDER — ZOLPIDEM TARTRATE 5 MG PO TABS
5.0000 mg | ORAL_TABLET | Freq: Every evening | ORAL | Status: DC | PRN
  Administered 2018-04-10: 5 mg via ORAL
  Filled 2018-04-09 (×2): qty 1

## 2018-04-09 MED ORDER — ONDANSETRON HCL 4 MG PO TABS: 4.0000 mg | ORAL_TABLET | Freq: Four times a day (QID) | ORAL | Status: DC | PRN

## 2018-04-09 MED ORDER — TETANUS-DIPHTH-ACELL PERTUSSIS 5-2.5-18.5 LF-MCG/0.5 IM SUSP
0.5000 mL | Freq: Once | INTRAMUSCULAR | Status: AC
  Administered 2018-04-09: 0.5 mL via INTRAMUSCULAR
  Filled 2018-04-09: qty 0.5

## 2018-04-09 MED ORDER — GABAPENTIN 300 MG PO CAPS
300.0000 mg | ORAL_CAPSULE | Freq: Two times a day (BID) | ORAL | Status: DC
  Administered 2018-04-10 – 2018-04-12 (×6): 300 mg via ORAL
  Filled 2018-04-09 (×6): qty 1

## 2018-04-09 MED ORDER — VITAMIN B-12 1000 MCG PO TABS
1000.0000 ug | ORAL_TABLET | Freq: Every day | ORAL | Status: DC
  Administered 2018-04-10 – 2018-04-12 (×3): 1000 ug via ORAL
  Filled 2018-04-09 (×3): qty 1

## 2018-04-09 MED ORDER — ACETAMINOPHEN 325 MG PO TABS
650.0000 mg | ORAL_TABLET | Freq: Four times a day (QID) | ORAL | Status: DC | PRN
  Administered 2018-04-12: 650 mg via ORAL
  Filled 2018-04-09: qty 2

## 2018-04-09 MED ORDER — SODIUM CHLORIDE 0.9 % IV SOLN
INTRAVENOUS | Status: DC
  Administered 2018-04-10 (×2): via INTRAVENOUS

## 2018-04-09 NOTE — ED Triage Notes (Signed)
Pt reports hx of diabetes. Pt reports rt foot injury from nail found in shoe with bleeding. Pt doesn't know when this happened, sometime today. No foreign object seen in foot at this time. Puncture wound to bottom of foot with serous fluid expelled. Pt ambulatory with a limp. Denies pain at this time, only pain with pressure.

## 2018-04-09 NOTE — ED Notes (Signed)
Bulky dressing placed on R foot at triage.  Pt back to waiting room and updated on wait for treatment room.

## 2018-04-09 NOTE — ED Provider Notes (Signed)
Patient placed in Quick Look pathway, seen and evaluated   Chief Complaint: Puncture wound right foot  HPI:   Patient with diabetes and peripheral neuropathy presents for evaluation of puncture wound to the plantar aspect of the right foot.  He noticed a screw on the bottom of his right shoe with the sharp and piercing into the sole of the shoe.  He noticed a wound to the plantar aspect of the left foot but states his neuropathy is advanced to the point that he does not feel much.  He does state it is minimally tender to palpation.  Denies fevers or chills.  Unsure of his tetanus is up-to-date.  ROS: Wound  Physical Exam:   Gen: No distress  Neuro: Awake and Alert  Skin: Warm    Focused Exam: 2+ DP/PT pulses bilaterally.  Trace pitting edema of the bilateral lower extremities.  Puncture wound to the plantar aspect of the right foot draining serous fluid.  Bleeding controlled.   Initiation of care has begun. The patient has been counseled on the process, plan, and necessity for staying for the completion/evaluation, and the remainder of the medical screening examination    Renita Papa, PA-C 04/09/18 Yevette Edwards    Orlie Dakin, MD 04/10/18 [phone]

## 2018-04-09 NOTE — H&P (Signed)
History and Physical    Willie Jordan QMG:[id number] DOB: 1965/09/06 DOA: 04/09/2018  Referring MD/NP/PA:   PCP: Mack Hook, MD   Patient coming from:  The patient is coming from home.  At baseline, pt is independent for most of ADL.   Chief Complaint: Right foot injury  HPI: Willie Jordan is a 52 y.o. male with medical history significant of hypertension, hyperlipidemia, diabetes mellitus, peripheral neuropathy, CKD-2, CHF, who presents with right foot injury.  Pt state that he noted right foot injury by a screw on the bottom of his right shoe with the sharp and piercing into the sole of the shoe. He noted wound to the plantar aspect of the right foot, with mild bleeding and serous fluid expelled.  Due peripheral neuropathy, patient does not feel pain. Pt doesn't know when this happened, but possibly sometime today.  Patient denies fever or chills.  No other complaints.  No chest pain, shortness breath, cough.  No nausea vomiting, diarrhea, abdominal pain, symptoms of UTI. He is not unsure of his tetanus is up-to-date.   ED Course: pt was found to have WBC 12.7, worsening renal function, temperature normal, no tachycardia, no tachypnea, oxygen saturation 91% on room air.  X-ray of right foot showed soft tissue swelling without evidence of radiopaque foreign body or acute osseous abnormality. Pt was given Tdap in ED. Pt is placed on MedSurg bed for observation.  Review of Systems:   General: no fevers, chills, no body weight gain, fatigue HEENT: no blurry vision, hearing changes or sore throat Respiratory: no dyspnea, coughing, wheezing CV: no chest pain, no palpitations GI: no nausea, vomiting, abdominal pain, diarrhea, constipation GU: no dysuria, burning on urination, increased urinary frequency, hematuria  Ext: no leg edema Neuro: no unilateral weakness, numbness, or tingling, no vision change or hearing loss Skin: has wound in right foot. MSK: No muscle spasm, no  deformity, no limitation of range of movement in spin Heme: No easy bruising.  Travel history: No recent long distant travel.  Allergy:  Allergies  Allergen Reactions  . Aspirin Rash    Past Medical History:  Diagnosis Date  . Diabetic neuropathy ([place]) 03/17/2017  . Hyperlipidemia   . Hypertension   . Microalbuminuria due to type 2 diabetes mellitus ([place]) 01/30/2017  . Neuropathy   . Onychomycosis of toenail 01/30/2017  . Peripheral neuropathy 03/17/2017  . Type 2 diabetes mellitus, uncontrolled ([place])     Past Surgical History:  Procedure Laterality Date  . AMPUTATION Left 09/29/2016   Procedure: 2nd RAY AMPUTATION LEFT FOOT I&D LEFT FOOT;  Surgeon: Edrick Kins, DPM;  Location: [facility];  Service: Podiatry;  Laterality: Left;  . APPENDECTOMY      Social History:  reports that he quit smoking about 2 years ago. His smoking use included cigarettes. He started smoking about 2 years ago. He has never used smokeless tobacco. He reports that he does not drink alcohol or use drugs.  Family History:  Family History  Problem Relation Age of Onset  . Diabetes Mother   . Diabetes Sister   . Diabetes Brother   . Cancer Maternal Grandmother      Prior to Admission medications   Medication Sig Start Date End Date Taking? Authorizing Provider  amLODipine (NORVASC) 10 MG tablet 1 tab by mouth daily 08/04/17   Mack Hook, MD  blood glucose meter kit and supplies Dispense based on patient and insurance preference. Use up to four times daily as directed. (FOR ICD-9 250.00, 250.01).  10/05/16   Patrecia Pour, MD  Cyanocobalamin (VITAMIN B 12 PO) Take by mouth daily.    [provider]  furosemide (LASIX) 40 MG tablet 1-2 tabs by mouth daily for swelling of feet. 08/04/17   Mack Hook, MD  gabapentin (NEURONTIN) 300 MG capsule Take 1 capsule (300 mg total) by mouth 3 (three) times daily. 02/19/18   Mack Hook, MD  Insulin Lispro Prot & Lispro (HUMALOG MIX 75/25  KWIKPEN) (75-25) 100 UNIT/ML Kwikpen Inject 26 units before breakfast and 24 units before dinner daily 08/04/17   Mack Hook, MD  Insulin Pen Needle 31G X 5 MM MISC Use as directed twice daily 01/12/17   Mack Hook, MD  lisinopril (PRINIVIL,ZESTRIL) 20 MG tablet 1 tab by mouth daily 02/10/18   Mack Hook, MD  metFORMIN (GLUCOPHAGE-XR) 500 MG 24 hr tablet 2 tabs by mouth twice daily with meals 10/28/17   Mack Hook, MD  Omega-3 Fatty Acids (FISH OIL PO) Take by mouth daily.    [provider]  rosuvastatin (CRESTOR) 10 MG tablet Take 1 tablet (10 mg total) by mouth daily. 02/21/18   Mack Hook, MD    Physical Exam: Vitals:   04/09/18 2103 04/09/18 2357 04/10/18 0017 04/10/18 0533  BP: (!) 162/75 (!) 152/73 (!) 162/81 135/70  Pulse: 62 64 66 66  Resp: '16 18 18   ' Temp:   98.1 F (36.7 C) 98.6 F (37 C)  TempSrc:   Oral Oral  SpO2: 99% 95% 92% (!) 89%  Weight:   83.2 kg   Height:       General: Not in acute distress HEENT:       Eyes: PERRL, EOMI, no scleral icterus.       ENT: No discharge from the ears and nose, no pharynx injection, no tonsillar enlargement.        Neck: No JVD, no bruit, no mass felt. Heme: No neck lymph node enlargement. Cardiac: S1/S2, RRR, No murmurs, No gallops or rubs. Respiratory: No rales, wheezing, rhonchi or rubs. GI: Soft, nondistended, nontender, no rebound pain, no organomegaly, BS present. GU: No hematuria Ext: No pitting leg edema bilaterally. 2+DP/PT pulse bilaterally. Musculoskeletal: No joint deformities, No joint redness or warmth, no limitation of ROM in spin. Skin: has a puncture wound to the plantar aspect of the right foot with draining serous fluid, bleeding is controlled. Neuro: Alert, oriented X3, cranial nerves II-XII grossly intact, moves all extremities normally.  Labs on Admission: I have personally reviewed following labs and imaging studies  CBC: Recent Labs  Lab 04/09/18 1935  04/10/18 0101  WBC 12.7* 9.2  NEUTROABS 9.7*  --   HGB 10.4* 10.5*  HCT 32.8* 32.5*  MCV 83.5 83.3  PLT 213 382   Basic Metabolic Panel: Recent Labs  Lab 04/09/18 1935 04/10/18 0101  NA 134* 138  K 4.1 3.7  CL 104 107  CO2 23 25  GLUCOSE 174* 189*  BUN 48* 42*  CREATININE 2.44* 1.93*  CALCIUM 8.2* 8.5*   GFR: Estimated Creatinine Clearance: 46 mL/min (A) (by C-G formula based on SCr of 1.93 mg/dL (H)). Liver Function Tests: No results for input(s): AST, ALT, ALKPHOS, BILITOT, PROT, ALBUMIN in the last 168 hours. No results for input(s): LIPASE, AMYLASE in the last 168 hours. No results for input(s): AMMONIA in the last 168 hours. Coagulation Profile: No results for input(s): INR, PROTIME in the last 168 hours. Cardiac Enzymes: No results for input(s): CKTOTAL, CKMB, CKMBINDEX, TROPONINI in the last 168  hours. BNP (last 3 results) No results for input(s): PROBNP in the last 8760 hours. HbA1C: No results for input(s): HGBA1C in the last 72 hours. CBG: No results for input(s): GLUCAP in the last 168 hours. Lipid Profile: No results for input(s): CHOL, HDL, LDLCALC, TRIG, CHOLHDL, LDLDIRECT in the last 72 hours. Thyroid Function Tests: No results for input(s): TSH, T4TOTAL, FREET4, T3FREE, THYROIDAB in the last 72 hours. Anemia Panel: No results for input(s): VITAMINB12, FOLATE, FERRITIN, TIBC, IRON, RETICCTPCT in the last 72 hours. Urine analysis:    Component Value Date/Time   COLORURINE YELLOW 07/17/2017 1105   APPEARANCEUR CLEAR 07/17/2017 1105   LABSPEC 1.012 07/17/2017 1105   PHURINE 6.0 07/17/2017 1105   GLUCOSEU 50 (A) 07/17/2017 1105   HGBUR MODERATE (A) 07/17/2017 1105   BILIRUBINUR NEGATIVE 07/17/2017 1105   BILIRUBINUR Negative 12/31/2015 1310   KETONESUR NEGATIVE 07/17/2017 1105   PROTEINUR >=300 (A) 07/17/2017 1105   UROBILINOGEN 0.2 12/31/2015 1310   UROBILINOGEN 4.0 (H) 05/29/2007 1609   NITRITE NEGATIVE 07/17/2017 1105   LEUKOCYTESUR NEGATIVE  07/17/2017 1105   Sepsis Labs: '@LABRCNTIP' (procalcitonin:4,lacticidven:4) )No results found for this or any previous visit (from the past 240 hour(s)).   Radiological Exams on Admission: Dg Foot 2 Views Right  Result Date: 04/09/2018 CLINICAL DATA:  Puncture injury to the plantar aspect of the right foot by a screw. Abnormal sensation at the medial aspect of the calcaneus. Limping. EXAM: RIGHT FOOT - 2 VIEW COMPARISON:  None. FINDINGS: There is no evidence of acute fracture or dislocation. Mild degenerative spurring is noted at the first MTP joint. There is also mild degenerative spurring along the dorsal midfoot, and there are small plantar and moderate-sized posterior calcaneal enthesophytes. Spurring is noted at the medial and lateral malleoli as well. There is mild diffuse soft tissue swelling throughout the foot extending into the lower leg. No radiopaque foreign body is identified. IMPRESSION: Soft tissue swelling without evidence of radiopaque foreign body or acute osseous abnormality. Electronically Signed   By: Logan Bores M.D.   On: 04/09/2018 19:45     EKG: Not done in ED  Assessment/Plan Principal Problem:   Acute renal failure superimposed on stage 2 chronic kidney disease ([place]) Active Problems:   HLD (hyperlipidemia)   Diabetic neuropathy (HCC)   Type II diabetes mellitus with renal manifestations ([place])   Puncture wound of right foot   Chronic diastolic CHF (congestive heart failure) (HCC)   Acute renal failure superimposed on stage 2 chronic kidney disease ([place]):  Baseline Cre is 1.1-1.2, pt's Cre is 2.44 and BUN 48 on admission. Likely due to prerenal secondary to dehydration and continuation of ACEI, diuretics.  -will place on med-surg bed for obs - IVF: 1L NS, then 125 cc/h - Follow up renal function by BMP - Hold lasix and lisinopril -Check urinalysis  HLD (hyperlipidemia): -crestor  Diabetic neuropathy ([place]): -Neurontin  Type II diabetes mellitus with renal  manifestations ([place]): Last A1c 8.0 on 10/13/17, poorly controled. Patient is taking metformin, 75/25 insulin at home -will decrease 75/25 insulin from 26- 24 units to 18-16 units twice daily -SSI  Puncture wound of right foot: Patient has no fever, but has mild leukocytosis. -start keflex  -Wound care consult  Chronic diastolic CHF: No leg edema or shortness of breath.  CHF is compensated. - Check BNP -Hold Lasix due to AKI.    DVT ppx: SCD Code Status: Full code Family Communication: None at bed side.  Disposition Plan:  Anticipate discharge back to  previous home environment Consults called:  none Admission status:  medical floor/obs   Date of Service 04/10/2018    Ivor Costa [facility] Pager [phone]  If 7PM-7AM, please contact night-coverage [email or web address] Password [facility] 04/10/2018, 6:58 AM

## 2018-04-09 NOTE — ED Provider Notes (Signed)
[facility] EMERGENCY DEPARTMENT Provider Note   CSN: [id number] Arrival date & time: 04/09/18  1817     History   Chief Complaint Chief Complaint  Patient presents with  . Foot Injury  . Peripheral Neuropathy    HPI Artez Regis is a 52 y.o. male.  The history is provided by the patient and medical records.  Foot Injury      52 y.o. M with hx of diabetic neuropathy, HLP, HTN, DM2, presenting to the ED for wound of right foot.  He reports today he realized there was a screw in the bottom of his sneaker.  He states he is not entirely sure when this happened as he has somewhat advanced peripheral neuropathy.  He removed his shoe and all of screw with it.  States he had some bleeding afterwards but improved at this time.  He denies fevers, chills.  Hx of cellulitis in his feet before from similar injury.  Date of last tetanus unknown.  Past Medical History:  Diagnosis Date  . Diabetic neuropathy ([place]) 03/17/2017  . Hyperlipidemia   . Hypertension   . Microalbuminuria due to type 2 diabetes mellitus ([place]) 01/30/2017  . Neuropathy   . Onychomycosis of toenail 01/30/2017  . Peripheral neuropathy 03/17/2017  . Type 2 diabetes mellitus, uncontrolled ([place])     Patient Active Problem List   Diagnosis Date Noted  . Severe sepsis ([place]) 07/17/2017  . Community acquired bacterial pneumonia 07/17/2017  . Abdominal pain 07/17/2017  . Nausea and vomiting 07/17/2017  . AKI (acute kidney injury) ([place]) 07/17/2017  . Hyponatremia 07/17/2017  . Acute respiratory failure with hypoxemia ([place]) 07/17/2017  . Retinopathy due to secondary diabetes mellitus, with macular edema, with proliferative retinopathy ([place]) 04/16/2017  . Peripheral neuropathy 03/17/2017  . Diabetic neuropathy ([place]) 03/17/2017  . Microalbuminuria due to type 2 diabetes mellitus ([place]) 01/30/2017  . Onychomycosis of toenail 01/30/2017  . Diabetic foot infection ([place]) 09/27/2016  . Sepsis ([place]) 09/27/2016    . Diabetes mellitus ([place])   . Pyogenic inflammation of bone ([place])   . Cellulitis of left foot 09/25/2016  . Noncompliance 09/24/2016  . HLD (hyperlipidemia) 09/30/2014  . Essential hypertension 09/30/2014  . Type 2 diabetes mellitus with hyperglycemia ([place]) 09/30/2014    Past Surgical History:  Procedure Laterality Date  . AMPUTATION Left 09/29/2016   Procedure: 2nd RAY AMPUTATION LEFT FOOT I&D LEFT FOOT;  Surgeon: Edrick Kins, DPM;  Location: [facility];  Service: Podiatry;  Laterality: Left;  . APPENDECTOMY          Home Medications    Prior to Admission medications   Medication Sig Start Date End Date Taking? Authorizing Provider  amLODipine (NORVASC) 10 MG tablet 1 tab by mouth daily 08/04/17   Mack Hook, MD  blood glucose meter kit and supplies Dispense based on patient and insurance preference. Use up to four times daily as directed. (FOR ICD-9 250.00, 250.01). 10/05/16   Patrecia Pour, MD  Cyanocobalamin (VITAMIN B 12 PO) Take by mouth daily.    [provider]  furosemide (LASIX) 40 MG tablet 1-2 tabs by mouth daily for swelling of feet. 08/04/17   Mack Hook, MD  gabapentin (NEURONTIN) 300 MG capsule Take 1 capsule (300 mg total) by mouth 3 (three) times daily. 02/19/18   Mack Hook, MD  Insulin Lispro Prot & Lispro (HUMALOG MIX 75/25 KWIKPEN) (75-25) 100 UNIT/ML Kwikpen Inject 26 units before breakfast and 24 units before dinner daily 08/04/17  Mack Hook, MD  Insulin Pen Needle 31G X 5 MM MISC Use as directed twice daily 01/12/17   Mack Hook, MD  lisinopril (PRINIVIL,ZESTRIL) 20 MG tablet 1 tab by mouth daily 02/10/18   Mack Hook, MD  metFORMIN (GLUCOPHAGE-XR) 500 MG 24 hr tablet 2 tabs by mouth twice daily with meals 10/28/17   Mack Hook, MD  Omega-3 Fatty Acids (FISH OIL PO) Take by mouth daily.    [provider]  rosuvastatin (CRESTOR) 10 MG tablet Take 1 tablet (10 mg total) by mouth daily.  02/21/18   Mack Hook, MD    Family History Family History  Problem Relation Age of Onset  . Diabetes Mother   . Diabetes Sister   . Diabetes Brother   . Cancer Maternal Grandmother     Social History Social History   Tobacco Use  . Smoking status: Former Smoker    Types: Cigarettes    Start date: 10/31/2015    Last attempt to quit: 01/05/2016    Years since quitting: 2.2  . Smokeless tobacco: Never Used  Substance Use Topics  . Alcohol use: No    Alcohol/week: 0.0 standard drinks    Comment: rarely  . Drug use: No     Allergies   Aspirin   Review of Systems Review of Systems  Skin: Positive for wound.  All other systems reviewed and are negative.    Physical Exam Updated Vital Signs BP (!) 162/75 (BP Location: Right Wrist)   Pulse 62   Temp 98.6 F (37 C) (Oral)   Resp 16   Ht _0  (1.651 m)   Wt 79.4 kg   SpO2 99%   BMI 29.12 kg/m   Physical Exam  Constitutional: He is oriented to person, place, and time. He appears well-developed and well-nourished.  Sleeping, NAD  HENT:  Head: Normocephalic and atraumatic.  Mouth/Throat: Oropharynx is clear and moist.  Eyes: Pupils are equal, round, and reactive to light. Conjunctivae and EOM are normal.  Neck: Normal range of motion.  Cardiovascular: Normal rate, regular rhythm and normal heart sounds.  Pulmonary/Chest: Effort normal and breath sounds normal. No stridor. No respiratory distress.  Abdominal: Soft. Bowel sounds are normal. There is no tenderness. There is no rebound.  Musculoskeletal: Normal range of motion.  Puncture wound to heel of right foot; small amount of serosanguinous drainage present; no active bleeding; no tissue crepitus or signs of abscess formation; normal distal sensation and perfusion; decreased sensation of both feet (baseline)  Neurological: He is alert and oriented to person, place, and time.  Skin: Skin is warm and dry.  Psychiatric: He has a normal mood and affect.    Nursing note and vitals reviewed.    ED Treatments / Results  Labs (all labs ordered are listed, but only abnormal results are displayed) Labs Reviewed  CBC WITH DIFFERENTIAL/PLATELET - Abnormal; Notable for the following components:      Result Value   WBC 12.7 (*)    RBC 3.93 (*)    Hemoglobin 10.4 (*)    HCT 32.8 (*)    Neutro Abs 9.7 (*)    Monocytes Absolute 1.1 (*)    All other components within normal limits  BASIC METABOLIC PANEL - Abnormal; Notable for the following components:   Sodium 134 (*)    Glucose, Bld 174 (*)    BUN 48 (*)    Creatinine, Ser 2.44 (*)    Calcium 8.2 (*)    GFR calc non Af Wyvonnia Lora  29 (*)    GFR calc Af Amer 34 (*)    All other components within normal limits    EKG None  Radiology Dg Foot 2 Views Right  Result Date: 04/09/2018 CLINICAL DATA:  Puncture injury to the plantar aspect of the right foot by a screw. Abnormal sensation at the medial aspect of the calcaneus. Limping. EXAM: RIGHT FOOT - 2 VIEW COMPARISON:  None. FINDINGS: There is no evidence of acute fracture or dislocation. Mild degenerative spurring is noted at the first MTP joint. There is also mild degenerative spurring along the dorsal midfoot, and there are small plantar and moderate-sized posterior calcaneal enthesophytes. Spurring is noted at the medial and lateral malleoli as well. There is mild diffuse soft tissue swelling throughout the foot extending into the lower leg. No radiopaque foreign body is identified. IMPRESSION: Soft tissue swelling without evidence of radiopaque foreign body or acute osseous abnormality. Electronically Signed   By: Logan Bores M.D.   On: 04/09/2018 19:45    Procedures Procedures (including critical care time)  Medications Ordered in ED Medications  Tdap (BOOSTRIX) injection 0.5 mL (has no administration in time range)  sodium chloride 0.9 % bolus 1,000 mL (has no administration in time range)     Initial Impression / Assessment and Plan /  ED Course  I have reviewed the triage vital signs and the nursing notes.  Pertinent labs & imaging results that were available during my care of the patient were reviewed by me and considered in my medical decision making (see chart for details).  52 y.o. M here with puncture wound of right foot-- found screw in his shoe today that punctured the foot. Unknown time.  Does have peripheral neuropathy so decreased sensation throughout both his feet.  He is afebrile, non-toxic.  Labs reviewed-- significant increase in SrCr over the past month-- 2.44 today compared with 1.38 1 month ago.  Patient denies heavy use of NSAIDs.  No apparent changes in his BP meds, he is in on ACEI (lisinopril).  Given this change, will admit for AKI.  In regards to foot, no overwhelming signs of infection at this time.  No FB retained in foot on physical exam or x-ray.  Tetanus updated.  Would benefit from ppx abx given his history of cellulitis.  Discussed with Dr. Blaine Hamper-- will admit.  He will evaluate foot and choose abx.  Final Clinical Impressions(s) / ED Diagnoses   Final diagnoses:  AKI (acute kidney injury) ([place])  Puncture wound of right foot, initial encounter    ED Discharge Orders    None       Larene Pickett, PA-C 04/10/18 0244    Orlie Dakin, MD 04/12/18 1140

## 2018-04-10 ENCOUNTER — Other Ambulatory Visit: Payer: Self-pay

## 2018-04-10 DIAGNOSIS — I5032 Chronic diastolic (congestive) heart failure: Secondary | ICD-10-CM | POA: Diagnosis present

## 2018-04-10 LAB — CBC
HEMATOCRIT: 32.5 % — AB (ref 39.0–52.0)
Hemoglobin: 10.5 g/dL — ABNORMAL LOW (ref 13.0–17.0)
MCH: 26.9 pg (ref 26.0–34.0)
MCHC: 32.3 g/dL (ref 30.0–36.0)
MCV: 83.3 fL (ref 78.0–100.0)
Platelets: 180 10*3/uL (ref 150–400)
RBC: 3.9 MIL/uL — ABNORMAL LOW (ref 4.22–5.81)
RDW: 13.1 % (ref 11.5–15.5)
WBC: 9.2 10*3/uL (ref 4.0–10.5)

## 2018-04-10 LAB — BASIC METABOLIC PANEL
Anion gap: 6 (ref 5–15)
BUN: 42 mg/dL — AB (ref 6–20)
CO2: 25 mmol/L (ref 22–32)
Calcium: 8.5 mg/dL — ABNORMAL LOW (ref 8.9–10.3)
Chloride: 107 mmol/L (ref 98–111)
Creatinine, Ser: 1.93 mg/dL — ABNORMAL HIGH (ref 0.61–1.24)
GFR calc Af Amer: 45 mL/min — ABNORMAL LOW (ref >60)
GFR calc non Af Amer: 39 mL/min — ABNORMAL LOW (ref >60)
GLUCOSE: 189 mg/dL — AB (ref 70–99)
POTASSIUM: 3.7 mmol/L (ref 3.5–5.1)
Sodium: 138 mmol/L (ref 135–145)

## 2018-04-10 LAB — URINALYSIS, ROUTINE W REFLEX MICROSCOPIC
BILIRUBIN URINE: NEGATIVE
Bacteria, UA: NONE SEEN
Glucose, UA: >=500 mg/dL — AB
Hgb urine dipstick: NEGATIVE
Ketones, ur: NEGATIVE mg/dL
Leukocytes, UA: NEGATIVE
Nitrite: NEGATIVE
Protein, ur: >=300 mg/dL — AB
SPECIFIC GRAVITY, URINE: 1.015 (ref 1.005–1.030)
pH: 6 (ref 5.0–8.0)

## 2018-04-10 LAB — BRAIN NATRIURETIC PEPTIDE: B NATRIURETIC PEPTIDE 5: 47.4 pg/mL (ref 0.0–100.0)

## 2018-04-10 LAB — GLUCOSE, CAPILLARY
Glucose-Capillary: 158 mg/dL — ABNORMAL HIGH (ref 70–99)
Glucose-Capillary: 120 mg/dL — ABNORMAL HIGH (ref 70–99)
Glucose-Capillary: 96 mg/dL (ref 70–99)
Glucose-Capillary: 188 mg/dL — ABNORMAL HIGH (ref 70–99)

## 2018-04-10 MED ORDER — INFLUENZA VAC SPLIT QUAD 0.5 ML IM SUSY
0.5000 mL | PREFILLED_SYRINGE | INTRAMUSCULAR | Status: AC
  Administered 2018-04-11: 0.5 mL via INTRAMUSCULAR
  Filled 2018-04-10: qty 0.5

## 2018-04-10 MED ORDER — INSULIN ASPART PROT & ASPART (70-30 MIX) 100 UNIT/ML ~~LOC~~ SUSP
16.0000 [IU] | Freq: Every day | SUBCUTANEOUS | Status: DC
  Administered 2018-04-10 – 2018-04-11 (×2): 16 [IU] via SUBCUTANEOUS
  Filled 2018-04-10: qty 10

## 2018-04-10 NOTE — Progress Notes (Signed)
PROGRESS NOTE    Willie Jordan  YCX:[id number] DOB: 06/30/1966 DOA: 04/09/2018 PCP: Mack Hook, MD   Brief Narrative:   52 year old  man, with a history of hypertension, hyperlipidemia, diabetes type 2, peripheral neuropathy, CKD stage II, CHF, presenting on 04/09/2018 to the emergency department with right foot injury.  He reports a screw was found in the bottom of his right shoe, with a "hole in the foot "because of peripheral neuropathy, he did not feel the pain.  Of note, although Neurontin was helping, he decided to take less of it, (twice a day instead of 3) because he was afraid of addiction.  His white count of the time was 12.7, although he was afebrile.  He was not septic.  X-ray of the foot show soft tissue swelling without evidence of of foreign body, or acute osseous abnormality.  He was given Tdap in the ED, and he is wound with draining serous fluid on his right foot was treated.  He is on Keflex day 1.  Wound care consult is pending.  CKD stage II, baseline creatinine is 1.1-1.2, today was 2.44 with a BUN of 48, likely due to prerenal secondary to dehydration and continuation of ACE inhibitors and diuretics.  He was given 1 L normal saline bolus, then followed by 125 cc an hour with good response  Assessment & Plan:   Principal Problem:   Acute renal failure superimposed on stage 2 chronic kidney disease ([place]) Active Problems:   HLD (hyperlipidemia)   Diabetic neuropathy (HCC)   Type II diabetes mellitus with renal manifestations ([place])   Puncture wound of right foot   Chronic diastolic CHF (congestive heart failure) (HCC)  Acute on chronic CKD stage II, initial creatinine was 2.44, BUN 48, improved with IV fluids at 125 cc an hour, current creatinine is 1.93.  Baseline creatinine is 1.1-1.2 Repeat BMET in am Continue IV fluids Avoid ACE inhibitors or diuretics for now Avoid NSAIDs   Right foot injury, in the setting of peripheral neuropathy.  Area was cleaned.   White count 12. Continue Keflex Continue wound care   Type II Diabetes, with neuropathy current blood sugar level is 189, last hemoglobin A1c in April of this year was 8 Lab Results  Component Value Date   HGBA1C 8.0 (H) 10/13/2017   Continue sliding scale insulin Continue Neurontin, at prescribed dose.  Patient takes less of it at home, but instructed to resume the correct frequency and strength  Hyperlipidemia Continue Crestor and fish oil  Hypertension Resume lisinopril and Lasix when renal functions are improved Continue Norvasc  Chronic diastolic heart failure, without lower extremity edema or shortness of breath.  CHF is compensated, weight is 83.2 kg.  BNP 47.4 Lasix and ACE inhibitor is on hold due to AKI. Continue to monitor Continue to check daily weights, I/O  DVT prophylaxis: SCD Code Status: Full code  Family Communication: None Disposition Plan: Home when clinically stable  Consultants:   None  Procedures:  Wound care  Antimicrobials:   Keflex   Subjective: He denies any shortness of breath, chest pain.  He denies any abdominal pain nausea or vomiting.  He denies any back pain.  He makes less urine than prior, but this has been discussed with his primary care physician by the patient.  Main complaint is right lower extremity, with the right food being in pain due to puncture wound.  Other areas of pain.  Objective: Vitals:   04/09/18 2357 04/10/18 0017 04/10/18 0533 04/10/18 4970  BP: (!) 152/73 (!) 162/81 135/70 (!) 136/57  Pulse: 64 66 66 66  Resp: 18 18  18   Temp:  98.1 F (36.7 C) 98.6 F (37 C) 98.5 F (36.9 C)  TempSrc:  Oral Oral Oral  SpO2: 95% 92% (!) 89% (!) 89%  Weight:  83.2 kg    Height:        Intake/Output Summary (Last 24 hours) at 04/10/2018 1322 Last data filed at 04/10/2018 8341 Gross per 24 hour  Intake 374.78 ml  Output 650 ml  Net -275.22 ml   Filed Weights   04/09/18 1853 04/10/18 0017  Weight: 79.4 kg 83.2 kg      Examination:  General exam: Appears calm and comfortable  Respiratory system: Clear to auscultation. Respiratory effort normal. Cardiovascular system: S1 & S2 heard, RRR. No JVD, murmurs, rubs, gallops or clicks. No pedal edema. Gastrointestinal system: Abdomen is nondistended, soft and nontender. No organomegaly or masses felt. Normal bowel sounds heard. Central nervous system: Alert and oriented. No focal neurological deficits. Extremities: Symmetric 5 x 5 power. Skin: Puncture wound to the plantar aspect of the right foot, with draining serous fluid, nonpurulent, no bleeding. Psychiatry: Judgement and insight appear normal. Mood & affect appropriate.     Data Reviewed: I have personally reviewed following labs and imaging studies  CBC: Recent Labs  Lab 04/09/18 1935 04/10/18 0101  WBC 12.7* 9.2  NEUTROABS 9.7*  --   HGB 10.4* 10.5*  HCT 32.8* 32.5*  MCV 83.5 83.3  PLT 213 962   Basic Metabolic Panel: Recent Labs  Lab 04/09/18 1935 04/10/18 0101  NA 134* 138  K 4.1 3.7  CL 104 107  CO2 23 25  GLUCOSE 174* 189*  BUN 48* 42*  CREATININE 2.44* 1.93*  CALCIUM 8.2* 8.5*   GFR: Estimated Creatinine Clearance: 46 mL/min (A) (by C-G formula based on SCr of 1.93 mg/dL (H)). Liver Function Tests: No results for input(s): AST, ALT, ALKPHOS, BILITOT, PROT, ALBUMIN in the last 168 hours. No results for input(s): LIPASE, AMYLASE in the last 168 hours. No results for input(s): AMMONIA in the last 168 hours. Coagulation Profile: No results for input(s): INR, PROTIME in the last 168 hours. Cardiac Enzymes: No results for input(s): CKTOTAL, CKMB, CKMBINDEX, TROPONINI in the last 168 hours. BNP (last 3 results) No results for input(s): PROBNP in the last 8760 hours. HbA1C: No results for input(s): HGBA1C in the last 72 hours. CBG: Recent Labs  Lab 04/10/18 0731 04/10/18 1126  GLUCAP 158* 120*   Lipid Profile: No results for input(s): CHOL, HDL, LDLCALC, TRIG,  CHOLHDL, LDLDIRECT in the last 72 hours. Thyroid Function Tests: No results for input(s): TSH, T4TOTAL, FREET4, T3FREE, THYROIDAB in the last 72 hours. Anemia Panel: No results for input(s): VITAMINB12, FOLATE, FERRITIN, TIBC, IRON, RETICCTPCT in the last 72 hours. Sepsis Labs: No results for input(s): PROCALCITON, LATICACIDVEN in the last 168 hours.  No results found for this or any previous visit (from the past 240 hour(s)).       Radiology Studies: Dg Foot 2 Views Right  Result Date: 04/09/2018 CLINICAL DATA:  Puncture injury to the plantar aspect of the right foot by a screw. Abnormal sensation at the medial aspect of the calcaneus. Limping. EXAM: RIGHT FOOT - 2 VIEW COMPARISON:  None. FINDINGS: There is no evidence of acute fracture or dislocation. Mild degenerative spurring is noted at the first MTP joint. There is also mild degenerative spurring along the dorsal midfoot, and there are small plantar  and moderate-sized posterior calcaneal enthesophytes. Spurring is noted at the medial and lateral malleoli as well. There is mild diffuse soft tissue swelling throughout the foot extending into the lower leg. No radiopaque foreign body is identified. IMPRESSION: Soft tissue swelling without evidence of radiopaque foreign body or acute osseous abnormality. Electronically Signed   By: Logan Bores M.D.   On: 04/09/2018 19:45        Scheduled Meds: . amLODipine  10 mg Oral Daily  . cephALEXin  500 mg Oral Q8H  . gabapentin  300 mg Oral BID  . [START ON 04/11/2018] Influenza vac split quadrivalent PF  0.5 mL Intramuscular Tomorrow-1000  . insulin aspart  0-9 Units Subcutaneous TID WC  . insulin aspart protamine- aspart  16 Units Subcutaneous Q supper  . insulin aspart protamine- aspart  18 Units Subcutaneous QAC breakfast  . omega-3 acid ethyl esters  1 g Oral Daily  . rosuvastatin  10 mg Oral Daily  . vitamin B-12  1,000 mcg Oral Daily   Continuous Infusions: . sodium chloride 125  mL/hr at 04/10/18 0459     LOS: 0 days        Sharene Butters, MD [facility] Pager [phone] or text via [place] or Amion  If 7PM-7AM, please contact night-coverage [email or web address] Password [email or web address] 04/10/2018, 1:22 PM

## 2018-04-10 NOTE — Progress Notes (Deleted)
Brief Progress Note  52 year old  man, with a history of hypertension, hyperlipidemia, diabetes type 2, peripheral neuropathy, CKD stage II, CHF, presenting on 04/09/2018 to the emergency department with right foot injury.  He reports a screw was found in the bottom of his right shoe, with a "hole in the foot "because of peripheral neuropathy, he did not feel the pain.  Of note, although Neurontin was helping, he decided to take less of it, (twice a day instead of 3) because he was afraid of addiction.  His white count of the time was 12.7, although he was afebrile.  He was not septic.  X-ray of the foot show soft tissue swelling without evidence of of foreign body, or acute osseous abnormality.  He was given Tdap in the ED, and he is wound with draining serous fluid on his right foot was treated.  He is on Keflex day 1.  Wound care consult is pending.   CKD stage II, baseline creatinine is 1.1-1.2, today was 2.44 with a BUN of 48, likely due to prerenal secondary to dehydration and continuation of ACE inhibitors and diuretics.  He was given 1 L normal saline bolus, then followed by 125 cc an hour.  Lasix and lisinopril were held.  He tolerated this well, and his creatinine is beginning to improve, currently at 1.93.  We will repeat a new BMET in a.m.  UA is pending  For diabetes, last A1c in April was 8, he is compliant with his medications.  We will continue SSI.  His neuropathy is being treated with Neurontin.  The patient was instructed to further discuss with his PCP increasing dose.  In the interim, he has been educated of taking Neurontin as directed, 3 times a day, as this could help with his symptoms.  Anticipate discharge on 04/11/2018

## 2018-04-10 NOTE — H&P (Signed)
History and Physical    Willie Jordan GUY:[id number] DOB: 11-Oct-1965 DOA: 04/09/2018   PCP: Mack Hook, MD   Patient coming from:  Home    Chief Complaint: right foot puncture   HPI: Willie Jordan is a 52 y.o. male with a history of hypertension, hyperlipidemia, diabetes type 2, peripheral neuropathy, CKD stage II, CHF, presenting on 04/09/2018 to the emergency department with right foot injury. He reports a screw was found in the bottom of his right shoe, with a "hole in the foot "because of peripheral neuropathy, he did not feel the pain. Of note, although Neurontin was helping, he decided to take less of it, (twice a day instead of 3)because he was afraid of addiction. His white count of the time was 12.7, although he was afebrile. He was not septic. X-ray of the foot show soft tissue swelling without evidence of foreign body, or acute osseous abnormality. He was given Tdap in the ED, and he is wound with draining serous fluid on his right foot was treated. He is on Keflex day 1. Wound care consult is pending.  As for his h/o CKD stage II, baseline creatinine is 1.1-1.2, today was 2.44 with a BUN of 48, likely due to prerenal secondary to dehydration and continuation of ACE inhibitors and diuretics. He was given 1 L normal saline bolus, then followed by 125 cc an hour with good response    Review of Systems: He denies any shortness of breath, chest pain.  He denies any abdominal pain nausea or vomiting.  He denies any back pain.  He makes less urine than prior, but this has been discussed with his primary care physician by the patient.  Main complaint is right lower extremity, with the right food being in pain due to puncture wound.  NO other areas of pain. THe rest of ROS is negative  Past Medical History:  Diagnosis Date  . Diabetic neuropathy ([place]) 03/17/2017  . Hyperlipidemia   . Hypertension   . Microalbuminuria due to type 2 diabetes mellitus ([place]) 01/30/2017  .  Neuropathy   . Onychomycosis of toenail 01/30/2017  . Peripheral neuropathy 03/17/2017  . Type 2 diabetes mellitus, uncontrolled ([place])     Past Surgical History:  Procedure Laterality Date  . AMPUTATION Left 09/29/2016   Procedure: 2nd RAY AMPUTATION LEFT FOOT I&D LEFT FOOT;  Surgeon: Edrick Kins, DPM;  Location: [facility];  Service: Podiatry;  Laterality: Left;  . APPENDECTOMY      Social History Social History   Socioeconomic History  . Marital status: Married    Spouse name: Not on file  . Number of children: Not on file  . Years of education: Not on file  . Highest education level: Not on file  Occupational History  . Occupation: Barista  . Financial resource strain: Not on file  . Food insecurity:    Worry: Not on file    Inability: Not on file  . Transportation needs:    Medical: Not on file    Non-medical: Not on file  Tobacco Use  . Smoking status: Former Smoker    Types: Cigarettes    Start date: 10/31/2015    Last attempt to quit: 01/05/2016    Years since quitting: 2.2  . Smokeless tobacco: Never Used  Substance and Sexual Activity  . Alcohol use: No    Alcohol/week: 0.0 standard drinks    Comment: rarely  . Drug use: No  . Sexual activity: Not Currently  Lifestyle  . Physical activity:    Days per week: Not on file    Minutes per session: Not on file  . Stress: Not on file  Relationships  . Social connections:    Talks on phone: Not on file    Gets together: Not on file    Attends religious service: Not on file    Active member of club or organization: Not on file    Attends meetings of clubs or organizations: Not on file    Relationship status: Not on file  . Intimate partner violence:    Fear of current or ex partner: Not on file    Emotionally abused: Not on file    Physically abused: Not on file    Forced sexual activity: Not on file  Other Topics Concern  . Not on file  Social History Narrative  . Not on file      Allergies  Allergen Reactions  . Aspirin Rash    Family History  Problem Relation Age of Onset  . Diabetes Mother   . Diabetes Sister   . Diabetes Brother   . Cancer Maternal Grandmother        Prior to Admission medications   Medication Sig Start Date End Date Taking? Authorizing Provider  amLODipine (NORVASC) 10 MG tablet 1 tab by mouth daily Patient taking differently: Take 10 mg by mouth daily.  08/04/17  Yes Mack Hook, MD  Cyanocobalamin (VITAMIN B 12 PO) Take 1 tablet by mouth daily.    Yes [provider]  furosemide (LASIX) 40 MG tablet 1-2 tabs by mouth daily for swelling of feet. Patient taking differently: Take 40 mg by mouth daily.  08/04/17  Yes Mack Hook, MD  gabapentin (NEURONTIN) 300 MG capsule Take 1 capsule (300 mg total) by mouth 3 (three) times daily. Patient taking differently: Take 300 mg by mouth 2 (two) times daily.  02/19/18  Yes Mack Hook, MD  Insulin Lispro Prot & Lispro (HUMALOG MIX 75/25 KWIKPEN) (75-25) 100 UNIT/ML Kwikpen Inject 26 units before breakfast and 24 units before dinner daily Patient taking differently: Inject 24-26 Units into the skin See admin instructions. Inject 26 units before breakfast and 24 units before dinner daily 08/04/17  Yes Mack Hook, MD  lisinopril (PRINIVIL,ZESTRIL) 20 MG tablet 1 tab by mouth daily Patient taking differently: Take 20 mg by mouth daily.  02/10/18  Yes Mack Hook, MD  metFORMIN (GLUCOPHAGE-XR) 500 MG 24 hr tablet 2 tabs by mouth twice daily with meals Patient taking differently: Take 1,000 mg by mouth 2 (two) times daily.  10/28/17  Yes Mack Hook, MD  Omega-3 Fatty Acids (FISH OIL PO) Take 1 capsule by mouth daily.    Yes [provider]  rosuvastatin (CRESTOR) 10 MG tablet Take 1 tablet (10 mg total) by mouth daily. 02/21/18  Yes Mack Hook, MD  blood glucose meter kit and supplies Dispense based on patient and insurance  preference. Use up to four times daily as directed. (FOR ICD-9 250.00, 250.01). 10/05/16   Patrecia Pour, MD  Insulin Pen Needle 31G X 5 MM MISC Use as directed twice daily 01/12/17   Mack Hook, MD     Physical Exam:  Vitals:   04/09/18 2357 04/10/18 0017 04/10/18 0533 04/10/18 0735  BP: (!) 152/73 (!) 162/81 135/70 (!) 136/57  Pulse: 64 66 66 66  Resp: _0 Temp:  98.1 F (36.7 C) 98.6 F (37 C) 98.5 F (36.9 C)  TempSrc:  Oral Oral Oral  SpO2: 95% 92% (!) 89% (!) 89%  Weight:  83.2 kg    Height:       Constitutional: NAD, calm, comfortable   Eyes: PERRL, lids and conjunctivae normal ENMT: Mucous membranes are moist, without exudate or lesions  Neck: normal, supple, no masses, no thyromegaly Respiratory: clear to auscultation bilaterally, no wheezing, no crackles. Normal respiratory effort  Cardiovascular: Regular rate and rhythm,  murmur, rubs or gallops. No extremity edema. 2+ pedal pulses. No carotid bruits.  Abdomen: Soft, non tender, No hepatosplenomegaly. Bowel sounds positive.  Musculoskeletal: no clubbing / cyanosis. Moves all extremities Skin: Puncture wound to the plantar aspect of the right foot, with draining serous fluid, nonpurulent, no bleeding. Skin very dry..  Neurologic: Sensation intact  Strength equal in all extremities Psychiatric:   Alert and oriented x 3. Normal mood.     Labs on Admission: I have personally reviewed following labs and imaging studies  CBC: Recent Labs  Lab 04/09/18 1935 04/10/18 0101  WBC 12.7* 9.2  NEUTROABS 9.7*  --   HGB 10.4* 10.5*  HCT 32.8* 32.5*  MCV 83.5 83.3  PLT 213 960    Basic Metabolic Panel: Recent Labs  Lab 04/09/18 1935 04/10/18 0101  NA 134* 138  K 4.1 3.7  CL 104 107  CO2 23 25  GLUCOSE 174* 189*  BUN 48* 42*  CREATININE 2.44* 1.93*  CALCIUM 8.2* 8.5*    GFR: Estimated Creatinine Clearance: 46 mL/min (A) (by C-G formula based on SCr of 1.93 mg/dL (H)).  Liver Function Tests: No  results for input(s): AST, ALT, ALKPHOS, BILITOT, PROT, ALBUMIN in the last 168 hours. No results for input(s): LIPASE, AMYLASE in the last 168 hours. No results for input(s): AMMONIA in the last 168 hours.  Coagulation Profile: No results for input(s): INR, PROTIME in the last 168 hours.  Cardiac Enzymes: No results for input(s): CKTOTAL, CKMB, CKMBINDEX, TROPONINI in the last 168 hours.  BNP (last 3 results) No results for input(s): PROBNP in the last 8760 hours.  HbA1C: No results for input(s): HGBA1C in the last 72 hours.  CBG: Recent Labs  Lab 04/10/18 0731 04/10/18 1126  GLUCAP 158* 120*    Lipid Profile: No results for input(s): CHOL, HDL, LDLCALC, TRIG, CHOLHDL, LDLDIRECT in the last 72 hours.  Thyroid Function Tests: No results for input(s): TSH, T4TOTAL, FREET4, T3FREE, THYROIDAB in the last 72 hours.  Anemia Panel: No results for input(s): VITAMINB12, FOLATE, FERRITIN, TIBC, IRON, RETICCTPCT in the last 72 hours.  Urine analysis:    Component Value Date/Time   COLORURINE YELLOW 04/10/2018 0910   APPEARANCEUR CLEAR 04/10/2018 0910   LABSPEC 1.015 04/10/2018 0910   PHURINE 6.0 04/10/2018 0910   GLUCOSEU >=500 (A) 04/10/2018 0910   HGBUR NEGATIVE 04/10/2018 0910   BILIRUBINUR NEGATIVE 04/10/2018 0910   BILIRUBINUR Negative 12/31/2015 1310   KETONESUR NEGATIVE 04/10/2018 0910   PROTEINUR >=300 (A) 04/10/2018 0910   UROBILINOGEN 0.2 12/31/2015 1310   UROBILINOGEN 4.0 (H) 05/29/2007 1609   NITRITE NEGATIVE 04/10/2018 0910   LEUKOCYTESUR NEGATIVE 04/10/2018 0910    Sepsis Labs: _0 (procalcitonin:4,lacticidven:4) )No results found for this or any previous visit (from the past 240 hour(s)).   Radiological Exams on Admission: Dg Foot 2 Views Right  Result Date: 04/09/2018 CLINICAL DATA:  Puncture injury to the plantar aspect of the right foot by a screw. Abnormal sensation at the medial aspect of the calcaneus. Limping. EXAM: RIGHT FOOT - 2 VIEW  COMPARISON:  None.  FINDINGS: There is no evidence of acute fracture or dislocation. Mild degenerative spurring is noted at the first MTP joint. There is also mild degenerative spurring along the dorsal midfoot, and there are small plantar and moderate-sized posterior calcaneal enthesophytes. Spurring is noted at the medial and lateral malleoli as well. There is mild diffuse soft tissue swelling throughout the foot extending into the lower leg. No radiopaque foreign body is identified. IMPRESSION: Soft tissue swelling without evidence of radiopaque foreign body or acute osseous abnormality. Electronically Signed   By: Logan Bores M.D.   On: 04/09/2018 19:45    EKG: Independently reviewed.  Assessment/Plan Principal Problem:   Acute renal failure superimposed on stage 2 chronic kidney disease (HCC) Active Problems:   HLD (hyperlipidemia)   Diabetic neuropathy (HCC)   Type II diabetes mellitus with renal manifestations (HCC)   Puncture wound of right foot   Chronic diastolic CHF (congestive heart failure) (HCC)   Acute on chronic CKD stage II, initial creatinine was 2.44, BUN 48, improved with IV fluids at 125 cc an hour, current creatinine is 1.93.  Baseline creatinine is 1.1-1.2 Repeat BMET in am Continue IV fluids Avoid ACE inhibitors or diuretics for now Avoid NSAIDs   Right foot injury, in the setting of peripheral neuropathy.  Area was cleaned.  White count 12. Continue Keflex Continue wound care   Type II Diabetes, with neuropathy current blood sugar level is 189, last hemoglobin A1c in April of this year was 8 RecentLabs       Lab Results  Component Value Date   HGBA1C 8.0 (H) 10/13/2017     Continue sliding scale insulin Continue Neurontin, at prescribed dose.  Patient takes less of it at home, but instructed to resume the correct frequency and strength  Hyperlipidemia Continue Crestor and fish oil  Hypertension Resume lisinopril and Lasix when renal functions  are improved Continue Norvasc  Chronic diastolic heart failure, without lower extremity edema or shortness of breath.  CHF is compensated, weight is 83.2 kg.  BNP 47.4 Lasix and ACE inhibitor is on hold due to AKI. Continue to monitor Continue to check daily weights, I/O   DVT prophylaxis:  SCD Code Status:    Full Code Family Communication:  Discussed with patient Disposition Plan: Expect patient to be discharged to home after condition improves Consults called:    None Admission status: Home when clinically stable   Sharene Butters, PA-C [facility]   Amion text  [phone]   04/10/2018, 2:37 PM

## 2018-04-11 DIAGNOSIS — I5032 Chronic diastolic (congestive) heart failure: Secondary | ICD-10-CM

## 2018-04-11 DIAGNOSIS — E1142 Type 2 diabetes mellitus with diabetic polyneuropathy: Secondary | ICD-10-CM

## 2018-04-11 DIAGNOSIS — N179 Acute kidney failure, unspecified: Principal | ICD-10-CM

## 2018-04-11 LAB — GLUCOSE, CAPILLARY
Glucose-Capillary: 91 mg/dL (ref 70–99)
Glucose-Capillary: 127 mg/dL — ABNORMAL HIGH (ref 70–99)
Glucose-Capillary: 122 mg/dL — ABNORMAL HIGH (ref 70–99)
Glucose-Capillary: 95 mg/dL (ref 70–99)

## 2018-04-11 LAB — BASIC METABOLIC PANEL
Anion gap: 5 (ref 5–15)
BUN: 28 mg/dL — AB (ref 6–20)
CALCIUM: 8 mg/dL — AB (ref 8.9–10.3)
CO2: 22 mmol/L (ref 22–32)
CREATININE: 1.36 mg/dL — AB (ref 0.61–1.24)
Chloride: 111 mmol/L (ref 98–111)
GFR calc Af Amer: >60 mL/min (ref >60)
GFR calc non Af Amer: 60 mL/min — ABNORMAL LOW (ref >60)
Glucose, Bld: 113 mg/dL — ABNORMAL HIGH (ref 70–99)
Potassium: 4.2 mmol/L (ref 3.5–5.1)
Sodium: 138 mmol/L (ref 135–145)

## 2018-04-11 MED ORDER — SODIUM CHLORIDE 0.9 % IV SOLN
INTRAVENOUS | Status: AC
  Administered 2018-04-11: 13:00:00 via INTRAVENOUS

## 2018-04-11 NOTE — Progress Notes (Signed)
PROGRESS NOTE    Willie Jordan  ZYS:[id number] DOB: 1966-04-04 DOA: 04/09/2018 PCP: Mack Hook, MD  Brief Narrative: Willie Jordan is a 52 y.o. male with medical history significant of hypertension, hyperlipidemia, diabetes mellitus, peripheral neuropathy, CKD-2, CHF, who presented with right foot injury. Pt stated that he noted right foot injury by a screw on the bottom of his right shoe piercing into the sole of the shoe, currently noted a wound on the plantar aspect of his right foot with some bleeding swelling and serous discharge. -Sent into the emergency room was noted to have mild leukocytosis and cellulitis, no foreign body, also noted to have worsening of her kidney function   Assessment & Plan:   Principal Problem:   Acute renal failure superimposed on stage 2 chronic kidney disease ([place]) -Baseline creatinine is 1.1 -Creatinine 2.4 on admission likely prerenal, worsened by concomitant ACE inhibitor and diuretic use -Improving, lisinopril and diuretic on hold, continue IV fluids today, cut down rate  Cellulitis of right foot following puncture wound -Minimal purulence noted at the base of the foot, 5 mm ulcer -Has diabetic insensate neuropathy -Continue oral Keflex, -Set up with case management, to follow-up at the wound clinic  Type 2 diabetes -Poorly controlled, last hemoglobin A1c is 8.0 -Continue insulin 75/25 -CBGs are stable  Chronic diastolic CHF -Clinically getting close to euvolemic at this time, Lasix on hold -Gentle IV fluids for few more hours  DVT prophylaxis: SCDs Code Status: Full code Family Communication: no Family at bedside Disposition Plan: Home tomorrow if stable  Consultants:      Procedures:   Antimicrobials:    Subjective: -Okay, no significant changes in the foot  Objective: Vitals:   04/10/18 1820 04/10/18 2027 04/11/18 0507 04/11/18 0754  BP: (!) 150/83 (!) 139/54 (!) 140/55 135/66  Pulse: 73 74 72 71  Resp:  18 18  18   Temp:  98.3 F (36.8 C) 98.4 F (36.9 C) 98.9 F (37.2 C)  TempSrc:   Oral Oral  SpO2:  91% 97% (!) 89%  Weight:      Height:        Intake/Output Summary (Last 24 hours) at 04/11/2018 1139 Last data filed at 04/11/2018 0900 Gross per 24 hour  Intake 3318.01 ml  Output 1350 ml  Net 1968.01 ml   Filed Weights   04/09/18 1853 04/10/18 0017  Weight: 79.4 kg 83.2 kg    Examination:  General exam: Appears calm and comfortable, no distress Respiratory system: Clear to auscultation. Respiratory effort normal. Cardiovascular system: S1 & S2 heard, RRR. No JVD, murmurs, rubs, gallops Gastrointestinal system: Abdomen is nondistended, soft and nontender.Normal bowel sounds heard. Central nervous system: Alert and oriented. No focal neurological deficits. Extremities: No edema, right foot plantar surface with 5 mm wound with minimal purulence, peripheral neuropathy noted Skin: As above Psychiatry: Judgement and insight appear normal. Mood & affect appropriate.     Data Reviewed:   CBC: Recent Labs  Lab 04/09/18 1935 04/10/18 0101  WBC 12.7* 9.2  NEUTROABS 9.7*  --   HGB 10.4* 10.5*  HCT 32.8* 32.5*  MCV 83.5 83.3  PLT 213 932   Basic Metabolic Panel: Recent Labs  Lab 04/09/18 1935 04/10/18 0101 04/11/18 0810  NA 134* 138 138  K 4.1 3.7 4.2  CL 104 107 111  CO2 23 25 22   GLUCOSE 174* 189* 113*  BUN 48* 42* 28*  CREATININE 2.44* 1.93* 1.36*  CALCIUM 8.2* 8.5* 8.0*   GFR: Estimated Creatinine Clearance: 65.2  mL/min (A) (by C-G formula based on SCr of 1.36 mg/dL (H)). Liver Function Tests: No results for input(s): AST, ALT, ALKPHOS, BILITOT, PROT, ALBUMIN in the last 168 hours. No results for input(s): LIPASE, AMYLASE in the last 168 hours. No results for input(s): AMMONIA in the last 168 hours. Coagulation Profile: No results for input(s): INR, PROTIME in the last 168 hours. Cardiac Enzymes: No results for input(s): CKTOTAL, CKMB, CKMBINDEX, TROPONINI in  the last 168 hours. BNP (last 3 results) No results for input(s): PROBNP in the last 8760 hours. HbA1C: No results for input(s): HGBA1C in the last 72 hours. CBG: Recent Labs  Lab 04/10/18 0731 04/10/18 1126 04/10/18 1629 04/10/18 2029 04/11/18 0756  GLUCAP 158* 120* 96 188* 91   Lipid Profile: No results for input(s): CHOL, HDL, LDLCALC, TRIG, CHOLHDL, LDLDIRECT in the last 72 hours. Thyroid Function Tests: No results for input(s): TSH, T4TOTAL, FREET4, T3FREE, THYROIDAB in the last 72 hours. Anemia Panel: No results for input(s): VITAMINB12, FOLATE, FERRITIN, TIBC, IRON, RETICCTPCT in the last 72 hours. Urine analysis:    Component Value Date/Time   COLORURINE YELLOW 04/10/2018 0910   APPEARANCEUR CLEAR 04/10/2018 0910   LABSPEC 1.015 04/10/2018 0910   PHURINE 6.0 04/10/2018 0910   GLUCOSEU >=500 (A) 04/10/2018 0910   HGBUR NEGATIVE 04/10/2018 0910   BILIRUBINUR NEGATIVE 04/10/2018 0910   BILIRUBINUR Negative 12/31/2015 1310   KETONESUR NEGATIVE 04/10/2018 0910   PROTEINUR >=300 (A) 04/10/2018 0910   UROBILINOGEN 0.2 12/31/2015 1310   UROBILINOGEN 4.0 (H) 05/29/2007 1609   NITRITE NEGATIVE 04/10/2018 0910   LEUKOCYTESUR NEGATIVE 04/10/2018 0910   Sepsis Labs: @LABRCNTIP (procalcitonin:4,lacticidven:4)  )No results found for this or any previous visit (from the past 240 hour(s)).       Radiology Studies: Dg Foot 2 Views Right  Result Date: 04/09/2018 CLINICAL DATA:  Puncture injury to the plantar aspect of the right foot by a screw. Abnormal sensation at the medial aspect of the calcaneus. Limping. EXAM: RIGHT FOOT - 2 VIEW COMPARISON:  None. FINDINGS: There is no evidence of acute fracture or dislocation. Mild degenerative spurring is noted at the first MTP joint. There is also mild degenerative spurring along the dorsal midfoot, and there are small plantar and moderate-sized posterior calcaneal enthesophytes. Spurring is noted at the medial and lateral malleoli as  well. There is mild diffuse soft tissue swelling throughout the foot extending into the lower leg. No radiopaque foreign body is identified. IMPRESSION: Soft tissue swelling without evidence of radiopaque foreign body or acute osseous abnormality. Electronically Signed   By: Logan Bores M.D.   On: 04/09/2018 19:45        Scheduled Meds: . amLODipine  10 mg Oral Daily  . cephALEXin  500 mg Oral Q8H  . gabapentin  300 mg Oral BID  . insulin aspart  0-9 Units Subcutaneous TID WC  . insulin aspart protamine- aspart  16 Units Subcutaneous Q supper  . insulin aspart protamine- aspart  18 Units Subcutaneous QAC breakfast  . omega-3 acid ethyl esters  1 g Oral Daily  . rosuvastatin  10 mg Oral Daily  . vitamin B-12  1,000 mcg Oral Daily   Continuous Infusions: . sodium chloride       LOS: 1 day    Time spent: 74min    Domenic Polite, MD [facility] Page via [email or web address], password [email or web address] After 7PM please contact night-coverage  04/11/2018, 11:39 AM

## 2018-04-12 DIAGNOSIS — S91331A Puncture wound without foreign body, right foot, initial encounter: Secondary | ICD-10-CM

## 2018-04-12 LAB — BASIC METABOLIC PANEL
Anion gap: 8 (ref 5–15)
BUN: 26 mg/dL — ABNORMAL HIGH (ref 6–20)
CHLORIDE: 108 mmol/L (ref 98–111)
CO2: 22 mmol/L (ref 22–32)
CREATININE: 1.36 mg/dL — AB (ref 0.61–1.24)
Calcium: 7.9 mg/dL — ABNORMAL LOW (ref 8.9–10.3)
GFR calc Af Amer: >60 mL/min (ref >60)
GFR calc non Af Amer: 60 mL/min — ABNORMAL LOW (ref >60)
Glucose, Bld: 92 mg/dL (ref 70–99)
Potassium: 4.2 mmol/L (ref 3.5–5.1)
Sodium: 138 mmol/L (ref 135–145)

## 2018-04-12 LAB — CBC
HCT: 29.4 % — ABNORMAL LOW (ref 39.0–52.0)
HEMOGLOBIN: 9.5 g/dL — AB (ref 13.0–17.0)
MCH: 26.7 pg (ref 26.0–34.0)
MCHC: 32.3 g/dL (ref 30.0–36.0)
MCV: 82.6 fL (ref 78.0–100.0)
Platelets: 167 10*3/uL (ref 150–400)
RBC: 3.56 MIL/uL — AB (ref 4.22–5.81)
RDW: 12.8 % (ref 11.5–15.5)
WBC: 9 10*3/uL (ref 4.0–10.5)

## 2018-04-12 LAB — GLUCOSE, CAPILLARY
GLUCOSE-CAPILLARY: 140 mg/dL — AB (ref 70–99)
GLUCOSE-CAPILLARY: 159 mg/dL — AB (ref 70–99)
Glucose-Capillary: 107 mg/dL — ABNORMAL HIGH (ref 70–99)

## 2018-04-12 MED ORDER — CEPHALEXIN 500 MG PO CAPS: 500.0000 mg | ORAL_CAPSULE | Freq: Three times a day (TID) | ORAL | 0 refills | Status: AC

## 2018-04-12 MED ORDER — FUROSEMIDE 40 MG PO TABS: 40.0000 mg | ORAL_TABLET | Freq: Every day | ORAL | Status: DC

## 2018-04-12 MED ORDER — MUPIROCIN CALCIUM 2 % EX CREA
TOPICAL_CREAM | Freq: Two times a day (BID) | CUTANEOUS | Status: DC
  Administered 2018-04-12: 09:00:00 via TOPICAL
  Filled 2018-04-12: qty 15

## 2018-04-12 MED ORDER — LISINOPRIL 20 MG PO TABS: 10.0000 mg | ORAL_TABLET | Freq: Every day | ORAL | 3 refills | Status: DC

## 2018-04-12 NOTE — Care Management (Addendum)
Update:  [facility] will accept pt via charity.  Pt will get his prescription filled at [facility] - pt said he can afford cost.  PTA Independent from home  CM made referral to [facility] for [facility] via charity.  Agency to follow up with CM regarding acceptance/rejection of referral  Unit secretary will arrange wound care clinic follow up and document on AVS.  Pt is active with the [facility] and utilizes [facility] for prescription. Elenor Quinones, RN, BSN [phone]

## 2018-04-12 NOTE — Plan of Care (Signed)
  Problem: Education: Goal: Knowledge of General Education information will improve Description Including pain rating scale, medication(s)/side effects and non-pharmacologic comfort measures Outcome: Adequate for Discharge   Problem: Health Behavior/Discharge Planning: Goal: Ability to manage health-related needs will improve Outcome: Adequate for Discharge   Problem: Clinical Measurements: Goal: Ability to maintain clinical measurements within normal limits will improve Outcome: Adequate for Discharge Goal: Will remain free from infection Outcome: Adequate for Discharge Goal: Diagnostic test results will improve Outcome: Adequate for Discharge Goal: Respiratory complications will improve Outcome: Adequate for Discharge Goal: Cardiovascular complication will be avoided Outcome: Adequate for Discharge   Problem: Activity: Goal: Risk for activity intolerance will decrease Outcome: Adequate for Discharge   Problem: Coping: Goal: Level of anxiety will decrease Outcome: Adequate for Discharge   Problem: Skin Integrity: Goal: Risk for impaired skin integrity will decrease Outcome: Adequate for Discharge

## 2018-04-12 NOTE — Consult Note (Addendum)
[place] Nurse wound consult note Patient evaluated in [facility] [phone].  No family present. Reason for Consult: Puncture wound to plantar surface, midfoot on Friday, by a sharp object that penetrated through his shoe.  Patient is diabetic with peripheral neuropath. Wound type: Traumatic injury Measurement:1.3 cm x 1 cm x 0.5 cm Wound bed: Covered with light colored slough.  I was able to penetrate the head of the cotton tipped applicator through the slough 0.5 cm.  The wound could be deeper, I simply am unsure. Drainage (amount, consistency, odor) Serosanginous on the gauze dressing. Periwound: Erythematous from the plantar surface, around the puncture site, upwards to the dorsal surface of the foot.  This reddened discoloration is concerning to me, and the area of this should be monitored for worsening.  I was unable to express any fluids from the wound.   Dressing procedure/placement/frequency: Apply mupirocin to right foot puncture wound.  Cover with a large bandaid. Monitor the wound area(s) for worsening of condition such as: Signs/symptoms of infection,  Increase in size,  Development of or worsening of odor, Development of pain, or increased pain at the affected locations.  Notify the medical team if any of these develop.  Thank you for the consult.  Discussed plan of care with the patient and bedside nurse.  [place] nurse will not follow at this time.  Please re-consult the [place] team if needed.  Val Riles, RN, MSN, CWOCN, CNS-BC, pager [phone]

## 2018-04-12 NOTE — Discharge Summary (Signed)
Physician Discharge Summary  Willie Jordan VXB:[id number] DOB: 13-Nov-1965 DOA: 04/09/2018  PCP: Mack Hook, MD  Admit date: 04/09/2018 Discharge date: 04/12/2018  Time spent: 35 minutes  Recommendations for Outpatient Follow-up:  PCP Dr.Mulberry in 1 week Home health RN [facility] 10/18  Discharge Diagnoses:  Principal Problem:   Acute renal failure superimposed on stage 2 chronic kidney disease ([place]) Active Problems:   HLD (hyperlipidemia)   Diabetic neuropathy (HCC)   Type II diabetes mellitus with renal manifestations ([place])   Puncture wound of right foot   Chronic diastolic CHF (congestive heart failure) ([place])   Discharge Condition: stable  Diet recommendation: diabetic, low sodium  Filed Weights   04/09/18 1853 04/10/18 0017 04/11/18 2152  Weight: 79.4 kg 83.2 kg 85.7 kg    History of present illness:  Willie Jordan a 52 y.o.malewith medical history significant ofhypertension, hyperlipidemia, diabetes mellitus, peripheral neuropathy, CKD-2, CHF, who presented with right foot injury. Pt stated that he noted right foot injury by a screw onthe bottom of his right shoe piercing into the sole of the shoe, currently noted a wound on the plantar aspect of his right foot with some bleeding swelling and serous discharge. -Sent into the emergency room was noted to have mild leukocytosis and cellulitis, no foreign body, also noted to have worsening of her kidney function  Hospital Course:   Acute renal failure superimposed on stage 2 chronic kidney disease (HCC) -Baseline creatinine is 1.1 -Creatinine 2.4 on admission likely prerenal, worsened by concomitant ACE inhibitor and diuretic use -improved with hydration, creatinine down to 1.3, resumed Lisinopril,, lowered dose at discharge to '10mg'$  daily  Cellulitis of right foot following puncture wound -Minimal purulence noted at the base of the foot, 5 mm ulcer -Has diabetic insensate  neuropathy -treated with oral Keflex, -Set up with case management, to follow-up at the wound clinic, and PCP next week  Type 2 diabetes -Poorly controlled, last hemoglobin A1c is 8.0 -Continue insulin 75/25 -CBGs were stable inpatient  Chronic diastolic CHF -euvolemic at discharge, resumed lasix    Discharge Exam: Vitals:   04/12/18 0435 04/12/18 0739  BP: 139/60 (!) 141/71  Pulse: 77 70  Resp: 18 18  Temp: 99.5 F (37.5 C) 99.4 F (37.4 C)  SpO2: 94% (!) 80%    General: AAOx3 Cardiovascular: S1S2/RRR Respiratory: CTAB  Discharge Instructions   Discharge Instructions    Diet - low sodium heart healthy   Complete by:  As directed    Diet Carb Modified   Complete by:  As directed    Increase activity slowly   Complete by:  As directed      Allergies as of 04/12/2018      Reactions   Aspirin Rash      Medication List    TAKE these medications   amLODipine 10 MG tablet Commonly known as:  NORVASC 1 tab by mouth daily What changed:    how much to take  how to take this  when to take this  additional instructions   blood glucose meter kit and supplies Dispense based on patient and insurance preference. Use up to four times daily as directed. (FOR ICD-9 250.00, 250.01).   cephALEXin 500 MG capsule Commonly known as:  KEFLEX Take 1 capsule (500 mg total) by mouth 3 (three) times daily for 3 days.   FISH OIL PO Take 1 capsule by mouth daily.   furosemide 40 MG tablet Commonly known as:  LASIX Take 1 tablet (40 mg  total) by mouth daily.   gabapentin 300 MG capsule Commonly known as:  NEURONTIN Take 1 capsule (300 mg total) by mouth 3 (three) times daily. What changed:  when to take this   Insulin Lispro Prot & Lispro (75-25) 100 UNIT/ML Kwikpen Commonly known as:  HUMALOG 75/25 MIX Inject 26 units before breakfast and 24 units before dinner daily What changed:    how much to take  how to take this  when to take this   Insulin Pen  Needle 31G X 5 MM Misc Use as directed twice daily   lisinopril 20 MG tablet Commonly known as:  PRINIVIL,ZESTRIL Take 0.5 tablets (10 mg total) by mouth daily. What changed:    how much to take  how to take this  when to take this  additional instructions   metFORMIN 500 MG 24 hr tablet Commonly known as:  GLUCOPHAGE-XR 2 tabs by mouth twice daily with meals What changed:    how much to take  how to take this  when to take this  additional instructions   rosuvastatin 10 MG tablet Commonly known as:  CRESTOR Take 1 tablet (10 mg total) by mouth daily.   VITAMIN B 12 PO Take 1 tablet by mouth daily.      Allergies  Allergen Reactions  . Aspirin Rash   Follow-up Information    Mack Hook, MD. Schedule an appointment as soon as possible for a visit in 1 week(s).   Specialty:  Internal Medicine Contact information: [place] [address] [phone]            The results of significant diagnostics from this hospitalization (including imaging, microbiology, ancillary and laboratory) are listed below for reference.    Significant Diagnostic Studies: Dg Foot 2 Views Right  Result Date: 04/09/2018 CLINICAL DATA:  Puncture injury to the plantar aspect of the right foot by a screw. Abnormal sensation at the medial aspect of the calcaneus. Limping. EXAM: RIGHT FOOT - 2 VIEW COMPARISON:  None. FINDINGS: There is no evidence of acute fracture or dislocation. Mild degenerative spurring is noted at the first MTP joint. There is also mild degenerative spurring along the dorsal midfoot, and there are small plantar and moderate-sized posterior calcaneal enthesophytes. Spurring is noted at the medial and lateral malleoli as well. There is mild diffuse soft tissue swelling throughout the foot extending into the lower leg. No radiopaque foreign body is identified. IMPRESSION: Soft tissue swelling without evidence of radiopaque foreign body or acute  osseous abnormality. Electronically Signed   By: Logan Bores M.D.   On: 04/09/2018 19:45    Microbiology: Recent Results (from the past 240 hour(s))  Culture, blood (Routine X 2) w Reflex to ID Panel     Status: None (Preliminary result)   Collection Time: 04/10/18 12:01 AM  Result Value Ref Range Status   Specimen Description BLOOD RIGHT ANTECUBITAL  Final   Special Requests   Final    BOTTLES DRAWN AEROBIC ONLY Blood Culture adequate volume   Culture   Final    NO GROWTH 2 DAYS Performed at [facility], [address], [place], [place] [address]    Report Status PENDING  Incomplete  Culture, blood (Routine X 2) w Reflex to ID Panel     Status: None (Preliminary result)   Collection Time: 04/10/18 12:55 AM  Result Value Ref Range Status   Specimen Description BLOOD RIGHT ANTECUBITAL  Final   Special Requests   Final  BOTTLES DRAWN AEROBIC ONLY Blood Culture adequate volume   Culture   Final    NO GROWTH 2 DAYS Performed at [facility], [address] [place], [place] [address]    Report Status PENDING  Incomplete     Labs: Basic Metabolic Panel: Recent Labs  Lab 04/09/18 1935 04/10/18 0101 04/11/18 0810 04/12/18 0342  NA 134* 138 138 138  K 4.1 3.7 4.2 4.2  CL 104 107 111 108  CO2 _0 GLUCOSE 174* 189* 113* 92  BUN 48* 42* 28* 26*  CREATININE 2.44* 1.93* 1.36* 1.36*  CALCIUM 8.2* 8.5* 8.0* 7.9*   Liver Function Tests: No results for input(s): AST, ALT, ALKPHOS, BILITOT, PROT, ALBUMIN in the last 168 hours. No results for input(s): LIPASE, AMYLASE in the last 168 hours. No results for input(s): AMMONIA in the last 168 hours. CBC: Recent Labs  Lab 04/09/18 1935 04/10/18 0101 04/12/18 0342  WBC 12.7* 9.2 9.0  NEUTROABS 9.7*  --   --   HGB 10.4* 10.5* 9.5*  HCT 32.8* 32.5* 29.4*  MCV 83.5 83.3 82.6  PLT 213 180 167   Cardiac Enzymes: No results for input(s): CKTOTAL, CKMB, CKMBINDEX, TROPONINI in the last 168 hours. BNP: BNP  (last 3 results) Recent Labs    07/17/17 0711 04/10/18 0101  BNP 107.3* 47.4    ProBNP (last 3 results) No results for input(s): PROBNP in the last 8760 hours.  CBG: Recent Labs  Lab 04/11/18 1139 04/11/18 1629 04/11/18 2149 04/12/18 0740 04/12/18 1145  GLUCAP 127* 122* 95 107* 140*       Signed:  Domenic Polite MD.  [facility] 04/12/2018, 4:59 PM

## 2018-04-15 LAB — CULTURE, BLOOD (ROUTINE X 2)
CULTURE: NO GROWTH
Culture: NO GROWTH
SPECIAL REQUESTS: ADEQUATE
Special Requests: ADEQUATE

## 2018-04-16 ENCOUNTER — Ambulatory Visit: Payer: Self-pay | Admitting: Internal Medicine

## 2018-04-16 ENCOUNTER — Encounter: Payer: Self-pay | Admitting: Internal Medicine

## 2018-04-16 VITALS — BP 180/100 | HR 72 | Resp 12 | Ht 66.25 in | Wt 185.0 lb

## 2018-04-16 DIAGNOSIS — E782 Mixed hyperlipidemia: Secondary | ICD-10-CM

## 2018-04-16 DIAGNOSIS — I10 Essential (primary) hypertension: Secondary | ICD-10-CM

## 2018-04-16 DIAGNOSIS — N189 Chronic kidney disease, unspecified: Secondary | ICD-10-CM

## 2018-04-16 DIAGNOSIS — Z794 Long term (current) use of insulin: Principal | ICD-10-CM

## 2018-04-16 DIAGNOSIS — R600 Localized edema: Secondary | ICD-10-CM

## 2018-04-16 DIAGNOSIS — S91331S Puncture wound without foreign body, right foot, sequela: Secondary | ICD-10-CM

## 2018-04-16 DIAGNOSIS — R609 Edema, unspecified: Secondary | ICD-10-CM

## 2018-04-16 DIAGNOSIS — E1165 Type 2 diabetes mellitus with hyperglycemia: Secondary | ICD-10-CM

## 2018-04-16 LAB — GLUCOSE, POCT (MANUAL RESULT ENTRY): POC GLUCOSE: 100 mg/dL — AB (ref 70–99)

## 2018-04-16 MED ORDER — AMOXICILLIN-POT CLAVULANATE 875-125 MG PO TABS: 1.0000 | ORAL_TABLET | Freq: Two times a day (BID) | ORAL | 0 refills | Status: DC

## 2018-04-16 MED ORDER — LISINOPRIL 20 MG PO TABS: 10.0000 mg | ORAL_TABLET | Freq: Every day | ORAL | 3 refills | Status: DC

## 2018-04-16 MED ORDER — FUROSEMIDE 40 MG PO TABS: ORAL_TABLET | ORAL | Status: DC

## 2018-04-16 NOTE — Patient Instructions (Signed)
Change the gauze closest to your foot twice daily--wash the foot with soapy water and rinse afterward, pat dry, then apply the antibiotic ointment to the wound with a new 4 x 4 and reapply entire dressing to hold it in place  Keep you legs up at all times unless you need to get up to eat or go to the bathroom.

## 2018-04-16 NOTE — Addendum Note (Signed)
Addended by: Marcelino Duster on: 04/16/2018 11:32 AM   Modules accepted: Orders

## 2018-04-16 NOTE — Progress Notes (Signed)
Subjective:    Patient ID: Willie Jordan, male    DOB: Sep 07, 1965, 52 y.o.   MRN: [id number]  HPI   Hospitalized from 04/09/18-04/11/18.  Apparently stepped on a screw at work, which he was unaware of until he returned home from work that evening and saw the blood on his sock. Continued on Cephalexin since discharge with last dose finished in the evening.Marland Kitchen He did have home health wound care set up, but he told them he needed to work, and as he gets back from work so late, they cannot come out.   He has a wound care appt in [place], but not until next Friday, a week from now. He states he has been elevating legs.  Hard to get a good idea when he feet became so swollen.  Only taking Furosemide at night then up 3-4 times to urinate.  Stopped in the morning as did not seem to cause him to urinate.  He went directly to the hospital where he was also found to have worsening of acute on chronic kidney disease.  Not clear what caused this as he states he was staying well hydrated.  This improved with hydration and holding ACE I /diuretic In the hospital. Lower dose Lisinopril restarted at 10 mg daily.    He missed his appt in August.  Has not had followup labs for DM and hyperlipidemia.  Hypertension:  He is trying to cut his Lisinopril in half to take just 10 mg--pill is crumbling. Taking Amlodipine 10 mg daily   Current Meds  Medication Sig  . amLODipine (NORVASC) 10 MG tablet 1 tab by mouth daily (Patient taking differently: Take 10 mg by mouth daily. )  . blood glucose meter kit and supplies Dispense based on patient and insurance preference. Use up to four times daily as directed. (FOR ICD-9 250.00, 250.01).  . Cyanocobalamin (VITAMIN B 12 PO) Take 1 tablet by mouth daily.   . furosemide (LASIX) 40 MG tablet Take 1 tablet (40 mg total) by mouth daily.  Marland Kitchen gabapentin (NEURONTIN) 300 MG capsule Take 1 capsule (300 mg total) by mouth 3 (three) times daily. (Patient taking differently: Take 300  mg by mouth 2 (two) times daily. )  . Insulin Lispro Prot & Lispro (HUMALOG MIX 75/25 KWIKPEN) (75-25) 100 UNIT/ML Kwikpen Inject 26 units before breakfast and 24 units before dinner daily (Patient taking differently: Inject 24-26 Units into the skin See admin instructions. Inject 26 units before breakfast and 24 units before dinner daily)  . Insulin Pen Needle 31G X 5 MM MISC Use as directed twice daily  . lisinopril (PRINIVIL,ZESTRIL) 20 MG tablet Take 0.5 tablets (10 mg total) by mouth daily.  . metFORMIN (GLUCOPHAGE-XR) 500 MG 24 hr tablet 2 tabs by mouth twice daily with meals (Patient taking differently: Take 1,000 mg by mouth 2 (two) times daily. )  . rosuvastatin (CRESTOR) 10 MG tablet Take 1 tablet (10 mg total) by mouth daily.    Allergies  Allergen Reactions  . Aspirin Rash      Review of Systems     Objective:   Physical Exam  Lungs:  CTA CV:  RRR with normal S1 and S2, No S3, S4 or murmur.  Radial pulses normal and equal LE:  Severe edema of lower legs and feet bilaterally. Removed bandage overlying puncture wound mid right foot.  Mild amount of yellowish discharge.  The area is erythematous where the adhesive portion of bandage was.  Wet, whitened edge of wound  debrided with findings of 4 mm puncture opening sounded to about 1.5 cm with sterile pickups.  No drainage from the wound after removal of overlying whitened skin. Bactroban patient brings with him applied and pushed into wound. Bandage with Kerlex and overlying wrap applied to avoid more adhesive reaction in future. Instrutcted patient on how to change the bandage --only the 4 x 4 against the wound with new Bactroban twice daily.      Assessment & Plan:  1.  Right Foot Puncture wound:  Adding Augmentin 875/125 twice daily over the next 7 days  Urged patient avoid sodium and to get his legs up To take Lasix 60 mg in the morning only . Change immediate 4x4 dressing with reapplication of bactroban after washing  twice daily..   To call if worsening erythems Could not get [place] back out. Tetanus up to date CBC  2.  Hypertension: poorly controlled.   3.  CKD:  Decreasing Lisinopril to 10 mg daily.  When has follow up on Monday, will see how this affects his already high bp.  May need to add Clonidine.  4. DM:  A1C.  States he is no longer skipping meals and taking insulin regardless. Received flu shot in hospital.  5.  Hyperlipidemia:  FLP.  On Rosuvastatin

## 2018-04-17 LAB — CBC WITH DIFFERENTIAL/PLATELET
Basophils Absolute: 0.1 10*3/uL (ref 0.0–0.2)
Basos: 1 %
EOS (ABSOLUTE): 0.2 10*3/uL (ref 0.0–0.4)
EOS: 2 %
HEMATOCRIT: 33.1 % — AB (ref 37.5–51.0)
Hemoglobin: 11.3 g/dL — ABNORMAL LOW (ref 13.0–17.7)
IMMATURE GRANULOCYTES: 0 %
Immature Grans (Abs): 0 10*3/uL (ref 0.0–0.1)
LYMPHS ABS: 1.6 10*3/uL (ref 0.7–3.1)
Lymphs: 18 %
MCH: 26.9 pg (ref 26.6–33.0)
MCHC: 34.1 g/dL (ref 31.5–35.7)
MCV: 79 fL (ref 79–97)
MONOS ABS: 0.6 10*3/uL (ref 0.1–0.9)
Monocytes: 7 %
NEUTROS PCT: 72 %
Neutrophils Absolute: 6.4 10*3/uL (ref 1.4–7.0)
Platelets: 271 10*3/uL (ref 150–450)
RBC: 4.2 x10E6/uL (ref 4.14–5.80)
RDW: 13.5 % (ref 12.3–15.4)
WBC: 8.9 10*3/uL (ref 3.4–10.8)

## 2018-04-17 LAB — COMPREHENSIVE METABOLIC PANEL
ALBUMIN: 2.8 g/dL — AB (ref 3.5–5.5)
ALT: 24 IU/L (ref 0–44)
AST: 22 IU/L (ref 0–40)
Albumin/Globulin Ratio: 0.9 — ABNORMAL LOW (ref 1.2–2.2)
Alkaline Phosphatase: 117 IU/L (ref 39–117)
BUN / CREAT RATIO: 20 (ref 9–20)
BUN: 28 mg/dL — AB (ref 6–24)
CALCIUM: 8.8 mg/dL (ref 8.7–10.2)
CHLORIDE: 102 mmol/L (ref 96–106)
CO2: 25 mmol/L (ref 20–29)
Creatinine, Ser: 1.43 mg/dL — ABNORMAL HIGH (ref 0.76–1.27)
GFR, EST AFRICAN AMERICAN: 66 mL/min/{1.73_m2} (ref >59)
GFR, EST NON AFRICAN AMERICAN: 57 mL/min/{1.73_m2} — AB (ref >59)
GLUCOSE: 73 mg/dL (ref 65–99)
Globulin, Total: 3.2 g/dL (ref 1.5–4.5)
Potassium: 4.5 mmol/L (ref 3.5–5.2)
Sodium: 141 mmol/L (ref 134–144)
TOTAL PROTEIN: 6 g/dL (ref 6.0–8.5)

## 2018-04-17 LAB — LIPID PANEL W/O CHOL/HDL RATIO
Cholesterol, Total: 218 mg/dL — ABNORMAL HIGH (ref 100–199)
HDL: 36 mg/dL — AB (ref >39)
LDL Calculated: 142 mg/dL — ABNORMAL HIGH (ref 0–99)
Triglycerides: 200 mg/dL — ABNORMAL HIGH (ref 0–149)
VLDL CHOLESTEROL CAL: 40 mg/dL (ref 5–40)

## 2018-04-17 LAB — HGB A1C W/O EAG: Hgb A1c MFr Bld: 7.1 % — ABNORMAL HIGH (ref 4.8–5.6)

## 2018-04-19 ENCOUNTER — Encounter: Payer: Self-pay | Admitting: Internal Medicine

## 2018-04-19 ENCOUNTER — Ambulatory Visit: Payer: Self-pay | Admitting: Internal Medicine

## 2018-04-19 VITALS — BP 190/90 | HR 76 | Resp 12 | Ht 66.25 in | Wt 179.0 lb

## 2018-04-19 DIAGNOSIS — E782 Mixed hyperlipidemia: Secondary | ICD-10-CM

## 2018-04-19 DIAGNOSIS — N182 Chronic kidney disease, stage 2 (mild): Secondary | ICD-10-CM

## 2018-04-19 DIAGNOSIS — S91331S Puncture wound without foreign body, right foot, sequela: Secondary | ICD-10-CM

## 2018-04-19 DIAGNOSIS — I10 Essential (primary) hypertension: Secondary | ICD-10-CM

## 2018-04-19 DIAGNOSIS — R609 Edema, unspecified: Secondary | ICD-10-CM

## 2018-04-19 DIAGNOSIS — Z794 Long term (current) use of insulin: Secondary | ICD-10-CM

## 2018-04-19 DIAGNOSIS — N179 Acute kidney failure, unspecified: Secondary | ICD-10-CM

## 2018-04-19 DIAGNOSIS — E1165 Type 2 diabetes mellitus with hyperglycemia: Secondary | ICD-10-CM

## 2018-04-19 LAB — GLUCOSE, POCT (MANUAL RESULT ENTRY): POC GLUCOSE: 287 mg/dL — AB (ref 70–99)

## 2018-04-19 MED ORDER — METOPROLOL TARTRATE 25 MG PO TABS: 25.0000 mg | ORAL_TABLET | Freq: Two times a day (BID) | ORAL | 11 refills | Status: AC

## 2018-04-19 NOTE — Progress Notes (Signed)
Subjective:    Patient ID: Willie Jordan, male    DOB: Jun 13, 1966, 52 y.o.   MRN: [id number]  HPI   Foot is feeling better.  No real discharge.  No fever.  He is keeping his feet up. Swelling of feet , especially the right foot is much better between elevation and increased furosemide dose to 60 mg in the morning.   He is urinating a lot more. Taking Augmentin.  Hyperlipidemia:  Better with Rosuvastatin, but still not at goal with any of the levels.  Lipid Panel     Component Value Date/Time   CHOL 218 (H) 04/16/2018 1114   TRIG 200 (H) 04/16/2018 1114   HDL 36 (L) 04/16/2018 1114   CHOLHDL 7.6 09/27/2016 1643   VLDL 36 09/27/2016 1643   LDLCALC 142 (H) 04/16/2018 1114   LDLDIRECT  05/25/2007 1135    64 (NOTE) ATP III Classification (LDL):      < 100        mg/dL         Optimal     100 - 129     mg/dL         Near or Above Optimal     130 - 159     mg/dL         Borderline High     160 - 189     mg/dL         High      > 190        mg/dL         Very  High     Hypertension:  Taking 10 mg of Lisinopril, half of previous dose.  60 mg of Furosemide and 10 mg of Amlodipine.  Has not missed any medication. Renal insufficiency with ACE I was worsening.  DM;  A1C back down to 7.1%, but still not at goal.  Discussed continuing to work on lifestyle changes. Has eye check this Wednesday. Flu shot in hospital last week.  Anemia: much improved from hospital.  Hemoglobin now about 11.0  Current Meds  Medication Sig  . amLODipine (NORVASC) 10 MG tablet 1 tab by mouth daily  . amoxicillin-clavulanate (AUGMENTIN) 875-125 MG tablet Take 1 tablet by mouth 2 (two) times daily.  . blood glucose meter kit and supplies Dispense based on patient and insurance preference. Use up to four times daily as directed. (FOR ICD-9 250.00, 250.01).  . Cyanocobalamin (VITAMIN B 12 PO) Take 1 tablet by mouth daily.   . furosemide (LASIX) 40 MG tablet 1 1/2 tabs by mouth in morning only daily  .  gabapentin (NEURONTIN) 300 MG capsule Take 1 capsule (300 mg total) by mouth 3 (three) times daily.  . Insulin Lispro Prot & Lispro (HUMALOG MIX 75/25 KWIKPEN) (75-25) 100 UNIT/ML Kwikpen Inject 26 units before breakfast and 24 units before dinner daily  . Insulin Pen Needle 31G X 5 MM MISC Use as directed twice daily  . lisinopril (PRINIVIL,ZESTRIL) 20 MG tablet Take 0.5 tablets (10 mg total) by mouth daily.  . metFORMIN (GLUCOPHAGE-XR) 500 MG 24 hr tablet 2 tabs by mouth twice daily with meals  . rosuvastatin (CRESTOR) 10 MG tablet Take 1 tablet (10 mg total) by mouth daily.    Allergies  Allergen Reactions  . Aspirin Rash         Review of Systems     Objective:   Physical Exam  Has lost 6 lbs from Friday. Looks much better--puffier before.  Lungs:  CTA CV:  RRR without murmur or rub.  Right foot with much less edema.  Erythema still in bandage shape around puncture wound.  Scant amount of yellow pustular discharge from opening of wound. Cleaned wound with Hibiclens and no further dishcharge elicited. Bactroban applied and reapplied dressing without adhesive.       Assessment & Plan:  1.  Right foot puncture wound with mild secondary infection made worse by extensive edema. Tolerating furosemide 60 mg daily in morning with improvement of edema. Continues on Augmentin Continue to clean wound twice daily and re wrap. Continue to elevate legs. See back in 3 days. BMP today.  2.  DM: improved glucose control, though not quite at goal below 7.0%.  To continue to work on lifestyle changes.  3.  Hyperlipidemia:  Increase Rosuvastatin to 20 mg daily.  FLP, hepatic profile in 6 weeks.  4.  Hypertension:  Add Metoprolol 25 mg twice daily.

## 2018-04-19 NOTE — Patient Instructions (Signed)
A1C:  7.1%  Improved, but not at goal

## 2018-04-20 LAB — BASIC METABOLIC PANEL
BUN / CREAT RATIO: 21 — AB (ref 9–20)
BUN: 34 mg/dL — AB (ref 6–24)
CALCIUM: 8.7 mg/dL (ref 8.7–10.2)
CO2: 21 mmol/L (ref 20–29)
CREATININE: 1.59 mg/dL — AB (ref 0.76–1.27)
Chloride: 101 mmol/L (ref 96–106)
GFR calc Af Amer: 58 mL/min/{1.73_m2} — ABNORMAL LOW (ref >59)
GFR, EST NON AFRICAN AMERICAN: 50 mL/min/{1.73_m2} — AB (ref >59)
Glucose: 301 mg/dL — ABNORMAL HIGH (ref 65–99)
Potassium: 4.3 mmol/L (ref 3.5–5.2)
Sodium: 137 mmol/L (ref 134–144)

## 2018-04-22 ENCOUNTER — Ambulatory Visit: Payer: Self-pay | Admitting: Internal Medicine

## 2018-04-22 ENCOUNTER — Encounter: Payer: Self-pay | Admitting: Internal Medicine

## 2018-04-22 VITALS — BP 180/82 | HR 84 | Resp 12 | Ht 66.25 in | Wt 182.0 lb

## 2018-04-22 DIAGNOSIS — S91331S Puncture wound without foreign body, right foot, sequela: Secondary | ICD-10-CM

## 2018-04-22 DIAGNOSIS — R609 Edema, unspecified: Secondary | ICD-10-CM

## 2018-04-22 DIAGNOSIS — I10 Essential (primary) hypertension: Secondary | ICD-10-CM

## 2018-04-22 MED ORDER — ROSUVASTATIN CALCIUM 20 MG PO TABS: ORAL_TABLET | ORAL | 11 refills | Status: DC

## 2018-04-22 NOTE — Addendum Note (Signed)
Addended by: Marcelino Duster on: 04/22/2018 03:43 PM   Modules accepted: Orders

## 2018-04-22 NOTE — Progress Notes (Addendum)
   Subjective:    Patient ID: Willie Jordan, male    DOB: 07/24/1965, 52 y.o.   MRN: [id number]  HPI   Right foot with puncture wound:  He feels it is improving.  Just took a shower, so washed the foot.  Did not take Augmentin this morning as he feels it gives him nausea.  He for some reason went back to taking 40 mg of Furosemide twice daily. We discussed at length on the 11th to take 60 mg only in the morning   Hypertension:  States he has been taking Metoprolol since Monday after seen.  Current Meds  Medication Sig  . amLODipine (NORVASC) 10 MG tablet 1 tab by mouth daily  . blood glucose meter kit and supplies Dispense based on patient and insurance preference. Use up to four times daily as directed. (FOR ICD-9 250.00, 250.01).  . Cyanocobalamin (VITAMIN B 12 PO) Take 1 tablet by mouth daily.   . furosemide (LASIX) 40 MG tablet 1 1/2 tabs by mouth in morning only daily  . gabapentin (NEURONTIN) 300 MG capsule Take 1 capsule (300 mg total) by mouth 3 (three) times daily.  . Insulin Lispro Prot & Lispro (HUMALOG MIX 75/25 KWIKPEN) (75-25) 100 UNIT/ML Kwikpen Inject 26 units before breakfast and 24 units before dinner daily  . Insulin Pen Needle 31G X 5 MM MISC Use as directed twice daily  . metFORMIN (GLUCOPHAGE-XR) 500 MG 24 hr tablet 2 tabs by mouth twice daily with meals  . metoprolol tartrate (LOPRESSOR) 25 MG tablet Take 1 tablet (25 mg total) by mouth 2 (two) times daily.  . rosuvastatin (CRESTOR) 10 MG tablet Take 1 tablet (10 mg total) by mouth daily.    Allergies  Allergen Reactions  . Aspirin Rash    Review of Systems     Objective:   Physical Exam  Has regained 3 lbs. Face puffy again Right foot continues to decrease in size with diuresis and elevation of feet. Erythema of right foot continues to decrease. Mid plantar puncture wound appears to be healing nicely with no discharge.    Assessment & Plan:  1.  Puncture wound, right foot.  Appears to be healing.  Asked him to continue current treatment.  Would like to see him back again next Monday before allowing back to work.  To finish Augmentin.  2.  Edema:  Asked him to go back to Furosemide 60 mg in morning only as he seems to get better diuresis with a single higher dose.  3.  Hypertension:  Unlikely he is taking Metoprolol with elevated HR from usual.  BP without change.  Encouraged pill box usage. Still taking Lisinopril, despite confirming he understood not to take when we called him after labs  4.  Hyperlipidemia:  Did not increase Rosuvastatin to 20 mg daily.  Went over this again and wrote all over his discharge papers and bottles the changes he was to make.

## 2018-04-23 ENCOUNTER — Encounter (HOSPITAL_BASED_OUTPATIENT_CLINIC_OR_DEPARTMENT_OTHER): Payer: No Typology Code available for payment source | Attending: Internal Medicine

## 2018-04-23 DIAGNOSIS — E11621 Type 2 diabetes mellitus with foot ulcer: Secondary | ICD-10-CM | POA: Insufficient documentation

## 2018-04-23 DIAGNOSIS — I509 Heart failure, unspecified: Secondary | ICD-10-CM | POA: Insufficient documentation

## 2018-04-23 DIAGNOSIS — L97513 Non-pressure chronic ulcer of other part of right foot with necrosis of muscle: Secondary | ICD-10-CM | POA: Insufficient documentation

## 2018-04-23 DIAGNOSIS — I110 Hypertensive heart disease with heart failure: Secondary | ICD-10-CM | POA: Insufficient documentation

## 2018-04-23 DIAGNOSIS — E1142 Type 2 diabetes mellitus with diabetic polyneuropathy: Secondary | ICD-10-CM | POA: Insufficient documentation

## 2018-04-26 ENCOUNTER — Encounter: Payer: Self-pay | Admitting: Internal Medicine

## 2018-04-26 ENCOUNTER — Ambulatory Visit: Payer: Self-pay | Admitting: Internal Medicine

## 2018-04-26 VITALS — BP 142/82 | HR 68 | Resp 12 | Ht 66.25 in | Wt 181.0 lb

## 2018-04-26 DIAGNOSIS — S91331S Puncture wound without foreign body, right foot, sequela: Secondary | ICD-10-CM

## 2018-04-26 DIAGNOSIS — E1165 Type 2 diabetes mellitus with hyperglycemia: Secondary | ICD-10-CM

## 2018-04-26 DIAGNOSIS — I10 Essential (primary) hypertension: Secondary | ICD-10-CM

## 2018-04-26 DIAGNOSIS — Z794 Long term (current) use of insulin: Secondary | ICD-10-CM

## 2018-04-26 LAB — GLUCOSE, POCT (MANUAL RESULT ENTRY): POC Glucose: 99 mg/dl (ref 70–99)

## 2018-04-26 NOTE — Progress Notes (Signed)
   Subjective:    Patient ID: Willie Jordan, male    DOB: [birth year], 52 y.o.   MRN: [id number]  HPI   1.  Frustrated with his visits with me.  States he feels like he is doing all he can do to control his health issues and I don't understand.   He states he threw away all the instruction I wrote out for him for one of his latter visits.   Discussed again simple food preparation he can take in a cooler with him.  To try and prepare for the week so he is not skipping meals and then sometimes medications and not purchasing fast food or less healthy food. States he does not have a wife to do all this food preparation for him. Discussed my concern that I have not been able to motivate him to care for himself in this way.   Discussed perhaps he needs to find a provider who can motivate him to care for himself.   Current Meds  Medication Sig  . amLODipine (NORVASC) 10 MG tablet 1 tab by mouth daily  . blood glucose meter kit and supplies Dispense based on patient and insurance preference. Use up to four times daily as directed. (FOR ICD-9 250.00, 250.01).  . Cyanocobalamin (VITAMIN B 12 PO) Take 1 tablet by mouth daily.   . furosemide (LASIX) 40 MG tablet 1 1/2 tabs by mouth in morning only daily  . gabapentin (NEURONTIN) 300 MG capsule Take 1 capsule (300 mg total) by mouth 3 (three) times daily.  . Insulin Lispro Prot & Lispro (HUMALOG MIX 75/25 KWIKPEN) (75-25) 100 UNIT/ML Kwikpen Inject 26 units before breakfast and 24 units before dinner daily  . Insulin Pen Needle 31G X 5 MM MISC Use as directed twice daily  . metFORMIN (GLUCOPHAGE-XR) 500 MG 24 hr tablet 2 tabs by mouth twice daily with meals  . metoprolol tartrate (LOPRESSOR) 25 MG tablet Take 1 tablet (25 mg total) by mouth 2 (two) times daily.  . rosuvastatin (CRESTOR) 20 MG tablet 1 tab by mouth daily with dinner    Allergies  Allergen Reactions  . Aspirin Rash      Review of Systems     Objective:   Physical Exam  Left  before could examine foot.      Assessment & Plan:  DM Hypertension Penetrating right foot wound Not clear he will continue to follow here.   Unable to motivate patient to take better care of himself. While he certainly has some barriers to care, seems disinterested in attempting minor changes.

## 2018-04-29 ENCOUNTER — Other Ambulatory Visit (HOSPITAL_COMMUNITY)
Admission: RE | Admit: 2018-04-29 | Discharge: 2018-04-29 | Disposition: A | Discharge status: Home / Self Care | Payer: No Typology Code available for payment source | Source: Other Acute Inpatient Hospital | Attending: Internal Medicine | Admitting: Internal Medicine

## 2018-04-29 DIAGNOSIS — E11621 Type 2 diabetes mellitus with foot ulcer: Secondary | ICD-10-CM | POA: Insufficient documentation

## 2018-04-30 IMAGING — DX DG ABD PORTABLE 1V
1 series · 1 of 1 positions shown · non-contrast
Comparison: Chest radiograph 07/19/2017

CLINICAL DATA: Ileus.  History of diabetes.

EXAM:
PORTABLE ABDOMEN - 1 VIEW

[abdomen]
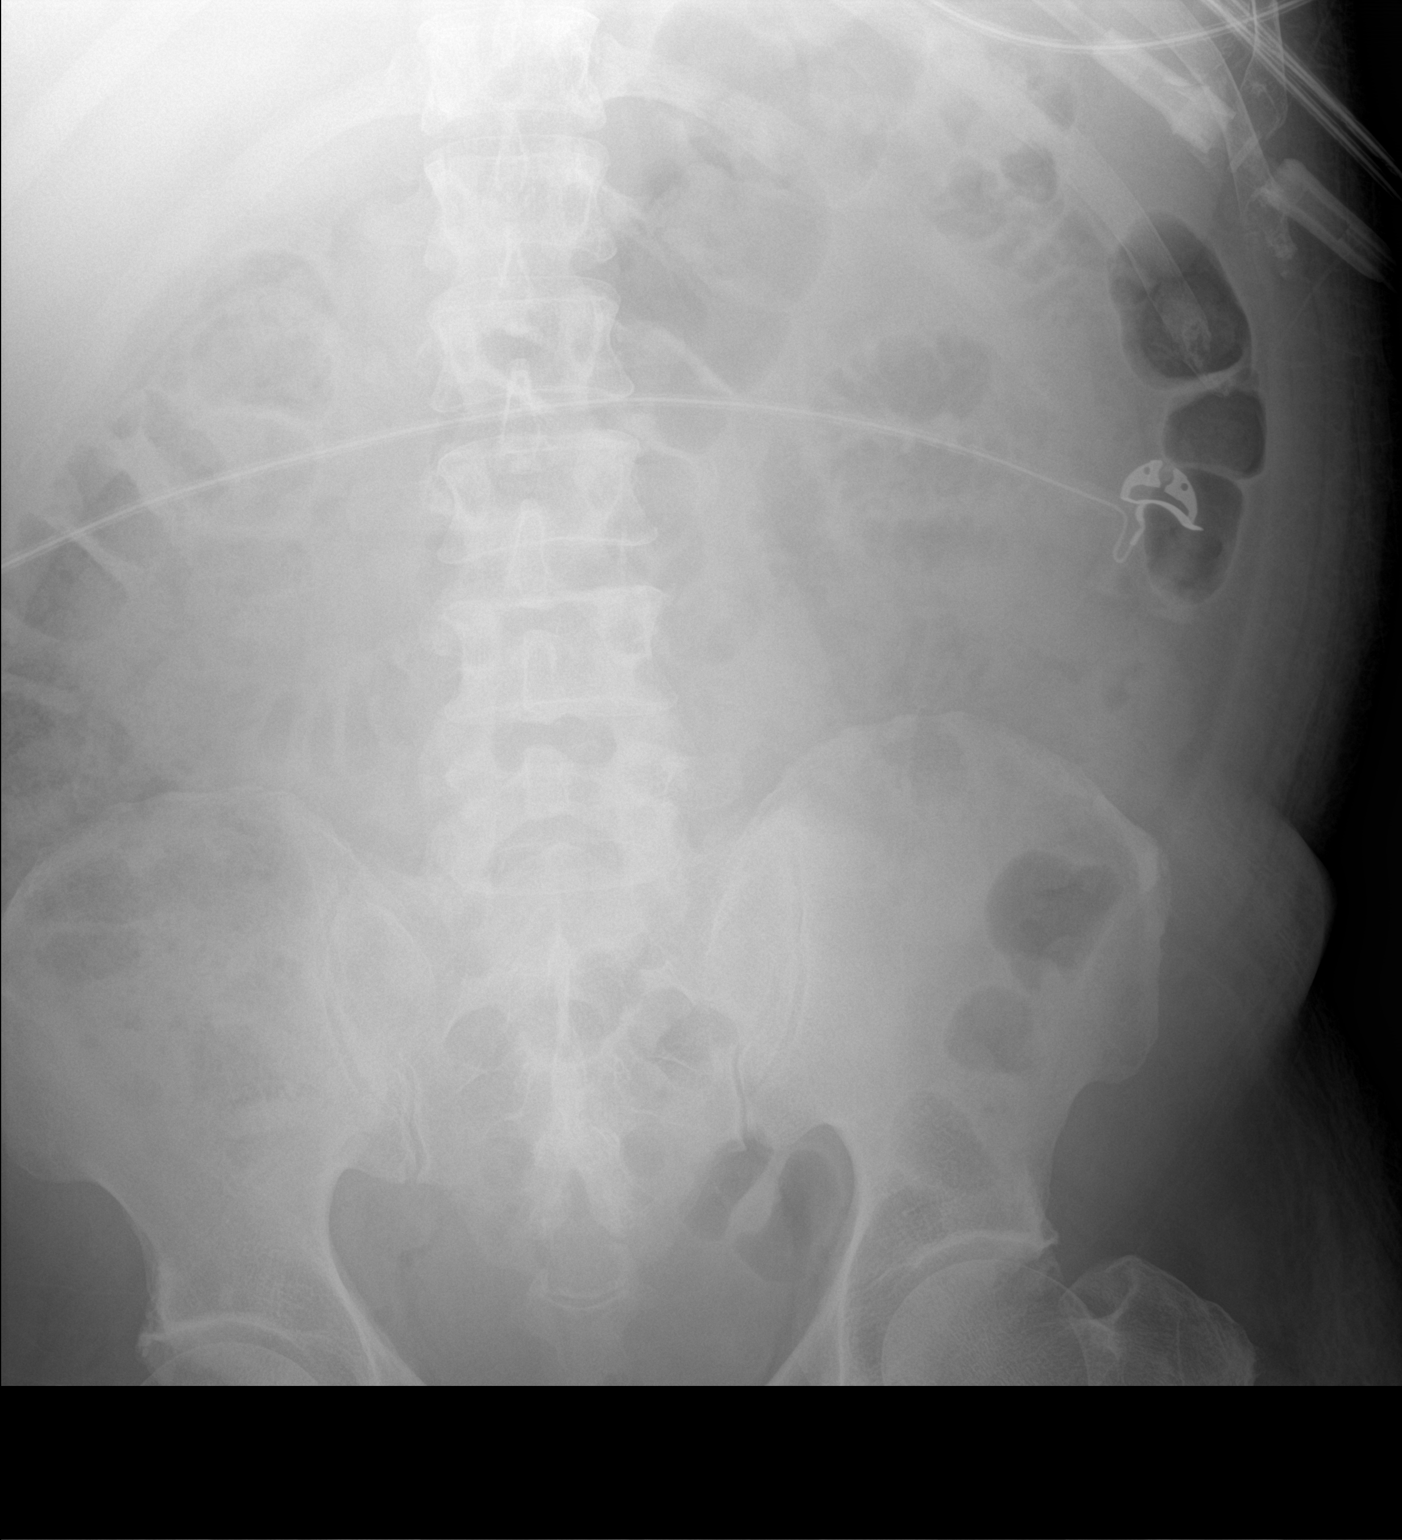

[1 of 1 positions shown; findings below may reference images not displayed]

FINDINGS: Gas is demonstrated within nondilated loops of large and small
bowel. Supine evaluation limited for the detection of free
intraperitoneal air. Unremarkable osseous skeleton.
IMPRESSION: Nonobstructed bowel gas pattern.

## 2018-05-02 LAB — AEROBIC CULTURE  (SUPERFICIAL SPECIMEN)

## 2018-05-02 LAB — AEROBIC CULTURE W GRAM STAIN (SUPERFICIAL SPECIMEN)

## 2018-05-03 IMAGING — CR DG CHEST 2V
2 series · 2 of 2 positions shown · non-contrast
Comparison: Chest x-ray of July 19, 2017

CLINICAL DATA: Pneumonia, former smoker. History of diabetes and
hypertension.

EXAM:
CHEST  2 VIEW

[chest pa]
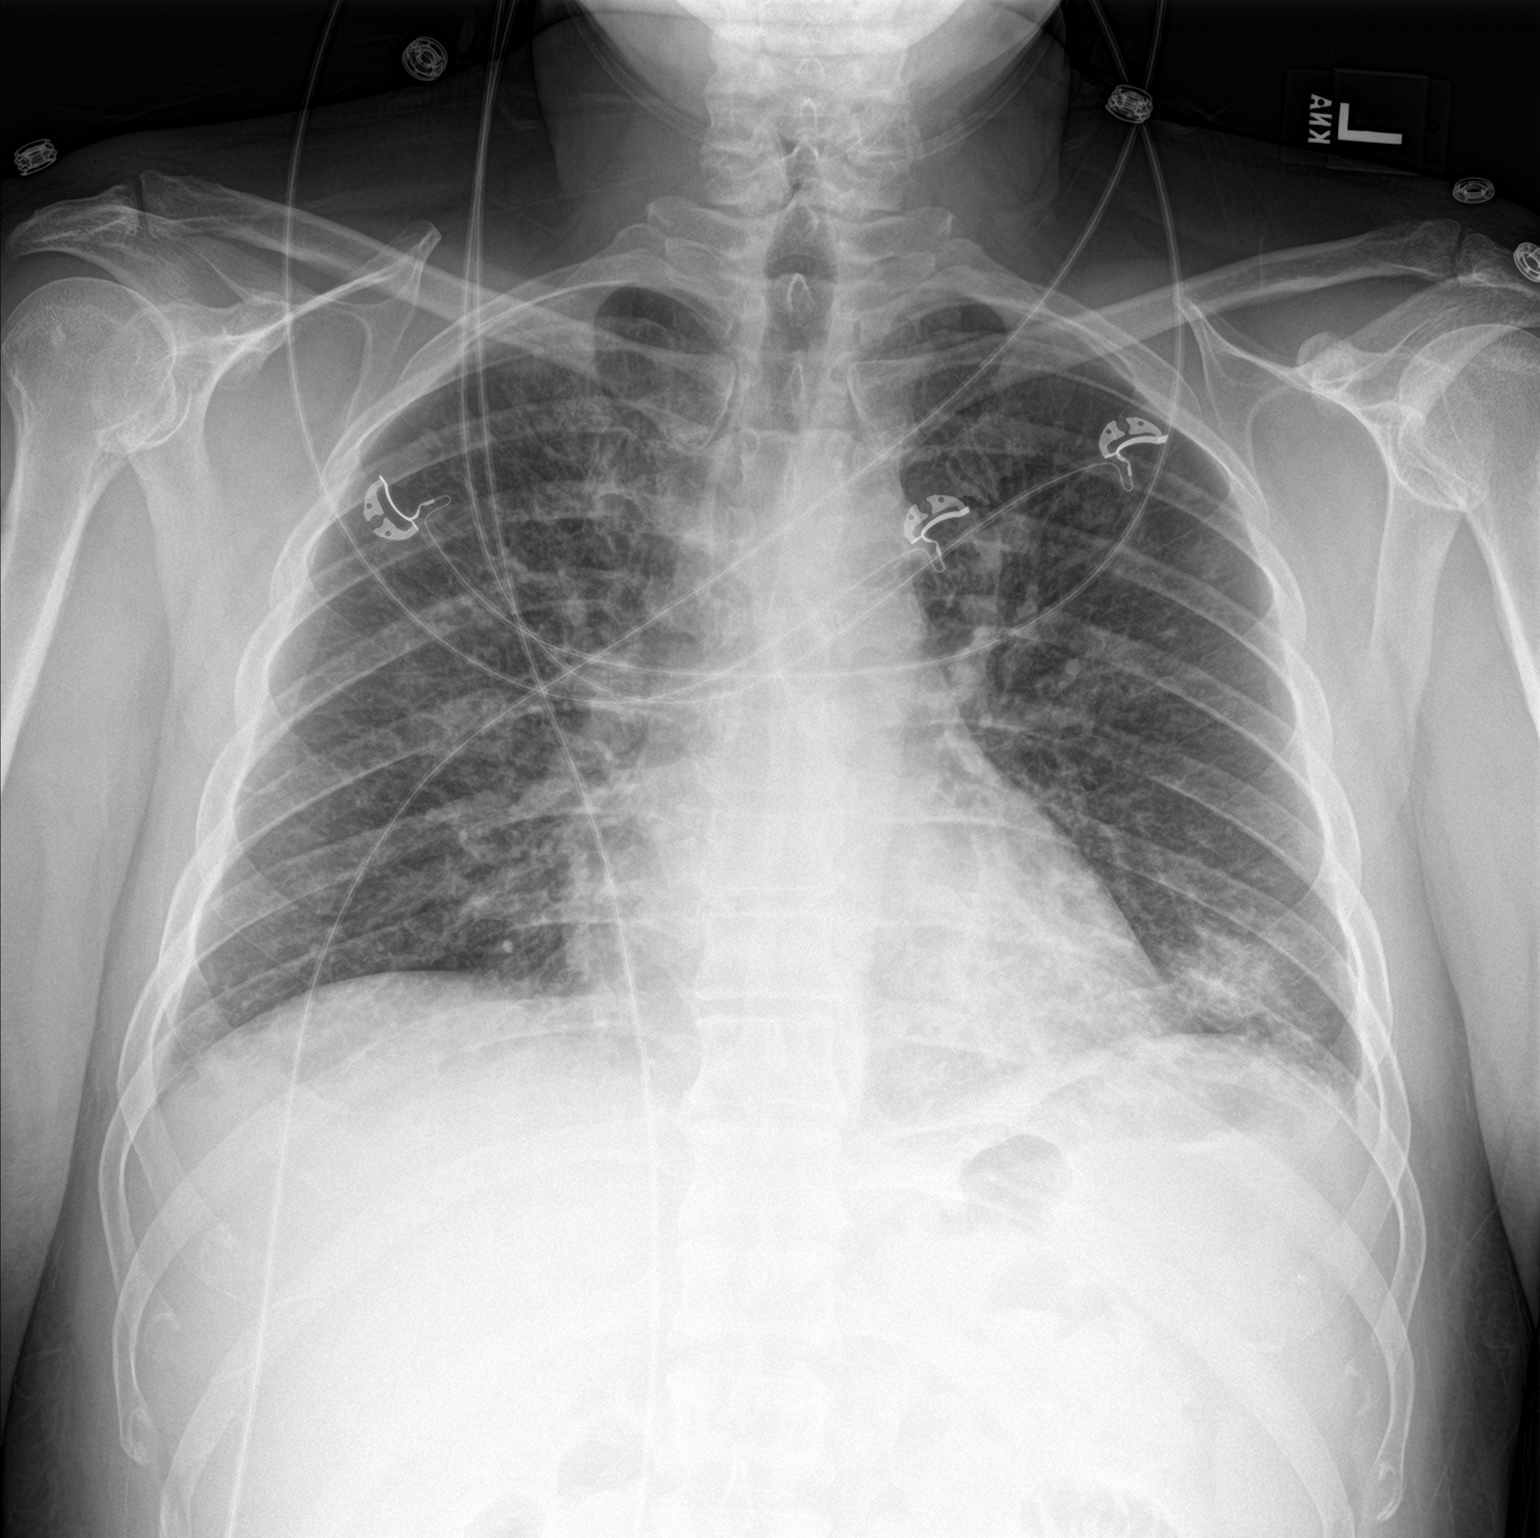

[chest lat]
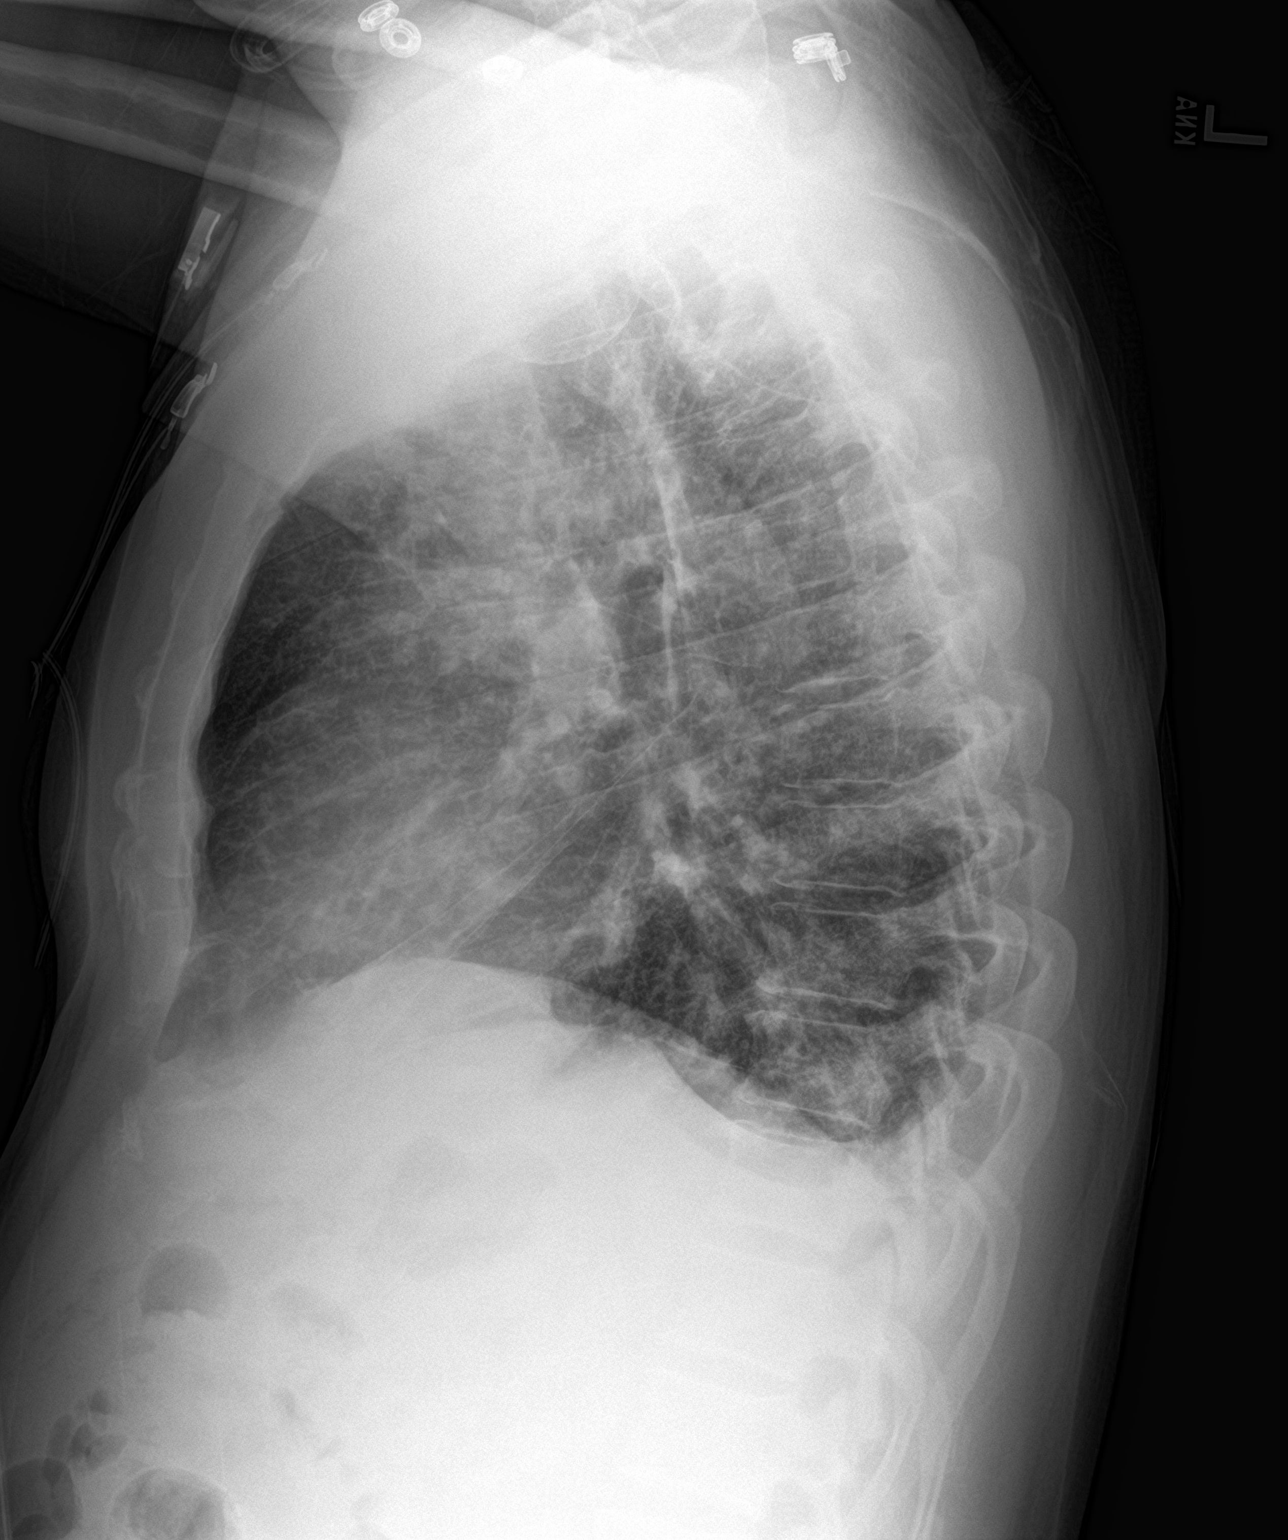

[2 of 2 positions shown; findings below may reference images not displayed]

FINDINGS: The lungs are adequately inflated. The interstitial markings remain
increased. There is a confluent airspace opacity present lateral to
the cardiac apex more conspicuous than on the previous study.
Alveolar opacities in the left upper lobe and right mid and upper
lung are slightly less conspicuous. The heart and pulmonary
vascularity are normal. The bony thorax is unremarkable.
IMPRESSION: Left lower lobe pneumonia more conspicuous today. Decreased
conspicuity of airspace opacities in the upper lobe. Trace bilateral
pleural effusions.

## 2018-05-06 IMAGING — DX DG CHEST 2V
2 series · 2 of 2 positions shown · non-contrast
Comparison: July 23, 2017

CLINICAL DATA: Pneumonia

EXAM:
CHEST  2 VIEW

[chest pa]
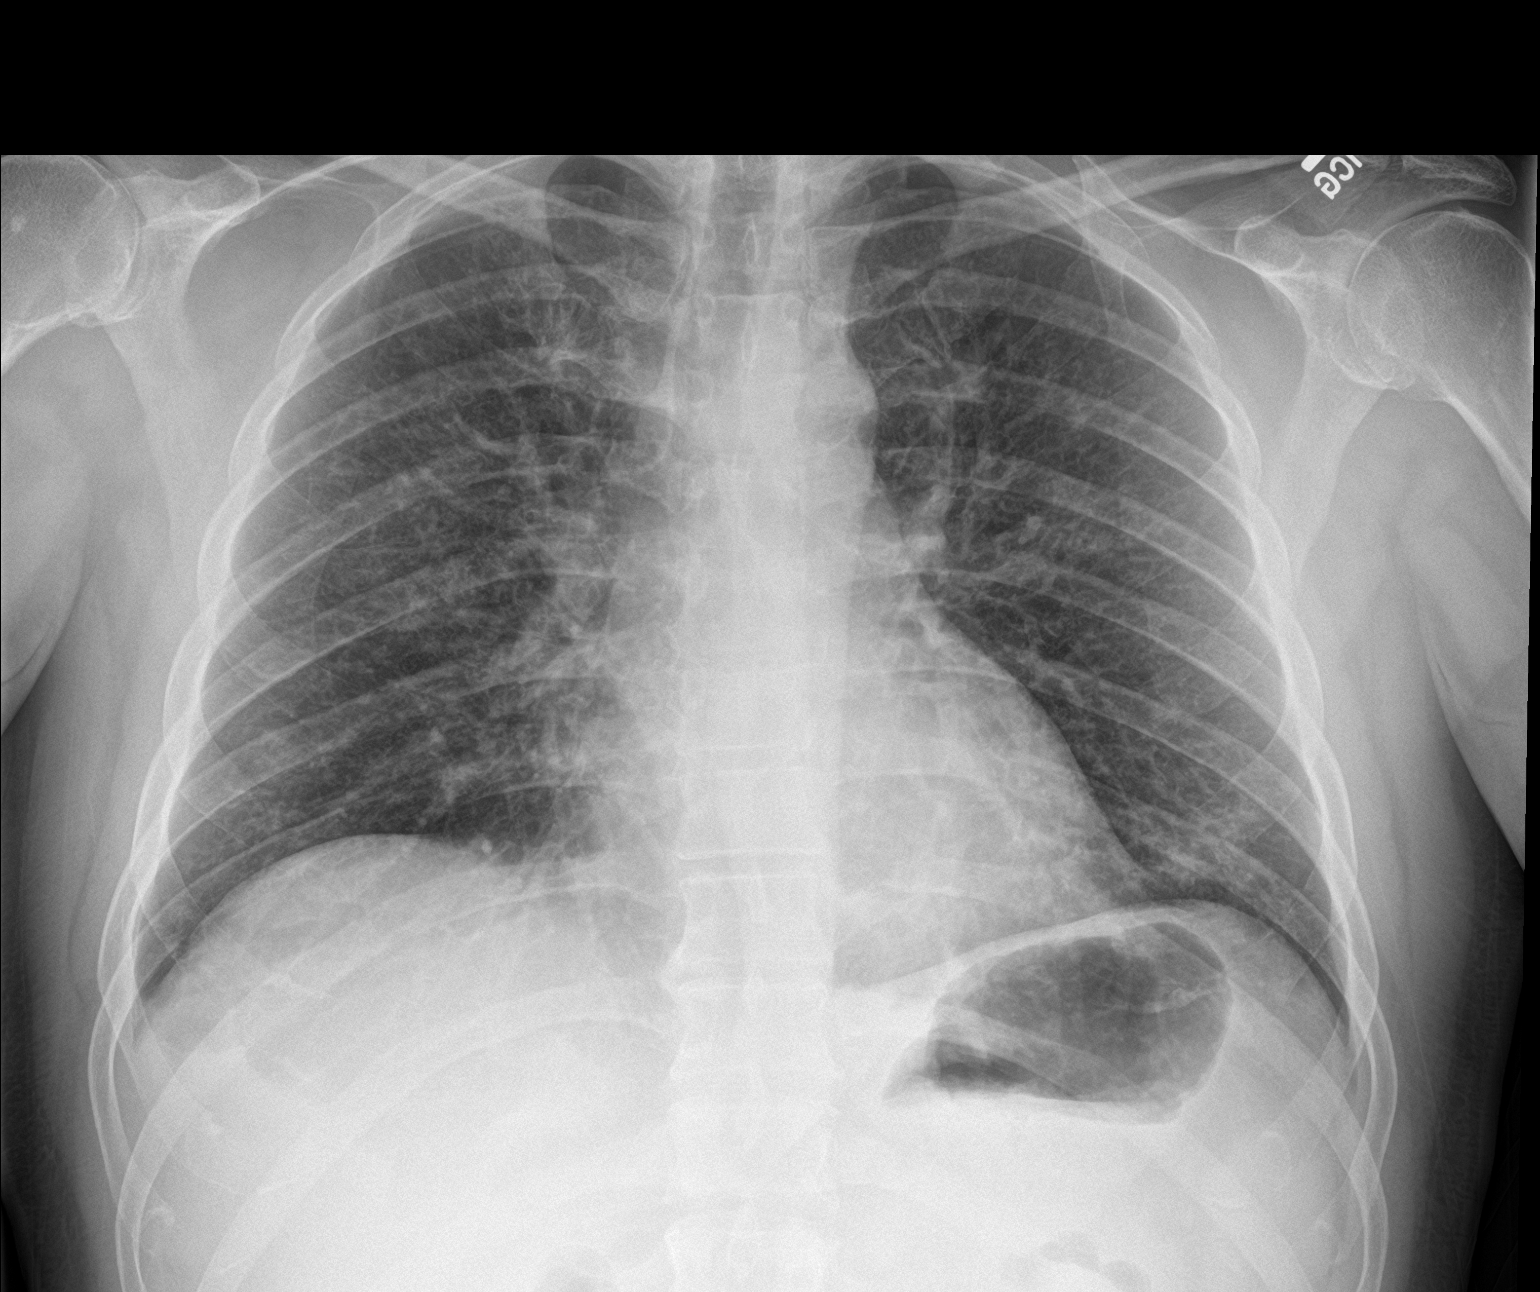

[chest lat]
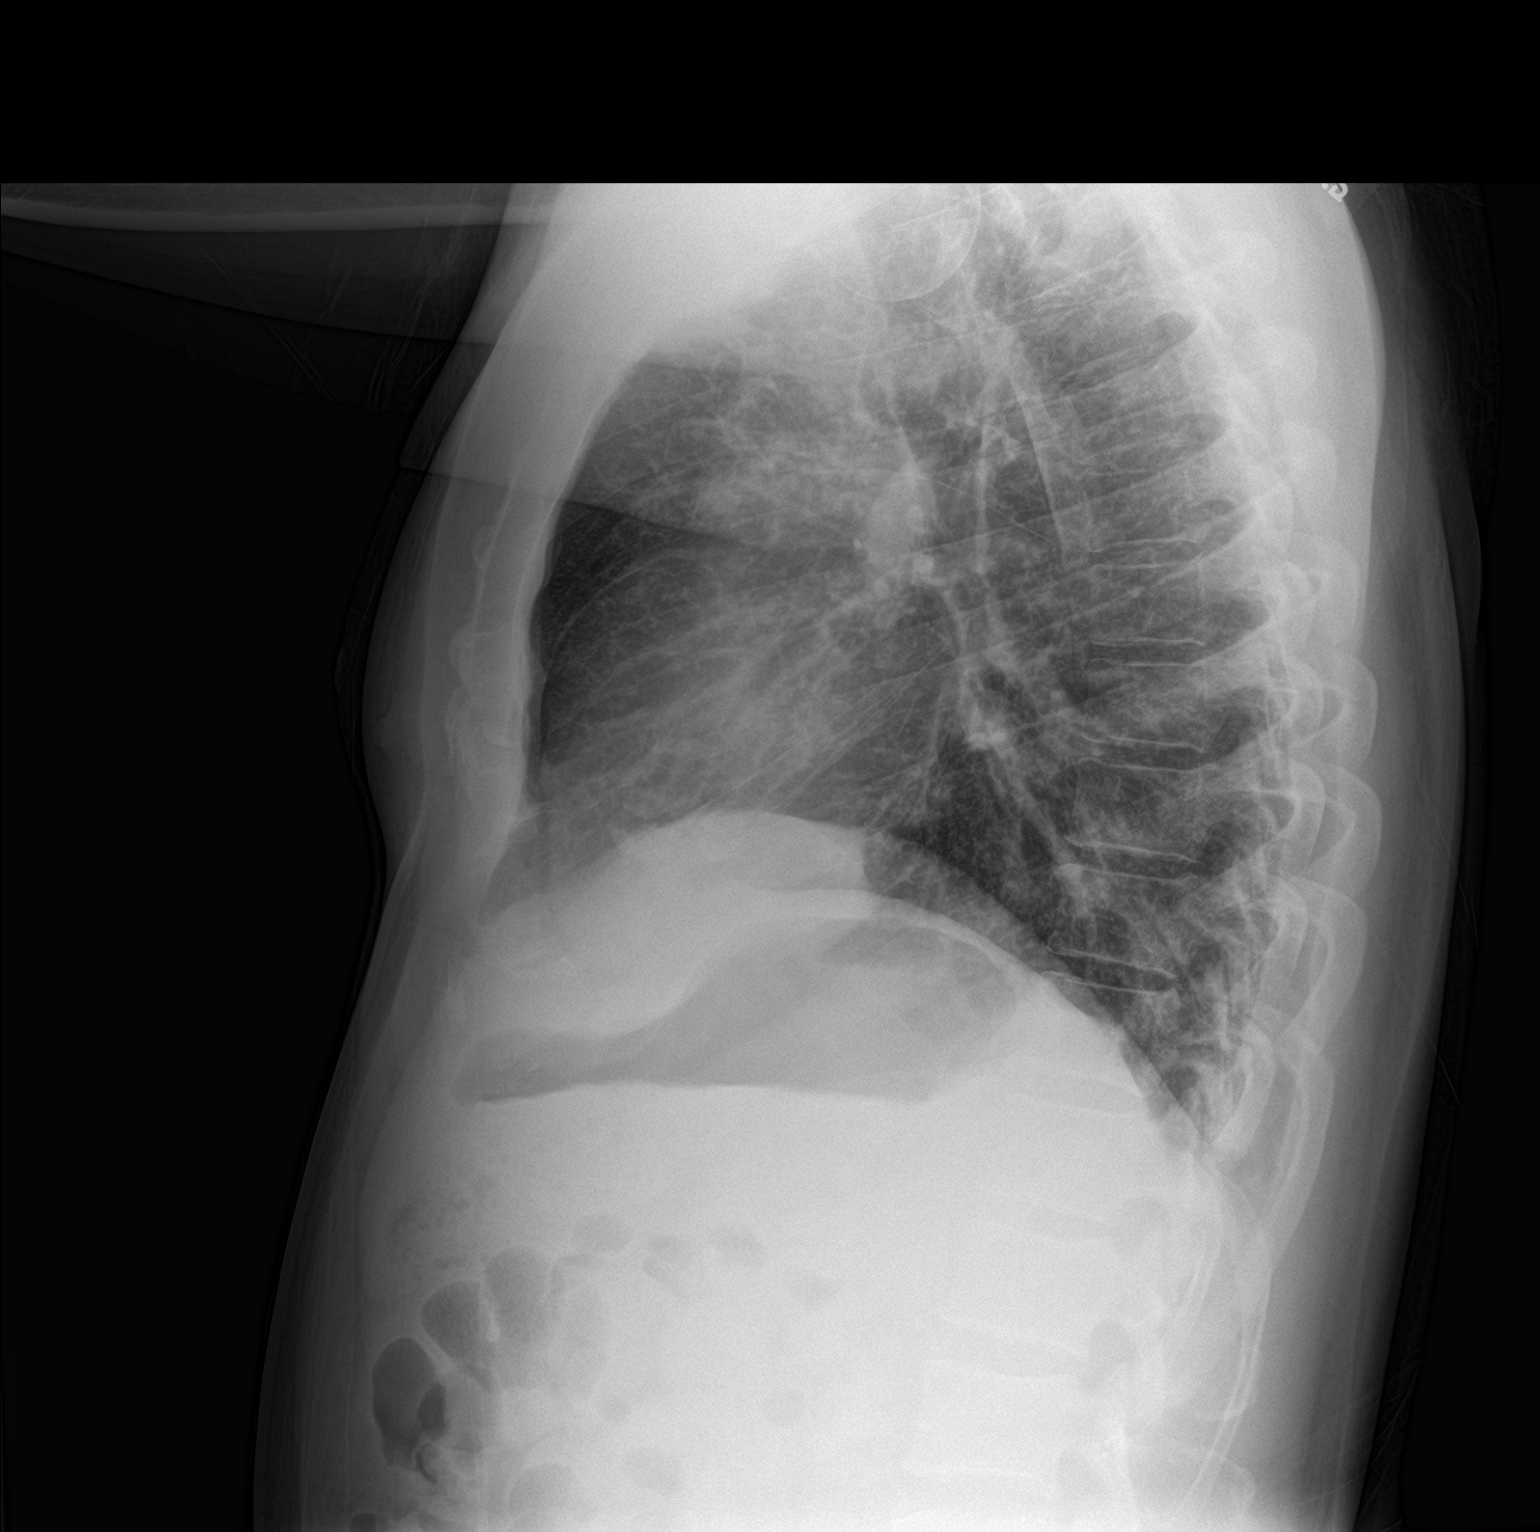

[2 of 2 positions shown; findings below may reference images not displayed]

FINDINGS: There has been partial but incomplete clearing of infiltrate from
the left base. Patchy opacity remains in this area. Lungs elsewhere
are clear. Heart size and pulmonary vascularity are normal. No
adenopathy. No bone lesions.
IMPRESSION: Partial but incomplete clearing of left lower lobe pneumonia. No new
opacity. Stable cardiac silhouette.

## 2018-05-07 ENCOUNTER — Encounter (HOSPITAL_BASED_OUTPATIENT_CLINIC_OR_DEPARTMENT_OTHER): Payer: No Typology Code available for payment source | Attending: Internal Medicine

## 2018-05-07 DIAGNOSIS — I10 Essential (primary) hypertension: Secondary | ICD-10-CM | POA: Insufficient documentation

## 2018-05-07 DIAGNOSIS — Z89429 Acquired absence of other toe(s), unspecified side: Secondary | ICD-10-CM | POA: Insufficient documentation

## 2018-05-07 DIAGNOSIS — L97412 Non-pressure chronic ulcer of right heel and midfoot with fat layer exposed: Secondary | ICD-10-CM | POA: Insufficient documentation

## 2018-05-07 DIAGNOSIS — L03115 Cellulitis of right lower limb: Secondary | ICD-10-CM | POA: Insufficient documentation

## 2018-05-07 DIAGNOSIS — E1142 Type 2 diabetes mellitus with diabetic polyneuropathy: Secondary | ICD-10-CM | POA: Insufficient documentation

## 2018-05-07 DIAGNOSIS — Z87891 Personal history of nicotine dependence: Secondary | ICD-10-CM | POA: Insufficient documentation

## 2018-05-07 DIAGNOSIS — E11621 Type 2 diabetes mellitus with foot ulcer: Secondary | ICD-10-CM | POA: Insufficient documentation

## 2018-05-12 ENCOUNTER — Other Ambulatory Visit (HOSPITAL_BASED_OUTPATIENT_CLINIC_OR_DEPARTMENT_OTHER): Payer: Self-pay | Admitting: Internal Medicine

## 2018-05-12 DIAGNOSIS — E13621 Other specified diabetes mellitus with foot ulcer: Secondary | ICD-10-CM

## 2018-05-12 DIAGNOSIS — L97519 Non-pressure chronic ulcer of other part of right foot with unspecified severity: Principal | ICD-10-CM

## 2018-05-13 ENCOUNTER — Ambulatory Visit: Payer: Self-pay | Admitting: Internal Medicine

## 2018-05-21 ENCOUNTER — Ambulatory Visit (HOSPITAL_COMMUNITY)
Admission: RE | Admit: 2018-05-21 | Discharge: 2018-05-21 | Disposition: A | Discharge status: Home / Self Care | Payer: Self-pay | Source: Ambulatory Visit | Attending: Internal Medicine | Admitting: Internal Medicine

## 2018-05-21 DIAGNOSIS — E13621 Other specified diabetes mellitus with foot ulcer: Secondary | ICD-10-CM | POA: Insufficient documentation

## 2018-05-21 DIAGNOSIS — L03115 Cellulitis of right lower limb: Secondary | ICD-10-CM | POA: Insufficient documentation

## 2018-05-21 DIAGNOSIS — L97519 Non-pressure chronic ulcer of other part of right foot with unspecified severity: Secondary | ICD-10-CM | POA: Insufficient documentation

## 2018-05-21 MED ORDER — GADOBUTROL 1 MMOL/ML IV SOLN
8.0000 mL | Freq: Once | INTRAVENOUS | Status: AC | PRN
  Administered 2018-05-21: 8 mL via INTRAVENOUS

## 2018-06-09 ENCOUNTER — Encounter (HOSPITAL_BASED_OUTPATIENT_CLINIC_OR_DEPARTMENT_OTHER): Payer: No Typology Code available for payment source | Attending: Physician Assistant

## 2018-06-09 DIAGNOSIS — Z8631 Personal history of diabetic foot ulcer: Secondary | ICD-10-CM | POA: Insufficient documentation

## 2018-06-09 DIAGNOSIS — Z09 Encounter for follow-up examination after completed treatment for conditions other than malignant neoplasm: Secondary | ICD-10-CM | POA: Insufficient documentation

## 2018-06-09 DIAGNOSIS — E1142 Type 2 diabetes mellitus with diabetic polyneuropathy: Secondary | ICD-10-CM | POA: Insufficient documentation

## 2018-06-09 DIAGNOSIS — Z87891 Personal history of nicotine dependence: Secondary | ICD-10-CM | POA: Insufficient documentation

## 2018-07-04 ENCOUNTER — Encounter: Payer: Self-pay | Admitting: Internal Medicine

## 2018-08-09 ENCOUNTER — Other Ambulatory Visit: Payer: Self-pay | Admitting: Internal Medicine

## 2018-08-26 ENCOUNTER — Other Ambulatory Visit: Payer: Self-pay | Admitting: Internal Medicine

## 2018-08-26 DIAGNOSIS — N183 Chronic kidney disease, stage 3 unspecified: Secondary | ICD-10-CM

## 2018-09-01 ENCOUNTER — Other Ambulatory Visit: Payer: Self-pay | Admitting: Internal Medicine

## 2018-09-01 ENCOUNTER — Ambulatory Visit
Admission: RE | Admit: 2018-09-01 | Discharge: 2018-09-01 | Disposition: A | Discharge status: Home / Self Care | Payer: No Typology Code available for payment source | Source: Ambulatory Visit | Attending: Internal Medicine | Admitting: Internal Medicine

## 2018-09-01 DIAGNOSIS — N183 Chronic kidney disease, stage 3 unspecified: Secondary | ICD-10-CM

## 2018-09-02 ENCOUNTER — Inpatient Hospital Stay (HOSPITAL_COMMUNITY)
Admission: EM | Admit: 2018-09-02 | Discharge: 2018-09-10 | DRG: 183 | Disposition: A | Discharge status: Home / Self Care | Payer: No Typology Code available for payment source | Attending: General Surgery | Admitting: General Surgery

## 2018-09-02 ENCOUNTER — Other Ambulatory Visit: Payer: Self-pay | Admitting: Internal Medicine

## 2018-09-02 ENCOUNTER — Encounter (HOSPITAL_COMMUNITY): Payer: Self-pay | Admitting: Emergency Medicine

## 2018-09-02 ENCOUNTER — Emergency Department (HOSPITAL_COMMUNITY): Payer: Self-pay

## 2018-09-02 DIAGNOSIS — J440 Chronic obstructive pulmonary disease with acute lower respiratory infection: Secondary | ICD-10-CM | POA: Diagnosis not present

## 2018-09-02 DIAGNOSIS — I5033 Acute on chronic diastolic (congestive) heart failure: Secondary | ICD-10-CM | POA: Diagnosis not present

## 2018-09-02 DIAGNOSIS — M25522 Pain in left elbow: Secondary | ICD-10-CM | POA: Diagnosis present

## 2018-09-02 DIAGNOSIS — S2242XA Multiple fractures of ribs, left side, initial encounter for closed fracture: Principal | ICD-10-CM | POA: Diagnosis present

## 2018-09-02 DIAGNOSIS — E785 Hyperlipidemia, unspecified: Secondary | ICD-10-CM | POA: Diagnosis present

## 2018-09-02 DIAGNOSIS — Z87891 Personal history of nicotine dependence: Secondary | ICD-10-CM

## 2018-09-02 DIAGNOSIS — Z833 Family history of diabetes mellitus: Secondary | ICD-10-CM

## 2018-09-02 DIAGNOSIS — Z794 Long term (current) use of insulin: Secondary | ICD-10-CM

## 2018-09-02 DIAGNOSIS — J441 Chronic obstructive pulmonary disease with (acute) exacerbation: Secondary | ICD-10-CM | POA: Diagnosis not present

## 2018-09-02 DIAGNOSIS — W11XXXA Fall on and from ladder, initial encounter: Secondary | ICD-10-CM | POA: Diagnosis present

## 2018-09-02 DIAGNOSIS — I130 Hypertensive heart and chronic kidney disease with heart failure and stage 1 through stage 4 chronic kidney disease, or unspecified chronic kidney disease: Secondary | ICD-10-CM | POA: Diagnosis present

## 2018-09-02 DIAGNOSIS — I10 Essential (primary) hypertension: Secondary | ICD-10-CM | POA: Diagnosis present

## 2018-09-02 DIAGNOSIS — D631 Anemia in chronic kidney disease: Secondary | ICD-10-CM | POA: Diagnosis present

## 2018-09-02 DIAGNOSIS — E1165 Type 2 diabetes mellitus with hyperglycemia: Secondary | ICD-10-CM | POA: Diagnosis present

## 2018-09-02 DIAGNOSIS — E1142 Type 2 diabetes mellitus with diabetic polyneuropathy: Secondary | ICD-10-CM | POA: Diagnosis present

## 2018-09-02 DIAGNOSIS — N183 Chronic kidney disease, stage 3 unspecified: Secondary | ICD-10-CM

## 2018-09-02 DIAGNOSIS — W19XXXA Unspecified fall, initial encounter: Secondary | ICD-10-CM

## 2018-09-02 DIAGNOSIS — I5032 Chronic diastolic (congestive) heart failure: Secondary | ICD-10-CM | POA: Diagnosis present

## 2018-09-02 DIAGNOSIS — N189 Chronic kidney disease, unspecified: Secondary | ICD-10-CM

## 2018-09-02 DIAGNOSIS — N184 Chronic kidney disease, stage 4 (severe): Secondary | ICD-10-CM | POA: Diagnosis present

## 2018-09-02 DIAGNOSIS — Y95 Nosocomial condition: Secondary | ICD-10-CM | POA: Diagnosis not present

## 2018-09-02 DIAGNOSIS — J9601 Acute respiratory failure with hypoxia: Secondary | ICD-10-CM | POA: Diagnosis not present

## 2018-09-02 DIAGNOSIS — J939 Pneumothorax, unspecified: Secondary | ICD-10-CM

## 2018-09-02 DIAGNOSIS — N049 Nephrotic syndrome with unspecified morphologic changes: Secondary | ICD-10-CM | POA: Diagnosis present

## 2018-09-02 DIAGNOSIS — R0602 Shortness of breath: Secondary | ICD-10-CM

## 2018-09-02 DIAGNOSIS — N179 Acute kidney failure, unspecified: Secondary | ICD-10-CM | POA: Diagnosis not present

## 2018-09-02 DIAGNOSIS — E119 Type 2 diabetes mellitus without complications: Secondary | ICD-10-CM

## 2018-09-02 DIAGNOSIS — E1122 Type 2 diabetes mellitus with diabetic chronic kidney disease: Secondary | ICD-10-CM | POA: Diagnosis present

## 2018-09-02 DIAGNOSIS — N182 Chronic kidney disease, stage 2 (mild): Secondary | ICD-10-CM

## 2018-09-02 DIAGNOSIS — J189 Pneumonia, unspecified organism: Secondary | ICD-10-CM | POA: Diagnosis not present

## 2018-09-02 LAB — CBC WITH DIFFERENTIAL/PLATELET
Abs Immature Granulocytes: 0.06 10*3/uL (ref 0.00–0.07)
Basophils Absolute: 0.1 10*3/uL (ref 0.0–0.1)
Basophils Relative: 0 %
Eosinophils Absolute: 0.1 10*3/uL (ref 0.0–0.5)
Eosinophils Relative: 0 %
HCT: 34.8 % — ABNORMAL LOW (ref 39.0–52.0)
Hemoglobin: 10.8 g/dL — ABNORMAL LOW (ref 13.0–17.0)
Immature Granulocytes: 1 %
Lymphocytes Relative: 9 %
Lymphs Abs: 1.1 10*3/uL (ref 0.7–4.0)
MCH: 25.8 pg — ABNORMAL LOW (ref 26.0–34.0)
MCHC: 31 g/dL (ref 30.0–36.0)
MCV: 83.1 fL (ref 80.0–100.0)
Monocytes Absolute: 0.6 10*3/uL (ref 0.1–1.0)
Monocytes Relative: 5 %
Neutro Abs: 10.4 10*3/uL — ABNORMAL HIGH (ref 1.7–7.7)
Neutrophils Relative %: 85 %
Platelets: 176 10*3/uL (ref 150–400)
RBC: 4.19 MIL/uL — ABNORMAL LOW (ref 4.22–5.81)
RDW: 12.8 % (ref 11.5–15.5)
WBC: 12.3 10*3/uL — ABNORMAL HIGH (ref 4.0–10.5)
nRBC: 0 % (ref 0.0–0.2)

## 2018-09-02 LAB — BASIC METABOLIC PANEL
Anion gap: 7 (ref 5–15)
BUN: 60 mg/dL — ABNORMAL HIGH (ref 6–20)
CO2: 25 mmol/L (ref 22–32)
Calcium: 8.2 mg/dL — ABNORMAL LOW (ref 8.9–10.3)
Chloride: 106 mmol/L (ref 98–111)
Creatinine, Ser: 2.19 mg/dL — ABNORMAL HIGH (ref 0.61–1.24)
GFR calc Af Amer: 39 mL/min — ABNORMAL LOW (ref >60)
GFR calc non Af Amer: 34 mL/min — ABNORMAL LOW (ref >60)
Glucose, Bld: 110 mg/dL — ABNORMAL HIGH (ref 70–99)
Potassium: 4 mmol/L (ref 3.5–5.1)
Sodium: 138 mmol/L (ref 135–145)

## 2018-09-02 LAB — GLUCOSE, CAPILLARY: Glucose-Capillary: 252 mg/dL — ABNORMAL HIGH (ref 70–99)

## 2018-09-02 LAB — MRSA PCR SCREENING: MRSA by PCR: NEGATIVE

## 2018-09-02 MED ORDER — ONDANSETRON HCL 4 MG/2ML IJ SOLN: 4.0000 mg | Freq: Four times a day (QID) | INTRAMUSCULAR | Status: DC | PRN

## 2018-09-02 MED ORDER — OXYCODONE HCL 5 MG PO TABS
10.0000 mg | ORAL_TABLET | ORAL | Status: DC | PRN
  Administered 2018-09-03 – 2018-09-05 (×5): 10 mg via ORAL
  Filled 2018-09-02 (×5): qty 2

## 2018-09-02 MED ORDER — ENOXAPARIN SODIUM 40 MG/0.4ML ~~LOC~~ SOLN
40.0000 mg | SUBCUTANEOUS | Status: DC
  Administered 2018-09-03 – 2018-09-06 (×4): 40 mg via SUBCUTANEOUS
  Filled 2018-09-02 (×4): qty 0.4

## 2018-09-02 MED ORDER — METFORMIN HCL ER 500 MG PO TB24: 500.0000 mg | ORAL_TABLET | Freq: Every day | ORAL | Status: DC

## 2018-09-02 MED ORDER — FUROSEMIDE 20 MG PO TABS
20.0000 mg | ORAL_TABLET | Freq: Every day | ORAL | Status: DC
  Administered 2018-09-03 – 2018-09-06 (×4): 20 mg via ORAL
  Filled 2018-09-02 (×4): qty 1

## 2018-09-02 MED ORDER — GABAPENTIN 300 MG PO CAPS
300.0000 mg | ORAL_CAPSULE | Freq: Three times a day (TID) | ORAL | Status: DC
  Administered 2018-09-02 – 2018-09-06 (×13): 300 mg via ORAL
  Filled 2018-09-02 (×14): qty 1

## 2018-09-02 MED ORDER — MORPHINE SULFATE (PF) 2 MG/ML IV SOLN
2.0000 mg | INTRAVENOUS | Status: DC | PRN
  Administered 2018-09-02 – 2018-09-05 (×4): 2 mg via INTRAVENOUS
  Filled 2018-09-02 (×4): qty 1

## 2018-09-02 MED ORDER — ACETAMINOPHEN 325 MG PO TABS: 650.0000 mg | ORAL_TABLET | ORAL | Status: DC | PRN

## 2018-09-02 MED ORDER — IOHEXOL 300 MG/ML  SOLN
60.0000 mL | Freq: Once | INTRAMUSCULAR | Status: AC | PRN
  Administered 2018-09-02: 60 mL via INTRAVENOUS

## 2018-09-02 MED ORDER — ROSUVASTATIN CALCIUM 20 MG PO TABS
20.0000 mg | ORAL_TABLET | Freq: Every day | ORAL | Status: DC
  Administered 2018-09-02 – 2018-09-10 (×9): 20 mg via ORAL
  Filled 2018-09-02 (×9): qty 1

## 2018-09-02 MED ORDER — POTASSIUM CHLORIDE IN NACL 20-0.9 MEQ/L-% IV SOLN
INTRAVENOUS | Status: DC
  Administered 2018-09-02 – 2018-09-06 (×3): via INTRAVENOUS
  Filled 2018-09-02 (×3): qty 1000

## 2018-09-02 MED ORDER — METOPROLOL TARTRATE 25 MG PO TABS
25.0000 mg | ORAL_TABLET | Freq: Two times a day (BID) | ORAL | Status: DC
  Administered 2018-09-02 – 2018-09-10 (×16): 25 mg via ORAL
  Filled 2018-09-02 (×16): qty 1

## 2018-09-02 MED ORDER — OXYCODONE HCL 5 MG PO TABS
5.0000 mg | ORAL_TABLET | ORAL | Status: DC | PRN
  Administered 2018-09-03 – 2018-09-04 (×2): 5 mg via ORAL
  Filled 2018-09-02 (×3): qty 1

## 2018-09-02 MED ORDER — AMLODIPINE BESYLATE 5 MG PO TABS
5.0000 mg | ORAL_TABLET | Freq: Every day | ORAL | Status: DC
  Administered 2018-09-03 – 2018-09-09 (×7): 5 mg via ORAL
  Filled 2018-09-02 (×7): qty 1

## 2018-09-02 MED ORDER — ONDANSETRON 4 MG PO TBDP: 4.0000 mg | ORAL_TABLET | Freq: Four times a day (QID) | ORAL | Status: DC | PRN

## 2018-09-02 MED ORDER — INSULIN ASPART 100 UNIT/ML ~~LOC~~ SOLN
0.0000 [IU] | Freq: Every day | SUBCUTANEOUS | Status: DC
  Administered 2018-09-02: 3 [IU] via SUBCUTANEOUS

## 2018-09-02 MED ORDER — GLIPIZIDE ER 10 MG PO TB24
10.0000 mg | ORAL_TABLET | Freq: Every day | ORAL | Status: DC
  Administered 2018-09-03 – 2018-09-06 (×3): 10 mg via ORAL
  Filled 2018-09-02 (×6): qty 1

## 2018-09-02 MED ORDER — LISINOPRIL 20 MG PO TABS
20.0000 mg | ORAL_TABLET | Freq: Every day | ORAL | Status: DC
  Administered 2018-09-03 – 2018-09-06 (×4): 20 mg via ORAL
  Filled 2018-09-02 (×5): qty 1

## 2018-09-02 MED ORDER — METOLAZONE 5 MG PO TABS
5.0000 mg | ORAL_TABLET | Freq: Every day | ORAL | Status: DC
  Administered 2018-09-03 – 2018-09-10 (×8): 5 mg via ORAL
  Filled 2018-09-02 (×8): qty 1

## 2018-09-02 MED ORDER — INSULIN ASPART 100 UNIT/ML ~~LOC~~ SOLN
0.0000 [IU] | Freq: Three times a day (TID) | SUBCUTANEOUS | Status: DC
  Administered 2018-09-03 (×3): 3 [IU] via SUBCUTANEOUS
  Administered 2018-09-04 (×2): 2 [IU] via SUBCUTANEOUS
  Administered 2018-09-04: 3 [IU] via SUBCUTANEOUS
  Administered 2018-09-05: 8 [IU] via SUBCUTANEOUS
  Administered 2018-09-05 (×2): 5 [IU] via SUBCUTANEOUS

## 2018-09-02 MED ORDER — OXYCODONE-ACETAMINOPHEN 5-325 MG PO TABS
1.0000 | ORAL_TABLET | Freq: Once | ORAL | Status: AC
  Administered 2018-09-02: 1 via ORAL
  Filled 2018-09-02: qty 1

## 2018-09-02 MED ORDER — INSULIN ASPART 100 UNIT/ML ~~LOC~~ SOLN
4.0000 [IU] | Freq: Three times a day (TID) | SUBCUTANEOUS | Status: DC
  Administered 2018-09-03 – 2018-09-05 (×8): 4 [IU] via SUBCUTANEOUS

## 2018-09-02 MED ORDER — FENTANYL CITRATE (PF) 100 MCG/2ML IJ SOLN
50.0000 ug | Freq: Once | INTRAMUSCULAR | Status: AC
  Administered 2018-09-02: 50 ug via INTRAVENOUS
  Filled 2018-09-02: qty 2

## 2018-09-02 NOTE — ED Triage Notes (Signed)
Pt states he was working on a ceiling and pt fell off a ladder onto his left shoulder. Pt states it hurts too bad to raise his arm. Sensation intact. No obvious deformity.

## 2018-09-02 NOTE — ED Provider Notes (Signed)
6:55 PM Care assumed from [facility], PA-C.  At time of transfer care, patient was awaiting results of CT chest and reassessment.  Patient has at least 6 rib fractures on left side after a fall from a ladder.  CT scan shows atelectasis of the left lung but no laceration to the lung or large pulmonary contusion.  No pneumothorax.  Patient was found to have several different ribs fractured in 11 places.  Patient was reassessed and he has not been 10 out of 10 left-sided chest pain and some shortness of breath.  Lungs were auscultated with no asymmetry.  Patient reports he lives alone is not feel safe going home was having rib fractures with worsened chest pain or shortness of breath.  Trauma was called who will come see patient.  Anticipate admission for further monitoring for development of pulmonary contusion or respiratory problems.  Patient has incentive spirometer in the room.  Anticipate following up on trauma recs.    Clinical Impression: 1. Closed fracture of multiple ribs of left side, initial encounter   2. Fall, initial encounter     Disposition: Admit  This note was prepared with assistance of Systems analyst. Occasional wrong-word or sound-a-like substitutions may have occurred due to the inherent limitations of voice recognition software.     Tegeler, Gwenyth Allegra, MD 09/02/18 [phone]

## 2018-09-02 NOTE — Progress Notes (Signed)
Orthopedic Tech Progress Note Patient Details:  Willie Jordan 29-Oct-1965 [id number]  Ortho Devices Type of Ortho Device: Shoulder immobilizer Ortho Device/Splint Interventions: Ordered, Adjustment, Application   Post Interventions Patient Tolerated: Well Instructions Provided: Care of device, Adjustment of device   Saundra Gin J Eldridge Marcott 09/02/2018, 2:49 PM

## 2018-09-02 NOTE — ED Provider Notes (Signed)
[facility] EMERGENCY DEPARTMENT Provider Note   CSN: [id number] Arrival date & time: 09/02/18  1151    History   Chief Complaint Chief Complaint  Patient presents with  . Fall    HPI Willie Jordan is a 53 y.o. male.    HPI   53 year old male presents status post fall.  Patient notes he was on a ladder approximately 5 feet off the ground when he had a mechanical fall landing on his left elbow and ribs.  He notes immediate pain to the left lateral ribs and elbow.  He notes near complete range of motion of the elbow but with pain.  He notes pain in the ribs with deep inspiration.  He denies any shortness of breath, denies any abdominal pain or midline back pain.  No other injuries to note.  No head injury.   Past Medical History:  Diagnosis Date  . Diabetic neuropathy ([place]) 03/17/2017  . Hyperlipidemia   . Hypertension   . Microalbuminuria due to type 2 diabetes mellitus ([place]) 01/30/2017  . Neuropathy   . Onychomycosis of toenail 01/30/2017  . Peripheral neuropathy 03/17/2017  . Type 2 diabetes mellitus, uncontrolled ([place])     Patient Active Problem List   Diagnosis Date Noted  . Chronic diastolic CHF (congestive heart failure) ([place]) 04/10/2018  . Type II diabetes mellitus with renal manifestations ([place]) 04/09/2018  . Acute renal failure superimposed on stage 2 chronic kidney disease ([place]) 04/09/2018  . Puncture wound of right foot   . Severe sepsis ([place]) 07/17/2017  . Community acquired bacterial pneumonia 07/17/2017  . Abdominal pain 07/17/2017  . Nausea and vomiting 07/17/2017  . AKI (acute kidney injury) ([place]) 07/17/2017  . Hyponatremia 07/17/2017  . Acute respiratory failure with hypoxemia ([place]) 07/17/2017  . Retinopathy due to secondary diabetes mellitus, with macular edema, with proliferative retinopathy ([place]) 04/16/2017  . Peripheral neuropathy 03/17/2017  . Diabetic neuropathy ([place]) 03/17/2017  . Microalbuminuria due to type 2 diabetes mellitus  ([place]) 01/30/2017  . Onychomycosis of toenail 01/30/2017  . Diabetic foot infection ([place]) 09/27/2016  . Sepsis ([place]) 09/27/2016  . Diabetes mellitus ([place])   . Pyogenic inflammation of bone ([place])   . Cellulitis of left foot 09/25/2016  . Noncompliance 09/24/2016  . HLD (hyperlipidemia) 09/30/2014  . Essential hypertension 09/30/2014  . Type 2 diabetes mellitus with hyperglycemia ([place]) 09/30/2014    Past Surgical History:  Procedure Laterality Date  . AMPUTATION Left 09/29/2016   Procedure: 2nd RAY AMPUTATION LEFT FOOT I&D LEFT FOOT;  Surgeon: Edrick Kins, DPM;  Location: [facility];  Service: Podiatry;  Laterality: Left;  . APPENDECTOMY          Home Medications    Prior to Admission medications   Medication Sig Start Date End Date Taking? Authorizing Provider  amLODipine (NORVASC) 10 MG tablet 1 tab by mouth daily 08/04/17   Mack Hook, MD  amoxicillin-clavulanate (AUGMENTIN) 875-125 MG tablet Take 1 tablet by mouth 2 (two) times daily. Patient not taking: Reported on 04/22/2018 04/16/18   Mack Hook, MD  blood glucose meter kit and supplies Dispense based on patient and insurance preference. Use up to four times daily as directed. (FOR ICD-9 250.00, 250.01). 10/05/16   Patrecia Pour, MD  Cyanocobalamin (VITAMIN B 12 PO) Take 1 tablet by mouth daily.     [provider]  furosemide (LASIX) 40 MG tablet 1 1/2 tabs by mouth in morning only daily 04/16/18   Mack Hook, MD  gabapentin (NEURONTIN)  300 MG capsule Take 1 capsule (300 mg total) by mouth 3 (three) times daily. 02/19/18   Mack Hook, MD  Insulin Lispro Prot & Lispro (HUMALOG MIX 75/25 KWIKPEN) (75-25) 100 UNIT/ML Kwikpen Inject 26 units before breakfast and 24 units before dinner daily 08/04/17   Mack Hook, MD  Insulin Pen Needle 31G X 5 MM MISC Use as directed twice daily 01/12/17   Mack Hook, MD  lisinopril (PRINIVIL,ZESTRIL) 20 MG tablet Take 0.5 tablets (10 mg total)  by mouth daily. Patient not taking: Reported on 04/22/2018 04/16/18   Mack Hook, MD  metFORMIN (GLUCOPHAGE-XR) 500 MG 24 hr tablet 2 tabs by mouth twice daily with meals 10/28/17   Mack Hook, MD  metoprolol tartrate (LOPRESSOR) 25 MG tablet Take 1 tablet (25 mg total) by mouth 2 (two) times daily. 04/19/18   Mack Hook, MD  Omega-3 Fatty Acids (FISH OIL PO) Take 1 capsule by mouth daily.     [provider]  rosuvastatin (CRESTOR) 20 MG tablet 1 tab by mouth daily with dinner 04/22/18   Mack Hook, MD    Family History Family History  Problem Relation Age of Onset  . Diabetes Mother   . Diabetes Sister   . Diabetes Brother   . Cancer Maternal Grandmother     Social History Social History   Tobacco Use  . Smoking status: Former Smoker    Types: Cigarettes    Start date: 10/31/2015    Last attempt to quit: 01/05/2016    Years since quitting: 2.6  . Smokeless tobacco: Never Used  Substance Use Topics  . Alcohol use: No    Alcohol/week: 0.0 standard drinks    Comment: rarely  . Drug use: No     Allergies   Aspirin   Review of Systems Review of Systems  All other systems reviewed and are negative.  Physical Exam Updated Vital Signs BP 125/62 (BP Location: Right Arm)   Pulse (!) 50   Temp 97.6 F (36.4 C) (Oral)   Resp 16   SpO2 92%   Physical Exam Vitals signs and nursing note reviewed.  Constitutional:      Appearance: He is well-developed.  HENT:     Head: Normocephalic and atraumatic.     Comments: Head is atraumatic Eyes:     General: No scleral icterus.       Right eye: No discharge.        Left eye: No discharge.     Conjunctiva/sclera: Conjunctivae normal.     Pupils: Pupils are equal, round, and reactive to light.  Neck:     Musculoskeletal: Normal range of motion.     Vascular: No JVD.     Trachea: No tracheal deviation.  Pulmonary:     Effort: Pulmonary effort is normal.     Breath sounds: No  stridor.     Comments: Tenderness along the left lateral ribs, no crepitus or bruising, lung expansion is normal although uncomfortable, lung sounds clear throughout Abdominal:     Comments: Abdomen soft nontender  Musculoskeletal:     Comments: No CT or L-spine tenderness-hip stable bilateral with AP and lateral compression bilateral lower extremities nontender-left elbow tender to palpation no swelling or edema, near complete extension noted, grip strength 5 out of 5 sensation intact  Neurological:     Mental Status: He is alert and oriented to person, place, and time.     Coordination: Coordination normal.  Psychiatric:        Behavior: Behavior  normal.        Thought Content: Thought content normal.        Judgment: Judgment normal.      ED Treatments / Results  Labs (all labs ordered are listed, but only abnormal results are displayed) Labs Reviewed  CBC WITH DIFFERENTIAL/PLATELET  BASIC METABOLIC PANEL    EKG None  Radiology Dg Ribs Unilateral W/chest Left  Result Date: 09/02/2018 CLINICAL DATA:  Pain following fall EXAM: LEFT RIBS AND CHEST - 3+ VIEW COMPARISON:  Chest radiograph July 26, 2017 FINDINGS: Frontal chest as well as oblique and cone-down rib images were obtained. There is no appreciable edema or consolidation. There is slight atelectatic change in the bases. Heart size and pulmonary vascularity are within normal limits. No adenopathy. There are fractures of the anterolateral left third, fourth, fifth, sixth, seventh, and eighth ribs with fracture fragments in overall near anatomic alignment. There is no evident pneumothorax or appreciable pleural effusion. IMPRESSION: Multiple rib fractures on the left without pneumothorax or pleural effusion. There is left base atelectasis. There is also slight right base atelectasis. No consolidation. Heart size normal. Electronically Signed   By: Lowella Grip III M.D.   On: 09/02/2018 14:12   Dg Elbow Complete  Left  Result Date: 09/02/2018 CLINICAL DATA:  Pain following fall EXAM: LEFT ELBOW - COMPLETE 3+ VIEW COMPARISON:  None. FINDINGS: Frontal, lateral, and bilateral oblique views were obtained. There is no appreciable fracture or dislocation. No evident joint effusion. There is no appreciable joint space narrowing or erosion. There is calcific tendinosis laterally near the distal humeral metaphysis. IMPRESSION: Calcific tendinosis laterally. No appreciable joint space narrowing or erosion. No evident fracture or dislocation. Electronically Signed   By: Lowella Grip III M.D.   On: 09/02/2018 14:13   US Renal  Result Date: 09/02/2018 CLINICAL DATA:  Stage 3 chronic kidney disease. EXAM: RENAL / URINARY TRACT ULTRASOUND COMPLETE COMPARISON:  May 25, 2007 FINDINGS: Right Kidney: Renal measurements: 12.3 x 4.6 x 5.6 cm = volume: 168 mL . Echogenicity within normal limits. No mass or hydronephrosis visualized. Left Kidney: Renal measurements: 12.8 x 6.6 x 5.9 cm = volume: 262 mL. Echogenicity within normal limits. No mass or hydronephrosis visualized. Bladder: Appears normal for degree of bladder distention. IMPRESSION: Normal renal ultrasound. Electronically Signed   By: Abelardo Diesel M.D.   On: 09/02/2018 09:42    Procedures Procedures (including critical care time)  Medications Ordered in ED Medications  oxyCODONE-acetaminophen (PERCOCET/ROXICET) 5-325 MG per tablet 1 tablet (1 tablet Oral Given 09/02/18 1320)     Initial Impression / Assessment and Plan / ED Course  I have reviewed the triage vital signs and the nursing notes.  Pertinent labs & imaging results that were available during my care of the patient were reviewed by me and considered in my medical decision making (see chart for details).        53 year old male presents status post fall.  He is requesting family friend to be interpreter.  Video interpreter malfunctioning at this time.  Patient has 6 rib fractures along the  left lateral ribs.  He has clear lung sounds and no signs of pneumothorax.  Given the significant number of fractures CT with contrast will be ordered.  Patient is resting comfortably in exam bed.  Patient care signed to oncoming provider pending CT and disposition.    Final Clinical Impressions(s) / ED Diagnoses   Final diagnoses:  Closed fracture of multiple ribs of left side, initial encounter  ED Discharge Orders    None       Okey Regal, Hershal Coria 09/02/18 1511    Tegeler, Gwenyth Allegra, MD 09/02/18 [phone]

## 2018-09-02 NOTE — H&P (Signed)
Willie Jordan is an 53 y.o. male.   Chief Complaint: Left rib pain HPI: This is a 53 year old male who fell about 5 feet from a ladder, landing on his left side.  He presented with left rib and left elbow pain.  He presented to the ED for evaluation and was found to have multiple left rib fractures.  He was mildly short of breath.  He lives alone.    Past Medical History:  Diagnosis Date  . Diabetic neuropathy ([place]) 03/17/2017  . Hyperlipidemia   . Hypertension   . Microalbuminuria due to type 2 diabetes mellitus ([place]) 01/30/2017  . Neuropathy   . Onychomycosis of toenail 01/30/2017  . Peripheral neuropathy 03/17/2017  . Type 2 diabetes mellitus, uncontrolled ([place])     Past Surgical History:  Procedure Laterality Date  . AMPUTATION Left 09/29/2016   Procedure: 2nd RAY AMPUTATION LEFT FOOT I&D LEFT FOOT;  Surgeon: Edrick Kins, DPM;  Location: [facility];  Service: Podiatry;  Laterality: Left;  . APPENDECTOMY      Family History  Problem Relation Age of Onset  . Diabetes Mother   . Diabetes Sister   . Diabetes Brother   . Cancer Maternal Grandmother    Social History:  reports that he quit smoking about 2 years ago. His smoking use included cigarettes. He started smoking about 2 years ago. He has never used smokeless tobacco. He reports that he does not drink alcohol or use drugs.  Allergies:  Allergies  Allergen Reactions  . Aspirin Rash    Prior to Admission medications   Medication Sig Start Date End Date Taking? Authorizing Provider  amLODipine (NORVASC) 10 MG tablet 1 tab by mouth daily Patient taking differently: 5 mg. 1 tab by mouth daily 08/04/17  Yes Mack Hook, MD  furosemide (LASIX) 40 MG tablet 1 1/2 tabs by mouth in morning only daily 04/16/18  Yes Mack Hook, MD  gabapentin (NEURONTIN) 300 MG capsule Take 1 capsule (300 mg total) by mouth 3 (three) times daily. 02/19/18  Yes Mack Hook, MD  glipiZIDE (GLUCOTROL XL) 5 MG 24 hr tablet Take 10 mg  by mouth daily with breakfast.  07/09/18  Yes [provider]  ibuprofen (ADVIL,MOTRIN) 800 MG tablet Take 800 mg by mouth 4 (four) times daily. 08/11/18  Yes [provider]  Insulin Lispro Prot & Lispro (HUMALOG MIX 75/25 KWIKPEN) (75-25) 100 UNIT/ML Kwikpen Inject 26 units before breakfast and 24 units before dinner daily 08/04/17  Yes Mack Hook, MD  lisinopril (PRINIVIL,ZESTRIL) 20 MG tablet Take 0.5 tablets (10 mg total) by mouth daily. Patient taking differently: Take 20 mg by mouth daily.  04/16/18  Yes Mack Hook, MD  metFORMIN (GLUCOPHAGE-XR) 500 MG 24 hr tablet 2 tabs by mouth twice daily with meals 10/28/17  Yes Mack Hook, MD  metolazone (ZAROXOLYN) 5 MG tablet Take 5 mg by mouth daily.   Yes [provider]  metoprolol tartrate (LOPRESSOR) 25 MG tablet Take 1 tablet (25 mg total) by mouth 2 (two) times daily. 04/19/18  Yes Mack Hook, MD  rosuvastatin (CRESTOR) 20 MG tablet 1 tab by mouth daily with dinner 04/22/18  Yes Mack Hook, MD  amoxicillin-clavulanate (AUGMENTIN) 875-125 MG tablet Take 1 tablet by mouth 2 (two) times daily. Patient not taking: Reported on 04/22/2018 04/16/18   Mack Hook, MD  blood glucose meter kit and supplies Dispense based on patient and insurance preference. Use up to four times daily as directed. (FOR ICD-9 250.00, 250.01). 10/05/16  Patrecia Pour, MD  Cyanocobalamin (VITAMIN B 12 PO) Take 1 tablet by mouth daily.     [provider]  Insulin Pen Needle 31G X 5 MM MISC Use as directed twice daily 01/12/17   Mack Hook, MD  Omega-3 Fatty Acids (FISH OIL PO) Take 1 capsule by mouth daily.     [provider]     Results for orders placed or performed during the hospital encounter of 09/02/18 (from the past 48 hour(s))  CBC with Differential     Status: Abnormal   Collection Time: 09/02/18  3:10 PM  Result Value Ref Range   WBC 12.3 (H) 4.0 - 10.5 K/uL    RBC 4.19 (L) 4.22 - 5.81 MIL/uL   Hemoglobin 10.8 (L) 13.0 - 17.0 g/dL   HCT 34.8 (L) 39.0 - 52.0 %   MCV 83.1 80.0 - 100.0 fL   MCH 25.8 (L) 26.0 - 34.0 pg   MCHC 31.0 30.0 - 36.0 g/dL   RDW 12.8 11.5 - 15.5 %   Platelets 176 150 - 400 K/uL   nRBC 0.0 0.0 - 0.2 %   Neutrophils Relative % 85 %   Neutro Abs 10.4 (H) 1.7 - 7.7 K/uL   Lymphocytes Relative 9 %   Lymphs Abs 1.1 0.7 - 4.0 K/uL   Monocytes Relative 5 %   Monocytes Absolute 0.6 0.1 - 1.0 K/uL   Eosinophils Relative 0 %   Eosinophils Absolute 0.1 0.0 - 0.5 K/uL   Basophils Relative 0 %   Basophils Absolute 0.1 0.0 - 0.1 K/uL   Immature Granulocytes 1 %   Abs Immature Granulocytes 0.06 0.00 - 0.07 K/uL    Comment: Performed at [facility], [address] [place], [place] [address]  Basic metabolic panel     Status: Abnormal   Collection Time: 09/02/18  3:10 PM  Result Value Ref Range   Sodium 138 135 - 145 mmol/L   Potassium 4.0 3.5 - 5.1 mmol/L   Chloride 106 98 - 111 mmol/L   CO2 25 22 - 32 mmol/L   Glucose, Bld 110 (H) 70 - 99 mg/dL   BUN 60 (H) 6 - 20 mg/dL   Creatinine, Ser 2.19 (H) 0.61 - 1.24 mg/dL   Calcium 8.2 (L) 8.9 - 10.3 mg/dL   GFR calc non Af Amer 34 (L) >60 mL/min   GFR calc Af Amer 39 (L) >60 mL/min   Anion gap 7 5 - 15    Comment: Performed at [facility] [address] [place], [place] [address]   Dg Ribs Unilateral W/chest Left  Result Date: 09/02/2018 CLINICAL DATA:  Pain following fall EXAM: LEFT RIBS AND CHEST - 3+ VIEW COMPARISON:  Chest radiograph July 26, 2017 FINDINGS: Frontal chest as well as oblique and cone-down rib images were obtained. There is no appreciable edema or consolidation. There is slight atelectatic change in the bases. Heart size and pulmonary vascularity are within normal limits. No adenopathy. There are fractures of the anterolateral left third, fourth, fifth, sixth, seventh, and eighth ribs with fracture fragments in overall near anatomic alignment.  There is no evident pneumothorax or appreciable pleural effusion. IMPRESSION: Multiple rib fractures on the left without pneumothorax or pleural effusion. There is left base atelectasis. There is also slight right base atelectasis. No consolidation. Heart size normal. Electronically Signed   By: Lowella Grip III M.D.   On: 09/02/2018 14:12   Dg Elbow Complete Left  Result Date: 09/02/2018  CLINICAL DATA:  Pain following fall EXAM: LEFT ELBOW - COMPLETE 3+ VIEW COMPARISON:  None. FINDINGS: Frontal, lateral, and bilateral oblique views were obtained. There is no appreciable fracture or dislocation. No evident joint effusion. There is no appreciable joint space narrowing or erosion. There is calcific tendinosis laterally near the distal humeral metaphysis. IMPRESSION: Calcific tendinosis laterally. No appreciable joint space narrowing or erosion. No evident fracture or dislocation. Electronically Signed   By: Lowella Grip III M.D.   On: 09/02/2018 14:13   Ct Chest W Contrast  Result Date: 09/02/2018 CLINICAL DATA:  Multiple rib fractures.  Status post fall. EXAM: CT CHEST WITH CONTRAST TECHNIQUE: Multidetector CT imaging of the chest was performed during intravenous contrast administration. CONTRAST:  69m OMNIPAQUE IOHEXOL 300 MG/ML  SOLN COMPARISON:  Current chest and rib radiographs and prior chest radiographs. FINDINGS: Cardiovascular: Heart is top-normal in size. No pericardial effusion. Mild three-vessel coronary artery calcifications. Mild aortic atherosclerotic calcifications. Great vessels are normal in caliber. Mediastinum/Nodes: No neck base or axillary masses or enlarged lymph nodes. There are several prominent mediastinal lymph nodes, largest a right paratracheal node, 12 mm in short axis. No mediastinal or hilar masses. No hilar lymphadenopathy. Trachea and esophagus are unremarkable. Lungs/Pleura: No pneumothorax. No pleural effusion and/or hemothorax. There is linear and patchy opacity  in the inferior, posterolateral left upper lobe, lingula and adjacent lateral left lower lobe, consistent with atelectasis. There is mild posterior subsegmental atelectasis at the base of the left lower lobe, minimally seen on the right as well. Remainder of the lungs is clear. Upper Abdomen: No acute findings. No evidence of injury on this unenhanced study. Musculoskeletal: There are multiple left-sided rib fractures, nondisplaced. There are bilateral fractures of the left third, fourth, fifth and sixth ribs and likely the seventh rib. There are posterior fractures of the left fourth through ninth ribs. No other fractures.  No bone lesions. IMPRESSION: 1. Multiple left-sided rib fractures as described above, nondisplaced, with no fracture complication. Specifically, no evidence of a lung laceration, pneumothorax or hemothorax. 2. There is mild atelectasis in the left lateral lower lung adjacent to rib fractures. 3. No other acute finding within the chest. 4. Aortic atherosclerosis. Aortic Atherosclerosis (ICD10-I70.0). Electronically Signed   By: DLajean ManesM.D.   On: 09/02/2018 17:38   [place]al  Result Date: 09/02/2018 CLINICAL DATA:  Stage 3 chronic kidney disease. EXAM: RENAL / URINARY TRACT ULTRASOUND COMPLETE COMPARISON:  May 25, 2007 FINDINGS: Right Kidney: Renal measurements: 12.3 x 4.6 x 5.6 cm = volume: 168 mL . Echogenicity within normal limits. No mass or hydronephrosis visualized. Left Kidney: Renal measurements: 12.8 x 6.6 x 5.9 cm = volume: 262 mL. Echogenicity within normal limits. No mass or hydronephrosis visualized. Bladder: Appears normal for degree of bladder distention. IMPRESSION: Normal renal ultrasound. Electronically Signed   By: WAbelardo DieselM.D.   On: 09/02/2018 09:42    Review of Systems  Constitutional: Negative for weight loss.  HENT: Negative for ear discharge, ear pain, hearing loss and tinnitus.   Eyes: Negative for blurred vision, double vision, photophobia and  pain.  Respiratory: Positive for shortness of breath. Negative for cough and sputum production.   Cardiovascular: Positive for chest pain.  Gastrointestinal: Negative for abdominal pain, nausea and vomiting.  Genitourinary: Negative for dysuria, flank pain, frequency and urgency.  Musculoskeletal: Positive for joint pain (Left elbow pain). Negative for back pain, falls, myalgias and neck pain.  Neurological: Negative for dizziness, tingling, sensory change, focal weakness, loss  of consciousness and headaches.  Endo/Heme/Allergies: Does not bruise/bleed easily.  Psychiatric/Behavioral: Negative for depression, memory loss and substance abuse. The patient is not nervous/anxious.     Blood pressure 125/62, pulse (!) 50, temperature 97.6 F (36.4 C), temperature source Oral, resp. rate 16, SpO2 92 %. Physical Exam  Vitals reviewed. Constitutional: He is oriented to person, place, and time. He appears well-developed and well-nourished. He is cooperative. No distress.  HENT:  Head: Normocephalic and atraumatic. Head is without raccoon's eyes, without Battle's sign, without abrasion, without contusion and without laceration.  Right Ear: Hearing, tympanic membrane, external ear and ear canal normal. No lacerations. No drainage or tenderness. No foreign bodies. Tympanic membrane is not perforated. No hemotympanum.  Left Ear: Hearing, tympanic membrane, external ear and ear canal normal. No lacerations. No drainage or tenderness. No foreign bodies. Tympanic membrane is not perforated. No hemotympanum.  Nose: Nose normal. No nose lacerations, sinus tenderness, nasal deformity or nasal septal hematoma. No epistaxis.  Mouth/Throat: Uvula is midline, oropharynx is clear and moist and mucous membranes are normal. No lacerations.  Eyes: Pupils are equal, round, and reactive to light. Conjunctivae, EOM and lids are normal. No scleral icterus.  Neck: Trachea normal. No JVD present. No spinous process tenderness  and no muscular tenderness present. Carotid bruit is not present. No thyromegaly present.  Cardiovascular: Normal rate, regular rhythm, normal heart sounds, intact distal pulses and normal pulses.  Respiratory: Effort normal and breath sounds normal. No respiratory distress. He exhibits no tenderness, no bony tenderness, no laceration and no crepitus.  Left chest wall lateral and posterior tenderness   GI: Soft. Normal appearance. He exhibits no distension. Bowel sounds are decreased. There is no abdominal tenderness. There is no rigidity, no rebound, no guarding and no CVA tenderness.  Musculoskeletal: Normal range of motion.        General: No tenderness or edema.     Comments: Left elbow tenderness/ FROM  Lymphadenopathy:    He has no cervical adenopathy.  Neurological: He is alert and oriented to person, place, and time. He has normal strength. No cranial nerve deficit or sensory deficit. GCS eye subscore is 4. GCS verbal subscore is 5. GCS motor subscore is 6.  Skin: Skin is warm, dry and intact. He is not diaphoretic.  Psychiatric: He has a normal mood and affect. His speech is normal and behavior is normal.     Assessment/Plan 1.  Fall from ladder 2.  Multiple left rib fractures - some with multiple segmental fractures 3.  Diabetes/ hypertension  Admit for observation Continue home meds Pain control IS   Maia Petties, MD 09/02/2018, 7:21 PM

## 2018-09-03 ENCOUNTER — Other Ambulatory Visit: Payer: Self-pay

## 2018-09-03 ENCOUNTER — Observation Stay (HOSPITAL_COMMUNITY): Payer: Self-pay

## 2018-09-03 LAB — BASIC METABOLIC PANEL
Anion gap: 8 (ref 5–15)
BUN: 54 mg/dL — ABNORMAL HIGH (ref 6–20)
CO2: 24 mmol/L (ref 22–32)
Calcium: 8.1 mg/dL — ABNORMAL LOW (ref 8.9–10.3)
Chloride: 104 mmol/L (ref 98–111)
Creatinine, Ser: 2.15 mg/dL — ABNORMAL HIGH (ref 0.61–1.24)
GFR calc Af Amer: 40 mL/min — ABNORMAL LOW (ref >60)
GFR calc non Af Amer: 35 mL/min — ABNORMAL LOW (ref >60)
GLUCOSE: 197 mg/dL — AB (ref 70–99)
Potassium: 3.6 mmol/L (ref 3.5–5.1)
Sodium: 136 mmol/L (ref 135–145)

## 2018-09-03 LAB — CBC
HCT: 33 % — ABNORMAL LOW (ref 39.0–52.0)
Hemoglobin: 10.5 g/dL — ABNORMAL LOW (ref 13.0–17.0)
MCH: 26.3 pg (ref 26.0–34.0)
MCHC: 31.8 g/dL (ref 30.0–36.0)
MCV: 82.5 fL (ref 80.0–100.0)
Platelets: 177 10*3/uL (ref 150–400)
RBC: 4 MIL/uL — ABNORMAL LOW (ref 4.22–5.81)
RDW: 13.2 % (ref 11.5–15.5)
WBC: 8.8 10*3/uL (ref 4.0–10.5)
nRBC: 0 % (ref 0.0–0.2)

## 2018-09-03 LAB — GLUCOSE, CAPILLARY
GLUCOSE-CAPILLARY: 162 mg/dL — AB (ref 70–99)
Glucose-Capillary: 182 mg/dL — ABNORMAL HIGH (ref 70–99)
Glucose-Capillary: 160 mg/dL — ABNORMAL HIGH (ref 70–99)
Glucose-Capillary: 199 mg/dL — ABNORMAL HIGH (ref 70–99)

## 2018-09-03 LAB — HIV ANTIBODY (ROUTINE TESTING W REFLEX): HIV Screen 4th Generation wRfx: NONREACTIVE

## 2018-09-03 MED ORDER — ACETAMINOPHEN 500 MG PO TABS
1000.0000 mg | ORAL_TABLET | Freq: Four times a day (QID) | ORAL | Status: DC
  Administered 2018-09-03 – 2018-09-10 (×26): 1000 mg via ORAL
  Filled 2018-09-03 (×30): qty 2

## 2018-09-03 MED ORDER — METHOCARBAMOL 500 MG PO TABS
500.0000 mg | ORAL_TABLET | Freq: Three times a day (TID) | ORAL | Status: DC
  Administered 2018-09-03 – 2018-09-05 (×9): 500 mg via ORAL
  Filled 2018-09-03 (×9): qty 1

## 2018-09-03 MED ORDER — DIPHENHYDRAMINE HCL 25 MG PO CAPS
25.0000 mg | ORAL_CAPSULE | Freq: Four times a day (QID) | ORAL | Status: DC | PRN
  Administered 2018-09-03 – 2018-09-08 (×5): 25 mg via ORAL
  Filled 2018-09-03 (×5): qty 1

## 2018-09-03 NOTE — Progress Notes (Signed)
[facility] Progress Note     Subjective: CC: pain in L chest Patient denies SOB. Using IS and pulling to 1000. Reports no pain at rest but severe pain with coughing or movement. Denies pain elsewhere.   Objective: Vital signs in last 24 hours: Temp:  [97.6 F (36.4 C)-97.7 F (36.5 C)] 97.7 F (36.5 C) (02/27 2033) Pulse Rate:  [50-60] 60 (02/27 2231) Resp:  [16-20] 20 (02/27 2033) BP: (125-143)/(62-73) 143/73 (02/27 2231) SpO2:  [90 %-92 %] 90 % (02/27 2033) Last BM Date: 09/01/18  Intake/Output from previous day: 02/27 0701 - 02/28 0700 In: -  Out: 500 [Urine:500] Intake/Output this shift: Total I/O In: -  Out: 200 [Urine:200]  PE: Gen:  Alert, NAD, pleasant Card:  Regular rate and rhythm,  Pulm:  Normal effort, clear to auscultation bilaterally, L chest wall TTP Abd: Soft, non-tender, non-distended, +BS Skin: warm and dry, no rashes  Psych: A&Ox3   Lab Results:  Recent Labs    09/02/18 1510  WBC 12.3*  HGB 10.8*  HCT 34.8*  PLT 176   BMET Recent Labs    09/02/18 1510  NA 138  K 4.0  CL 106  CO2 25  GLUCOSE 110*  BUN 60*  CREATININE 2.19*  CALCIUM 8.2*   PT/INR No results for input(s): LABPROT, INR in the last 72 hours. CMP     Component Value Date/Time   NA 138 09/02/2018 1510   NA 137 04/19/2018 1256   K 4.0 09/02/2018 1510   CL 106 09/02/2018 1510   CO2 25 09/02/2018 1510   GLUCOSE 110 (H) 09/02/2018 1510   BUN 60 (H) 09/02/2018 1510   BUN 34 (H) 04/19/2018 1256   CREATININE 2.19 (H) 09/02/2018 1510   CALCIUM 8.2 (L) 09/02/2018 1510   PROT 6.0 04/16/2018 1114   ALBUMIN 2.8 (L) 04/16/2018 1114   AST 22 04/16/2018 1114   ALT 24 04/16/2018 1114   ALKPHOS 117 04/16/2018 1114   BILITOT <0.2 04/16/2018 1114   GFRNONAA 34 (L) 09/02/2018 1510   GFRAA 39 (L) 09/02/2018 1510   Lipase     Component Value Date/Time   LIPASE 150 (H) 05/24/2007 1515       Studies/Results: Dg Ribs Unilateral W/chest Left  Result Date:  09/02/2018 CLINICAL DATA:  Pain following fall EXAM: LEFT RIBS AND CHEST - 3+ VIEW COMPARISON:  Chest radiograph July 26, 2017 FINDINGS: Frontal chest as well as oblique and cone-down rib images were obtained. There is no appreciable edema or consolidation. There is slight atelectatic change in the bases. Heart size and pulmonary vascularity are within normal limits. No adenopathy. There are fractures of the anterolateral left third, fourth, fifth, sixth, seventh, and eighth ribs with fracture fragments in overall near anatomic alignment. There is no evident pneumothorax or appreciable pleural effusion. IMPRESSION: Multiple rib fractures on the left without pneumothorax or pleural effusion. There is left base atelectasis. There is also slight right base atelectasis. No consolidation. Heart size normal. Electronically Signed   By: Lowella Grip III M.D.   On: 09/02/2018 14:12   Dg Elbow Complete Left  Result Date: 09/02/2018 CLINICAL DATA:  Pain following fall EXAM: LEFT ELBOW - COMPLETE 3+ VIEW COMPARISON:  None. FINDINGS: Frontal, lateral, and bilateral oblique views were obtained. There is no appreciable fracture or dislocation. No evident joint effusion. There is no appreciable joint space narrowing or erosion. There is calcific tendinosis laterally near the distal humeral metaphysis. IMPRESSION: Calcific tendinosis laterally. No appreciable joint space  narrowing or erosion. No evident fracture or dislocation. Electronically Signed   By: Lowella Grip III M.D.   On: 09/02/2018 14:13   Ct Chest W Contrast  Result Date: 09/02/2018 CLINICAL DATA:  Multiple rib fractures.  Status post fall. EXAM: CT CHEST WITH CONTRAST TECHNIQUE: Multidetector CT imaging of the chest was performed during intravenous contrast administration. CONTRAST:  76mL OMNIPAQUE IOHEXOL 300 MG/ML  SOLN COMPARISON:  Current chest and rib radiographs and prior chest radiographs. FINDINGS: Cardiovascular: Heart is top-normal in  size. No pericardial effusion. Mild three-vessel coronary artery calcifications. Mild aortic atherosclerotic calcifications. Great vessels are normal in caliber. Mediastinum/Nodes: No neck base or axillary masses or enlarged lymph nodes. There are several prominent mediastinal lymph nodes, largest a right paratracheal node, 12 mm in short axis. No mediastinal or hilar masses. No hilar lymphadenopathy. Trachea and esophagus are unremarkable. Lungs/Pleura: No pneumothorax. No pleural effusion and/or hemothorax. There is linear and patchy opacity in the inferior, posterolateral left upper lobe, lingula and adjacent lateral left lower lobe, consistent with atelectasis. There is mild posterior subsegmental atelectasis at the base of the left lower lobe, minimally seen on the right as well. Remainder of the lungs is clear. Upper Abdomen: No acute findings. No evidence of injury on this unenhanced study. Musculoskeletal: There are multiple left-sided rib fractures, nondisplaced. There are bilateral fractures of the left third, fourth, fifth and sixth ribs and likely the seventh rib. There are posterior fractures of the left fourth through ninth ribs. No other fractures.  No bone lesions. IMPRESSION: 1. Multiple left-sided rib fractures as described above, nondisplaced, with no fracture complication. Specifically, no evidence of a lung laceration, pneumothorax or hemothorax. 2. There is mild atelectasis in the left lateral lower lung adjacent to rib fractures. 3. No other acute finding within the chest. 4. Aortic atherosclerosis. Aortic Atherosclerosis (ICD10-I70.0). Electronically Signed   By: Lajean Manes M.D.   On: 09/02/2018 17:38   US Renal  Result Date: 09/02/2018 CLINICAL DATA:  Stage 3 chronic kidney disease. EXAM: RENAL / URINARY TRACT ULTRASOUND COMPLETE COMPARISON:  May 25, 2007 FINDINGS: Right Kidney: Renal measurements: 12.3 x 4.6 x 5.6 cm = volume: 168 mL . Echogenicity within normal limits. No mass  or hydronephrosis visualized. Left Kidney: Renal measurements: 12.8 x 6.6 x 5.9 cm = volume: 262 mL. Echogenicity within normal limits. No mass or hydronephrosis visualized. Bladder: Appears normal for degree of bladder distention. IMPRESSION: Normal renal ultrasound. Electronically Signed   By: Abelardo Diesel M.D.   On: 09/02/2018 09:42    Anti-infectives: Anti-infectives (From admission, onward)   None       Assessment/Plan Fall from ladder L ribs 3-9 fractures (some segmental) - multimodal pain control, IS, pulmonary toilet HTN - home meds T2DM - ok to have glipizide and SSI, hold metformin 48h after contrasted scan yesterday  FEN: CM diet, IVF VTE: SCDs, lovenox ID: no abx indicated at this time  Dispo: Pain control. PT/OT. Repeat labs  LOS: 0 days    Brigid Re , [facility] 09/03/2018, 9:15 AM Pager: [phone]

## 2018-09-03 NOTE — Discharge Instructions (Signed)
Fractura de costillas  Rib Fracture    Una fractura de costilla es la ruptura o fisura de uno de los huesos que la forman. Las costillas son como una jaula que rodea la parte superior del pecho. La fractura o fisura de una costilla puede ser dolorosa pero no causa otros problemas. La mayora de las fracturas de costillas se curan por s solas en un plazo de 1 a 3 meses.  Siga estas indicaciones en su casa:  Control del dolor, la rigidez y la hinchazn   Si se lo indican, aplique hielo sobre la zona lesionada.  ? Ponga el hielo en una bolsa plstica.  ? Coloque una toalla entre la piel y la bolsa de hielo.  ? Coloque el hielo durante 20minutos, 2 o 3veces por da.   Tome los medicamentos de venta libre y los recetados solamente como se lo haya indicado el mdico.  Actividad   Evite las actividades que puedan causar dolor en la zona lesionada. Proteja la zona lesionada.   Aumente lentamente la actividad segn las indicaciones del mdico.  Instrucciones generales   Haga ejercicios de respiracin profunda como se lo haya indicado el mdico. Es posible que le indiquen lo siguiente:  ? Haga respiraciones profundas varias veces al da.  ? Tosa varias veces al da mientras abraza una almohada.  ? Use un dispositivo (espirmetro de incentivo) para realizar respiraciones profundas varias veces al da.   Beba suficiente lquido para mantener el pis (orina) claro o de color amarillo plido.   No use cinturones ni sujetadores. No dejan que respire profundamente.   Concurra a todas las visitas de control como se lo haya indicado el mdico. Esto es importante.  Comunquese con un mdico si:   Tiene fiebre.  Solicite ayuda de inmediato si:   Tiene dificultad para respirar.   Le falta el aire.   No puede dejar de toser.   Tose y despide saliva espesa o con sangre (esputo).   Tiene malestar estomacal (nuseas), vomita o tiene dolor de panza (abdominal).   El dolor empeora y los medicamentos no hacen efecto.  Resumen    Una fractura de costilla es la ruptura o fisura de uno de los huesos que la forman.   Aplique hielo sobre la zona lesionada y tome los medicamentos para calmar el dolor como se lo haya indicado el mdico.   Haga respiraciones profundas y tosa varias veces al da. Abrace una almohada cada vez que tosa.  Esta informacin no tiene como fin reemplazar el consejo del mdico. Asegrese de hacerle al mdico cualquier pregunta que tenga.  Document Released: 02/23/2013 Document Revised: 03/05/2017 Document Reviewed: 03/05/2017  Elsevier Interactive Patient Education  2019 [employer]

## 2018-09-03 NOTE — Progress Notes (Signed)
CSW completed SBIRT with the patient. The patient denies any use of alcohol or drugs. No further interventions are required at this time.   CSW signing off.   Domenic Schwab, MSW, [place]

## 2018-09-03 NOTE — Progress Notes (Signed)
Physical Therapy Evaluation Patient Details Name: Willie Jordan MRN: [id number] DOB: July 29, 1965 Today's Date: 09/03/2018   History of Present Illness  Patient is 53 y/o male admitted to hosptial following a fall from a ladder. Patient with multiple posterior and lateral L rib fxs (3-9). Elbow x-ray negative. PMH includes HTN, DMII, HLD, and peripheral neuropathy.   Clinical Impression  Patient admitted to hospital secondary to problems above and with deficits below. Patient required 1HHA and min guard to stand. Required supervision to ambulate short distance to chair. Further mobility deferred secondary to pain levels. Anticipate patient will progress functional mobility when pain levels decrease. Patient will benefit from acute physical therapy to maximize independence and safety with functional mobility.     Follow Up Recommendations No PT follow up    Equipment Recommendations  None recommended by PT    Recommendations for Other Services       Precautions / Restrictions Precautions Precautions: Fall Required Braces or Orthoses: Sling(for comfort; no order. ) Restrictions Weight Bearing Restrictions: No      Mobility  Bed Mobility Overal bed mobility: Needs Assistance Bed Mobility: Rolling;Supine to Sit;Sit to Supine Rolling: Supervision   Supine to sit: Supervision Sit to supine: Supervision   General bed mobility comments: Patient required supervision for bed mobility for safety. Required increased time and effort secondary to pain. Verbal cues for log roll technique to R getting in/out of bed  Transfers Overall transfer level: Needs assistance Equipment used: 1 person hand held assist Transfers: Sit to/from Stand Sit to Stand: Min guard         General transfer comment: Patient required [place] and min guard to stand. Patient denies dizziness in standing.   Ambulation/Gait Ambulation/Gait assistance: Supervision Gait Distance (Feet): 5 Feet Assistive device:  None Gait Pattern/deviations: Step-through pattern;Decreased stride length Gait velocity: decreased Gait velocity interpretation: <1.8 ft/sec, indicate of risk for recurrent falls General Gait Details: Patient ambulated short distance to chair with supervision for safety. Patient with decreased gait speed secondary to pain. Further mobility deferred secondary to pain.  Stairs            Wheelchair Mobility    Modified Rankin (Stroke Patients Only)       Balance Overall balance assessment: Needs assistance Sitting-balance support: No upper extremity supported;Feet supported Sitting balance-Leahy Scale: Good     Standing balance support: No upper extremity supported Standing balance-Leahy Scale: Good Standing balance comment: No LOB in standing                             Pertinent Vitals/Pain Pain Assessment: 0-10 Pain Score: 10-Worst pain ever Pain Location: L ribs Pain Descriptors / Indicators: Aching Pain Intervention(s): Limited activity within patient's tolerance;Monitored during session;Repositioned    Home Living Family/patient expects to be discharged to:: Private residence Living Arrangements: Alone Available Help at Discharge: Other (Comment)(no assistance at home) Type of Home: House Home Access: Stairs to enter Entrance Stairs-Rails: Left Entrance Stairs-Number of Steps: 4 Home Layout: One level Home Equipment: Walker - 2 wheels      Prior Function Level of Independence: Independent               Hand Dominance        Extremity/Trunk Assessment   Upper Extremity Assessment Upper Extremity Assessment: Defer to OT evaluation    Lower Extremity Assessment Lower Extremity Assessment: Overall WFL for tasks assessed    Cervical / Trunk Assessment Cervical / Trunk  Assessment: Other exceptions(L ribs fxs)  Communication   Communication: No difficulties  Cognition Arousal/Alertness: Awake/alert Behavior During Therapy: WFL  for tasks assessed/performed Overall Cognitive Status: Within Functional Limits for tasks assessed                                        General Comments General comments (skin integrity, edema, etc.): Patient family in room during session. Educated about bracing technique with pillow for pain relief    Exercises     Assessment/Plan    PT Assessment Patient needs continued PT services  PT Problem List Decreased strength;Decreased range of motion;Decreased activity tolerance;Decreased balance;Decreased mobility;Pain       PT Treatment Interventions Gait training;Stair training;Functional mobility training;Therapeutic activities;Therapeutic exercise;Balance training;Patient/family education    PT Goals (Current goals can be found in the Care Plan section)  Acute Rehab PT Goals Patient Stated Goal: "have less pain" PT Goal Formulation: With patient Time For Goal Achievement: 09/17/18 Potential to Achieve Goals: Good    Frequency Min 3X/week   Barriers to discharge        Co-evaluation               AM-PAC PT "6 Clicks" Mobility  Outcome Measure Help needed turning from your back to your side while in a flat bed without using bedrails?: None Help needed moving from lying on your back to sitting on the side of a flat bed without using bedrails?: None Help needed moving to and from a bed to a chair (including a wheelchair)?: None Help needed standing up from a chair using your arms (e.g., wheelchair or bedside chair)?: A Little Help needed to walk in hospital room?: A Little Help needed climbing 3-5 steps with a railing? : A Lot 6 Click Score: 20    End of Session   Activity Tolerance: Patient limited by pain Patient left: in chair;with call bell/phone within reach;with family/visitor present Nurse Communication: Mobility status PT Visit Diagnosis: Pain;Other abnormalities of gait and mobility (R26.89) Pain - Right/Left: Left Pain - part of body:  (ribs)    Time: 1435-1450 PT Time Calculation (min) (ACUTE ONLY): 15 min   Charges:   PT Evaluation $PT Eval Low Complexity: 1 Low          Erick Blinks, SPT  Erick Blinks 09/03/2018, 5:12 PM

## 2018-09-03 NOTE — Progress Notes (Signed)
Inpatient Diabetes Program Recommendations  AACE/ADA: New Consensus Statement on Inpatient Glycemic Control (2015)  Target Ranges:  Prepandial:   less than 140 mg/dL      Peak postprandial:   less than 180 mg/dL (1-2 hours)      Critically ill patients:  140 - 180 mg/dL   Lab Results  Component Value Date   GLUCAP 182 (H) 09/03/2018   HGBA1C 7.1 (H) 04/16/2018    Review of Glycemic Control  Diabetes history: DM2 Outpatient Diabetes medications: Humalog 75/25 26 units in am and 24 units before dinner, glipizide 10 mg QAM, metformin 500 mg bid Current orders for Inpatient glycemic control: Novolog 0-15 units tidwc and hs + 4 units tidwc, glipizide 10 mg QAM  Need updated HgbA1C. Last one 04/16/2018 - 7.1% Needs basal insulin.  Inpatient Diabetes Program Recommendations:     70/30 14 units bid. D/C Novolog 4 units tidwc  Titrate 70/30 until CBGs < 180 mg/dL.  Will follow.   Thank you. Lorenda Peck, RD, LDN, CDE Inpatient Diabetes Coordinator [phone]

## 2018-09-03 NOTE — Plan of Care (Signed)

## 2018-09-04 LAB — BASIC METABOLIC PANEL
Anion gap: 8 (ref 5–15)
BUN: 57 mg/dL — AB (ref 6–20)
CO2: 24 mmol/L (ref 22–32)
Calcium: 8.4 mg/dL — ABNORMAL LOW (ref 8.9–10.3)
Chloride: 104 mmol/L (ref 98–111)
Creatinine, Ser: 2.87 mg/dL — ABNORMAL HIGH (ref 0.61–1.24)
GFR calc Af Amer: 28 mL/min — ABNORMAL LOW (ref >60)
GFR calc non Af Amer: 24 mL/min — ABNORMAL LOW (ref >60)
Glucose, Bld: 167 mg/dL — ABNORMAL HIGH (ref 70–99)
Potassium: 3.9 mmol/L (ref 3.5–5.1)
SODIUM: 136 mmol/L (ref 135–145)

## 2018-09-04 LAB — GLUCOSE, CAPILLARY
Glucose-Capillary: 145 mg/dL — ABNORMAL HIGH (ref 70–99)
Glucose-Capillary: 135 mg/dL — ABNORMAL HIGH (ref 70–99)
Glucose-Capillary: 186 mg/dL — ABNORMAL HIGH (ref 70–99)
Glucose-Capillary: 138 mg/dL — ABNORMAL HIGH (ref 70–99)
Glucose-Capillary: 146 mg/dL — ABNORMAL HIGH (ref 70–99)

## 2018-09-04 MED ORDER — GUAIFENESIN 100 MG/5ML PO SOLN
5.0000 mL | ORAL | Status: DC | PRN
  Administered 2018-09-04 (×2): 100 mg via ORAL
  Filled 2018-09-04 (×3): qty 5

## 2018-09-04 NOTE — Progress Notes (Signed)
Spoke with Grandville Silos, MD and he stated it is coming from pts multiple rib fractures gave verbal order to Robitussin and EKG being done on pt. Will give pain medication to ease pain

## 2018-09-04 NOTE — Progress Notes (Signed)
Pt stated that he is having some chest pain going from his left ribs and moving upward. Stated that his throat hurts as well and when he is trying to clear is throat and nose it hurts the most. He stated this is new pain.  Trauma PA on call paged awaiting call back

## 2018-09-04 NOTE — Progress Notes (Signed)
Patient ID: Willie Jordan, male   DOB: [birth year], 53 y.o.   MRN: [id number]     Subjective: No new complaints.  Still with significant left chest pain but controlled with IV and oral medications.  Able to get up and walk around his room with some difficulty.  No shortness of breath.  Objective: Vital signs in last 24 hours: Temp:  [97.9 F (36.6 C)-98.6 F (37 C)] 97.9 F (36.6 C) (02/29 0415) Pulse Rate:  [55-62] 62 (02/29 0415) Resp:  [14-17] 16 (02/29 0415) BP: (124-149)/(67-75) 145/75 (02/29 0415) SpO2:  [91 %-93 %] 93 % (02/29 0415) Last BM Date: 09/01/18  Intake/Output from previous day: 02/28 0701 - 02/29 0700 In: 480 [P.O.:480] Out: 1150 [Urine:1150] Intake/Output this shift: No intake/output data recorded.  General appearance: alert, cooperative and mild distress Resp: clear to auscultation bilaterally Chest wall: Moderate expected chest wall tenderness without crepitance  Lab Results:  Recent Labs    09/02/18 1510 09/03/18 0857  WBC 12.3* 8.8  HGB 10.8* 10.5*  HCT 34.8* 33.0*  PLT 176 177   BMET Recent Labs    09/03/18 0857 09/04/18 0244  NA 136 136  K 3.6 3.9  CL 104 104  CO2 24 24  GLUCOSE 197* 167*  BUN 54* 57*  CREATININE 2.15* 2.87*  CALCIUM 8.1* 8.4*   CBG (last 3)  Recent Labs    09/03/18 1812 09/03/18 2150 09/04/18 0622  GLUCAP 199* 162* 145*     Studies/Results: Dg Chest 2 View  Result Date: 09/03/2018 CLINICAL DATA:  53 year old male status post fall with multiple left-sided rib fractures demonstrated by CT yesterday. EXAM: CHEST - 2 VIEW COMPARISON:  Chest CTA and radiographs 09/02/2018 and earlier. FINDINGS: Upright AP and lateral views of the chest. Posterior and lateral multilevel left rib fractures were better demonstrated by CT. Lung volumes remain low with increased left greater than right patchy opacity since yesterday. No pneumothorax, pulmonary edema, or pleural effusion identified. Stable cardiac size and mediastinal  contours. Visualized tracheal air column is within normal limits. Negative visible bowel gas pattern. IMPRESSION: 1. Continued low lung volumes with increased left greater than right atelectasis since yesterday. No pneumothorax or pleural effusion. 2. Multiple posterior and lateral left rib fractures better demonstrated by CT. Electronically Signed   By: Genevie Ann M.D.   On: 09/03/2018 11:27   Dg Ribs Unilateral W/chest Left  Result Date: 09/02/2018 CLINICAL DATA:  Pain following fall EXAM: LEFT RIBS AND CHEST - 3+ VIEW COMPARISON:  Chest radiograph July 26, 2017 FINDINGS: Frontal chest as well as oblique and cone-down rib images were obtained. There is no appreciable edema or consolidation. There is slight atelectatic change in the bases. Heart size and pulmonary vascularity are within normal limits. No adenopathy. There are fractures of the anterolateral left third, fourth, fifth, sixth, seventh, and eighth ribs with fracture fragments in overall near anatomic alignment. There is no evident pneumothorax or appreciable pleural effusion. IMPRESSION: Multiple rib fractures on the left without pneumothorax or pleural effusion. There is left base atelectasis. There is also slight right base atelectasis. No consolidation. Heart size normal. Electronically Signed   By: Lowella Grip III M.D.   On: 09/02/2018 14:12   Dg Elbow Complete Left  Result Date: 09/02/2018 CLINICAL DATA:  Pain following fall EXAM: LEFT ELBOW - COMPLETE 3+ VIEW COMPARISON:  None. FINDINGS: Frontal, lateral, and bilateral oblique views were obtained. There is no appreciable fracture or dislocation. No evident joint effusion. There is no appreciable joint  space narrowing or erosion. There is calcific tendinosis laterally near the distal humeral metaphysis. IMPRESSION: Calcific tendinosis laterally. No appreciable joint space narrowing or erosion. No evident fracture or dislocation. Electronically Signed   By: Lowella Grip III M.D.    On: 09/02/2018 14:13   Ct Chest W Contrast  Result Date: 09/02/2018 CLINICAL DATA:  Multiple rib fractures.  Status post fall. EXAM: CT CHEST WITH CONTRAST TECHNIQUE: Multidetector CT imaging of the chest was performed during intravenous contrast administration. CONTRAST:  57mL OMNIPAQUE IOHEXOL 300 MG/ML  SOLN COMPARISON:  Current chest and rib radiographs and prior chest radiographs. FINDINGS: Cardiovascular: Heart is top-normal in size. No pericardial effusion. Mild three-vessel coronary artery calcifications. Mild aortic atherosclerotic calcifications. Great vessels are normal in caliber. Mediastinum/Nodes: No neck base or axillary masses or enlarged lymph nodes. There are several prominent mediastinal lymph nodes, largest a right paratracheal node, 12 mm in short axis. No mediastinal or hilar masses. No hilar lymphadenopathy. Trachea and esophagus are unremarkable. Lungs/Pleura: No pneumothorax. No pleural effusion and/or hemothorax. There is linear and patchy opacity in the inferior, posterolateral left upper lobe, lingula and adjacent lateral left lower lobe, consistent with atelectasis. There is mild posterior subsegmental atelectasis at the base of the left lower lobe, minimally seen on the right as well. Remainder of the lungs is clear. Upper Abdomen: No acute findings. No evidence of injury on this unenhanced study. Musculoskeletal: There are multiple left-sided rib fractures, nondisplaced. There are bilateral fractures of the left third, fourth, fifth and sixth ribs and likely the seventh rib. There are posterior fractures of the left fourth through ninth ribs. No other fractures.  No bone lesions. IMPRESSION: 1. Multiple left-sided rib fractures as described above, nondisplaced, with no fracture complication. Specifically, no evidence of a lung laceration, pneumothorax or hemothorax. 2. There is mild atelectasis in the left lateral lower lung adjacent to rib fractures. 3. No other acute finding  within the chest. 4. Aortic atherosclerosis. Aortic Atherosclerosis (ICD10-I70.0). Electronically Signed   By: Lajean Manes M.D.   On: 09/02/2018 17:38    Anti-infectives: Anti-infectives (From admission, onward)   None      Assessment/Plan: Fall from ladder with multiple left rib fractures Diabetes Pain control adequate.  No evidence of complications.  Continue efforts at pulmonary toilet.  Not yet ready for discharge.    LOS: 1 day    Edward Jolly 09/04/2018

## 2018-09-04 NOTE — Plan of Care (Signed)
  Problem: Clinical Measurements: Goal: Cardiovascular complication will be avoided Outcome: Progressing   Problem: Activity: Goal: Risk for activity intolerance will decrease Outcome: Progressing   Problem: Pain Managment: Goal: General experience of comfort will improve Outcome: Progressing   Problem: Safety: Goal: Ability to remain free from injury will improve Outcome: Progressing   Problem: Skin Integrity: Goal: Risk for impaired skin integrity will decrease Outcome: Progressing

## 2018-09-04 NOTE — Evaluation (Signed)
Occupational Therapy Evaluation Patient Details Name: Willie Jordan MRN: [id number] DOB: October 31, 1965 Today's Date: 09/04/2018    History of Present Illness Patient is 53 y/o male admitted to hosptial following a fall from a ladder. Patient with multiple posterior and lateral L rib fxs (3-9). Elbow x-ray negative. PMH includes HTN, DMII, HLD, and peripheral neuropathy.    Clinical Impression   Pt XYI:[id number] alone, working. Pt currently limited by severe rib pain- premedicated prior to session. Pt performing transfers with minguardA and ADL functional mobility with no AD and minguardA. Pt performing ADLs with hip kit provided with good carryover skills after demo. Pt performing LUE sling donning/doffing with independence. Pt aware of deficits and mostly limited by pain. OT to continue to address tub shower transfer and AE for later sessions. OT to follow acutely.    Follow Up Recommendations  No OT follow up;Supervision - Intermittent    Equipment Recommendations  3 in 1 bedside commode(pt may deny need)    Recommendations for Other Services       Precautions / Restrictions Precautions Precautions: Fall Required Braces or Orthoses: Sling(for comfort; no order. ) Restrictions Weight Bearing Restrictions: No      Mobility Bed Mobility               General bed mobility comments: in recliner upon arrival  Transfers Overall transfer level: Needs assistance Equipment used: 1 person hand held assist Transfers: Sit to/from Stand Sit to Stand: Min guard         General transfer comment: Patient required [place] and min guard to stand.     Balance Overall balance assessment: Needs assistance Sitting-balance support: No upper extremity supported;Feet supported Sitting balance-Leahy Scale: Good     Standing balance support: No upper extremity supported Standing balance-Leahy Scale: Good Standing balance comment: No LOB in standing                           ADL  either performed or assessed with clinical judgement   ADL Overall ADL's : Needs assistance/impaired Eating/Feeding: Set up;Sitting   Grooming: Set up;Sitting   Upper Body Bathing: Minimal assistance;Sitting   Lower Body Bathing: Set up;Sitting/lateral leans;Sit to/from stand;With adaptive equipment   Upper Body Dressing : Set up;Sitting   Lower Body Dressing: Set up;With adaptive equipment;Sitting/lateral leans;Sit to/from stand   Toilet Transfer: Supervision/safety;Comfort height toilet   Toileting- Clothing Manipulation and Hygiene: Set up;With adaptive equipment;Sitting/lateral lean;Sit to/from stand       Functional mobility during ADLs: Min guard General ADL Comments: Pt education pn hip kit and performed LB dressing after demo with Modified independence. pt tolerating sesion well with pain in ribs significant. pt donned sling for LUE with no assist. pt able to get on/off commode with no rails with increased time. Pt denied need for 3in1, but OTR will advise to bring home as pt lives alone. Hip kit issued.     Vision Baseline Vision/History: Wears glasses(reading) Wears Glasses: Reading only Vision Assessment?: No apparent visual deficits     Perception     Praxis      Pertinent Vitals/Pain Pain Location: L ribs Pain Descriptors / Indicators: Aching     Hand Dominance     Extremity/Trunk Assessment Upper Extremity Assessment Upper Extremity Assessment: LUE deficits/detail LUE Deficits / Details: elbow is sore- no fx present; in sling for comfort.       Cervical / Trunk Assessment Cervical / Trunk Assessment: Other exceptions(L ribs fxs)  Communication Communication Communication: No difficulties   Cognition Arousal/Alertness: Awake/alert Behavior During Therapy: WFL for tasks assessed/performed Overall Cognitive Status: Within Functional Limits for tasks assessed                                     General Comments  No family in  room.    Exercises     Shoulder Instructions      Home Living Family/patient expects to be discharged to:: Private residence Living Arrangements: Alone Available Help at Discharge: Other (Comment)(no assistance at home) Type of Home: House Home Access: Stairs to enter [employer] of Steps: 4 Entrance Stairs-Rails: Left Home Layout: One level     Bathroom Shower/Tub: Teacher, early years/pre: Standard     Home Equipment: Environmental consultant - 2 wheels          Prior Functioning/Environment Level of Independence: Independent                 OT Problem List: Decreased safety awareness;Increased edema;Pain;Impaired UE functional use      OT Treatment/Interventions:      OT Goals(Current goals can be found in the care plan section) Acute Rehab OT Goals Patient Stated Goal: "have less pain"  OT Frequency:     Barriers to D/C:            Co-evaluation              AM-PAC OT "6 Clicks" Daily Activity     Outcome Measure Help from another person eating meals?: None Help from another person taking care of personal grooming?: None Help from another person toileting, which includes using toliet, bedpan, or urinal?: None Help from another person bathing (including washing, rinsing, drying)?: A Little Help from another person to put on and taking off regular upper body clothing?: A Little Help from another person to put on and taking off regular lower body clothing?: A Little 6 Click Score: 21   End of Session Equipment Utilized During Treatment: Gait belt Nurse Communication: Mobility status  Activity Tolerance: Patient limited by pain;Patient tolerated treatment well Patient left: in chair;with call bell/phone within reach;with chair alarm set  OT Visit Diagnosis: Unsteadiness on feet (R26.81);Muscle weakness (generalized) (M62.81)                Time: 0100-7121 OT Time Calculation (min): 34 min Charges:  OT General Charges $OT Visit: 1  Visit OT Evaluation $OT Eval Moderate Complexity: 1 Mod OT Treatments $Self Care/Home Management : 8-22 mins  Ebony Hail Harold Hedge) Marsa Aris OTR/L Acute Rehabilitation Services Pager: [phone] Office: [phone]   Fredda Hammed 09/04/2018, 2:44 PM

## 2018-09-05 ENCOUNTER — Inpatient Hospital Stay (HOSPITAL_COMMUNITY): Payer: Self-pay

## 2018-09-05 LAB — BASIC METABOLIC PANEL
Anion gap: 8 (ref 5–15)
BUN: 55 mg/dL — ABNORMAL HIGH (ref 6–20)
CO2: 24 mmol/L (ref 22–32)
Calcium: 8.4 mg/dL — ABNORMAL LOW (ref 8.9–10.3)
Chloride: 103 mmol/L (ref 98–111)
Creatinine, Ser: 2.84 mg/dL — ABNORMAL HIGH (ref 0.61–1.24)
GFR calc Af Amer: 29 mL/min — ABNORMAL LOW (ref >60)
GFR calc non Af Amer: 25 mL/min — ABNORMAL LOW (ref >60)
Glucose, Bld: 217 mg/dL — ABNORMAL HIGH (ref 70–99)
Potassium: 3.6 mmol/L (ref 3.5–5.1)
Sodium: 135 mmol/L (ref 135–145)

## 2018-09-05 LAB — GLUCOSE, CAPILLARY
GLUCOSE-CAPILLARY: 184 mg/dL — AB (ref 70–99)
Glucose-Capillary: 227 mg/dL — ABNORMAL HIGH (ref 70–99)
Glucose-Capillary: 283 mg/dL — ABNORMAL HIGH (ref 70–99)
Glucose-Capillary: 231 mg/dL — ABNORMAL HIGH (ref 70–99)

## 2018-09-05 LAB — CBC
HCT: 34.3 % — ABNORMAL LOW (ref 39.0–52.0)
Hemoglobin: 11.2 g/dL — ABNORMAL LOW (ref 13.0–17.0)
MCH: 26.5 pg (ref 26.0–34.0)
MCHC: 32.7 g/dL (ref 30.0–36.0)
MCV: 81.3 fL (ref 80.0–100.0)
Platelets: 189 10*3/uL (ref 150–400)
RBC: 4.22 MIL/uL (ref 4.22–5.81)
RDW: 12.9 % (ref 11.5–15.5)
WBC: 9.4 10*3/uL (ref 4.0–10.5)
nRBC: 0 % (ref 0.0–0.2)

## 2018-09-05 MED ORDER — IPRATROPIUM-ALBUTEROL 0.5-2.5 (3) MG/3ML IN SOLN
3.0000 mL | Freq: Four times a day (QID) | RESPIRATORY_TRACT | Status: DC
  Administered 2018-09-05: 3 mL via RESPIRATORY_TRACT
  Filled 2018-09-05 (×2): qty 3

## 2018-09-05 MED ORDER — OXYMETAZOLINE HCL 0.05 % NA SOLN
1.0000 | Freq: Two times a day (BID) | NASAL | Status: DC | PRN
  Administered 2018-09-05: 1 via NASAL
  Filled 2018-09-05: qty 30

## 2018-09-05 MED ORDER — SODIUM CHLORIDE 0.9 % IV BOLUS
1000.0000 mL | Freq: Once | INTRAVENOUS | Status: AC
  Administered 2018-09-05: 1000 mL via INTRAVENOUS

## 2018-09-05 MED ORDER — OXYMETAZOLINE HCL 0.05 % NA SOLN
1.0000 | Freq: Two times a day (BID) | NASAL | Status: DC
  Filled 2018-09-05: qty 30

## 2018-09-05 MED ORDER — OXYCODONE HCL 5 MG PO TABS
5.0000 mg | ORAL_TABLET | ORAL | Status: DC | PRN
  Administered 2018-09-05: 5 mg via ORAL
  Administered 2018-09-05 – 2018-09-10 (×7): 10 mg via ORAL
  Filled 2018-09-05 (×7): qty 2
  Filled 2018-09-05: qty 1
  Filled 2018-09-05: qty 2

## 2018-09-05 MED ORDER — IPRATROPIUM-ALBUTEROL 0.5-2.5 (3) MG/3ML IN SOLN
3.0000 mL | RESPIRATORY_TRACT | Status: DC | PRN
  Administered 2018-09-05: 3 mL via RESPIRATORY_TRACT
  Filled 2018-09-05: qty 3

## 2018-09-05 NOTE — Progress Notes (Signed)
    CC: FAll from ladder  Subjective: Pt O2 sats are down he continues to have pain and not really taking much for pain.  Reviewed with Dr. Excell Seltzer who saw him earlier.  Stat CXR ordered but not done  Objective: Vital signs in last 24 hours: Temp:  [98.2 F (36.8 C)-98.9 F (37.2 C)] 98.6 F (37 C) (03/01 0518) Pulse Rate:  [57-73] 68 (03/01 0906) Resp:  [14-16] 16 (03/01 0906) BP: (126-159)/(66-82) 159/82 (03/01 0906) SpO2:  [88 %-99 %] 99 % (03/01 0906) Last BM Date: 09/01/18 480 PO 1128 IV 450 urine recorded NO labs Creatinine was up yesterday Glucose 144-227 CXR 2/28: Continued low lung volumes with increased left greater than right atelectasis since yesterday. No pneumothorax or pleural effusion. 2. Multiple posterior and lateral left rib fractures better demonstrated by CT. Intake/Output from previous day: 02/29 0701 - 03/01 0700 In: 1608.3 [P.O.:480; I.V.:1128.3] Out: 450 [Urine:450] Intake/Output this shift: No intake/output data recorded.  General appearance: alert, cooperative and still very uncomfortable Resp: normal percussion bilaterally and rales on the left, right side is clear, moving about 1000 on IS.  still having alot of pain. GI: soft, non-tender; bowel sounds normal; no masses,  no organomegaly  Lab Results:  Recent Labs    09/02/18 1510 09/03/18 0857  WBC 12.3* 8.8  HGB 10.8* 10.5*  HCT 34.8* 33.0*  PLT 176 177    BMET Recent Labs    09/03/18 0857 09/04/18 0244  NA 136 136  K 3.6 3.9  CL 104 104  CO2 24 24  GLUCOSE 197* 167*  BUN 54* 57*  CREATININE 2.15* 2.87*  CALCIUM 8.1* 8.4*   PT/INR No results for input(s): LABPROT, INR in the last 72 hours.  No results for input(s): AST, ALT, ALKPHOS, BILITOT, PROT, ALBUMIN in the last 168 hours.   Lipase     Component Value Date/Time   LIPASE 150 (H) 05/24/2007 1515     Medications: . acetaminophen  1,000 mg Oral Q6H  . amLODipine  5 mg Oral Daily  . enoxaparin (LOVENOX)  injection  40 mg Subcutaneous Q24H  . furosemide  20 mg Oral Daily  . gabapentin  300 mg Oral TID  . glipiZIDE  10 mg Oral Q breakfast  . insulin aspart  0-15 Units Subcutaneous TID WC  . insulin aspart  0-5 Units Subcutaneous QHS  . insulin aspart  4 Units Subcutaneous TID WC  . ipratropium-albuterol  3 mL Nebulization Q6H  . lisinopril  20 mg Oral Daily  . methocarbamol  500 mg Oral TID  . metolazone  5 mg Oral Daily  . metoprolol tartrate  25 mg Oral BID  . rosuvastatin  20 mg Oral q1800   . 0.9 % NaCl with KCl 20 mEq / L 100 mL/hr at 09/05/18 0546    Assessment/Plan Fall from ladder L ribs 3-9 fractures (some segmental) - multimodal pain control, IS, pulmonary toilet HTN - home meds T2DM - ok to have glipizide and SSI, hold metformin 48h after contrasted scan yesterday Acute on chronic kidney disease - creatinine 2.19>>2.15>> 2.87 yesterday   FEN: CM diet, IVF VTE: SCDs, lovenox ID: no abx indicated at this time  Plan:  CXR is still pending.  Labs show the creatinine is still up and the glucose is also up.  I will increase the IV fluids, work on pain, I have encouraged him to use  LOS: 2 days    Dawnmarie Breon 09/05/2018 [phone]

## 2018-09-05 NOTE — Progress Notes (Signed)
Occupational Therapy Treatment Patient Details Name: Willie Jordan MRN: [id number] DOB: [birth year] Today's Date: 09/05/2018    History of present illness Patient is 53 y/o male admitted to [facility] following a fall from a ladder. Patient with multiple posterior and lateral L rib fxs (3-9). Elbow x-ray negative. PMH includes HTN, DMII, HLD, and peripheral neuropathy.    OT comments  Pt performing elbow, wrist and hand exercises for LUE. Sling repositioned as it was not supporting arm well. Pt's LUE swelling has reduced. Pt education on LB dressing with AE provided and pt too sore to perform- stating that he had been in the chair all morning. OTR described the importance of being mobile. Pt describes family can assist at home intermittently. Pt cotninues to require OT for ADL, mobility and safety In [place] setting. OT to follow acutely.    Follow Up Recommendations  No OT follow up;Supervision - Intermittent    Equipment Recommendations  3 in 1 bedside commode(if pt wants it)    Recommendations for Other Services      Precautions / Restrictions Precautions Precautions: Fall Required Braces or Orthoses: Sling(LUE) Restrictions Weight Bearing Restrictions: No       Mobility Bed Mobility Overal bed mobility: Needs Assistance             General bed mobility comments: limited bed mobility as pt was tired and in pain  Transfers Overall transfer level: Needs assistance Equipment used: 1 person hand held assist                  Balance                                           ADL either performed or assessed with clinical judgement   ADL Overall ADL's : Needs assistance/impaired                                     Functional mobility during ADLs: Min guard General ADL Comments: Pt requiring assist. Pt in too severe of pain to mobilize, but performed exercises.     Vision   Vision Assessment?: No apparent visual deficits    Perception     Praxis      Cognition Arousal/Alertness: Awake/alert Behavior During Therapy: WFL for tasks assessed/performed Overall Cognitive Status: Within Functional Limits for tasks assessed                                          Exercises Exercises: General Upper Extremity General Exercises - Upper Extremity Elbow Flexion: AAROM;10 reps;Supine Elbow Extension: AAROM;10 reps;Supine Wrist Flexion: AAROM;10 reps;Supine Wrist Extension: AAROM;10 reps;Supine Digit Composite Flexion: AAROM;10 reps;Supine   Shoulder Instructions       General Comments education performed about moving more is better that staying in bed or chair.    Pertinent Vitals/ Pain       Pain Assessment: Faces Faces Pain Scale: Hurts even more Pain Location: L ribs Pain Descriptors / Indicators: Joes  Prior Functioning/Environment              Frequency  Min 2X/week        Progress Toward Goals  OT Goals(current goals can now be found in the care plan section)  Progress towards OT goals: Progressing toward goals  Acute Rehab OT Goals Patient Stated Goal: "have less pain" ADL Goals Pt Will Perform Upper Body Dressing: with modified independence Pt Will Transfer to Toilet: with modified independence Pt Will Perform Toileting - Clothing Manipulation and hygiene: with modified independence Additional ADL Goal #1: Pt will perform ADLs with modified independence and AE as needed.  Plan Discharge plan remains appropriate    Co-evaluation                 AM-PAC OT "6 Clicks" Daily Activity     Outcome Measure   Help from another person eating meals?: None Help from another person taking care of personal grooming?: None Help from another person toileting, which includes using toliet, bedpan, or urinal?: None Help from another person bathing (including washing, rinsing, drying)?: A  Little Help from another person to put on and taking off regular upper body clothing?: A Little Help from another person to put on and taking off regular lower body clothing?: A Little 6 Click Score: 21    End of Session Equipment Utilized During Treatment: Other (comment)(LUE sling)  OT Visit Diagnosis: Unsteadiness on feet (R26.81);Muscle weakness (generalized) (M62.81)   Activity Tolerance Patient limited by pain;Patient tolerated treatment well   Patient Left in bed;with call bell/phone within reach   Nurse Communication Mobility status        Time: 1194-1740 OT Time Calculation (min): 11 min  Charges: OT General Charges $OT Visit: 1 Visit OT Treatments $Therapeutic Exercise: 8-22 mins  Darryl Nestle) Marsa Aris OTR/L Acute Rehabilitation Services Pager: [phone] Office: [phone]    Fredda Hammed 09/05/2018, 3:47 PM

## 2018-09-05 NOTE — Progress Notes (Signed)
Patient ID: Willie Jordan, male   DOB: 03-09-1966, 53 y.o.   MRN: [id number]     Subjective: Still with left rib pain but gradually improving.  Had transient low O2 sats of 88% earlier this morning.  Back up with pulmonary toilet and 4 L O2.  Objective: Vital signs in last 24 hours: Temp:  [98.2 F (36.8 C)-98.9 F (37.2 C)] 98.6 F (37 C) (03/01 0518) Pulse Rate:  [57-73] 68 (03/01 0906) Resp:  [14-16] 16 (03/01 0906) BP: (126-159)/(66-82) 159/82 (03/01 0906) SpO2:  [88 %-99 %] 99 % (03/01 0906) Last BM Date: 09/01/18  Intake/Output from previous day: 02/29 0701 - 03/01 0700 In: 1608.3 [P.O.:480; I.V.:1128.3] Out: 450 [Urine:450] Intake/Output this shift: No intake/output data recorded.  General appearance: alert, cooperative and mild distress Resp: Mild bilateral wheezing without increased work of breathing.  Lab Results:  Recent Labs    09/02/18 1510 09/03/18 0857  WBC 12.3* 8.8  HGB 10.8* 10.5*  HCT 34.8* 33.0*  PLT 176 177   BMET Recent Labs    09/03/18 0857 09/04/18 0244  NA 136 136  K 3.6 3.9  CL 104 104  CO2 24 24  GLUCOSE 197* 167*  BUN 54* 57*  CREATININE 2.15* 2.87*  CALCIUM 8.1* 8.4*     Studies/Results: No results found.  Anti-infectives: Anti-infectives (From admission, onward)   None      Assessment/Plan: To the left rib fractures.  Continue efforts at pain control and pulmonary toilet.  Patient lives alone and is not yet ready for discharge.    LOS: 2 days    Edward Jolly 09/05/2018

## 2018-09-05 NOTE — Progress Notes (Signed)
Temp 103.1.  Patient given tylenol, encourage use of incentive.  Pt. Is not moving very well because pain, oxycodone given.  To monitor.

## 2018-09-06 LAB — BASIC METABOLIC PANEL
Anion gap: 7 (ref 5–15)
BUN: 56 mg/dL — ABNORMAL HIGH (ref 6–20)
CO2: 23 mmol/L (ref 22–32)
Calcium: 7.7 mg/dL — ABNORMAL LOW (ref 8.9–10.3)
Chloride: 105 mmol/L (ref 98–111)
Creatinine, Ser: 3.24 mg/dL — ABNORMAL HIGH (ref 0.61–1.24)
GFR calc Af Amer: 24 mL/min — ABNORMAL LOW (ref >60)
GFR calc non Af Amer: 21 mL/min — ABNORMAL LOW (ref >60)
Glucose, Bld: 150 mg/dL — ABNORMAL HIGH (ref 70–99)
Potassium: 3.6 mmol/L (ref 3.5–5.1)
Sodium: 135 mmol/L (ref 135–145)

## 2018-09-06 LAB — CBC
HEMATOCRIT: 31.4 % — AB (ref 39.0–52.0)
Hemoglobin: 9.9 g/dL — ABNORMAL LOW (ref 13.0–17.0)
MCH: 25.8 pg — ABNORMAL LOW (ref 26.0–34.0)
MCHC: 31.5 g/dL (ref 30.0–36.0)
MCV: 81.8 fL (ref 80.0–100.0)
NRBC: 0 % (ref 0.0–0.2)
Platelets: 180 10*3/uL (ref 150–400)
RBC: 3.84 MIL/uL — ABNORMAL LOW (ref 4.22–5.81)
RDW: 13.1 % (ref 11.5–15.5)
WBC: 14.7 10*3/uL — AB (ref 4.0–10.5)

## 2018-09-06 LAB — GLUCOSE, CAPILLARY
Glucose-Capillary: 191 mg/dL — ABNORMAL HIGH (ref 70–99)
Glucose-Capillary: 234 mg/dL — ABNORMAL HIGH (ref 70–99)
Glucose-Capillary: 145 mg/dL — ABNORMAL HIGH (ref 70–99)
Glucose-Capillary: 117 mg/dL — ABNORMAL HIGH (ref 70–99)

## 2018-09-06 MED ORDER — METHOCARBAMOL 750 MG PO TABS
750.0000 mg | ORAL_TABLET | Freq: Three times a day (TID) | ORAL | Status: DC
  Administered 2018-09-06 – 2018-09-10 (×14): 750 mg via ORAL
  Filled 2018-09-06 (×14): qty 1

## 2018-09-06 MED ORDER — INSULIN ASPART 100 UNIT/ML ~~LOC~~ SOLN
4.0000 [IU] | Freq: Three times a day (TID) | SUBCUTANEOUS | Status: DC
  Administered 2018-09-06 (×2): 4 [IU] via SUBCUTANEOUS

## 2018-09-06 MED ORDER — INSULIN ASPART 100 UNIT/ML ~~LOC~~ SOLN
0.0000 [IU] | Freq: Three times a day (TID) | SUBCUTANEOUS | Status: DC
  Administered 2018-09-06: 7 [IU] via SUBCUTANEOUS
  Administered 2018-09-06: 3 [IU] via SUBCUTANEOUS
  Administered 2018-09-07: 4 [IU] via SUBCUTANEOUS
  Administered 2018-09-07: 3 [IU] via SUBCUTANEOUS
  Administered 2018-09-07 – 2018-09-08 (×2): 7 [IU] via SUBCUTANEOUS
  Administered 2018-09-08: 11 [IU] via SUBCUTANEOUS
  Administered 2018-09-09: 20 [IU] via SUBCUTANEOUS
  Administered 2018-09-09: 15 [IU] via SUBCUTANEOUS
  Administered 2018-09-09: 11 [IU] via SUBCUTANEOUS
  Administered 2018-09-10: 3 [IU] via SUBCUTANEOUS
  Administered 2018-09-10: 7 [IU] via SUBCUTANEOUS
  Administered 2018-09-10: 3 [IU] via SUBCUTANEOUS

## 2018-09-06 MED ORDER — INSULIN ASPART 100 UNIT/ML ~~LOC~~ SOLN
0.0000 [IU] | Freq: Every day | SUBCUTANEOUS | Status: DC
  Administered 2018-09-08: 2 [IU] via SUBCUTANEOUS

## 2018-09-06 MED ORDER — METFORMIN HCL ER 500 MG PO TB24: 500.0000 mg | ORAL_TABLET | Freq: Every day | ORAL | Status: DC

## 2018-09-06 MED ORDER — SODIUM CHLORIDE 0.9 % IV SOLN
1.0000 g | INTRAVENOUS | Status: DC
  Administered 2018-09-06 – 2018-09-09 (×4): 1 g via INTRAVENOUS
  Filled 2018-09-06 (×5): qty 1

## 2018-09-06 MED ORDER — GUAIFENESIN 100 MG/5ML PO SOLN
5.0000 mL | Freq: Two times a day (BID) | ORAL | Status: DC
  Administered 2018-09-06 – 2018-09-08 (×3): 100 mg via ORAL
  Filled 2018-09-06 (×10): qty 5

## 2018-09-06 MED ORDER — IPRATROPIUM-ALBUTEROL 0.5-2.5 (3) MG/3ML IN SOLN
3.0000 mL | Freq: Four times a day (QID) | RESPIRATORY_TRACT | Status: DC
  Administered 2018-09-06 – 2018-09-07 (×3): 3 mL via RESPIRATORY_TRACT
  Filled 2018-09-06 (×3): qty 3

## 2018-09-06 NOTE — Progress Notes (Signed)
Physical Therapy Treatment Patient Details Name: Willie Jordan MRN: [id number] DOB: [birth year] Today's Date: 09/06/2018    History of Present Illness Patient is 53 y/o male admitted to hosptial following a fall from a ladder. Patient with multiple posterior and lateral L rib fxs (3-9). Elbow x-ray negative. PMH includes HTN, DMII, HLD, and peripheral neuropathy.     PT Comments    Patient seen for mobility progression. Pt is making progress toward PT goals. Pt c/o rib and L UE pain but seems to be limited more by SpO2 desat with mobility vs pain level. Pt requires 6L O2 via [place] while mobilizing to bring SpO2 into high 70s and rest breaks with breathing technique to get back up to 80%. Pt on 5L upon arrival to room and SpO2 fluctuating from 87%-90% at rest. Continue to progress as tolerated.     Follow Up Recommendations  No PT follow up     Equipment Recommendations  None recommended by PT    Recommendations for Other Services       Precautions / Restrictions Precautions Precautions: Fall Required Braces or Orthoses: Sling(LUE) Restrictions Weight Bearing Restrictions: No    Mobility  Bed Mobility               General bed mobility comments: pt OOB in chair upon arrival  Transfers Overall transfer level: Needs assistance   Transfers: Sit to/from Stand Sit to Stand: Min guard         General transfer comment: min guard for safety; no physical assist needed  Ambulation/Gait Ambulation/Gait assistance: Min guard Gait Distance (Feet): 120 Feet Assistive device: None;IV Pole Gait Pattern/deviations: Step-through pattern;Decreased stride length Gait velocity: decreased   General Gait Details: cues for breathing technique; grossly steady gait; single UE on IV pole at times; no overt LOB but noted unsteadiness when fatigued and SpO2 desat into 60s without O2 and 70s with 6L O2 via [place]    Stairs             Wheelchair Mobility    Modified Rankin (Stroke  Patients Only)       Balance Overall balance assessment: Needs assistance Sitting-balance support: No upper extremity supported;Feet supported Sitting balance-Leahy Scale: Good     Standing balance support: No upper extremity supported Standing balance-Leahy Scale: Fair                              Cognition Arousal/Alertness: Awake/alert Behavior During Therapy: WFL for tasks assessed/performed Overall Cognitive Status: Within Functional Limits for tasks assessed                                        Exercises      General Comments        Pertinent Vitals/Pain Pain Assessment: Faces Faces Pain Scale: Hurts even more Pain Location: L ribs and L UE Pain Descriptors / Indicators: Aching;Grimacing;Guarding Pain Intervention(s): Limited activity within patient's tolerance;Monitored during session;Repositioned    Home Living                      Prior Function            PT Goals (current goals can now be found in the care plan section) Progress towards PT goals: Progressing toward goals    Frequency    Min 3X/week  PT Plan Current plan remains appropriate    Co-evaluation              AM-PAC PT "6 Clicks" Mobility   Outcome Measure  Help needed turning from your back to your side while in a flat bed without using bedrails?: None Help needed moving from lying on your back to sitting on the side of a flat bed without using bedrails?: None Help needed moving to and from a bed to a chair (including a wheelchair)?: None Help needed standing up from a chair using your arms (e.g., wheelchair or bedside chair)?: A Little Help needed to walk in hospital room?: A Little Help needed climbing 3-5 steps with a railing? : A Little 6 Click Score: 21    End of Session Equipment Utilized During Treatment: Oxygen;Other (comment)(L UE sling) Activity Tolerance: Patient tolerated treatment well;Other (comment)(O2  desat) Patient left: in chair;with call bell/phone within reach;with chair alarm set Nurse Communication: Mobility status PT Visit Diagnosis: Pain;Other abnormalities of gait and mobility (R26.89) Pain - Right/Left: Left Pain - part of body: (ribs)     Time: 4239-5320 PT Time Calculation (min) (ACUTE ONLY): 19 min  Charges:  $Gait Training: 8-22 mins                     Earney Navy, PTA Acute Rehabilitation Services Pager: [phone] Office: [phone]     Darliss Cheney 09/06/2018, 10:20 AM

## 2018-09-06 NOTE — Progress Notes (Signed)
Inpatient Diabetes Program Recommendations  AACE/ADA: New Consensus Statement on Inpatient Glycemic Control (2015)  Target Ranges:  Prepandial:   less than 140 mg/dL      Peak postprandial:   less than 180 mg/dL (1-2 hours)      Critically ill patients:  140 - 180 mg/dL   Lab Results  Component Value Date   GLUCAP 191 (H) 09/06/2018   HGBA1C 7.1 (H) 04/16/2018    Review of Glycemic Control Results for CYLAS, FALZONE (MRN [id number]) as of 09/06/2018 13:37  Ref. Range 09/05/2018 17:26 09/05/2018 21:52 09/06/2018 07:58 09/06/2018 12:00  Glucose-Capillary Latest Ref Range: 70 - 99 mg/dL 231 (H) 184 (H) 191 (H) 234 (H)   Diabetes history: DM2 Outpatient Diabetes medications: Humalog 75/25 26 units in am and 24 units before dinner, glipizide 10 mg QAM, metformin 500 mg bid Current orders for Inpatient glycemic control: Novolog 0-20 units tidwc and hs + 4 units tidwc, glipizide 10 mg QAM  Need updated HgbA1C. Last one 04/16/2018 - 7.1% Needs basal insulin.  Inpatient Diabetes Program Recommendations:     70/30 14 units bid. D/C Novolog 4 units tidwc  Titrate 70/30 until CBGs < 180 mg/dL   Thanks, Bronson Curb, MSN, RNC-OB Diabetes Coordinator [phone] (8a-5p)

## 2018-09-06 NOTE — Plan of Care (Signed)

## 2018-09-06 NOTE — Plan of Care (Signed)

## 2018-09-06 NOTE — Care Management Note (Signed)
Case Management Note  Patient Details  Name: Lawrence Mitch MRN: [id number] Date of Birth: 04-Apr-1966  Subjective/Objective:    Patient is 53 y/o male admitted to hosptial following a fall from a ladder. Patient with multiple posterior and lateral L rib fxs (3-9).  PTA, pt independent, lives at home alone.                 Action/Plan: PT/OT recommending no OP follow up.  PCP is [facility].    Expected Discharge Date:                  Expected Discharge Plan:  Home/Self Care  In-House Referral:  Clinical Social Work  Discharge planning Services  CM Consult  Post Acute Care Choice:    Choice offered to:     DME Arranged:    DME Agency:     HH Arranged:    HH Agency:     Status of Service:  In process, will continue to follow  If discussed at Long Length of Stay Meetings, dates discussed:    Additional Comments:  Reinaldo Raddle, RN, BSN  Trauma/Neuro ICU Case Manager [phone]

## 2018-09-06 NOTE — Progress Notes (Signed)
[facility] Progress Note     Subjective: CC: pain and SOB Patient reports no pain at rest but pain in left ribs with movement. Having some increased SOB this AM, on nasal cannula. Febrile to 103 overnight. Denies abdominal pain, nausea, vomiting, urinary symptoms.   Objective: Vital signs in last 24 hours: Temp:  [98.4 F (36.9 C)-103.1 F (39.5 C)] 98.4 F (36.9 C) (03/02 0423) Pulse Rate:  [68-88] 75 (03/02 0423) Resp:  [14-22] 14 (03/01 2036) BP: (128-179)/(58-82) 128/58 (03/02 0423) SpO2:  [88 %-99 %] 90 % (03/02 0423) Weight:  [84 kg] 84 kg (03/02 0416) Last BM Date: 09/01/18  Intake/Output from previous day: 03/01 0701 - 03/02 0700 In: 720 [P.O.:720] Out: 1900 [Urine:1900] Intake/Output this shift: No intake/output data recorded.  PE: Gen:  Alert, NAD, pleasant Card:  Regular rate and rhythm, mild edema in bilateral LEs Pulm:  Slightly tachypnic, on 4L via nasal cannula, rhonci in mid anterior left lung field, pulled 750 on IS Abd: Soft, non-tender, non-distended, +BS Skin: warm and dry, no rashes  Psych: A&Ox3   Lab Results:  Recent Labs    09/03/18 0857 09/05/18 1021  WBC 8.8 9.4  HGB 10.5* 11.2*  HCT 33.0* 34.3*  PLT 177 189   BMET Recent Labs    09/04/18 0244 09/05/18 1021  NA 136 135  K 3.9 3.6  CL 104 103  CO2 24 24  GLUCOSE 167* 217*  BUN 57* 55*  CREATININE 2.87* 2.84*  CALCIUM 8.4* 8.4*   PT/INR No results for input(s): LABPROT, INR in the last 72 hours. CMP     Component Value Date/Time   NA 135 09/05/2018 1021   NA 137 04/19/2018 1256   K 3.6 09/05/2018 1021   CL 103 09/05/2018 1021   CO2 24 09/05/2018 1021   GLUCOSE 217 (H) 09/05/2018 1021   BUN 55 (H) 09/05/2018 1021   BUN 34 (H) 04/19/2018 1256   CREATININE 2.84 (H) 09/05/2018 1021   CALCIUM 8.4 (L) 09/05/2018 1021   PROT 6.0 04/16/2018 1114   ALBUMIN 2.8 (L) 04/16/2018 1114   AST 22 04/16/2018 1114   ALT 24 04/16/2018 1114   ALKPHOS 117 04/16/2018 1114    BILITOT <0.2 04/16/2018 1114   GFRNONAA 25 (L) 09/05/2018 1021   GFRAA 29 (L) 09/05/2018 1021   Lipase     Component Value Date/Time   LIPASE 150 (H) 05/24/2007 1515       Studies/Results: Dg Chest 2 View  Result Date: 09/05/2018 CLINICAL DATA:  Shortness of breath following fall from ladder. EXAM: CHEST - 2 VIEW COMPARISON:  09/03/2018 chest radiograph, 09/02/2018 chest CT and prior studies FINDINGS: Cardiomegaly and mild bibasilar atelectasis, LEFT-greater-than-RIGHT, again noted. No new pulmonary opacities are present. No evidence of pneumothorax or large pleural effusion. Several LEFT rib fractures are again identified. IMPRESSION: No significant change from 09/03/2018. Bibasilar atelectasis and LEFT rib fractures again noted without pneumothorax. Electronically Signed   By: Margarette Canada M.D.   On: 09/05/2018 13:05    Anti-infectives: Anti-infectives (From admission, onward)   None       Assessment/Plan Fall from ladder L ribs 3-9 fractures (some segmental) - multimodal pain control, IS, pulmonary toilet - flutter valve - BID guaifenesin  HTN - home meds T2DM - home meds AKI on CKD - Cr 2.84 yesterday, repeat BMET today  Fever - Tmax 103.1 overnight, check CBC  FEN: CM diet, IVF VTE: SCDs, lovenox ID: no abx indicated at this time  Dispo:  Wean O2 as able. Check labs. Pain control   LOS: 3 days    Brigid Re , [facility] 09/06/2018, 8:44 AM Pager: [phone]

## 2018-09-07 ENCOUNTER — Inpatient Hospital Stay (HOSPITAL_COMMUNITY): Payer: Self-pay

## 2018-09-07 DIAGNOSIS — S2242XA Multiple fractures of ribs, left side, initial encounter for closed fracture: Principal | ICD-10-CM

## 2018-09-07 DIAGNOSIS — D631 Anemia in chronic kidney disease: Secondary | ICD-10-CM | POA: Diagnosis present

## 2018-09-07 DIAGNOSIS — N189 Chronic kidney disease, unspecified: Secondary | ICD-10-CM

## 2018-09-07 LAB — CBC
HCT: 26.7 % — ABNORMAL LOW (ref 39.0–52.0)
Hemoglobin: 8.6 g/dL — ABNORMAL LOW (ref 13.0–17.0)
MCH: 26.5 pg (ref 26.0–34.0)
MCHC: 32.2 g/dL (ref 30.0–36.0)
MCV: 82.4 fL (ref 80.0–100.0)
PLATELETS: 138 10*3/uL — AB (ref 150–400)
RBC: 3.24 MIL/uL — ABNORMAL LOW (ref 4.22–5.81)
RDW: 13.3 % (ref 11.5–15.5)
WBC: 11.1 10*3/uL — ABNORMAL HIGH (ref 4.0–10.5)
nRBC: 0 % (ref 0.0–0.2)

## 2018-09-07 LAB — BASIC METABOLIC PANEL
Anion gap: 11 (ref 5–15)
Anion gap: 9 (ref 5–15)
BUN: 54 mg/dL — ABNORMAL HIGH (ref 6–20)
BUN: 57 mg/dL — ABNORMAL HIGH (ref 6–20)
CALCIUM: 7.8 mg/dL — AB (ref 8.9–10.3)
CO2: 21 mmol/L — ABNORMAL LOW (ref 22–32)
CO2: 23 mmol/L (ref 22–32)
Calcium: 8.1 mg/dL — ABNORMAL LOW (ref 8.9–10.3)
Chloride: 103 mmol/L (ref 98–111)
Chloride: 100 mmol/L (ref 98–111)
Creatinine, Ser: 3.19 mg/dL — ABNORMAL HIGH (ref 0.61–1.24)
Creatinine, Ser: 3.31 mg/dL — ABNORMAL HIGH (ref 0.61–1.24)
GFR calc Af Amer: 25 mL/min — ABNORMAL LOW (ref >60)
GFR calc Af Amer: 24 mL/min — ABNORMAL LOW (ref >60)
GFR calc non Af Amer: 21 mL/min — ABNORMAL LOW (ref >60)
GFR, EST NON AFRICAN AMERICAN: 21 mL/min — AB (ref >60)
Glucose, Bld: 134 mg/dL — ABNORMAL HIGH (ref 70–99)
Glucose, Bld: 210 mg/dL — ABNORMAL HIGH (ref 70–99)
Potassium: 3.6 mmol/L (ref 3.5–5.1)
Potassium: 3.5 mmol/L (ref 3.5–5.1)
SODIUM: 132 mmol/L — AB (ref 135–145)
Sodium: 135 mmol/L (ref 135–145)

## 2018-09-07 LAB — GLUCOSE, CAPILLARY
Glucose-Capillary: 128 mg/dL — ABNORMAL HIGH (ref 70–99)
Glucose-Capillary: 225 mg/dL — ABNORMAL HIGH (ref 70–99)
Glucose-Capillary: 194 mg/dL — ABNORMAL HIGH (ref 70–99)
Glucose-Capillary: 195 mg/dL — ABNORMAL HIGH (ref 70–99)
Glucose-Capillary: 124 mg/dL — ABNORMAL HIGH (ref 70–99)

## 2018-09-07 LAB — PROCALCITONIN: Procalcitonin: 2.02 ng/mL

## 2018-09-07 MED ORDER — INSULIN ASPART PROT & ASPART (70-30 MIX) 100 UNIT/ML ~~LOC~~ SUSP
14.0000 [IU] | Freq: Two times a day (BID) | SUBCUTANEOUS | Status: DC
  Administered 2018-09-07 – 2018-09-09 (×4): 14 [IU] via SUBCUTANEOUS
  Filled 2018-09-07 (×3): qty 10

## 2018-09-07 MED ORDER — ALBUTEROL SULFATE (2.5 MG/3ML) 0.083% IN NEBU: 2.5000 mg | INHALATION_SOLUTION | RESPIRATORY_TRACT | Status: DC | PRN

## 2018-09-07 MED ORDER — LIDOCAINE 5 % EX PTCH
1.0000 | MEDICATED_PATCH | CUTANEOUS | Status: DC
  Administered 2018-09-07 – 2018-09-09 (×3): 1 via TRANSDERMAL
  Filled 2018-09-07 (×4): qty 1

## 2018-09-07 MED ORDER — FUROSEMIDE 10 MG/ML IJ SOLN
60.0000 mg | Freq: Once | INTRAMUSCULAR | Status: AC
  Administered 2018-09-07: 60 mg via INTRAVENOUS
  Filled 2018-09-07: qty 6

## 2018-09-07 MED ORDER — HEPARIN SODIUM (PORCINE) 5000 UNIT/ML IJ SOLN
5000.0000 [IU] | Freq: Three times a day (TID) | INTRAMUSCULAR | Status: DC
  Administered 2018-09-07 – 2018-09-09 (×6): 5000 [IU] via SUBCUTANEOUS
  Filled 2018-09-07 (×7): qty 1

## 2018-09-07 MED ORDER — IPRATROPIUM-ALBUTEROL 0.5-2.5 (3) MG/3ML IN SOLN
3.0000 mL | Freq: Three times a day (TID) | RESPIRATORY_TRACT | Status: DC
  Administered 2018-09-07 – 2018-09-08 (×5): 3 mL via RESPIRATORY_TRACT
  Filled 2018-09-07 (×5): qty 3

## 2018-09-07 NOTE — Plan of Care (Signed)
  Problem: Education: Goal: Knowledge of General Education information will improve Description: Including pain rating scale, medication(s)/side effects and non-pharmacologic comfort measures Outcome: Progressing   Problem: Activity: Goal: Risk for activity intolerance will decrease Outcome: Progressing   Problem: Coping: Goal: Level of anxiety will decrease Outcome: Progressing   Problem: Elimination: Goal: Will not experience complications related to bowel motility Outcome: Progressing Goal: Will not experience complications related to urinary retention Outcome: Progressing   Problem: Pain Managment: Goal: General experience of comfort will improve Outcome: Progressing   

## 2018-09-07 NOTE — Progress Notes (Signed)
Occupational Therapy Treatment Patient Details Name: Willie Jordan MRN: [id number] DOB: 1965-08-31 Today's Date: 09/07/2018    History of present illness Patient is 53 y/o male admitted to [place] following a fall from a ladder. Patient with multiple posterior and lateral L rib fxs (3-9). Elbow x-ray negative. PMH includes HTN, DMII, HLD, and peripheral neuropathy.    OT comments  Pt presents seated in recliner and agreeable to OT treatment session. Pt continues to have limitations due to painful L side, on 3L O2 this session with lowest sat noted 85% with seated activity. Pt demonstrating improvements in ability to perform LB ADL without AD, requiring minA at this time. Educated pt in edema reduction techniques for edemous LUE and bil LEs with pt verbalizing and return demonstrating understanding. Feel POC remains appropriate at this time. Will continue to follow acutely to progress pt towards established OT goals.   Follow Up Recommendations  No OT follow up;Supervision - Intermittent    Equipment Recommendations  3 in 1 bedside commode          Precautions / Restrictions Precautions Precautions: Fall Required Braces or Orthoses: Sling(LUE) Restrictions Weight Bearing Restrictions: No       Mobility Bed Mobility               General bed mobility comments: pt OOB in chair upon arrival  Transfers                      Balance Overall balance assessment: Needs assistance Sitting-balance support: No upper extremity supported;Feet supported Sitting balance-Leahy Scale: Good     Standing balance support: No upper extremity supported Standing balance-Leahy Scale: Fair                             ADL either performed or assessed with clinical judgement   ADL Overall ADL's : Needs assistance/impaired                     Lower Body Dressing: Minimal assistance;Sit to/from stand Lower Body Dressing Details (indicate cue type and reason):  began to incorporate use of AE into LB ADL however pt demonstrates he is now able to perform figure 4 technique given increased time/effort; pt can don R sock but not able to don L sock in figure 4 due to increased pain with reaching towards foot; educated in one handed techqnique but pt declined practicing               General ADL Comments: completed chair level session per pt request due to pain with mobility; pt on 3L during session with lowest O2 sat noted 85% during seated activity; educated in edema reducation techniques as pt with edemous LUE and bil LEs; encouraged continued use of IS     Vision       Perception     Praxis      Cognition Arousal/Alertness: Awake/alert Behavior During Therapy: WFL for tasks assessed/performed Overall Cognitive Status: Within Functional Limits for tasks assessed                                          Exercises Exercises: General Upper Extremity;General Lower Extremity General Exercises - Upper Extremity Elbow Flexion: AROM;10 reps;Left;Seated Elbow Extension: AROM;10 reps;Left;Seated Wrist Flexion: AROM;10 reps;Left;Seated Wrist Extension: AROM;10 reps;Left;Seated Digit Composite Flexion: AROM;Left;10 reps;Seated  Composite Extension: AROM;10 reps;Seated;Left General Exercises - Lower Extremity Ankle Circles/Pumps: AROM;10 reps;Both;Seated   Shoulder Instructions       General Comments      Pertinent Vitals/ Pain       Pain Assessment: Faces Faces Pain Scale: Hurts little more Pain Location: L ribs and L UE with certain movements Pain Descriptors / Indicators: Aching;Grimacing;Guarding  Home Living                                          Prior Functioning/Environment              Frequency  Min 2X/week        Progress Toward Goals  OT Goals(current goals can now be found in the care plan section)  Progress towards OT goals: Progressing toward goals  Acute Rehab OT  Goals Patient Stated Goal: "have less pain" ADL Goals Pt Will Perform Upper Body Dressing: with modified independence Pt Will Transfer to Toilet: with modified independence Pt Will Perform Toileting - Clothing Manipulation and hygiene: with modified independence Additional ADL Goal #1: Pt will perform ADLs with modified independence and AE as needed.  Plan Discharge plan remains appropriate    Co-evaluation                 AM-PAC OT "6 Clicks" Daily Activity     Outcome Measure   Help from another person eating meals?: None Help from another person taking care of personal grooming?: None Help from another person toileting, which includes using toliet, bedpan, or urinal?: None Help from another person bathing (including washing, rinsing, drying)?: A Little Help from another person to put on and taking off regular upper body clothing?: A Little Help from another person to put on and taking off regular lower body clothing?: A Little 6 Click Score: 21    End of Session Equipment Utilized During Treatment: Oxygen  OT Visit Diagnosis: Unsteadiness on feet (R26.81);Muscle weakness (generalized) (M62.81)   Activity Tolerance Patient tolerated treatment well;Patient limited by pain   Patient Left in chair;with call bell/phone within reach   Nurse Communication Mobility status        Time: 1610-9604 OT Time Calculation (min): 18 min  Charges: OT General Charges $OT Visit: 1 Visit OT Treatments $Self Care/Home Management : 8-22 mins  Lou Cal, [place] Pager [phone] Office [phone]    Raymondo Band 09/07/2018, 4:57 PM

## 2018-09-07 NOTE — Plan of Care (Signed)

## 2018-09-07 NOTE — Consult Note (Signed)
Medical Consultation   Willie Jordan  VHQ:[id number]  DOB: 25-Mar-1966  DOA: 09/02/2018  PCP: Vassie Moment, MD   Outpatient Specialists: [facility]; eye   Requesting physician: Surgery  Reason for consultation: Getting IVF, creatinine trending up, but swelling up in legs.  Creatinine is currently 3.19.  He fell off a ladder and has rib fractures and possible PNA.  History of Present Illness: Willie Jordan is an 53 y.o. male with PMH significant for hypertension, hyperlipidemia, diabetes mellitus, peripheral neuropathy, CKD-2, and grade 2 diastolic CHF on echo in 4/13 who presented on 2/27 after a fall of about 5 feet from a ladder.  He was found to have multiple rib fractures and admitted to the trauma service.  He requires O2 or he feels tired and his vision looks dark.  No cough.  +wheezing.  Chronic LE edema, but worse than usual.  Chest pain from rib fractures.    Review of Systems:  ROS As per HPI otherwise 10 point review of systems negative.    Past Medical History: Past Medical History:  Diagnosis Date  . Diabetic neuropathy ([place]) 03/17/2017  . Hyperlipidemia   . Hypertension   . Microalbuminuria due to type 2 diabetes mellitus ([place]) 01/30/2017  . Neuropathy   . Onychomycosis of toenail 01/30/2017  . Peripheral neuropathy 03/17/2017  . Type 2 diabetes mellitus, uncontrolled ([place])     Past Surgical History: Past Surgical History:  Procedure Laterality Date  . AMPUTATION Left 09/29/2016   Procedure: 2nd RAY AMPUTATION LEFT FOOT I&D LEFT FOOT;  Surgeon: Edrick Kins, DPM;  Location: [place];  Service: Podiatry;  Laterality: Left;  . APPENDECTOMY       Allergies:   Allergies  Allergen Reactions  . Aspirin Rash     Social History:  reports that he quit smoking about 2 years ago. His smoking use included cigarettes. He started smoking about 2 years ago. He has never used smokeless tobacco. He reports that he does not drink alcohol or use  drugs.   Family History: Family History  Problem Relation Age of Onset  . Diabetes Mother   . Diabetes Sister   . Diabetes Brother   . Cancer Maternal Grandmother       Physical Exam: Vitals:   09/07/18 0245 09/07/18 0357 09/07/18 0829 09/07/18 0943  BP:  (!) 142/61  (!) 145/66  Pulse:  82 87 98  Resp:  15 18 16   Temp:  98.8 F (37.1 C)  100 F (37.8 C)  TempSrc:  Oral  Oral  SpO2: 90% 90% 92% 95%  Weight:      Height:        Constitutional: Alert and awake, oriented x3, not in any acute distress. Eyes: EOMI, irises appear normal, anicteric sclera,  ENMT: external ears and nose appear normal, normal hearing, Lips appear normal Neck: neck appears normal, no masses, normal ROM, no thyromegaly, no JVD  CVS: S1-S2 clear, no murmur rubs or gallops, 2-3+ pitting LE edema, normal pedal pulses  Respiratory:  Scattered rhonchi without clear consolidation  Abdomen: soft nontender, nondistended, normal bowel sounds, no hepatosplenomegaly, no hernias  Musculoskeletal: : no cyanosis, clubbing or edema noted bilaterally Neuro: Cranial nerves II-XII intact, strength, sensation, reflexes Psych: judgement and insight appear normal, stable mood and affect, mental status Skin: no rashes or lesions or ulcers, no induration or nodules    Data reviewed:  I have personally reviewed  the recent labs and imaging studies  Pertinent Labs:   CO2 21 Glucose 134 BUN 54/Creatinine 3.19/GFR 21; 57/2.87/24 on 2/29; 54/2.15/35 on 2/28; baseline creatinine appears to be about 1.5 WBC 11.1 Hgb 8.6; 9.9 on 3/2; 11.2 on 3/1; baseline Hgb appears to be about 10-11  Platelets 138   Inpatient Medications:   Scheduled Meds: . acetaminophen  1,000 mg Oral Q6H  . amLODipine  5 mg Oral Daily  . gabapentin  300 mg Oral TID  . glipiZIDE  10 mg Oral Q breakfast  . guaiFENesin  5 mL Oral BID  . heparin injection (subcutaneous)  5,000 Units Subcutaneous Q8H  . insulin aspart  0-20 Units Subcutaneous TID  WC  . insulin aspart  0-5 Units Subcutaneous QHS  . insulin aspart protamine- aspart  14 Units Subcutaneous BID WC  . ipratropium-albuterol  3 mL Nebulization TID  . lisinopril  20 mg Oral Daily  . methocarbamol  750 mg Oral TID  . metolazone  5 mg Oral Daily  . metoprolol tartrate  25 mg Oral BID  . rosuvastatin  20 mg Oral q1800   Continuous Infusions: . 0.9 % NaCl with KCl 20 mEq / L 10 mL/hr at 09/06/18 0905  . ceFEPime (MAXIPIME) IV 1 g (09/06/18 1247)     Radiological Exams on Admission: Dg Chest 2 View  Result Date: 09/05/2018 CLINICAL DATA:  Shortness of breath following fall from ladder. EXAM: CHEST - 2 VIEW COMPARISON:  09/03/2018 chest radiograph, 09/02/2018 chest CT and prior studies FINDINGS: Cardiomegaly and mild bibasilar atelectasis, LEFT-greater-than-RIGHT, again noted. No new pulmonary opacities are present. No evidence of pneumothorax or large pleural effusion. Several LEFT rib fractures are again identified. IMPRESSION: No significant change from 09/03/2018. Bibasilar atelectasis and LEFT rib fractures again noted without pneumothorax. Electronically Signed   By: Margarette Canada M.D.   On: 09/05/2018 13:05   Dg Chest Port 1 View  Result Date: 09/07/2018 CLINICAL DATA:  Follow-up pneumothorax. Short of breath. Left rib fractures. EXAM: PORTABLE CHEST 1 VIEW COMPARISON:  09/05/2018 and older exams. FINDINGS: There is by basilar opacity mildly increased when compared to the most recent prior exam, consistent with atelectasis. Suspect small associated left pleural effusion. Remainder of the lungs is clear. No pneumothorax. IMPRESSION: 1. Mild increase in lung base atelectasis since the most recent prior study. Probable small left pleural effusion. 2. No pneumothorax.  No pulmonary edema. Electronically Signed   By: Lajean Manes M.D.   On: 09/07/2018 09:21    Impression/Recommendations Principal Problem:   Multiple fractures of ribs, left side, initial encounter for closed  fracture Active Problems:   HLD (hyperlipidemia)   Essential hypertension   Diabetes mellitus ([place])   Acute respiratory failure with hypoxemia (HCC)   Acute renal failure superimposed on stage 2 chronic kidney disease (HCC)   Chronic diastolic CHF (congestive heart failure) (HCC)   Anemia due to chronic kidney disease  Rib fractures -Patient with a fall from a 5 foot ladder, landing on his left shoulder/ribcage with resultant fractures -Having splinting which is complicating his recovery -Ongoing pain with use of morphine, Oxycodone for pain -Will add Lidocaine patch (only 1 given the area) to try to topically alleviate his pain -Continue flutter valve -Encourage ambulation  Acute respiratory failure with hypoxia -O2 sats as low as 90% -Likely primarily related to splinting -There may also be a component of volume overload (see below) -While possible PNA was noted on CXR, suspect more likely above -Will check procalcitonin -  if negative, likely does not need ongoing antibiotics  Acute renal failure on stage 3 CKD -Patient with baseline stage 3 CKD -Current worsening with increased LE edema -Suspect this is related to renal hypoperfusion in the setting of volume overload -Will diurese with Lasix 60 mg IV x 1 - he normally takes 60 mg PO daily -Continue Zaroxolyn -Will check q12h BMP -If renal function is not improving, can try IVF -Patient is followed by nephrology, so would also consider consultation if not improving -Hold nephrotoxic medications including Lisinopril  Chronic diastolic CHF -Grade 2 diastolic dysfunction as of 1/19 -Suspect volume overload related to this issue is the problem rather than volume deficiency -Will follow -Hold ACE -Continue Lopressor -Continue Norvasc for now but consider holding since this can worsen edema  DM -A1c in 10/19 was 7.1, indicating reasonable control -hold Glucotrol -Cover with moderate-scale SSI  HTN -As noted above, hold  ACE and continue BB and CCB for now  HLD -Continue Crestor  Anemia -Suspect this is dilutional in the setting of volume overload -Will recheck in AM     Thank you for this consultation.  Our [facility] hospitalist team will follow the patient with you.   Time Spent: 60 minutes  Karmen Bongo M.D. [facility] 09/07/2018, 10:26 AM

## 2018-09-07 NOTE — Progress Notes (Signed)
[facility] Progress Note     Subjective: CC: pain with movement Stable chest pain from rib fractures. Patient reports he feels a little better today. Still requiring supplemental oxygen. Worked with flutter valve yesterday but not IS. Feels like he is coughing a little better. Leg swelling seems a little improved.   Objective: Vital signs in last 24 hours: Temp:  [98.6 F (37 C)-98.8 F (37.1 C)] 98.8 F (37.1 C) (03/03 0357) Pulse Rate:  [70-82] 82 (03/03 0357) Resp:  [15-16] 15 (03/03 0357) BP: (123-142)/(61) 142/61 (03/03 0357) SpO2:  [90 %-92 %] 90 % (03/03 0357) Last BM Date: 09/06/18  Intake/Output from previous day: 03/02 0701 - 03/03 0700 In: 1440 [P.O.:1440] Out: 400 [Urine:400] Intake/Output this shift: No intake/output data recorded.  PE: Gen:  Alert, NAD, pleasant Card:  Regular rate and rhythm, mild edema in bilateral LEs Pulm:  normal effort, on 4L via nasal cannula, lungs CTAB Abd: Soft, non-tender, non-distended, +BS Skin: warm and dry, no rashes  Psych: A&Ox3   Lab Results:  Recent Labs    09/06/18 0949 09/07/18 0348  WBC 14.7* 11.1*  HGB 9.9* 8.6*  HCT 31.4* 26.7*  PLT 180 138*   BMET Recent Labs    09/06/18 1601 09/07/18 0348  NA 135 135  K 3.6 3.6  CL 105 103  CO2 23 21*  GLUCOSE 150* 134*  BUN 56* 54*  CREATININE 3.24* 3.19*  CALCIUM 7.7* 7.8*   PT/INR No results for input(s): LABPROT, INR in the last 72 hours. CMP     Component Value Date/Time   NA 135 09/07/2018 0348   NA 137 04/19/2018 1256   K 3.6 09/07/2018 0348   CL 103 09/07/2018 0348   CO2 21 (L) 09/07/2018 0348   GLUCOSE 134 (H) 09/07/2018 0348   BUN 54 (H) 09/07/2018 0348   BUN 34 (H) 04/19/2018 1256   CREATININE 3.19 (H) 09/07/2018 0348   CALCIUM 7.8 (L) 09/07/2018 0348   PROT 6.0 04/16/2018 1114   ALBUMIN 2.8 (L) 04/16/2018 1114   AST 22 04/16/2018 1114   ALT 24 04/16/2018 1114   ALKPHOS 117 04/16/2018 1114   BILITOT <0.2 04/16/2018 1114   GFRNONAA 21 (L) 09/07/2018 0348   GFRAA 25 (L) 09/07/2018 0348   Lipase     Component Value Date/Time   LIPASE 150 (H) 05/24/2007 1515       Studies/Results: Dg Chest 2 View  Result Date: 09/05/2018 CLINICAL DATA:  Shortness of breath following fall from ladder. EXAM: CHEST - 2 VIEW COMPARISON:  09/03/2018 chest radiograph, 09/02/2018 chest CT and prior studies FINDINGS: Cardiomegaly and mild bibasilar atelectasis, LEFT-greater-than-RIGHT, again noted. No new pulmonary opacities are present. No evidence of pneumothorax or large pleural effusion. Several LEFT rib fractures are again identified. IMPRESSION: No significant change from 09/03/2018. Bibasilar atelectasis and LEFT rib fractures again noted without pneumothorax. Electronically Signed   By: Margarette Canada M.D.   On: 09/05/2018 13:05    Anti-infectives: Anti-infectives (From admission, onward)   Start     Dose/Rate Route Frequency Ordered Stop   09/06/18 1045  ceFEPIme (MAXIPIME) 1 g in sodium chloride 0.9 % 100 mL IVPB     1 g 200 mL/hr over 30 Minutes Intravenous Every 24 hours 09/06/18 1010         Assessment/Plan Fall from ladder L ribs 3-9 fractures (some segmental)- multimodal pain control, IS, pulmonary toilet - flutter valve - BID guaifenesin  HTN- home meds T2DM- SSI AKI on CKD stage  III - Cr 3.19 today, continue gentle IVF, changed lovenox to SQ heparin, hold lasix Hx of chronic diastolic CHF Possible PNA - cefepime started yesterday, WBC 11 from 14, afebrile last 24h  FEN: CM diet, IVF VTE: SCDs, SQ heparin  ID: cefepime 3/2>>  Dispo: Wean O2 as able. Continue IV fluids for AKI, medicine consult.   LOS: 4 days    Brigid Re , [facility] 09/07/2018, 8:26 AM Pager: [phone]

## 2018-09-08 ENCOUNTER — Inpatient Hospital Stay (HOSPITAL_COMMUNITY): Payer: Self-pay

## 2018-09-08 DIAGNOSIS — N170 Acute kidney failure with tubular necrosis: Secondary | ICD-10-CM

## 2018-09-08 DIAGNOSIS — E782 Mixed hyperlipidemia: Secondary | ICD-10-CM

## 2018-09-08 DIAGNOSIS — D631 Anemia in chronic kidney disease: Secondary | ICD-10-CM

## 2018-09-08 DIAGNOSIS — N183 Chronic kidney disease, stage 3 (moderate): Secondary | ICD-10-CM

## 2018-09-08 DIAGNOSIS — N182 Chronic kidney disease, stage 2 (mild): Secondary | ICD-10-CM

## 2018-09-08 DIAGNOSIS — I10 Essential (primary) hypertension: Secondary | ICD-10-CM

## 2018-09-08 DIAGNOSIS — J9601 Acute respiratory failure with hypoxia: Secondary | ICD-10-CM

## 2018-09-08 LAB — BASIC METABOLIC PANEL
ANION GAP: 10 (ref 5–15)
Anion gap: 9 (ref 5–15)
BUN: 57 mg/dL — ABNORMAL HIGH (ref 6–20)
BUN: 58 mg/dL — ABNORMAL HIGH (ref 6–20)
CO2: 23 mmol/L (ref 22–32)
CO2: 25 mmol/L (ref 22–32)
Calcium: 8.2 mg/dL — ABNORMAL LOW (ref 8.9–10.3)
Calcium: 8.4 mg/dL — ABNORMAL LOW (ref 8.9–10.3)
Chloride: 102 mmol/L (ref 98–111)
Chloride: 99 mmol/L (ref 98–111)
Creatinine, Ser: 2.94 mg/dL — ABNORMAL HIGH (ref 0.61–1.24)
Creatinine, Ser: 2.89 mg/dL — ABNORMAL HIGH (ref 0.61–1.24)
GFR calc Af Amer: 27 mL/min — ABNORMAL LOW (ref >60)
GFR calc Af Amer: 28 mL/min — ABNORMAL LOW (ref >60)
GFR calc non Af Amer: 24 mL/min — ABNORMAL LOW (ref >60)
GFR, EST NON AFRICAN AMERICAN: 24 mL/min — AB (ref >60)
Glucose, Bld: 121 mg/dL — ABNORMAL HIGH (ref 70–99)
Glucose, Bld: 261 mg/dL — ABNORMAL HIGH (ref 70–99)
Potassium: 3.4 mmol/L — ABNORMAL LOW (ref 3.5–5.1)
Potassium: 3.4 mmol/L — ABNORMAL LOW (ref 3.5–5.1)
SODIUM: 134 mmol/L — AB (ref 135–145)
Sodium: 134 mmol/L — ABNORMAL LOW (ref 135–145)

## 2018-09-08 LAB — CBC
HCT: 27.1 % — ABNORMAL LOW (ref 39.0–52.0)
Hemoglobin: 8.7 g/dL — ABNORMAL LOW (ref 13.0–17.0)
MCH: 26.3 pg (ref 26.0–34.0)
MCHC: 32.1 g/dL (ref 30.0–36.0)
MCV: 81.9 fL (ref 80.0–100.0)
NRBC: 0 % (ref 0.0–0.2)
Platelets: 171 10*3/uL (ref 150–400)
RBC: 3.31 MIL/uL — ABNORMAL LOW (ref 4.22–5.81)
RDW: 13.2 % (ref 11.5–15.5)
WBC: 10.7 10*3/uL — AB (ref 4.0–10.5)

## 2018-09-08 LAB — IRON AND TIBC
Iron: 20 ug/dL — ABNORMAL LOW (ref 45–182)
Saturation Ratios: 9 % — ABNORMAL LOW (ref 17.9–39.5)
TIBC: 211 ug/dL — ABNORMAL LOW (ref 250–450)
UIBC: 191 ug/dL

## 2018-09-08 LAB — RETICULOCYTES
Immature Retic Fract: 14.1 % (ref 2.3–15.9)
RBC.: 3.79 MIL/uL — ABNORMAL LOW (ref 4.22–5.81)
Retic Count, Absolute: 53.8 10*3/uL (ref 19.0–186.0)
Retic Ct Pct: 1.4 % (ref 0.4–3.1)

## 2018-09-08 LAB — GLUCOSE, CAPILLARY
GLUCOSE-CAPILLARY: 251 mg/dL — AB (ref 70–99)
GLUCOSE-CAPILLARY: 205 mg/dL — AB (ref 70–99)
Glucose-Capillary: 120 mg/dL — ABNORMAL HIGH (ref 70–99)
Glucose-Capillary: 232 mg/dL — ABNORMAL HIGH (ref 70–99)

## 2018-09-08 LAB — CK: CK TOTAL: 186 U/L (ref 49–397)

## 2018-09-08 LAB — VITAMIN B12: Vitamin B-12: 460 pg/mL (ref 180–914)

## 2018-09-08 MED ORDER — POLYETHYLENE GLYCOL 3350 17 G PO PACK
17.0000 g | PACK | Freq: Every day | ORAL | Status: DC
  Administered 2018-09-09: 17 g via ORAL
  Filled 2018-09-08: qty 1

## 2018-09-08 MED ORDER — IPRATROPIUM-ALBUTEROL 0.5-2.5 (3) MG/3ML IN SOLN
3.0000 mL | Freq: Four times a day (QID) | RESPIRATORY_TRACT | Status: DC
  Administered 2018-09-08 – 2018-09-09 (×3): 3 mL via RESPIRATORY_TRACT
  Filled 2018-09-08 (×3): qty 3

## 2018-09-08 MED ORDER — METHYLPREDNISOLONE SODIUM SUCC 125 MG IJ SOLR
40.0000 mg | Freq: Four times a day (QID) | INTRAMUSCULAR | Status: DC
  Administered 2018-09-08 – 2018-09-09 (×4): 40 mg via INTRAVENOUS
  Filled 2018-09-08 (×4): qty 2

## 2018-09-08 MED ORDER — FUROSEMIDE 10 MG/ML IJ SOLN
60.0000 mg | Freq: Once | INTRAMUSCULAR | Status: AC
  Administered 2018-09-08: 60 mg via INTRAVENOUS
  Filled 2018-09-08: qty 6

## 2018-09-08 MED ORDER — DOCUSATE SODIUM 100 MG PO CAPS
100.0000 mg | ORAL_CAPSULE | Freq: Two times a day (BID) | ORAL | Status: DC
  Administered 2018-09-08 – 2018-09-09 (×4): 100 mg via ORAL
  Filled 2018-09-08 (×4): qty 1

## 2018-09-08 MED ORDER — VANCOMYCIN HCL 10 G IV SOLR: 1250.0000 mg | INTRAVENOUS | Status: DC

## 2018-09-08 MED ORDER — FUROSEMIDE 10 MG/ML IJ SOLN
40.0000 mg | Freq: Two times a day (BID) | INTRAMUSCULAR | Status: DC
  Administered 2018-09-08 – 2018-09-09 (×2): 40 mg via INTRAVENOUS
  Filled 2018-09-08 (×2): qty 4

## 2018-09-08 MED ORDER — VANCOMYCIN HCL 10 G IV SOLR
2000.0000 mg | INTRAVENOUS | Status: AC
  Administered 2018-09-08: 2000 mg via INTRAVENOUS
  Filled 2018-09-08: qty 2000

## 2018-09-08 NOTE — Plan of Care (Signed)

## 2018-09-08 NOTE — Progress Notes (Signed)
PROGRESS NOTE    Willie Jordan  MBT:[id number] DOB: [birth year] DOA: 09/02/2018 PCP: Vassie Moment, MD (Confirm with patient/family/NH records and if not entered, this HAS to be entered at [facility] point of entry. "No PCP" if truly none.)   Brief Narrative: (Start on day 1 of progress note - keep it brief and live) Honest Vanleer is an 53 y.o. male with PMH significant for hypertension, hyperlipidemia, diabetes mellitus, peripheral neuropathy, CKD-2, and grade 2 diastolic CHF on echo in 5/36 who presented on 2/27 after a fall of about 5 feet from a ladder.  He was found to have multiple rib fractures and admitted to the trauma service.  He requires O2 or he feels tired and his vision looks dark.  Has been having cough with productive sputum.  Patient is started on cefepime.  Patient is also found to have acute renal failure.  Assessment & Plan:   Principal Problem:   Multiple fractures of ribs, left side, initial encounter for closed fracture Active Problems:   HLD (hyperlipidemia)   Essential hypertension   Diabetes mellitus ([place])   Acute respiratory failure with hypoxemia (HCC)   Acute renal failure superimposed on stage 2 chronic kidney disease (HCC)   Chronic diastolic CHF (congestive heart failure) (HCC)   Anemia due to chronic kidney disease   ##Acute hypoxic respiratory failure -Secondary to pneumonia, multiple rib fractures, pain, COPD exacerbation, congestive heart failure -Continue with oxygen through nasal cannula  ##Pneumonia -Secondary to atelectasis, secondary infection -Keep the patient on vancomycin, cefepime  ##COPD exacerbation -Duo nebs, Solu-Medrol  ##Acute on chronic renal failure -Concern about cardiorenal syndrome -We do not have patient's baseline -Get CPK level -Ultrasound of the kidneys -Urinalysis -Nephrology consult  ##Acute on chronic diastolic congestive heart failure -Patient has significant edema in upper and lower extremities -Get  echocardiogram  ##Diabetes mellitus -Hemoglobin A1c 7.1 -Holding oral medications and Glucotrol -Currently on sliding scale insulin  ##Multiple rib fractures bilateral -Continue with pain management as needed  ##Hypertension -Continue with metoprolol  ##Hyperlipidemia -Continue with Crestor  ##Anemia of unknown cause -Get iron profile, B12, folate RBC, reticulocyte count -Hemoglobin trending down  DVT prophylaxis: SCDs Code Status: (Full Family Communication: Patient Disposition Plan: Based on physical therapy evaluation  Procedures:  CT chest on 09/02/2018- 1. Multiple left-sided rib fractures as described above, nondisplaced, with no fracture complication. Specifically, no evidence of a lung laceration, pneumothorax or hemothorax.  Antimicrobials:  Vancomycin-09/08/2018 Cefepime 09/06/2018  Subjective: Complaining of cough with productive greenish sputum, shortness of breath, chest pain, bilateral increased edema  Objective: Vitals:   09/08/18 0739 09/08/18 0808 09/08/18 1500 09/08/18 1505  BP: (!) 160/73  (!) 142/71   Pulse: 73  68   Resp: 16  18   Temp: 98.2 F (36.8 C)  98 F (36.7 C)   TempSrc: Oral  Oral   SpO2: 94% (!) 85% 94% 92%  Weight:      Height:        Intake/Output Summary (Last 24 hours) at 09/08/2018 1554 Last data filed at 09/08/2018 0950 Gross per 24 hour  Intake 240 ml  Output 1880 ml  Net -1640 ml   Filed Weights   09/06/18 0416  Weight: 84 kg    Examination:  General exam: Appears calm and comfortable  Respiratory system: Bilateral wheezing, decreased breath sounds on the left side of the chest Cardiovascular system: S1 & S2 heard, RRR. No JVD, murmurs, rubs, gallops or clicks. No pedal edema. Gastrointestinal system: Abdomen is nondistended, soft  and nontender. No organomegaly or masses felt. Normal bowel sounds heard. Central nervous system: Alert and oriented. No focal neurological deficits. Extremities: Symmetric 5 x 5 power.   Anasarca Skin: No rashes, lesions or ulcers Psychiatry: Judgement and insight appear normal. Mood & affect appropriate.     Data Reviewed: I have personally reviewed following labs and imaging studies  CBC: Recent Labs  Lab 09/02/18 1510 09/03/18 0857 09/05/18 1021 09/06/18 0949 09/07/18 0348 09/08/18 0457  WBC 12.3* 8.8 9.4 14.7* 11.1* 10.7*  NEUTROABS 10.4*  --   --   --   --   --   HGB 10.8* 10.5* 11.2* 9.9* 8.6* 8.7*  HCT 34.8* 33.0* 34.3* 31.4* 26.7* 27.1*  MCV 83.1 82.5 81.3 81.8 82.4 81.9  PLT 176 177 189 180 138* 453   Basic Metabolic Panel: Recent Labs  Lab 09/05/18 1021 09/06/18 1601 09/07/18 0348 09/07/18 1558 09/08/18 0457  NA 135 135 135 132* 134*  K 3.6 3.6 3.6 3.5 3.4*  CL 103 105 103 100 102  CO2 24 23 21* 23 23  GLUCOSE 217* 150* 134* 210* 121*  BUN 55* 56* 54* 57* 57*  CREATININE 2.84* 3.24* 3.19* 3.31* 2.94*  CALCIUM 8.4* 7.7* 7.8* 8.1* 8.2*   GFR: Estimated Creatinine Clearance: 30.6 mL/min (A) (by C-G formula based on SCr of 2.94 mg/dL (H)). Liver Function Tests: No results for input(s): AST, ALT, ALKPHOS, BILITOT, PROT, ALBUMIN in the last 168 hours. No results for input(s): LIPASE, AMYLASE in the last 168 hours. No results for input(s): AMMONIA in the last 168 hours. Coagulation Profile: No results for input(s): INR, PROTIME in the last 168 hours. Cardiac Enzymes: No results for input(s): CKTOTAL, CKMB, CKMBINDEX, TROPONINI in the last 168 hours. BNP (last 3 results) No results for input(s): PROBNP in the last 8760 hours. HbA1C: No results for input(s): HGBA1C in the last 72 hours. CBG: Recent Labs  Lab 09/07/18 1429 09/07/18 1645 09/07/18 2114 09/08/18 0741 09/08/18 1145  GLUCAP 194* 195* 124* 120* 232*   Lipid Profile: No results for input(s): CHOL, HDL, LDLCALC, TRIG, CHOLHDL, LDLDIRECT in the last 72 hours. Thyroid Function Tests: No results for input(s): TSH, T4TOTAL, FREET4, T3FREE, THYROIDAB in the last 72  hours. Anemia Panel: No results for input(s): VITAMINB12, FOLATE, FERRITIN, TIBC, IRON, RETICCTPCT in the last 72 hours. Sepsis Labs: Recent Labs  Lab 09/07/18 1106  PROCALCITON 2.02    Recent Results (from the past 240 hour(s))  MRSA PCR Screening     Status: None   Collection Time: 09/02/18  8:46 PM  Result Value Ref Range Status   MRSA by PCR NEGATIVE NEGATIVE Final    Comment:        The GeneXpert MRSA Assay (FDA approved for NASAL specimens only), is one component of a comprehensive MRSA colonization surveillance program. It is not intended to diagnose MRSA infection nor to guide or monitor treatment for MRSA infections. Performed at [facility], [place] [address] [place], [place] [address]          Radiology Studies: Dg Chest Port 1 View  Result Date: 09/07/2018 CLINICAL DATA:  Follow-up pneumothorax. Short of breath. Left rib fractures. EXAM: PORTABLE CHEST 1 VIEW COMPARISON:  09/05/2018 and older exams. FINDINGS: There is by basilar opacity mildly increased when compared to the most recent prior exam, consistent with atelectasis. Suspect small associated left pleural effusion. Remainder of the lungs is clear. No pneumothorax. IMPRESSION: 1. Mild increase in lung base atelectasis since the most recent  prior study. Probable small left pleural effusion. 2. No pneumothorax.  No pulmonary edema. Electronically Signed   By: Lajean Manes M.D.   On: 09/07/2018 09:21        Scheduled Meds: . acetaminophen  1,000 mg Oral Q6H  . amLODipine  5 mg Oral Daily  . docusate sodium  100 mg Oral BID  . guaiFENesin  5 mL Oral BID  . heparin injection (subcutaneous)  5,000 Units Subcutaneous Q8H  . insulin aspart  0-20 Units Subcutaneous TID WC  . insulin aspart  0-5 Units Subcutaneous QHS  . insulin aspart protamine- aspart  14 Units Subcutaneous BID WC  . ipratropium-albuterol  3 mL Nebulization Q6H  . lidocaine  1 patch Transdermal Q24H  . methocarbamol  750 mg Oral  TID  . methylPREDNISolone (SOLU-MEDROL) injection  40 mg Intravenous Q6H  . metolazone  5 mg Oral Daily  . metoprolol tartrate  25 mg Oral BID  . polyethylene glycol  17 g Oral Daily  . rosuvastatin  20 mg Oral q1800   Continuous Infusions: . ceFEPime (MAXIPIME) IV 1 g (09/08/18 0917)     LOS: 5 days    Time spent: 47 minutes   Nakul Avino, MD [facility] Pager [phone]  If 7PM-7AM, please contact night-coverage [email or web address] Password [facility] 09/08/2018, 3:54 PM

## 2018-09-08 NOTE — Progress Notes (Signed)
Spoke w patient at bedside, he confirms PCP Tamika Lot, declined 3/1, will continue to follow for MATCH needs at DC.

## 2018-09-08 NOTE — Progress Notes (Signed)
Physical Therapy Treatment Patient Details Name: Willie Jordan MRN: [id number] DOB: September 01, 1965 Today's Date: 09/08/2018    History of Present Illness Patient is 53 y/o male admitted to hosptial following a fall from a ladder. Patient with multiple posterior and lateral L rib fxs (3-9). Elbow x-ray negative. PMH includes HTN, DMII, HLD, and peripheral neuropathy.     PT Comments    Patient seen for mobility progression. Pt tolerated increased gait distance without c/o dizziness however does continue to experience SpO2 desat and 2L O2 via [place]. Pt requires standing breaks and one seated break and cues for pursed lip breathing. SpO2 desat to 80% and up to 89% with rest and PLB. O2 up to 3L O2 however pt turned back down to 2L despite education. Gait training with SPC this session. Continue to progress as tolerated.    Follow Up Recommendations  No PT follow up     Equipment Recommendations  Other (comment)(possibly cane pending progress)    Recommendations for Other Services       Precautions / Restrictions Precautions Precautions: Fall Required Braces or Orthoses: Sling(LUE) Restrictions Weight Bearing Restrictions: No    Mobility  Bed Mobility               General bed mobility comments: HOB elevated with pt in long sitting upon arrival; mod I to sit EOB from this position  Transfers Overall transfer level: Needs assistance Equipment used: None Transfers: Sit to/from Stand Sit to Stand: Min guard         General transfer comment: min guard for safety; no physical assist needed  Ambulation/Gait Ambulation/Gait assistance: Min guard Gait Distance (Feet): (200 ft X 2 trials (1 seated break/multiple standing breaks)) Assistive device: IV Pole;Straight cane Gait Pattern/deviations: Step-through pattern;Decreased stride length;Drifts right/left Gait velocity: decreased   General Gait Details: cues for safe use of AD; cues for pursed lip breathing technique with  several standing breaks to increase SpO2; pt requires single UE support for stability/pain relief; SpO2 desat to 80%   Stairs             Wheelchair Mobility    Modified Rankin (Stroke Patients Only)       Balance Overall balance assessment: Needs assistance Sitting-balance support: No upper extremity supported;Feet supported Sitting balance-Leahy Scale: Good     Standing balance support: No upper extremity supported Standing balance-Leahy Scale: Fair                              Cognition Arousal/Alertness: Awake/alert Behavior During Therapy: WFL for tasks assessed/performed Overall Cognitive Status: Within Functional Limits for tasks assessed                                        Exercises      General Comments        Pertinent Vitals/Pain Pain Assessment: Faces Faces Pain Scale: Hurts little more Pain Location: L ribs and L UE with certain movements Pain Descriptors / Indicators: Aching;Grimacing;Guarding Pain Intervention(s): Limited activity within patient's tolerance;Monitored during session;Repositioned    Home Living                      Prior Function            PT Goals (current goals can now be found in the care plan section) Progress towards PT  goals: Progressing toward goals    Frequency    Min 3X/week      PT Plan Current plan remains appropriate    Co-evaluation              AM-PAC PT "6 Clicks" Mobility   Outcome Measure  Help needed turning from your back to your side while in a flat bed without using bedrails?: None Help needed moving from lying on your back to sitting on the side of a flat bed without using bedrails?: None Help needed moving to and from a bed to a chair (including a wheelchair)?: None Help needed standing up from a chair using your arms (e.g., wheelchair or bedside chair)?: A Little Help needed to walk in hospital room?: A Little Help needed climbing 3-5 steps  with a railing? : A Little 6 Click Score: 21    End of Session Equipment Utilized During Treatment: Oxygen;Other (comment)(L UE sling) Activity Tolerance: Patient tolerated treatment well;Other (comment)(O2 desat) Patient left: in chair;with call bell/phone within reach Nurse Communication: Mobility status PT Visit Diagnosis: Pain;Other abnormalities of gait and mobility (R26.89) Pain - Right/Left: Left Pain - part of body: (ribs)     Time: 1000-1024 PT Time Calculation (min) (ACUTE ONLY): 24 min  Charges:  $Gait Training: 23-37 mins                     Earney Navy, PTA Acute Rehabilitation Services Pager: [phone] Office: [phone]     Darliss Cheney 09/08/2018, 10:43 AM

## 2018-09-08 NOTE — Progress Notes (Addendum)
Pharmacy Antibiotic Note  Willie Jordan is a 53 y.o. male admitted on 09/02/2018 with multiple left rib fractures after a fall from ladder.  Pharmacy has been consulted for Vancomycin dosing for pneumonia.  WBC 10.7, afebrile, Tm 98.3.   He has AKI on CKD stage III,  SCr 2.94 today , down from 3.31 yesterday. CrCl ~ 30.6 ml/min. Cefepime started 09/06/18.   Vancomycin 1250 mg IV Q 48 hrs. Goal AUC 400-550. Expected AUC: 550 SCr used: 2.94   Plan: Vancomycin 2000 mg IV x1, then 1250 mg IV q48h  Monitor clinical progress, renal function.   Adjust vancomycin dose for renal function changes. Check steady state vancomycin levels per protocol if needed.     Height: 5\' 6"  (167.6 cm) Weight: 185 lb 3 oz (84 kg) IBW/kg (Calculated) : 63.8  Temp (24hrs), Avg:98.2 F (36.8 C), Min:98 F (36.7 C), Max:98.3 F (36.8 C)  Recent Labs  Lab 09/03/18 0857  09/05/18 1021 09/06/18 0949 09/06/18 1601 09/07/18 0348 09/07/18 1558 09/08/18 0457  WBC 8.8  --  9.4 14.7*  --  11.1*  --  10.7*  CREATININE 2.15*   < > 2.84*  --  3.24* 3.19* 3.31* 2.94*   < > = values in this interval not displayed.    Estimated Creatinine Clearance: 30.6 mL/min (A) (by C-G formula based on SCr of 2.94 mg/dL (H)).    Allergies  Allergen Reactions  . Aspirin Rash    Antimicrobials this admission: Cefepime 3/2>> Vancomycin 3/4>>  Dose adjustments this admission: n/a   Microbiology results: 2/27 MRSA PCR: negative  Thank you for allowing pharmacy to be a part of this patient's care. Nicole Cella, RPh Clinical Pharmacist Please check AMION for all [place] phone numbers After 10:00 PM, call [place] [phone] 09/08/2018 3:55 PM

## 2018-09-08 NOTE — Progress Notes (Signed)
[facility] Progress Note     Subjective: CC: pain Stable rib pain. Patient still requiring some supplemental oxygen but down to 2L from 4L. Feels like LEs are less swollen. Patient is tolerating a diet and passing flatus but has not had a BM in several days.   Objective: Vital signs in last 24 hours: Temp:  [98.2 F (36.8 C)-100 F (37.8 C)] 98.2 F (36.8 C) (03/04 0739) Pulse Rate:  [72-98] 73 (03/04 0739) Resp:  [16-20] 16 (03/04 0739) BP: (127-171)/(61-78) 160/73 (03/04 0739) SpO2:  [85 %-95 %] 85 % (03/04 0808) Last BM Date: 09/06/18  Intake/Output from previous day: 03/03 0701 - 03/04 0700 In: 200 [IV Piggyback:200] Out: 3150 [Urine:3150] Intake/Output this shift: No intake/output data recorded.  PE: Gen: Alert, NAD, pleasant Card: Regular rate and rhythm, mild edema in bilateral LEs Pulm:normal effort, on 2L via nasal cannula, lungs CTAB Abd: Soft, non-tender, non-distended,+BS Skin: warm and dry, no rashes  Psych: A&Ox3   Lab Results:  Recent Labs    09/07/18 0348 09/08/18 0457  WBC 11.1* 10.7*  HGB 8.6* 8.7*  HCT 26.7* 27.1*  PLT 138* 171   BMET Recent Labs    09/07/18 1558 09/08/18 0457  NA 132* 134*  K 3.5 3.4*  CL 100 102  CO2 23 23  GLUCOSE 210* 121*  BUN 57* 57*  CREATININE 3.31* 2.94*  CALCIUM 8.1* 8.2*   PT/INR No results for input(s): LABPROT, INR in the last 72 hours. CMP     Component Value Date/Time   NA 134 (L) 09/08/2018 0457   NA 137 04/19/2018 1256   K 3.4 (L) 09/08/2018 0457   CL 102 09/08/2018 0457   CO2 23 09/08/2018 0457   GLUCOSE 121 (H) 09/08/2018 0457   BUN 57 (H) 09/08/2018 0457   BUN 34 (H) 04/19/2018 1256   CREATININE 2.94 (H) 09/08/2018 0457   CALCIUM 8.2 (L) 09/08/2018 0457   PROT 6.0 04/16/2018 1114   ALBUMIN 2.8 (L) 04/16/2018 1114   AST 22 04/16/2018 1114   ALT 24 04/16/2018 1114   ALKPHOS 117 04/16/2018 1114   BILITOT <0.2 04/16/2018 1114   GFRNONAA 24 (L) 09/08/2018 0457   GFRAA  27 (L) 09/08/2018 0457   Lipase     Component Value Date/Time   LIPASE 150 (H) 05/24/2007 1515       Studies/Results: Dg Chest Port 1 View  Result Date: 09/07/2018 CLINICAL DATA:  Follow-up pneumothorax. Short of breath. Left rib fractures. EXAM: PORTABLE CHEST 1 VIEW COMPARISON:  09/05/2018 and older exams. FINDINGS: There is by basilar opacity mildly increased when compared to the most recent prior exam, consistent with atelectasis. Suspect small associated left pleural effusion. Remainder of the lungs is clear. No pneumothorax. IMPRESSION: 1. Mild increase in lung base atelectasis since the most recent prior study. Probable small left pleural effusion. 2. No pneumothorax.  No pulmonary edema. Electronically Signed   By: Lajean Manes M.D.   On: 09/07/2018 09:21    Anti-infectives: Anti-infectives (From admission, onward)   Start     Dose/Rate Route Frequency Ordered Stop   09/06/18 1045  ceFEPIme (MAXIPIME) 1 g in sodium chloride 0.9 % 100 mL IVPB     1 g 200 mL/hr over 30 Minutes Intravenous Every 24 hours 09/06/18 1010         Assessment/Plan Fall from ladder L ribs 3-9 fractures (some segmental)- multimodal pain control, IS, pulmonary toilet - flutter valve - BID guaifenesin, scheduled nebs HTN- home meds T2DM-SSI  AKI on CKD stage III- Cr 2.94 today, appreciate internal medicine consult, give IV lasix again today Hx of chronic diastolic CHF Possible PNA - cefepime started 3/2, WBC 10, afebrile last 24h  FEN: CM diet, will add bowel regimen VTE: SCDs, SQ heparin  ID: cefepime 3/2>>  Dispo:Wean O2 as able. Continue PT/OT. Hopefully home in the next few days if continuing to improve  LOS: 5 days    Brigid Re , [facility] 09/08/2018, 8:52 AM Pager: [phone]

## 2018-09-08 NOTE — Plan of Care (Signed)
  Problem: Education: Goal: Knowledge of General Education information will improve Description: Including pain rating scale, medication(s)/side effects and non-pharmacologic comfort measures Outcome: Progressing   Problem: Clinical Measurements: Goal: Will remain free from infection Outcome: Progressing   Problem: Clinical Measurements: Goal: Respiratory complications will improve Outcome: Progressing   Problem: Activity: Goal: Risk for activity intolerance will decrease Outcome: Progressing   Problem: Pain Managment: Goal: General experience of comfort will improve Outcome: Progressing   Problem: Safety: Goal: Ability to remain free from injury will improve Outcome: Progressing   Problem: Skin Integrity: Goal: Risk for impaired skin integrity will decrease Outcome: Progressing   

## 2018-09-08 NOTE — Plan of Care (Signed)
  Problem: Education: Goal: Knowledge of General Education information will improve Description Including pain rating scale, medication(s)/side effects and non-pharmacologic comfort measures 09/08/2018 1151 by Youlanda Roys, RN Outcome: Progressing 09/08/2018 1151 by Youlanda Roys, RN Outcome: Progressing   Problem: Health Behavior/Discharge Planning: Goal: Ability to manage health-related needs will improve 09/08/2018 1151 by Youlanda Roys, RN Outcome: Progressing 09/08/2018 1151 by Youlanda Roys, RN Outcome: Progressing   Problem: Clinical Measurements: Goal: Ability to maintain clinical measurements within normal limits will improve 09/08/2018 1151 by Youlanda Roys, RN Outcome: Progressing 09/08/2018 1151 by Youlanda Roys, RN Outcome: Progressing Goal: Will remain free from infection 09/08/2018 1151 by Youlanda Roys, RN Outcome: Progressing 09/08/2018 1151 by Youlanda Roys, RN Outcome: Progressing Goal: Diagnostic test results will improve 09/08/2018 1151 by Youlanda Roys, RN Outcome: Progressing 09/08/2018 1151 by Youlanda Roys, RN Outcome: Progressing Goal: Respiratory complications will improve 09/08/2018 1151 by Youlanda Roys, RN Outcome: Progressing 09/08/2018 1151 by Youlanda Roys, RN Outcome: Progressing Goal: Cardiovascular complication will be avoided 09/08/2018 1151 by Youlanda Roys, RN Outcome: Progressing 09/08/2018 1151 by Youlanda Roys, RN Outcome: Progressing   Problem: Activity: Goal: Risk for activity intolerance will decrease 09/08/2018 1151 by Youlanda Roys, RN Outcome: Progressing 09/08/2018 1151 by Youlanda Roys, RN Outcome: Progressing   Problem: Nutrition: Goal: Adequate nutrition will be maintained 09/08/2018 1151 by Youlanda Roys, RN Outcome: Progressing 09/08/2018 1151 by Youlanda Roys, RN Outcome: Progressing   Problem: Coping: Goal: Level of anxiety will decrease 09/08/2018 1151 by Youlanda Roys, RN Outcome: Progressing 09/08/2018 1151 by Youlanda Roys,  RN Outcome: Progressing   Problem: Elimination: Goal: Will not experience complications related to bowel motility 09/08/2018 1151 by Youlanda Roys, RN Outcome: Progressing 09/08/2018 1151 by Youlanda Roys, RN Outcome: Progressing Goal: Will not experience complications related to urinary retention 09/08/2018 1151 by Youlanda Roys, RN Outcome: Progressing 09/08/2018 1151 by Youlanda Roys, RN Outcome: Progressing   Problem: Pain Managment: Goal: General experience of comfort will improve 09/08/2018 1151 by Youlanda Roys, RN Outcome: Progressing 09/08/2018 1151 by Youlanda Roys, RN Outcome: Progressing   Problem: Safety: Goal: Ability to remain free from injury will improve 09/08/2018 1151 by Youlanda Roys, RN Outcome: Progressing 09/08/2018 1151 by Youlanda Roys, RN Outcome: Progressing   Problem: Skin Integrity: Goal: Risk for impaired skin integrity will decrease 09/08/2018 1151 by Youlanda Roys, RN Outcome: Progressing 09/08/2018 1151 by Youlanda Roys, RN Outcome: Progressing

## 2018-09-09 ENCOUNTER — Inpatient Hospital Stay (HOSPITAL_COMMUNITY): Payer: Self-pay

## 2018-09-09 DIAGNOSIS — E0810 Diabetes mellitus due to underlying condition with ketoacidosis without coma: Secondary | ICD-10-CM

## 2018-09-09 DIAGNOSIS — I5032 Chronic diastolic (congestive) heart failure: Secondary | ICD-10-CM

## 2018-09-09 DIAGNOSIS — R0609 Other forms of dyspnea: Secondary | ICD-10-CM

## 2018-09-09 LAB — CBC
HEMATOCRIT: 29.9 % — AB (ref 39.0–52.0)
Hemoglobin: 9.9 g/dL — ABNORMAL LOW (ref 13.0–17.0)
MCH: 26.5 pg (ref 26.0–34.0)
MCHC: 33.1 g/dL (ref 30.0–36.0)
MCV: 79.9 fL — AB (ref 80.0–100.0)
Platelets: 188 10*3/uL (ref 150–400)
RBC: 3.74 MIL/uL — ABNORMAL LOW (ref 4.22–5.81)
RDW: 12.8 % (ref 11.5–15.5)
WBC: 5.7 10*3/uL (ref 4.0–10.5)
nRBC: 0 % (ref 0.0–0.2)

## 2018-09-09 LAB — PROTEIN / CREATININE RATIO, URINE
CREATININE, URINE: 61.82 mg/dL
Protein Creatinine Ratio: 10.68 mg/mg{Cre} — ABNORMAL HIGH (ref 0.00–0.15)
Total Protein, Urine: 660 mg/dL

## 2018-09-09 LAB — GLUCOSE, CAPILLARY
Glucose-Capillary: 283 mg/dL — ABNORMAL HIGH (ref 70–99)
Glucose-Capillary: 425 mg/dL — ABNORMAL HIGH (ref 70–99)
Glucose-Capillary: 411 mg/dL — ABNORMAL HIGH (ref 70–99)
Glucose-Capillary: 370 mg/dL — ABNORMAL HIGH (ref 70–99)
Glucose-Capillary: 313 mg/dL — ABNORMAL HIGH (ref 70–99)
Glucose-Capillary: 105 mg/dL — ABNORMAL HIGH (ref 70–99)

## 2018-09-09 LAB — BASIC METABOLIC PANEL
Anion gap: 11 (ref 5–15)
BUN: 61 mg/dL — ABNORMAL HIGH (ref 6–20)
CO2: 24 mmol/L (ref 22–32)
Calcium: 8.3 mg/dL — ABNORMAL LOW (ref 8.9–10.3)
Chloride: 97 mmol/L — ABNORMAL LOW (ref 98–111)
Creatinine, Ser: 3.08 mg/dL — ABNORMAL HIGH (ref 0.61–1.24)
GFR calc Af Amer: 26 mL/min — ABNORMAL LOW (ref >60)
GFR calc non Af Amer: 22 mL/min — ABNORMAL LOW (ref >60)
Glucose, Bld: 338 mg/dL — ABNORMAL HIGH (ref 70–99)
Potassium: 3.7 mmol/L (ref 3.5–5.1)
Sodium: 132 mmol/L — ABNORMAL LOW (ref 135–145)

## 2018-09-09 LAB — ECHOCARDIOGRAM COMPLETE
Height: 66 in
Weight: 2962.98 oz

## 2018-09-09 MED ORDER — HYDRALAZINE HCL 25 MG PO TABS
25.0000 mg | ORAL_TABLET | Freq: Three times a day (TID) | ORAL | Status: DC
  Administered 2018-09-09 – 2018-09-10 (×4): 25 mg via ORAL
  Filled 2018-09-09 (×4): qty 1

## 2018-09-09 MED ORDER — INSULIN ASPART PROT & ASPART (70-30 MIX) 100 UNIT/ML ~~LOC~~ SUSP
18.0000 [IU] | Freq: Two times a day (BID) | SUBCUTANEOUS | Status: DC
  Filled 2018-09-09: qty 10

## 2018-09-09 MED ORDER — FERROUS SULFATE 325 (65 FE) MG PO TABS
325.0000 mg | ORAL_TABLET | Freq: Two times a day (BID) | ORAL | Status: DC
  Administered 2018-09-09 – 2018-09-10 (×3): 325 mg via ORAL
  Filled 2018-09-09 (×3): qty 1

## 2018-09-09 MED ORDER — INSULIN ASPART PROT & ASPART (70-30 MIX) 100 UNIT/ML ~~LOC~~ SUSP
16.0000 [IU] | Freq: Two times a day (BID) | SUBCUTANEOUS | Status: DC
  Filled 2018-09-09: qty 10

## 2018-09-09 MED ORDER — TORSEMIDE 20 MG PO TABS
80.0000 mg | ORAL_TABLET | Freq: Two times a day (BID) | ORAL | Status: DC
  Administered 2018-09-09 – 2018-09-10 (×3): 80 mg via ORAL
  Filled 2018-09-09 (×4): qty 4

## 2018-09-09 MED ORDER — NIFEDIPINE ER OSMOTIC RELEASE 60 MG PO TB24
60.0000 mg | ORAL_TABLET | Freq: Every day | ORAL | Status: DC
  Administered 2018-09-09 – 2018-09-10 (×2): 60 mg via ORAL
  Filled 2018-09-09 (×2): qty 1

## 2018-09-09 MED ORDER — DOXYCYCLINE HYCLATE 100 MG PO TABS
100.0000 mg | ORAL_TABLET | Freq: Two times a day (BID) | ORAL | Status: DC
  Administered 2018-09-09 – 2018-09-10 (×2): 100 mg via ORAL
  Filled 2018-09-09 (×2): qty 1

## 2018-09-09 MED ORDER — INSULIN ASPART PROT & ASPART (70-30 MIX) 100 UNIT/ML ~~LOC~~ SUSP
30.0000 [IU] | Freq: Two times a day (BID) | SUBCUTANEOUS | Status: DC
  Administered 2018-09-09: 30 [IU] via SUBCUTANEOUS
  Filled 2018-09-09: qty 10

## 2018-09-09 NOTE — Consult Note (Signed)
[facility]  CONSULTATION NOTE  Willie Jordan is an 53 y.o. male.    Reason for Consult: AKI Requesting Provider: Brigid Re PA  HPI: 53yo M with DM, HTN, HL, HFpEF (grade 2 DD 07/2017), CKD who is currently admitted to [facility] following a fall off a ladder complicated by rib fractures and PNA.  Nephrology is consulted for AKI on CKD.    I actually met Willie Jordan in outpatient clinic a few weeks ago for proteinuria and worsening renal function.  He has nephrotic syndrome and I was supposed to meet with him tomorrow to discuss/arrange a renal biopsy but he's currently hospitalized.   He has known diabetes with some complications but it's been relatively well controlled lately and despite that he has developed nephrotic syndrome and rapidly worsening renal function. He has no systemic symptoms to suggest SLE or other process.  He has not taken NSAIDs lately.  During this admission he's been treated with pain control and antibiotics for PNA (Vanc, cefepime), steroids and nebs for COPD exacerbation.  He's been diuresed for presumed acute on chronic diastolic heart failure.  Creatinine at admission 2.19 and has trended up to peak 3.3 on 3/3, 3.08 today. I/Os showing net neg ~3L for the admission.  Currently receiving lasix 40 IV BID + metolazone 5.  He had a normal renal US 2/26 and it was repeated unchanged 3/4.  In light of worsening renal function ACE-I held, metformin stopped.     PMH: Past Medical History:  Diagnosis Date  . Diabetic neuropathy ([place]) 03/17/2017  . Hyperlipidemia   . Hypertension   . Microalbuminuria due to type 2 diabetes mellitus ([place]) 01/30/2017  . Neuropathy   . Onychomycosis of toenail 01/30/2017  . Peripheral neuropathy 03/17/2017  . Type 2 diabetes mellitus, uncontrolled (HCC)    PSH: Past Surgical History:  Procedure Laterality Date  . AMPUTATION Left 09/29/2016   Procedure: 2nd RAY AMPUTATION LEFT FOOT I&D LEFT FOOT;  Surgeon: Edrick Kins, DPM;   Location: [place];  Service: Podiatry;  Laterality: Left;  . APPENDECTOMY      Past Medical History:  Diagnosis Date  . Diabetic neuropathy ([place]) 03/17/2017  . Hyperlipidemia   . Hypertension   . Microalbuminuria due to type 2 diabetes mellitus ([place]) 01/30/2017  . Neuropathy   . Onychomycosis of toenail 01/30/2017  . Peripheral neuropathy 03/17/2017  . Type 2 diabetes mellitus, uncontrolled (HCC)     Medications:  I have reviewed the patient's current medications.  Medications Prior to Admission  Medication Sig Dispense Refill  . amLODipine (NORVASC) 10 MG tablet 1 tab by mouth daily (Patient taking differently: 5 mg. 1 tab by mouth daily) 30 tablet 11  . furosemide (LASIX) 40 MG tablet 1 1/2 tabs by mouth in morning only daily 30 tablet   . gabapentin (NEURONTIN) 300 MG capsule Take 1 capsule (300 mg total) by mouth 3 (three) times daily. 90 capsule 0  . glipiZIDE (GLUCOTROL XL) 5 MG 24 hr tablet Take 10 mg by mouth daily with breakfast.     . ibuprofen (ADVIL,MOTRIN) 800 MG tablet Take 800 mg by mouth 4 (four) times daily.    . Insulin Lispro Prot & Lispro (HUMALOG MIX 75/25 KWIKPEN) (75-25) 100 UNIT/ML Kwikpen Inject 26 units before breakfast and 24 units before dinner daily 15 mL 11  . lisinopril (PRINIVIL,ZESTRIL) 20 MG tablet Take 0.5 tablets (10 mg total) by mouth daily. (Patient taking differently: Take 20 mg by mouth daily. ) 90 tablet  3  . metFORMIN (GLUCOPHAGE-XR) 500 MG 24 hr tablet 2 tabs by mouth twice daily with meals 360 tablet 3  . metolazone (ZAROXOLYN) 5 MG tablet Take 5 mg by mouth daily.    . metoprolol tartrate (LOPRESSOR) 25 MG tablet Take 1 tablet (25 mg total) by mouth 2 (two) times daily. 60 tablet 11  . rosuvastatin (CRESTOR) 20 MG tablet 1 tab by mouth daily with dinner 30 tablet 11  . amoxicillin-clavulanate (AUGMENTIN) 875-125 MG tablet Take 1 tablet by mouth 2 (two) times daily. (Patient not taking: Reported on 04/22/2018) 14 tablet 0  . blood glucose meter  kit and supplies Dispense based on patient and insurance preference. Use up to four times daily as directed. (FOR ICD-9 250.00, 250.01). 1 each 0  . Cyanocobalamin (VITAMIN B 12 PO) Take 1 tablet by mouth daily.     . Insulin Pen Needle 31G X 5 MM MISC Use as directed twice daily 60 each 11  . Omega-3 Fatty Acids (FISH OIL PO) Take 1 capsule by mouth daily.       ALLERGIES:   Allergies  Allergen Reactions  . Aspirin Rash    FAM HX: Family History  Problem Relation Age of Onset  . Diabetes Mother   . Diabetes Sister   . Diabetes Brother   . Cancer Maternal Grandmother     Social History:   reports that he quit smoking about 2 years ago. His smoking use included cigarettes. He started smoking about 2 years ago. He has never used smokeless tobacco. He reports that he does not drink alcohol or use drugs.  ROS: 12 system ROS neg except per HPI above  Blood pressure (!) 169/76, pulse 86, temperature 97.8 F (36.6 C), temperature source Oral, resp. rate 18, height _0  (1.676 m), weight 84 kg, SpO2 91 %. PHYSICAL EXAM: Gen: sitting in bedside chair eating breakfast Eyes: anicteric ENT: MMM CV: RRR Lungs: rales in bases R> L mainly clear with deep inspiration Abd: soft Extr: 2+ pitting to thigh Neuro: nonfocal Psych: normal    Results for orders placed or performed during the hospital encounter of 09/02/18 (from the past 48 hour(s))  Glucose, capillary     Status: Abnormal   Collection Time: 09/07/18  2:29 PM  Result Value Ref Range   Glucose-Capillary 194 (H) 70 - 99 mg/dL  Basic metabolic panel     Status: Abnormal   Collection Time: 09/07/18  3:58 PM  Result Value Ref Range   Sodium 132 (L) 135 - 145 mmol/L   Potassium 3.5 3.5 - 5.1 mmol/L   Chloride 100 98 - 111 mmol/L   CO2 23 22 - 32 mmol/L   Glucose, Bld 210 (H) 70 - 99 mg/dL   BUN 57 (H) 6 - 20 mg/dL   Creatinine, Ser 3.31 (H) 0.61 - 1.24 mg/dL   Calcium 8.1 (L) 8.9 - 10.3 mg/dL   GFR calc non Af Amer 21 (L)  >60 mL/min   GFR calc Af Amer 24 (L) >60 mL/min   Anion gap 9 5 - 15    Comment: Performed at [facility], [address], [place], [place] [address]  Glucose, capillary     Status: Abnormal   Collection Time: 09/07/18  4:45 PM  Result Value Ref Range   Glucose-Capillary 195 (H) 70 - 99 mg/dL  Glucose, capillary     Status: Abnormal   Collection Time: 09/07/18  9:14 PM  Result Value Ref Range   Glucose-Capillary  124 (H) 70 - 99 mg/dL  Basic metabolic panel     Status: Abnormal   Collection Time: 09/08/18  4:57 AM  Result Value Ref Range   Sodium 134 (L) 135 - 145 mmol/L   Potassium 3.4 (L) 3.5 - 5.1 mmol/L   Chloride 102 98 - 111 mmol/L   CO2 23 22 - 32 mmol/L   Glucose, Bld 121 (H) 70 - 99 mg/dL   BUN 57 (H) 6 - 20 mg/dL   Creatinine, Ser 2.94 (H) 0.61 - 1.24 mg/dL   Calcium 8.2 (L) 8.9 - 10.3 mg/dL   GFR calc non Af Amer 24 (L) >60 mL/min   GFR calc Af Amer 27 (L) >60 mL/min   Anion gap 9 5 - 15    Comment: Performed at [facility] [address], [place], [place] [address]  CBC     Status: Abnormal   Collection Time: 09/08/18  4:57 AM  Result Value Ref Range   WBC 10.7 (H) 4.0 - 10.5 K/uL   RBC 3.31 (L) 4.22 - 5.81 MIL/uL   Hemoglobin 8.7 (L) 13.0 - 17.0 g/dL   HCT 27.1 (L) 39.0 - 52.0 %   MCV 81.9 80.0 - 100.0 fL   MCH 26.3 26.0 - 34.0 pg   MCHC 32.1 30.0 - 36.0 g/dL   RDW 13.2 11.5 - 15.5 %   Platelets 171 150 - 400 K/uL   nRBC 0.0 0.0 - 0.2 %    Comment: Performed at [facility], [place] [address] [place], [place] [address]  Glucose, capillary     Status: Abnormal   Collection Time: 09/08/18  7:41 AM  Result Value Ref Range   Glucose-Capillary 120 (H) 70 - 99 mg/dL  Glucose, capillary     Status: Abnormal   Collection Time: 09/08/18 11:45 AM  Result Value Ref Range   Glucose-Capillary 232 (H) 70 - 99 mg/dL  Basic metabolic panel     Status: Abnormal   Collection Time: 09/08/18  3:44 PM  Result Value Ref Range   Sodium 134 (L) 135 - 145  mmol/L   Potassium 3.4 (L) 3.5 - 5.1 mmol/L   Chloride 99 98 - 111 mmol/L   CO2 25 22 - 32 mmol/L   Glucose, Bld 261 (H) 70 - 99 mg/dL   BUN 58 (H) 6 - 20 mg/dL   Creatinine, Ser 2.89 (H) 0.61 - 1.24 mg/dL   Calcium 8.4 (L) 8.9 - 10.3 mg/dL   GFR calc non Af Amer 24 (L) >60 mL/min   GFR calc Af Amer 28 (L) >60 mL/min   Anion gap 10 5 - 15    Comment: Performed at [facility] [address], [place], [place] [address]  Iron and TIBC     Status: Abnormal   Collection Time: 09/08/18  4:41 PM  Result Value Ref Range   Iron 20 (L) 45 - 182 ug/dL   TIBC 211 (L) 250 - 450 ug/dL   Saturation Ratios 9 (L) 17.9 - 39.5 %   UIBC 191 ug/dL    Comment: Performed at [facility], [place] [address] [place], [place] [address]  Vitamin B12     Status: None   Collection Time: 09/08/18  4:41 PM  Result Value Ref Range   Vitamin B-12 460 180 - 914 pg/mL    Comment: (NOTE) This assay is not validated for testing neonatal or myeloproliferative syndrome specimens for Vitamin B12 levels. Performed at [facility]  [facility], [place] [address], [place], [place] [address]   Reticulocytes     Status: Abnormal   Collection Time: 09/08/18  4:41 PM  Result Value Ref Range   Retic Ct Pct 1.4 0.4 - 3.1 %   RBC. 3.79 (L) 4.22 - 5.81 MIL/uL   Retic Count, Absolute 53.8 19.0 - 186.0 K/uL   Immature Retic Fract 14.1 2.3 - 15.9 %    Comment: Performed at [facility] [address], [place], [place] [address]  CK     Status: None   Collection Time: 09/08/18  4:41 PM  Result Value Ref Range   Total CK 186 49 - 397 U/L    Comment: Performed at [facility], [place] [address], [place], [place] [address]  Glucose, capillary     Status: Abnormal   Collection Time: 09/08/18  5:28 PM  Result Value Ref Range   Glucose-Capillary 251 (H) 70 - 99 mg/dL  Glucose, capillary     Status: Abnormal   Collection Time: 09/08/18  9:07 PM  Result Value Ref Range   Glucose-Capillary 205 (H) 70 - 99 mg/dL  Basic  metabolic panel     Status: Abnormal   Collection Time: 09/09/18  3:27 AM  Result Value Ref Range   Sodium 132 (L) 135 - 145 mmol/L   Potassium 3.7 3.5 - 5.1 mmol/L   Chloride 97 (L) 98 - 111 mmol/L   CO2 24 22 - 32 mmol/L   Glucose, Bld 338 (H) 70 - 99 mg/dL   BUN 61 (H) 6 - 20 mg/dL   Creatinine, Ser 3.08 (H) 0.61 - 1.24 mg/dL   Calcium 8.3 (L) 8.9 - 10.3 mg/dL   GFR calc non Af Amer 22 (L) >60 mL/min   GFR calc Af Amer 26 (L) >60 mL/min   Anion gap 11 5 - 15    Comment: Performed at [facility] [address], [place], [place] [address]  CBC     Status: Abnormal   Collection Time: 09/09/18  3:27 AM  Result Value Ref Range   WBC 5.7 4.0 - 10.5 K/uL   RBC 3.74 (L) 4.22 - 5.81 MIL/uL   Hemoglobin 9.9 (L) 13.0 - 17.0 g/dL   HCT 29.9 (L) 39.0 - 52.0 %   MCV 79.9 (L) 80.0 - 100.0 fL   MCH 26.5 26.0 - 34.0 pg   MCHC 33.1 30.0 - 36.0 g/dL   RDW 12.8 11.5 - 15.5 %   Platelets 188 150 - 400 K/uL   nRBC 0.0 0.0 - 0.2 %    Comment: Performed at [facility], [place] [address], [place], [place] [address]  Glucose, capillary     Status: Abnormal   Collection Time: 09/09/18  8:24 AM  Result Value Ref Range   Glucose-Capillary 283 (H) 70 - 99 mg/dL  Glucose, capillary     Status: Abnormal   Collection Time: 09/09/18 12:51 PM  Result Value Ref Range   Glucose-Capillary 425 (H) 70 - 99 mg/dL    US Renal  Result Date: 09/08/2018 CLINICAL DATA:  Acute renal failure. Fell from a ladder on 09/02/2018. EXAM: RENAL / URINARY TRACT ULTRASOUND COMPLETE COMPARISON:  09/01/2018. FINDINGS: Right Kidney: Renal measurements: 12.7 x 5.6 x 5.3 cm = volume: 199 mL. Borderline increased in echogenicity. No mass or hydronephrosis. Left Kidney: Renal measurements: 12.9 x 6.6 x 5.3 cm = volume: 240 mL. Borderline increased in echogenicity. No mass or hydronephrosis. Bladder: Appears  normal for degree of bladder distention. IMPRESSION: Borderline increased echogenicity of both kidneys, suggesting  minimal changes of medical renal disease. Otherwise, normal examination. Electronically Signed   By: Claudie Revering M.D.   On: 09/08/2018 20:05    Assessment/Plan 1. AKI on CKD in setting of nephrotic syndrome:   Will complete serologic w/u and proceed with renal biopsy while admitted.  Appreciate IR involvement.  Certainly may turn out to be DM but the progression had been rapid and his nephrotic syndrome is quite florid.  Sent antiPLA2R Ab as well - it's a send out.  Diuresis, statin.  Holding sq heparin for biopsy.  Cont to hold ACE-I.  2. Volume overload secondary to #1:  On lasix 40 IV BID + metolazone 5 daily.  Likely will be discharged soon so will convert to torsemide 80 BID + metolazone 5 daily to see if can sustain.    3.  Anemia:  Hb in the 8-9s. Iron sat 9% 3/4. Started po iron. Hemoccult.    4.  Pneumonia:  On IV antibiotics.  Dose per pharmacy.   5.  COPD exacerbation: no wheezing noted, on IV steroids and nebs.   6.  HTN: at least in part volume driven - diuresing.  Cont amlodipine 10 daily, nifedipine 60, metoprolol 25 BID.  Start hydralazine 25 TID.  Would consider sub coreg for nifedipine + metoprolol.    I will arrange f/u with myself in nephrology clinic in 1 week to review renal biopsy results.  He doesn't need to remain hospitalized for the results.  Justin Mend 09/09/2018, 1:19 PM

## 2018-09-09 NOTE — Progress Notes (Signed)
Pt cbg recheck at [phone] and PA Kelly notified; ordered to recheck again in 33mins and continue to monitor as pt asymptomatic. Will closely monitor pt. Delia Heady RN

## 2018-09-09 NOTE — Consult Note (Signed)
Chief Complaint: Patient was seen in consultation today for random renal biopsy  Referring Physician(s): Dr. Jannifer Hick  Supervising Physician: Marybelle Killings  Patient Status: [facility] - In-pt  History of Present Illness: Willie Jordan is a 53 y.o. male with a past medical history significant for DM II, neuropathy, HTN, HLD, CHF and CKD who presented to [facility] ED on 09/02/18 after falling approximately 5 feet off a ladder. He was found to have 6 rib fractures along the left lateral lungs without pneumothorax. He was admitted for observation and pain control. During further workup he was found to have pneumonia that is currently being treated with IV antibiotics as well as acute on chronic renal failure. Nephrology was consulted due to concern for cardiorenal syndrome as the cause of his acute renal failure. Request has been made to IR by nephrology for random renal biopsy in order to further evaluate his acute renal failure.  Video interpreter was used during exam as patient is Spanish speaking. Patient reports that he was told he would need a biopsy of his kidneys but he is not sure why. He states that he is concerned that his legs are continuing to swell and cause him pain and that the medications he normally takes for this issue has been stopped. He also expresses several concerns about the biopsy including that person performing the biopsy is not a student and if there is damage to his kidneys what will happen going forward. After discussion regarding risks, benefits and alternatives to renal biopsy patient is agreeable to proceed.   Past Medical History:  Diagnosis Date  . Diabetic neuropathy ([place]) 03/17/2017  . Hyperlipidemia   . Hypertension   . Microalbuminuria due to type 2 diabetes mellitus ([place]) 01/30/2017  . Neuropathy   . Onychomycosis of toenail 01/30/2017  . Peripheral neuropathy 03/17/2017  . Type 2 diabetes mellitus, uncontrolled ([place])     Past Surgical History:  Procedure  Laterality Date  . AMPUTATION Left 09/29/2016   Procedure: 2nd RAY AMPUTATION LEFT FOOT I&D LEFT FOOT;  Surgeon: Edrick Kins, DPM;  Location: [place];  Service: Podiatry;  Laterality: Left;  . APPENDECTOMY      Allergies: Aspirin  Medications: Prior to Admission medications   Medication Sig Start Date End Date Taking? Authorizing Provider  amLODipine (NORVASC) 10 MG tablet 1 tab by mouth daily Patient taking differently: 5 mg. 1 tab by mouth daily 08/04/17  Yes Mack Hook, MD  furosemide (LASIX) 40 MG tablet 1 1/2 tabs by mouth in morning only daily 04/16/18  Yes Mack Hook, MD  gabapentin (NEURONTIN) 300 MG capsule Take 1 capsule (300 mg total) by mouth 3 (three) times daily. 02/19/18  Yes Mack Hook, MD  glipiZIDE (GLUCOTROL XL) 5 MG 24 hr tablet Take 10 mg by mouth daily with breakfast.  07/09/18  Yes [provider]  ibuprofen (ADVIL,MOTRIN) 800 MG tablet Take 800 mg by mouth 4 (four) times daily. 08/11/18  Yes [provider]  Insulin Lispro Prot & Lispro (HUMALOG MIX 75/25 KWIKPEN) (75-25) 100 UNIT/ML Kwikpen Inject 26 units before breakfast and 24 units before dinner daily 08/04/17  Yes Mack Hook, MD  lisinopril (PRINIVIL,ZESTRIL) 20 MG tablet Take 0.5 tablets (10 mg total) by mouth daily. Patient taking differently: Take 20 mg by mouth daily.  04/16/18  Yes Mack Hook, MD  metFORMIN (GLUCOPHAGE-XR) 500 MG 24 hr tablet 2 tabs by mouth twice daily with meals 10/28/17  Yes Mack Hook, MD  metolazone (ZAROXOLYN) 5 MG tablet  Take 5 mg by mouth daily.   Yes [provider]  metoprolol tartrate (LOPRESSOR) 25 MG tablet Take 1 tablet (25 mg total) by mouth 2 (two) times daily. 04/19/18  Yes Mack Hook, MD  rosuvastatin (CRESTOR) 20 MG tablet 1 tab by mouth daily with dinner 04/22/18  Yes Mack Hook, MD  amoxicillin-clavulanate (AUGMENTIN) 875-125 MG tablet Take 1 tablet by mouth 2 (two) times  daily. Patient not taking: Reported on 04/22/2018 04/16/18   Mack Hook, MD  blood glucose meter kit and supplies Dispense based on patient and insurance preference. Use up to four times daily as directed. (FOR ICD-9 250.00, 250.01). 10/05/16   Patrecia Pour, MD  Cyanocobalamin (VITAMIN B 12 PO) Take 1 tablet by mouth daily.     [provider]  Insulin Pen Needle 31G X 5 MM MISC Use as directed twice daily 01/12/17   Mack Hook, MD  Omega-3 Fatty Acids (FISH OIL PO) Take 1 capsule by mouth daily.     [provider]     Family History  Problem Relation Age of Onset  . Diabetes Mother   . Diabetes Sister   . Diabetes Brother   . Cancer Maternal Grandmother     Social History   Socioeconomic History  . Marital status: Single    Spouse name: Not on file  . Number of children: Not on file  . Years of education: Not on file  . Highest education level: Not on file  Occupational History  . Occupation: Barista  . Financial resource strain: Not on file  . Food insecurity:    Worry: Not on file    Inability: Not on file  . Transportation needs:    Medical: Not on file    Non-medical: Not on file  Tobacco Use  . Smoking status: Former Smoker    Types: Cigarettes    Start date: 10/31/2015    Last attempt to quit: 01/05/2016    Years since quitting: 2.6  . Smokeless tobacco: Never Used  Substance and Sexual Activity  . Alcohol use: No    Alcohol/week: 0.0 standard drinks    Comment: rarely  . Drug use: No  . Sexual activity: Not Currently  Lifestyle  . Physical activity:    Days per week: Not on file    Minutes per session: Not on file  . Stress: Not on file  Relationships  . Social connections:    Talks on phone: Not on file    Gets together: Not on file    Attends religious service: Not on file    Active member of club or organization: Not on file    Attends meetings of clubs or organizations: Not on file    Relationship  status: Not on file  Other Topics Concern  . Not on file  Social History Narrative  . Not on file     Review of Systems: A 12 point ROS discussed and pertinent positives are indicated in the HPI above.  All other systems are negative.  Review of Systems  Constitutional: Negative for chills and fever.  Respiratory: Positive for shortness of breath. Negative for cough.   Cardiovascular: Positive for leg swelling. Negative for chest pain.  Gastrointestinal: Negative for abdominal pain, diarrhea, nausea and vomiting.  Neurological: Negative for dizziness and headaches.  Psychiatric/Behavioral: Negative for confusion.    Vital Signs: BP (!) 169/76 (BP Location: Left Arm)   Pulse 86   Temp 97.8 F (36.6 C) (  Oral)   Resp 18   Ht '5\' 6"'  (1.676 m)   Wt 185 lb 3 oz (84 kg)   SpO2 91%   BMI 29.89 kg/m   Physical Exam Vitals signs and nursing note reviewed.  Constitutional:      General: He is not in acute distress. HENT:     Head: Normocephalic.  Cardiovascular:     Rate and Rhythm: Normal rate.  Pulmonary:     Effort: Pulmonary effort is normal.  Skin:    General: Skin is warm and dry.  Neurological:     Mental Status: He is alert and oriented to person, place, and time.  Psychiatric:        Mood and Affect: Mood normal.        Behavior: Behavior normal.        Thought Content: Thought content normal.        Judgment: Judgment normal.      MD Evaluation Airway: WNL Heart: WNL Abdomen: WNL Chest/ Lungs: WNL ASA  Classification: 2 Mallampati/Airway Score: Two   Imaging: Dg Chest 2 View  Result Date: 09/05/2018 CLINICAL DATA:  Shortness of breath following fall from ladder. EXAM: CHEST - 2 VIEW COMPARISON:  09/03/2018 chest radiograph, 09/02/2018 chest CT and prior studies FINDINGS: Cardiomegaly and mild bibasilar atelectasis, LEFT-greater-than-RIGHT, again noted. No new pulmonary opacities are present. No evidence of pneumothorax or large pleural effusion. Several  LEFT rib fractures are again identified. IMPRESSION: No significant change from 09/03/2018. Bibasilar atelectasis and LEFT rib fractures again noted without pneumothorax. Electronically Signed   By: Margarette Canada M.D.   On: 09/05/2018 13:05   Dg Chest 2 View  Result Date: 09/03/2018 CLINICAL DATA:  53 year old male status post fall with multiple left-sided rib fractures demonstrated by CT yesterday. EXAM: CHEST - 2 VIEW COMPARISON:  Chest CTA and radiographs 09/02/2018 and earlier. FINDINGS: Upright AP and lateral views of the chest. Posterior and lateral multilevel left rib fractures were better demonstrated by CT. Lung volumes remain low with increased left greater than right patchy opacity since yesterday. No pneumothorax, pulmonary edema, or pleural effusion identified. Stable cardiac size and mediastinal contours. Visualized tracheal air column is within normal limits. Negative visible bowel gas pattern. IMPRESSION: 1. Continued low lung volumes with increased left greater than right atelectasis since yesterday. No pneumothorax or pleural effusion. 2. Multiple posterior and lateral left rib fractures better demonstrated by CT. Electronically Signed   By: Genevie Ann M.D.   On: 09/03/2018 11:27   Dg Ribs Unilateral W/chest Left  Result Date: 09/02/2018 CLINICAL DATA:  Pain following fall EXAM: LEFT RIBS AND CHEST - 3+ VIEW COMPARISON:  Chest radiograph July 26, 2017 FINDINGS: Frontal chest as well as oblique and cone-down rib images were obtained. There is no appreciable edema or consolidation. There is slight atelectatic change in the bases. Heart size and pulmonary vascularity are within normal limits. No adenopathy. There are fractures of the anterolateral left third, fourth, fifth, sixth, seventh, and eighth ribs with fracture fragments in overall near anatomic alignment. There is no evident pneumothorax or appreciable pleural effusion. IMPRESSION: Multiple rib fractures on the left without pneumothorax  or pleural effusion. There is left base atelectasis. There is also slight right base atelectasis. No consolidation. Heart size normal. Electronically Signed   By: Lowella Grip III M.D.   On: 09/02/2018 14:12   Dg Elbow Complete Left  Result Date: 09/02/2018 CLINICAL DATA:  Pain following fall EXAM: LEFT ELBOW - COMPLETE 3+ VIEW COMPARISON:  None. FINDINGS: Frontal, lateral, and bilateral oblique views were obtained. There is no appreciable fracture or dislocation. No evident joint effusion. There is no appreciable joint space narrowing or erosion. There is calcific tendinosis laterally near the distal humeral metaphysis. IMPRESSION: Calcific tendinosis laterally. No appreciable joint space narrowing or erosion. No evident fracture or dislocation. Electronically Signed   By: Lowella Grip III M.D.   On: 09/02/2018 14:13   Ct Chest W Contrast  Result Date: 09/02/2018 CLINICAL DATA:  Multiple rib fractures.  Status post fall. EXAM: CT CHEST WITH CONTRAST TECHNIQUE: Multidetector CT imaging of the chest was performed during intravenous contrast administration. CONTRAST:  18m OMNIPAQUE IOHEXOL 300 MG/ML  SOLN COMPARISON:  Current chest and rib radiographs and prior chest radiographs. FINDINGS: Cardiovascular: Heart is top-normal in size. No pericardial effusion. Mild three-vessel coronary artery calcifications. Mild aortic atherosclerotic calcifications. Great vessels are normal in caliber. Mediastinum/Nodes: No neck base or axillary masses or enlarged lymph nodes. There are several prominent mediastinal lymph nodes, largest a right paratracheal node, 12 mm in short axis. No mediastinal or hilar masses. No hilar lymphadenopathy. Trachea and esophagus are unremarkable. Lungs/Pleura: No pneumothorax. No pleural effusion and/or hemothorax. There is linear and patchy opacity in the inferior, posterolateral left upper lobe, lingula and adjacent lateral left lower lobe, consistent with atelectasis. There is  mild posterior subsegmental atelectasis at the base of the left lower lobe, minimally seen on the right as well. Remainder of the lungs is clear. Upper Abdomen: No acute findings. No evidence of injury on this unenhanced study. Musculoskeletal: There are multiple left-sided rib fractures, nondisplaced. There are bilateral fractures of the left third, fourth, fifth and sixth ribs and likely the seventh rib. There are posterior fractures of the left fourth through ninth ribs. No other fractures.  No bone lesions. IMPRESSION: 1. Multiple left-sided rib fractures as described above, nondisplaced, with no fracture complication. Specifically, no evidence of a lung laceration, pneumothorax or hemothorax. 2. There is mild atelectasis in the left lateral lower lung adjacent to rib fractures. 3. No other acute finding within the chest. 4. Aortic atherosclerosis. Aortic Atherosclerosis (ICD10-I70.0). Electronically Signed   By: DLajean ManesM.D.   On: 09/02/2018 17:38   [place]orea[place]al  Result Date: 09/08/2018 CLINICAL DATA:  Acute renal failure. Fell from a ladder on 09/02/2018. EXAM: RENAL / URINARY TRACT ULTRASOUND COMPLETE COMPARISON:  09/01/2018. FINDINGS: Right Kidney: Renal measurements: 12.7 x 5.6 x 5.3 cm = volume: 199 mL. Borderline increased in echogenicity. No mass or hydronephrosis. Left Kidney: Renal measurements: 12.9 x 6.6 x 5.3 cm = volume: 240 mL. Borderline increased in echogenicity. No mass or hydronephrosis. Bladder: Appears normal for degree of bladder distention. IMPRESSION: Borderline increased echogenicity of both kidneys, suggesting minimal changes of medical renal disease. Otherwise, normal examination. Electronically Signed   By: SClaudie ReveringM.D.   On: 09/08/2018 20:05   [place]al  Result Date: 09/02/2018 CLINICAL DATA:  Stage 3 chronic kidney disease. EXAM: RENAL / URINARY TRACT ULTRASOUND COMPLETE COMPARISON:  May 25, 2007 FINDINGS: Right Kidney: Renal measurements: 12.3 x 4.6 x 5.6 cm =  volume: 168 mL . Echogenicity within normal limits. No mass or hydronephrosis visualized. Left Kidney: Renal measurements: 12.8 x 6.6 x 5.9 cm = volume: 262 mL. Echogenicity within normal limits. No mass or hydronephrosis visualized. Bladder: Appears normal for degree of bladder distention. IMPRESSION: Normal renal ultrasound. Electronically Signed   By: WAbelardo DieselM.D.   On: 09/02/2018 09:42   [facility]  Result Date: 09/07/2018 CLINICAL DATA:  Follow-up pneumothorax. Short of breath. Left rib fractures. EXAM: PORTABLE CHEST 1 VIEW COMPARISON:  09/05/2018 and older exams. FINDINGS: There is by basilar opacity mildly increased when compared to the most recent prior exam, consistent with atelectasis. Suspect small associated left pleural effusion. Remainder of the lungs is clear. No pneumothorax. IMPRESSION: 1. Mild increase in lung base atelectasis since the most recent prior study. Probable small left pleural effusion. 2. No pneumothorax.  No pulmonary edema. Electronically Signed   By: Lajean Manes M.D.   On: 09/07/2018 09:21    Labs:  CBC: Recent Labs    09/06/18 0949 09/07/18 0348 09/08/18 0457 09/09/18 0327  WBC 14.7* 11.1* 10.7* 5.7  HGB 9.9* 8.6* 8.7* 9.9*  HCT 31.4* 26.7* 27.1* 29.9*  PLT 180 138* 171 188    COAGS: No results for input(s): INR, APTT in the last 8760 hours.  BMP: Recent Labs    09/07/18 1558 09/08/18 0457 09/08/18 1544 09/09/18 0327  NA 132* 134* 134* 132*  K 3.5 3.4* 3.4* 3.7  CL 100 102 99 97*  CO2 '23 23 25 24  ' GLUCOSE 210* 121* 261* 338*  BUN 57* 57* 58* 61*  CALCIUM 8.1* 8.2* 8.4* 8.3*  CREATININE 3.31* 2.94* 2.89* 3.08*  GFRNONAA 21* 24* 24* 22*  GFRAA 24* 27* 28* 26*    LIVER FUNCTION TESTS: Recent Labs    10/13/17 0912 01/21/18 0845 04/16/18 1114  BILITOT 0.2 0.2 <0.2  AST 37 70* 22  ALT 39 38 24  ALKPHOS 117 121* 117  PROT 6.1 6.1 6.0  ALBUMIN 3.1* 3.1* 2.8*    TUMOR MARKERS: No results for input(s): AFPTM, CEA,  CA199, CHROMGRNA in the last 8760 hours.  Assessment and Plan:  53 y/o M with history of DM, HTN, CKD who presented to [facility] ED on 2/27 after fall from ladder resulting in 6 left lateral rib fractures. During admission he was noted to have acute renal failure and nephrology was consulted - they have requested a random renal biopsy to be performed in IR to further direct management.  Will tentatively plan for random renal biopsy 3/6 in ultrasound. Patient to be NPO after midnight, no heparin/lovenox until post procedure, AM labs have been ordered. Please call with any questions or concerns.  Risks and benefits of random renal biopsy was discussed with the patient and/or patient's family including, but not limited to bleeding, infection, damage to adjacent structures or low yield requiring additional tests.  All of the questions were answered and there is agreement to proceed.  Consent signed and in chart.   Thank you for this interesting consult.  I greatly enjoyed meeting Linwood Gullikson and look forward to participating in their care.  A copy of this report was sent to the requesting provider on this date.  Electronically Signed: Joaquim Nam, PA-C 09/09/2018, 2:17 PM   I spent a total of 20 Minutes in face to face in clinical consultation, greater than 50% of which was counseling/coordinating care for random renal biopsy.

## 2018-09-09 NOTE — Progress Notes (Signed)
Occupational Therapy Treatment Patient Details Name: Willie Jordan MRN: [id number] DOB: 05/20/1966 Today's Date: 09/09/2018    History of present illness Patient is 53 y/o male admitted to [place] following a fall from a ladder. Patient with multiple posterior and lateral L rib fxs (3-9). Elbow x-ray negative. PMH includes HTN, DMII, HLD, and peripheral neuropathy.    OT comments  Pt progressing towards acute OT goals. Received on RA after having just returning to bed from bathroom. Sats at start session and after OOB activity 88-90. Rechecked after a few minutes resting in bed at end of session and O2 on RA 92. Discussed breathing techniques during activity and UB dressing technique. Noted to seek single extremity support of rails/furniture while ambulating but declined use of cane. D/c plan remains appropriate.    Follow Up Recommendations  No OT follow up;Supervision - Intermittent    Equipment Recommendations  None recommended by OT    Recommendations for Other Services      Precautions / Restrictions Precautions Precautions: Fall Required Braces or Orthoses: Sling(LUE for comfort)       Mobility Bed Mobility Overal bed mobility: Needs Assistance Bed Mobility: Supine to Sit;Sit to Supine     Supine to sit: Supervision Sit to supine: Supervision   General bed mobility comments: HOB at about 30*, pt unable to tolerate flat HOB this session; plans to use pillows to boost HOB height at home.   Transfers Overall transfer level: Needs assistance Equipment used: None Transfers: Sit to/from Stand Sit to Stand: Min guard         General transfer comment: min guard for safety; no physical assist needed    Balance Overall balance assessment: Needs assistance Sitting-balance support: No upper extremity supported;Feet supported Sitting balance-Leahy Scale: Good     Standing balance support: No upper extremity supported Standing balance-Leahy Scale: Fair Standing  balance comment: No LOB in standing. seeks single extremity support while walking but declines use of cane                           ADL either performed or assessed with clinical judgement   ADL Overall ADL's : Needs assistance/impaired                         Toilet Transfer: Supervision/safety;Comfort height toilet           Functional mobility during ADLs: Min guard General ADL Comments: Pt completed household distance functional mobility, navigating obstaces and turns. Noted to seek single extremity support but declined using cane. Educated on UB dressing technique and breathing techniques during activity     Vision       Perception     Praxis      Cognition Arousal/Alertness: Awake/alert Behavior During Therapy: WFL for tasks assessed/performed Overall Cognitive Status: Within Functional Limits for tasks assessed                                          Exercises     Shoulder Instructions       General Comments      Pertinent Vitals/ Pain       Pain Assessment: Faces Faces Pain Scale: Hurts little more Pain Location: L ribs and L UE with certain movements Pain Descriptors / Indicators: Aching;Grimacing;Guarding Pain Intervention(s): Monitored during session;Limited activity within patient's tolerance;Repositioned  Home Living                                          Prior Functioning/Environment              Frequency  Min 2X/week        Progress Toward Goals  OT Goals(current goals can now be found in the care plan section)  Progress towards OT goals: Progressing toward goals  Acute Rehab OT Goals Patient Stated Goal: "have less pain" ADL Goals Pt Will Perform Upper Body Dressing: with modified independence Pt Will Transfer to Toilet: with modified independence Pt Will Perform Toileting - Clothing Manipulation and hygiene: with modified independence Additional ADL Goal #1: Pt will  perform ADLs with modified independence and AE as needed.  Plan Discharge plan remains appropriate    Co-evaluation                 AM-PAC OT "6 Clicks" Daily Activity     Outcome Measure   Help from another person eating meals?: None Help from another person taking care of personal grooming?: None Help from another person toileting, which includes using toliet, bedpan, or urinal?: None Help from another person bathing (including washing, rinsing, drying)?: A Little Help from another person to put on and taking off regular upper body clothing?: None Help from another person to put on and taking off regular lower body clothing?: A Little 6 Click Score: 22    End of Session    OT Visit Diagnosis: Unsteadiness on feet (R26.81);Muscle weakness (generalized) (M62.81)   Activity Tolerance Patient tolerated treatment well(reported mild dizziness upon inital stand)   Patient Left in bed;with call bell/phone within reach   Nurse Communication          Time: 2458-0998 OT Time Calculation (min): 22 min  Charges: OT General Charges $OT Visit: 1 Visit OT Treatments $Self Care/Home Management : 8-22 mins  Tyrone Schimke, OT Acute Rehabilitation Services Pager: [phone] Office: [phone]    Hortencia Pilar 09/09/2018, 10:24 AM

## 2018-09-09 NOTE — Progress Notes (Signed)
Notified PA of pt CBG and condition. Pt is asymptomatic. Reviewed pt diabetic meds/time of administration. New orders obtained and noted. Administered 20u of novolog now per orders. Report given to oncoming nurse.

## 2018-09-09 NOTE — Progress Notes (Signed)
[facility] Progress Note     Subjective: CC: no new complaints Patient reports pain is overall improved. Tolerating diet and did have some bowel movements yesterday. Using IS and flutter valve. Legs still swollen.   Objective: Vital signs in last 24 hours: Temp:  [98 F (36.7 C)-98.3 F (36.8 C)] 98.3 F (36.8 C) (03/05 0401) Pulse Rate:  [68-81] 79 (03/05 0401) Resp:  [15-18] 15 (03/05 0401) BP: (142-155)/(71-76) 153/71 (03/05 0401) SpO2:  [92 %-95 %] 94 % (03/05 0401) Last BM Date: 09/08/18  Intake/Output from previous day: 03/04 0701 - 03/05 0700 In: 1320 [P.O.:720; IV Piggyback:600] Out: 700 [Urine:700] Intake/Output this shift: No intake/output data recorded.  PE: Gen: Alert, NAD, pleasant Card: Regular rate and rhythm, 3+ edema in bilateral LEs Pulm:normal effort, sats remained in the 90s without supplemental O2,lungs CTAB, pulled 1500 on IS Abd: Soft, non-tender, non-distended,+BS Skin: warm and dry, no rashes  Psych: A&Ox3   Lab Results:  Recent Labs    09/08/18 0457 09/09/18 0327  WBC 10.7* 5.7  HGB 8.7* 9.9*  HCT 27.1* 29.9*  PLT 171 188   BMET Recent Labs    09/08/18 1544 09/09/18 0327  NA 134* 132*  K 3.4* 3.7  CL 99 97*  CO2 25 24  GLUCOSE 261* 338*  BUN 58* 61*  CREATININE 2.89* 3.08*  CALCIUM 8.4* 8.3*   PT/INR No results for input(s): LABPROT, INR in the last 72 hours. CMP     Component Value Date/Time   NA 132 (L) 09/09/2018 0327   NA 137 04/19/2018 1256   K 3.7 09/09/2018 0327   CL 97 (L) 09/09/2018 0327   CO2 24 09/09/2018 0327   GLUCOSE 338 (H) 09/09/2018 0327   BUN 61 (H) 09/09/2018 0327   BUN 34 (H) 04/19/2018 1256   CREATININE 3.08 (H) 09/09/2018 0327   CALCIUM 8.3 (L) 09/09/2018 0327   PROT 6.0 04/16/2018 1114   ALBUMIN 2.8 (L) 04/16/2018 1114   AST 22 04/16/2018 1114   ALT 24 04/16/2018 1114   ALKPHOS 117 04/16/2018 1114   BILITOT <0.2 04/16/2018 1114   GFRNONAA 22 (L) 09/09/2018 0327    GFRAA 26 (L) 09/09/2018 0327   Lipase     Component Value Date/Time   LIPASE 150 (H) 05/24/2007 1515       Studies/Results: US Renal  Result Date: 09/08/2018 CLINICAL DATA:  Acute renal failure. Fell from a ladder on 09/02/2018. EXAM: RENAL / URINARY TRACT ULTRASOUND COMPLETE COMPARISON:  09/01/2018. FINDINGS: Right Kidney: Renal measurements: 12.7 x 5.6 x 5.3 cm = volume: 199 mL. Borderline increased in echogenicity. No mass or hydronephrosis. Left Kidney: Renal measurements: 12.9 x 6.6 x 5.3 cm = volume: 240 mL. Borderline increased in echogenicity. No mass or hydronephrosis. Bladder: Appears normal for degree of bladder distention. IMPRESSION: Borderline increased echogenicity of both kidneys, suggesting minimal changes of medical renal disease. Otherwise, normal examination. Electronically Signed   By: Claudie Revering M.D.   On: 09/08/2018 20:05    Anti-infectives: Anti-infectives (From admission, onward)   Start     Dose/Rate Route Frequency Ordered Stop   09/10/18 1800  vancomycin (VANCOCIN) 1,250 mg in sodium chloride 0.9 % 250 mL IVPB     1,250 mg 166.7 mL/hr over 90 Minutes Intravenous Every 48 hours 09/08/18 1615     09/08/18 1700  vancomycin (VANCOCIN) 2,000 mg in sodium chloride 0.9 % 500 mL IVPB     2,000 mg 250 mL/hr over 120 Minutes Intravenous NOW 09/08/18 1615  09/09/18 0401   09/06/18 1045  ceFEPIme (MAXIPIME) 1 g in sodium chloride 0.9 % 100 mL IVPB     1 g 200 mL/hr over 30 Minutes Intravenous Every 24 hours 09/06/18 1010         Assessment/Plan Fall from ladder L ribs 3-9 fractures (some segmental)- multimodal pain control, IS, pulmonary toilet - flutter valve - BID guaifenesin, scheduled nebs HTN- home meds T2DM-SSI AKI on CKDstage III- Cr3.08 today, appreciate TRH assistance, renal US fairly stable, ?nephrology consult Hx of chronic diastolic CHF - echo today Possible PNA- cefepime started 3/2, WBC 5, afebrile last 24h, will d/c abx  tomorrow  FEN: CM diet, continue bowel regimen VTE: SCDs,SQ heparin ID: cefepime 3/2>3/6  Dispo:Wean O2.Continue PT/OT. Possible nephrology consult for AKI on CKD  LOS: 6 days    Brigid Re , [facility] Surgery 09/09/2018, 8:31 AM Pager: [phone]

## 2018-09-09 NOTE — Progress Notes (Signed)
PROGRESS NOTE    Willie Jordan  SAY:[id number] DOB: Jul 20, 1965 DOA: 09/02/2018 PCP: Vassie Moment, MD (Confirm with patient/family/NH records and if not entered, this HAS to be entered at [facility] point of entry. "No PCP" if truly none.)   Brief Narrative: (Start on day 1 of progress note - keep it brief and live) Willie Jordan is an 53 y.o. male with PMH significant for hypertension, hyperlipidemia, diabetes mellitus, peripheral neuropathy, CKD-2, and grade 2 diastolic CHF on echo in 2/35 who presented on 2/27 after a fall of about 5 feet from a ladder.  He was found to have multiple rib fractures and admitted to the trauma service.  He requires O2 or he feels tired and his vision looks dark.  Has been having cough with productive sputum.  Patient is started on cefepime.  Patient is also found to have acute renal failure.  Patient states has history of chronic renal insufficiency, does not remember baseline creatinine Assessment & Plan:   Principal Problem:   Multiple fractures of ribs, left side, initial encounter for closed fracture Active Problems:   HLD (hyperlipidemia)   Essential hypertension   Diabetes mellitus ([place])   Acute respiratory failure with hypoxemia (HCC)   Acute renal failure superimposed on stage 2 chronic kidney disease (HCC)   Chronic diastolic CHF (congestive heart failure) (HCC)   Anemia due to chronic kidney disease   ##Acute hypoxic respiratory failure -Secondary to pneumonia, multiple rib fractures, pain, COPD exacerbation, congestive heart failure -Continue with oxygen through nasal cannula -Good improvement  ##Pneumonia -Secondary to atelectasis, secondary infection -Keep the patient on vancomycin, cefepime -Good improvement  ##COPD exacerbation -Duo nebs, Solu-Medrol -Improved cough, shortness of breath  ##Acute on chronic renal failure -Patient states has known history of chronic kidney disease -Concern about cardiorenal syndrome -We do not have  patient's baseline -Get CPK level -Ultrasound of the kidneys with no hydronephrosis, medical renal disease -Urinalysis -Nephrology consult   ##Acute on chronic diastolic congestive heart failure -Patient has significant edema in upper and lower extremities -Get echocardiogram and pending -Good improvement with anasarca  ##Diabetes mellitus with hyperglycemia -Hemoglobin A1c 7.1 -Holding oral medications and Glucotrol -Currently on sliding scale insulin -Add Lantus 15 units daily while on the steroids  ##Multiple rib fractures bilateral -Continue with pain management as needed  ##Hypertension -Continue with metoprolol  ##Hyperlipidemia -Patient had lipid profile done in October 2019 showed cholesterol 218 HDL 36 LDL 142, triglycerides 200 -Continue with Crestor  ##Anemia of chronic kidney disease -Get iron-normal, low iron binding capacity, B12-460, folate RBC, reticulocyte count -Hemoglobin trending down  DVT prophylaxis: SCDs Code Status: (Full Family Communication: Patient Disposition Plan: Based on physical therapy evaluation  Procedures:  CT chest on 09/02/2018- 1. Multiple left-sided rib fractures as described above, nondisplaced, with no fracture complication. Specifically, no evidence of a lung laceration, pneumothorax or hemothorax.  Antimicrobials:  Vancomycin-09/08/2018 Cefepime 09/06/2018  Subjective: Complaining of cough with productive greenish sputum, shortness of breath, chest pain, bilateral increased edema  Objective: Vitals:   09/08/18 2121 09/09/18 0401 09/09/18 0853 09/09/18 1120  BP: (!) 149/71 (!) 153/71  (!) 169/76  Pulse: 81 79  86  Resp:  15  18  Temp:  98.3 F (36.8 C)  97.8 F (36.6 C)  TempSrc:  Oral  Oral  SpO2:  94% 92% 91%  Weight:      Height:        Intake/Output Summary (Last 24 hours) at 09/09/2018 1524 Last data filed at 09/09/2018 1234 Gross  per 24 hour  Intake 600 ml  Output 500 ml  Net 100 ml   Filed Weights    09/06/18 0416  Weight: 84 kg    Examination:  General exam: Appears calm and comfortable  Respiratory system: Bilateral wheezing, decreased breath sounds on the left side of the chest Cardiovascular system: S1 & S2 heard, RRR. No JVD, murmurs, rubs, gallops or clicks. No pedal edema. Gastrointestinal system: Abdomen is nondistended, soft and nontender. No organomegaly or masses felt. Normal bowel sounds heard. Central nervous system: Alert and oriented. No focal neurological deficits. Extremities: Symmetric 5 x 5 power.  Anasarca Skin: No rashes, lesions or ulcers Psychiatry: Judgement and insight appear normal. Mood & affect appropriate.     Data Reviewed: I have personally reviewed following labs and imaging studies  CBC: Recent Labs  Lab 09/05/18 1021 09/06/18 0949 09/07/18 0348 09/08/18 0457 09/09/18 0327  WBC 9.4 14.7* 11.1* 10.7* 5.7  HGB 11.2* 9.9* 8.6* 8.7* 9.9*  HCT 34.3* 31.4* 26.7* 27.1* 29.9*  MCV 81.3 81.8 82.4 81.9 79.9*  PLT 189 180 138* 171 124   Basic Metabolic Panel: Recent Labs  Lab 09/07/18 0348 09/07/18 1558 09/08/18 0457 09/08/18 1544 09/09/18 0327  NA 135 132* 134* 134* 132*  K 3.6 3.5 3.4* 3.4* 3.7  CL 103 100 102 99 97*  CO2 21* 23 23 25 24   GLUCOSE 134* 210* 121* 261* 338*  BUN 54* 57* 57* 58* 61*  CREATININE 3.19* 3.31* 2.94* 2.89* 3.08*  CALCIUM 7.8* 8.1* 8.2* 8.4* 8.3*   GFR: Estimated Creatinine Clearance: 29.2 mL/min (A) (by C-G formula based on SCr of 3.08 mg/dL (H)). Liver Function Tests: No results for input(s): AST, ALT, ALKPHOS, BILITOT, PROT, ALBUMIN in the last 168 hours. No results for input(s): LIPASE, AMYLASE in the last 168 hours. No results for input(s): AMMONIA in the last 168 hours. Coagulation Profile: No results for input(s): INR, PROTIME in the last 168 hours. Cardiac Enzymes: Recent Labs  Lab 09/08/18 1641  CKTOTAL 186   BNP (last 3 results) No results for input(s): PROBNP in the last 8760  hours. HbA1C: No results for input(s): HGBA1C in the last 72 hours. CBG: Recent Labs  Lab 09/08/18 2107 09/09/18 0824 09/09/18 1251 09/09/18 1330 09/09/18 1451  GLUCAP 205* 283* 425* 411* 370*   Lipid Profile: No results for input(s): CHOL, HDL, LDLCALC, TRIG, CHOLHDL, LDLDIRECT in the last 72 hours. Thyroid Function Tests: No results for input(s): TSH, T4TOTAL, FREET4, T3FREE, THYROIDAB in the last 72 hours. Anemia Panel: Recent Labs    09/08/18 1641  VITAMINB12 460  TIBC 211*  IRON 20*  RETICCTPCT 1.4   Sepsis Labs: Recent Labs  Lab 09/07/18 1106  PROCALCITON 2.02    Recent Results (from the past 240 hour(s))  MRSA PCR Screening     Status: None   Collection Time: 09/02/18  8:46 PM  Result Value Ref Range Status   MRSA by PCR NEGATIVE NEGATIVE Final    Comment:        The GeneXpert MRSA Assay (FDA approved for NASAL specimens only), is one component of a comprehensive MRSA colonization surveillance program. It is not intended to diagnose MRSA infection nor to guide or monitor treatment for MRSA infections. Performed at [facility], [place] [address], [place], [place] [address]          Radiology Studies: US Renal  Result Date: 09/08/2018 CLINICAL DATA:  Acute renal failure. Fell from a ladder on 09/02/2018. EXAM: RENAL /  URINARY TRACT ULTRASOUND COMPLETE COMPARISON:  09/01/2018. FINDINGS: Right Kidney: Renal measurements: 12.7 x 5.6 x 5.3 cm = volume: 199 mL. Borderline increased in echogenicity. No mass or hydronephrosis. Left Kidney: Renal measurements: 12.9 x 6.6 x 5.3 cm = volume: 240 mL. Borderline increased in echogenicity. No mass or hydronephrosis. Bladder: Appears normal for degree of bladder distention. IMPRESSION: Borderline increased echogenicity of both kidneys, suggesting minimal changes of medical renal disease. Otherwise, normal examination. Electronically Signed   By: Claudie Revering M.D.   On: 09/08/2018 20:05        Scheduled  Meds: . acetaminophen  1,000 mg Oral Q6H  . amLODipine  5 mg Oral Daily  . docusate sodium  100 mg Oral BID  . furosemide  40 mg Intravenous BID  . guaiFENesin  5 mL Oral BID  . insulin aspart  0-20 Units Subcutaneous TID WC  . insulin aspart  0-5 Units Subcutaneous QHS  . insulin aspart protamine- aspart  18 Units Subcutaneous BID WC  . lidocaine  1 patch Transdermal Q24H  . methocarbamol  750 mg Oral TID  . metolazone  5 mg Oral Daily  . metoprolol tartrate  25 mg Oral BID  . polyethylene glycol  17 g Oral Daily  . rosuvastatin  20 mg Oral q1800   Continuous Infusions: . ceFEPime (MAXIPIME) IV 1 g (09/09/18 1240)  . [START ON 09/10/2018] vancomycin       LOS: 6 days    Time spent: 40 minutes   Trine Fread, MD [facility] Pager [phone]  If 7PM-7AM, please contact night-coverage [email or web address] Password [email or web address] 09/09/2018, 3:24 PM

## 2018-09-09 NOTE — Progress Notes (Signed)
  Echocardiogram 2D Echocardiogram has been performed.  Willie Jordan 09/09/2018, 4:20 PM

## 2018-09-09 NOTE — Progress Notes (Signed)
Results for Willie Jordan, Willie Jordan (MRN [id number]) as of 09/09/2018 13:09  Ref. Range 09/08/2018 11:45 09/08/2018 17:28 09/08/2018 21:07 09/09/2018 08:24 09/09/2018 12:51  Glucose-Capillary Latest Ref Range: 70 - 99 mg/dL 232 (H) 251 (H) 205 (H) 283 (H) 425 (H)  Noted that blood sugar at noon today was 425 mg/dl. CBGs have been greater than 180 mg/dl.  Recommend increasing 70/30 insulin to 18 units BID. May need to titrate dosages as needed. Continue Novolog RESISTANT correction scale TID & HS.    Harvel Ricks RN BSN CDE Diabetes Coordinator Pager: [phone]  8am-5pm

## 2018-09-10 ENCOUNTER — Inpatient Hospital Stay (HOSPITAL_COMMUNITY): Payer: Self-pay

## 2018-09-10 LAB — CBC
HEMATOCRIT: 29.4 % — AB (ref 39.0–52.0)
Hemoglobin: 9.5 g/dL — ABNORMAL LOW (ref 13.0–17.0)
MCH: 25.6 pg — ABNORMAL LOW (ref 26.0–34.0)
MCHC: 32.3 g/dL (ref 30.0–36.0)
MCV: 79.2 fL — ABNORMAL LOW (ref 80.0–100.0)
Platelets: 241 10*3/uL (ref 150–400)
RBC: 3.71 MIL/uL — ABNORMAL LOW (ref 4.22–5.81)
RDW: 12.7 % (ref 11.5–15.5)
WBC: 12.8 10*3/uL — AB (ref 4.0–10.5)
nRBC: 0 % (ref 0.0–0.2)

## 2018-09-10 LAB — GLUCOSE, CAPILLARY
Glucose-Capillary: 137 mg/dL — ABNORMAL HIGH (ref 70–99)
Glucose-Capillary: 142 mg/dL — ABNORMAL HIGH (ref 70–99)
Glucose-Capillary: 233 mg/dL — ABNORMAL HIGH (ref 70–99)

## 2018-09-10 LAB — LIPID PANEL
Cholesterol: 213 mg/dL — ABNORMAL HIGH (ref 0–200)
HDL: 30 mg/dL — ABNORMAL LOW (ref >40)
LDL Cholesterol: 129 mg/dL — ABNORMAL HIGH (ref 0–99)
Total CHOL/HDL Ratio: 7.1 RATIO
Triglycerides: 269 mg/dL — ABNORMAL HIGH (ref <150)
VLDL: 54 mg/dL — ABNORMAL HIGH (ref 0–40)

## 2018-09-10 LAB — BASIC METABOLIC PANEL
Anion gap: 10 (ref 5–15)
BUN: 77 mg/dL — ABNORMAL HIGH (ref 6–20)
CO2: 25 mmol/L (ref 22–32)
Calcium: 8.6 mg/dL — ABNORMAL LOW (ref 8.9–10.3)
Chloride: 99 mmol/L (ref 98–111)
Creatinine, Ser: 2.98 mg/dL — ABNORMAL HIGH (ref 0.61–1.24)
GFR calc Af Amer: 27 mL/min — ABNORMAL LOW (ref >60)
GFR calc non Af Amer: 23 mL/min — ABNORMAL LOW (ref >60)
Glucose, Bld: 133 mg/dL — ABNORMAL HIGH (ref 70–99)
Potassium: 3.1 mmol/L — ABNORMAL LOW (ref 3.5–5.1)
Sodium: 134 mmol/L — ABNORMAL LOW (ref 135–145)

## 2018-09-10 LAB — PROTIME-INR
INR: 1 (ref 0.8–1.2)
Prothrombin Time: 12.8 seconds (ref 11.4–15.2)

## 2018-09-10 LAB — HIV ANTIBODY (ROUTINE TESTING W REFLEX): HIV Screen 4th Generation wRfx: NONREACTIVE

## 2018-09-10 LAB — RPR: RPR Ser Ql: NONREACTIVE

## 2018-09-10 MED ORDER — OXYCODONE HCL 5 MG PO TABS: 5.0000 mg | ORAL_TABLET | Freq: Four times a day (QID) | ORAL | 0 refills | Status: AC | PRN

## 2018-09-10 MED ORDER — IBUPROFEN 200 MG PO TABS: 600.0000 mg | ORAL_TABLET | Freq: Four times a day (QID) | ORAL | Status: DC | PRN

## 2018-09-10 MED ORDER — FENTANYL CITRATE (PF) 100 MCG/2ML IJ SOLN
INTRAMUSCULAR | Status: AC
  Filled 2018-09-10: qty 2

## 2018-09-10 MED ORDER — POTASSIUM CHLORIDE CRYS ER 20 MEQ PO TBCR
40.0000 meq | EXTENDED_RELEASE_TABLET | Freq: Two times a day (BID) | ORAL | Status: DC
  Administered 2018-09-10: 40 meq via ORAL
  Filled 2018-09-10: qty 2

## 2018-09-10 MED ORDER — FENTANYL CITRATE (PF) 100 MCG/2ML IJ SOLN
INTRAMUSCULAR | Status: AC | PRN
  Administered 2018-09-10 (×2): 50 ug via INTRAVENOUS

## 2018-09-10 MED ORDER — MIDAZOLAM HCL 2 MG/2ML IJ SOLN
INTRAMUSCULAR | Status: AC
  Filled 2018-09-10: qty 2

## 2018-09-10 MED ORDER — POTASSIUM CHLORIDE CRYS ER 20 MEQ PO TBCR: 20.0000 meq | EXTENDED_RELEASE_TABLET | Freq: Every day | ORAL | 0 refills | Status: DC

## 2018-09-10 MED ORDER — SODIUM CHLORIDE 0.9 % IV SOLN
INTRAVENOUS | Status: AC | PRN
  Administered 2018-09-10: 10 mL/h via INTRAVENOUS

## 2018-09-10 MED ORDER — MIDAZOLAM HCL 2 MG/2ML IJ SOLN
INTRAMUSCULAR | Status: AC | PRN
  Administered 2018-09-10: 2 mg via INTRAVENOUS

## 2018-09-10 MED ORDER — LIDOCAINE HCL (PF) 1 % IJ SOLN
INTRAMUSCULAR | Status: AC
  Filled 2018-09-10: qty 30

## 2018-09-10 MED ORDER — ACETAMINOPHEN 500 MG PO TABS: 1000.0000 mg | ORAL_TABLET | Freq: Three times a day (TID) | ORAL | 0 refills | Status: AC | PRN

## 2018-09-10 MED ORDER — METHOCARBAMOL 750 MG PO TABS: 750.0000 mg | ORAL_TABLET | Freq: Three times a day (TID) | ORAL | 0 refills | Status: AC | PRN

## 2018-09-10 NOTE — Progress Notes (Signed)
PT Cancellation Note  Patient Details Name: Willie Jordan MRN: [id number] DOB: 03-17-1966   Cancelled Treatment:    Reason Eval/Treat Not Completed: Patient declined, no reason specified Attempted to see pt for further gait training with SPC and to check SpO2 with mobility. Pt supine in bed and with 2.5L O2 via [place] upon arrival with SpO2 noted to be 90-93%. Pt declined participating in therapy despite pt expressing concern for balance when ambulating with SPC. If pt has not had ambulating O2 saturations taken since prior PT session on 3/04 it may be something to consider prior to d/c if pt is agreeable.    Salina April, PTA Acute Rehabilitation Services Pager: [phone] Office: [phone]   09/10/2018, 3:34 PM

## 2018-09-10 NOTE — Plan of Care (Signed)
  Problem: Education: Goal: Knowledge of General Education information will improve Description Including pain rating scale, medication(s)/side effects and non-pharmacologic comfort measures Outcome: Progressing   Problem: Clinical Measurements: Goal: Will remain free from infection Outcome: Progressing   Problem: Clinical Measurements: Goal: Respiratory complications will improve Outcome: Progressing   Problem: Safety: Goal: Ability to remain free from injury will improve Outcome: Progressing   Problem: Skin Integrity: Goal: Risk for impaired skin integrity will decrease Outcome: Progressing

## 2018-09-10 NOTE — Progress Notes (Signed)
Discharge instructions given to pt. Pt is not in distress and tolerated well.

## 2018-09-10 NOTE — Care Management Note (Signed)
Case Management Note  Patient Details  Name: Willie Jordan MRN: [id number] Date of Birth: July 14, 1965  Subjective/Objective:    Patient is 53 y/o male admitted to hosptial following a fall from a ladder. Patient with multiple posterior and lateral L rib fxs (3-9).  PTA, pt independent, lives at home alone.                 Action/Plan: PT/OT recommending no OP follow up.  PCP is [facility].    Expected Discharge Date:  09/10/18               Expected Discharge Plan:  Home/Self Care  In-House Referral:  Clinical Social Work  Discharge planning Services  CM Consult  Post Acute Care Choice:  Durable Medical Equipment Choice offered to:  Patient  DME Arranged:  Kasandra Knudsen DME Agency:  [facility]  HH Arranged:    [place] Agency:     Status of Service:  Completed, signed off  If discussed at [employer] of Stay Meetings, dates discussed:    Additional Comments:  09/10/2018 J. Lawana Hartzell, Therapist, sports, BSN Pt medically stable for home today; he states he will have no one to assist at home, but will have family to intermittently check on him.  Referral to [place] for cane.  Pt denies any other home needs.   Reinaldo Raddle, RN, BSN  Trauma/Neuro ICU Case Manager [phone]

## 2018-09-10 NOTE — Progress Notes (Signed)
Brief note: Patient is s/p kidney biopsy today morning. Noted he is being discharged today. Serul creatinine level is trending down, potassium low.  -will call pt's pharmacy for torsemide 80 mg bid, metolazone 5 mg daily and KCL 20 meq daily -repeat renal panel in a week and follow up visit will be arranged.  Sign off, please call us with question.

## 2018-09-10 NOTE — Discharge Summary (Signed)
Physician Discharge Summary  Patient ID: Willie Jordan MRN: [id number] DOB/AGE: Nov 14, 1965 53 y.o.  Admit date: 09/02/2018 Discharge date: 09/10/2018  Discharge Diagnoses Fall from ladder Left sided 3-9 rib fractures HTN Type II Diabetes Mellitus AKI on CKD stage III Chronic diastolic CHF Pneumonia  Consultants Internal medicine Nephrology Interventional radiology  Procedures 1. US guided renal biopsy - 09/10/18 Dr. Greggory Keen  HPI: This is a 53 year old male who fell about 5 feet from a ladder, landing on his left side. He presented with left rib and left elbow pain. He presented to the ED for evaluation and was found to have multiple left rib fractures. Films of left elbow negative for acute fracture. He was mildly short of breath. He lives alone. Admitted to the trauma service for pain control and pulmonary toilet.   Hospital Course: Follow up CXR on 2/28 and 3/1 showed stable low lung volumes but no pneumothorax or consolidation. Patient febrile overnight 3/1 and required increased supplemental oxygen, WBC found to be mildly elevated 3/2. Started on IV abx for presumed HCAP on 3/2, course completed 3/6. CXR 3/3 showed some increased atelectasis and pleural effusion. Internal medicine consulted for worsening kidney function and fluid overload from CHF 3/3. Renal US 3/4 showed borderline changes in echogenicity of kidneys when compared to prior study from 2/26. Nephrology consulted and recommended renal biopsy. This was completed by interventional radiology 3/6 and patient will follow up with nephrology regarding results of renal biopsy. Patient worked with therapies throughout admission who recommended no follow up but a cane as needed for mobility.   On 09/10/18 patient was tolerating a diet, voiding appropriately, pain well controlled, VSS and overall felt stable for discharge. He is discharged home in stable condition with follow up as outlined below.   I have personally looked this  patient up in the Controlled Substance Database and reviewed their medications.  Allergies as of 09/10/2018      Reactions   Aspirin Rash      Medication List    STOP taking these medications   amoxicillin-clavulanate 875-125 MG tablet Commonly known as:  Augmentin     TAKE these medications   acetaminophen 500 MG tablet Commonly known as:  TYLENOL Take 2 tablets (1,000 mg total) by mouth every 8 (eight) hours as needed for mild pain or fever.   amLODipine 10 MG tablet Commonly known as:  NORVASC 1 tab by mouth daily What changed:  how much to take   blood glucose meter kit and supplies Dispense based on patient and insurance preference. Use up to four times daily as directed. (FOR ICD-9 250.00, 250.01).   FISH OIL PO Take 1 capsule by mouth daily.   furosemide 40 MG tablet Commonly known as:  LASIX 1 1/2 tabs by mouth in morning only daily   gabapentin 300 MG capsule Commonly known as:  NEURONTIN Take 1 capsule (300 mg total) by mouth 3 (three) times daily.   glipiZIDE 5 MG 24 hr tablet Commonly known as:  GLUCOTROL XL Take 10 mg by mouth daily with breakfast.   ibuprofen 200 MG tablet Commonly known as:  ADVIL,MOTRIN Take 3 tablets (600 mg total) by mouth every 6 (six) hours as needed for mild pain. What changed:    medication strength  how much to take  when to take this  reasons to take this   Insulin Lispro Prot & Lispro (75-25) 100 UNIT/ML Kwikpen Commonly known as:  HumaLOG Mix 75/25 KwikPen Inject 26 units before  breakfast and 24 units before dinner daily   Insulin Pen Needle 31G X 5 MM Misc Use as directed twice daily   lisinopril 20 MG tablet Commonly known as:  PRINIVIL,ZESTRIL Take 0.5 tablets (10 mg total) by mouth daily. What changed:  how much to take   metFORMIN 500 MG 24 hr tablet Commonly known as:  GLUCOPHAGE-XR 2 tabs by mouth twice daily with meals   methocarbamol 750 MG tablet Commonly known as:  ROBAXIN Take 1 tablet (750  mg total) by mouth every 8 (eight) hours as needed for muscle spasms.   metolazone 5 MG tablet Commonly known as:  ZAROXOLYN Take 5 mg by mouth daily.   metoprolol tartrate 25 MG tablet Commonly known as:  LOPRESSOR Take 1 tablet (25 mg total) by mouth 2 (two) times daily.   oxyCODONE 5 MG immediate release tablet Commonly known as:  Oxy IR/ROXICODONE Take 1-2 tablets (5-10 mg total) by mouth every 6 (six) hours as needed for severe pain.   potassium chloride SA 20 MEQ tablet Commonly known as:  K-DUR,KLOR-CON Take 1 tablet (20 mEq total) by mouth daily for 7 days.   rosuvastatin 20 MG tablet Commonly known as:  Crestor 1 tab by mouth daily with dinner   VITAMIN B 12 PO Take 1 tablet by mouth daily.            Durable Medical Equipment  (From admission, onward)         Start     Ordered   09/10/18 0815  For home use only DME Cane  Once     09/10/18 9485           Follow-up Information    Vassie Moment, MD Follow up.   Specialty:  Family Medicine Why:  Call and schedule a follow up appointment to be seen in 1-2 weeks for post-hospitalization follow up and pain management of rib fractures.  Contact information: [place] [address] [phone]        [facility] Follow up.   Why:  No follow up scheduled. Call as needed.  Contact information: [address] [place] [address] [phone]       Justin Mend, MD Follow up.   Specialty:  Internal Medicine Why:  Call to schedule follow up appointment regarding renal biopsy.  Contact information: [address] [place] [address] [phone]           Signed: Brigid Re , [facility] Surgery 09/10/2018, 11:12 AM Pager: [phone]

## 2018-09-10 NOTE — Plan of Care (Signed)

## 2018-09-10 NOTE — Progress Notes (Signed)
[facility] Progress Note     Subjective: CC: no new complaints Denies SOB, pain improving. Leg swelling improving. Renal biopsy today.  Objective: Vital signs in last 24 hours: Temp:  [97.8 F (36.6 C)-97.9 F (36.6 C)] 97.9 F (36.6 C) (03/06 0511) Pulse Rate:  [62-87] 87 (03/06 0511) Resp:  [18-20] 18 (03/06 0511) BP: (126-169)/(63-76) 126/63 (03/06 0511) SpO2:  [91 %-98 %] 98 % (03/06 0511) Last BM Date: 09/08/18  Intake/Output from previous day: 03/05 0701 - 03/06 0700 In: 114.4 [IV Piggyback:114.4] Out: 750 [Urine:750] Intake/Output this shift: No intake/output data recorded.  PE: Gen: Alert, NAD, pleasant Card: Regular rate and rhythm, 3+ edema in bilateral LEs Pulm:normal effort, sats remained in the 90s without supplemental O2,lungs CTAB, pulled 1500 on IS Abd: Soft, non-tender, non-distended,+BS Skin: warm and dry, no rashes  Psych: A&Ox3   Lab Results:  Recent Labs    09/09/18 0327 09/10/18 0239  WBC 5.7 12.8*  HGB 9.9* 9.5*  HCT 29.9* 29.4*  PLT 188 241   BMET Recent Labs    09/09/18 0327 09/10/18 0239  NA 132* 134*  K 3.7 3.1*  CL 97* 99  CO2 24 25  GLUCOSE 338* 133*  BUN 61* 77*  CREATININE 3.08* 2.98*  CALCIUM 8.3* 8.6*   PT/INR Recent Labs    09/10/18 0239  LABPROT 12.8  INR 1.0   CMP     Component Value Date/Time   NA 134 (L) 09/10/2018 0239   NA 137 04/19/2018 1256   K 3.1 (L) 09/10/2018 0239   CL 99 09/10/2018 0239   CO2 25 09/10/2018 0239   GLUCOSE 133 (H) 09/10/2018 0239   BUN 77 (H) 09/10/2018 0239   BUN 34 (H) 04/19/2018 1256   CREATININE 2.98 (H) 09/10/2018 0239   CALCIUM 8.6 (L) 09/10/2018 0239   PROT 6.0 04/16/2018 1114   ALBUMIN 2.8 (L) 04/16/2018 1114   AST 22 04/16/2018 1114   ALT 24 04/16/2018 1114   ALKPHOS 117 04/16/2018 1114   BILITOT <0.2 04/16/2018 1114   GFRNONAA 23 (L) 09/10/2018 0239   GFRAA 27 (L) 09/10/2018 0239   Lipase     Component Value Date/Time   LIPASE 150 (H)  05/24/2007 1515       Studies/Results: US Renal  Result Date: 09/08/2018 CLINICAL DATA:  Acute renal failure. Fell from a ladder on 09/02/2018. EXAM: RENAL / URINARY TRACT ULTRASOUND COMPLETE COMPARISON:  09/01/2018. FINDINGS: Right Kidney: Renal measurements: 12.7 x 5.6 x 5.3 cm = volume: 199 mL. Borderline increased in echogenicity. No mass or hydronephrosis. Left Kidney: Renal measurements: 12.9 x 6.6 x 5.3 cm = volume: 240 mL. Borderline increased in echogenicity. No mass or hydronephrosis. Bladder: Appears normal for degree of bladder distention. IMPRESSION: Borderline increased echogenicity of both kidneys, suggesting minimal changes of medical renal disease. Otherwise, normal examination. Electronically Signed   By: Claudie Revering M.D.   On: 09/08/2018 20:05    Anti-infectives: Anti-infectives (From admission, onward)   Start     Dose/Rate Route Frequency Ordered Stop   09/10/18 1800  vancomycin (VANCOCIN) 1,250 mg in sodium chloride 0.9 % 250 mL IVPB  Status:  Discontinued     1,250 mg 166.7 mL/hr over 90 Minutes Intravenous Every 48 hours 09/08/18 1615 09/09/18 1554   09/09/18 2200  doxycycline (VIBRA-TABS) tablet 100 mg     100 mg Oral Every 12 hours 09/09/18 1554 09/14/18 2159   09/08/18 1700  vancomycin (VANCOCIN) 2,000 mg in sodium chloride 0.9 % 500  mL IVPB     2,000 mg 250 mL/hr over 120 Minutes Intravenous NOW 09/08/18 1615 09/09/18 0401   09/06/18 1045  ceFEPIme (MAXIPIME) 1 g in sodium chloride 0.9 % 100 mL IVPB     1 g 200 mL/hr over 30 Minutes Intravenous Every 24 hours 09/06/18 1010         Assessment/Plan Fall from ladder L ribs 3-9 fractures (some segmental)- multimodal pain control, IS, pulmonary toilet - flutter valve - BID guaifenesin, scheduled nebs HTN- home meds T2DM-SSI AKI on CKDstage III- Cr2.98today, appreciate TRH assistance, renal US fairly stable - nephrology consulted yesterday and getting renal bx today Hx of chronic diastolic CHF -  echo stable Possible PNA- cefepime started3/2, WBC 12 but pt got steroids yesterday, afebrile last 24h, will d/c IV abx   FEN: CM diet,continue bowel regimen VTE: SCDs,SQ heparin ID: cefepime 3/2>3/6; PO doxy 3/5>>  Dispo:Renal biopsy in IR today. Likely home after.   LOS: 7 days    Brigid Re , [facility] 09/10/2018, 8:10 AM Pager: [phone]

## 2018-09-10 NOTE — Progress Notes (Signed)
Pt back from sx and refuses to lie prone. Charge nurse attempted to educate pt as well, pt still refused all post sx instructions.

## 2018-09-10 NOTE — Procedures (Signed)
ARF  S/p [place] LEFT RENAL BX  NO COMP STABLE EBL MIN PATH PENDING FULL REPORT IN PACS

## 2018-09-11 ENCOUNTER — Other Ambulatory Visit: Payer: Self-pay | Admitting: Internal Medicine

## 2018-09-11 LAB — HEPATITIS C ANTIBODY: HCV Ab: <0.1 s/co ratio (ref 0.0–0.9)

## 2018-09-11 LAB — HEPATITIS B SURFACE ANTIGEN: Hepatitis B Surface Ag: NEGATIVE

## 2018-09-11 LAB — ANA W/REFLEX IF POSITIVE: Anti Nuclear Antibody(ANA): NEGATIVE

## 2018-09-11 LAB — C4 COMPLEMENT: Complement C4, Body Fluid: 60 mg/dL — ABNORMAL HIGH (ref 14–44)

## 2018-09-11 LAB — C3 COMPLEMENT: C3 Complement: 156 mg/dL (ref 82–167)

## 2018-09-13 ENCOUNTER — Other Ambulatory Visit: Payer: Self-pay | Admitting: Internal Medicine

## 2018-09-13 LAB — PROTEIN ELECTROPHORESIS, SERUM
A/G Ratio: 0.5 — ABNORMAL LOW (ref 0.7–1.7)
ALPHA-1-GLOBULIN: 0.5 g/dL — AB (ref 0.0–0.4)
Albumin ELP: 2 g/dL — ABNORMAL LOW (ref 2.9–4.4)
Alpha-2-Globulin: 1.5 g/dL — ABNORMAL HIGH (ref 0.4–1.0)
Beta Globulin: 1 g/dL (ref 0.7–1.3)
Gamma Globulin: 0.8 g/dL (ref 0.4–1.8)
Globulin, Total: 3.7 g/dL (ref 2.2–3.9)
Total Protein ELP: 5.7 g/dL — ABNORMAL LOW (ref 6.0–8.5)

## 2018-09-13 LAB — KAPPA/LAMBDA LIGHT CHAINS
Kappa free light chain: 70.5 mg/L — ABNORMAL HIGH (ref 3.3–19.4)
Kappa, lambda light chain ratio: 1.7 — ABNORMAL HIGH (ref 0.26–1.65)
LAMDA FREE LIGHT CHAINS: 41.4 mg/L — AB (ref 5.7–26.3)

## 2018-09-23 ENCOUNTER — Encounter (HOSPITAL_COMMUNITY): Payer: Self-pay | Admitting: Internal Medicine

## 2019-02-19 ENCOUNTER — Emergency Department (HOSPITAL_COMMUNITY)
Admission: EM | Admit: 2019-02-19 | Discharge: 2019-02-20 | Disposition: A | Discharge status: Home / Self Care | Payer: Self-pay | Attending: Emergency Medicine | Admitting: Emergency Medicine

## 2019-02-19 ENCOUNTER — Other Ambulatory Visit: Payer: Self-pay

## 2019-02-19 ENCOUNTER — Encounter (HOSPITAL_COMMUNITY): Payer: Self-pay | Admitting: Emergency Medicine

## 2019-02-19 DIAGNOSIS — N182 Chronic kidney disease, stage 2 (mild): Secondary | ICD-10-CM | POA: Insufficient documentation

## 2019-02-19 DIAGNOSIS — Z87891 Personal history of nicotine dependence: Secondary | ICD-10-CM | POA: Insufficient documentation

## 2019-02-19 DIAGNOSIS — I129 Hypertensive chronic kidney disease with stage 1 through stage 4 chronic kidney disease, or unspecified chronic kidney disease: Secondary | ICD-10-CM | POA: Insufficient documentation

## 2019-02-19 DIAGNOSIS — E785 Hyperlipidemia, unspecified: Secondary | ICD-10-CM | POA: Insufficient documentation

## 2019-02-19 DIAGNOSIS — E119 Type 2 diabetes mellitus without complications: Secondary | ICD-10-CM | POA: Insufficient documentation

## 2019-02-19 DIAGNOSIS — R1013 Epigastric pain: Secondary | ICD-10-CM | POA: Insufficient documentation

## 2019-02-19 DIAGNOSIS — I10 Essential (primary) hypertension: Secondary | ICD-10-CM

## 2019-02-19 DIAGNOSIS — Z79899 Other long term (current) drug therapy: Secondary | ICD-10-CM | POA: Insufficient documentation

## 2019-02-19 LAB — COMPREHENSIVE METABOLIC PANEL
ALT: 20 U/L (ref 0–44)
AST: 29 U/L (ref 15–41)
Albumin: 2 g/dL — ABNORMAL LOW (ref 3.5–5.0)
Alkaline Phosphatase: 98 U/L (ref 38–126)
Anion gap: 11 (ref 5–15)
BUN: 36 mg/dL — ABNORMAL HIGH (ref 6–20)
CO2: 22 mmol/L (ref 22–32)
Calcium: 7.9 mg/dL — ABNORMAL LOW (ref 8.9–10.3)
Chloride: 105 mmol/L (ref 98–111)
Creatinine, Ser: 4.07 mg/dL — ABNORMAL HIGH (ref 0.61–1.24)
GFR calc Af Amer: 19 mL/min — ABNORMAL LOW (ref >60)
GFR calc non Af Amer: 16 mL/min — ABNORMAL LOW (ref >60)
Glucose, Bld: 238 mg/dL — ABNORMAL HIGH (ref 70–99)
Potassium: 3.1 mmol/L — ABNORMAL LOW (ref 3.5–5.1)
Sodium: 138 mmol/L (ref 135–145)
Total Bilirubin: 0.4 mg/dL (ref 0.3–1.2)
Total Protein: 5.5 g/dL — ABNORMAL LOW (ref 6.5–8.1)

## 2019-02-19 LAB — URINALYSIS, ROUTINE W REFLEX MICROSCOPIC
Bilirubin Urine: NEGATIVE
Glucose, UA: >=500 mg/dL — AB
Ketones, ur: NEGATIVE mg/dL
Leukocytes,Ua: NEGATIVE
Nitrite: NEGATIVE
Protein, ur: >=300 mg/dL — AB
Specific Gravity, Urine: 1.015 (ref 1.005–1.030)
pH: 7 (ref 5.0–8.0)

## 2019-02-19 LAB — LIPASE, BLOOD: Lipase: 47 U/L (ref 11–51)

## 2019-02-19 LAB — CBC
HCT: 32.9 % — ABNORMAL LOW (ref 39.0–52.0)
Hemoglobin: 11 g/dL — ABNORMAL LOW (ref 13.0–17.0)
MCH: 27.4 pg (ref 26.0–34.0)
MCHC: 33.4 g/dL (ref 30.0–36.0)
MCV: 81.8 fL (ref 80.0–100.0)
Platelets: 226 10*3/uL (ref 150–400)
RBC: 4.02 MIL/uL — ABNORMAL LOW (ref 4.22–5.81)
RDW: 12.9 % (ref 11.5–15.5)
WBC: 8.8 10*3/uL (ref 4.0–10.5)
nRBC: 0 % (ref 0.0–0.2)

## 2019-02-19 MED ORDER — SODIUM CHLORIDE 0.9% FLUSH: 3.0000 mL | Freq: Once | INTRAVENOUS | Status: DC

## 2019-02-19 NOTE — ED Triage Notes (Signed)
Patient with chronic abdominal pain.  Patient denies any nausea or vomiting.  He states that he is having a hard time having a BM.  He states that he feels bloated.

## 2019-02-20 ENCOUNTER — Emergency Department (HOSPITAL_COMMUNITY): Payer: Self-pay

## 2019-02-20 MED ORDER — OMEPRAZOLE 20 MG PO CPDR: 20.0000 mg | DELAYED_RELEASE_CAPSULE | Freq: Two times a day (BID) | ORAL | 0 refills | Status: DC

## 2019-02-20 MED ORDER — ALUM & MAG HYDROXIDE-SIMETH 200-200-20 MG/5ML PO SUSP
15.0000 mL | Freq: Once | ORAL | Status: AC
  Administered 2019-02-20: 15 mL via ORAL
  Filled 2019-02-20: qty 30

## 2019-02-20 NOTE — ED Provider Notes (Signed)
[facility] EMERGENCY DEPARTMENT Provider Note   CSN: [id number] Arrival date & time: 02/19/19  2047    History   Chief Complaint Chief Complaint  Patient presents with  . Abdominal Pain    HPI Uno Esau is a 53 y.o. male.     HPI  This is a 53 year old male with a history of hypertension, hyperlipidemia, diabetic neuropathy, chronic kidney disease who presents with abdominal pain.  Patient reports he has had ongoing abdominal pain since March.  Initially he had epigastric burning-like pain after he started pain medications for his foot.  He is now taking acid reducers and feels that this helps some.  However, he has had some persistent pain and now reports "pain all over."  He reports bilateral flank pain.  No nausea, vomiting.  He reports difficulty with bowel movements.  He does state that he took Pepto-Bismol with minimal relief.  Last bowel movement was yesterday but was hard.  Denies fevers, cough, shortness of breath.  Denies any urinary symptoms.  Past Medical History:  Diagnosis Date  . Diabetic neuropathy ([place]) 03/17/2017  . Hyperlipidemia   . Hypertension   . Microalbuminuria due to type 2 diabetes mellitus ([place]) 01/30/2017  . Neuropathy   . Onychomycosis of toenail 01/30/2017  . Peripheral neuropathy 03/17/2017  . Type 2 diabetes mellitus, uncontrolled ([place])     Patient Active Problem List   Diagnosis Date Noted  . Anemia due to chronic kidney disease 09/07/2018  . Multiple fractures of ribs, left side, initial encounter for closed fracture 09/02/2018  . Chronic diastolic CHF (congestive heart failure) ([place]) 04/10/2018  . Type II diabetes mellitus with renal manifestations ([place]) 04/09/2018  . Acute renal failure superimposed on stage 2 chronic kidney disease ([place]) 04/09/2018  . Puncture wound of right foot   . Severe sepsis ([place]) 07/17/2017  . Community acquired bacterial pneumonia 07/17/2017  . Abdominal pain 07/17/2017  . Nausea and  vomiting 07/17/2017  . AKI (acute kidney injury) ([place]) 07/17/2017  . Hyponatremia 07/17/2017  . Acute respiratory failure with hypoxemia ([place]) 07/17/2017  . Retinopathy due to secondary diabetes mellitus, with macular edema, with proliferative retinopathy ([place]) 04/16/2017  . Peripheral neuropathy 03/17/2017  . Diabetic neuropathy ([place]) 03/17/2017  . Microalbuminuria due to type 2 diabetes mellitus ([place]) 01/30/2017  . Onychomycosis of toenail 01/30/2017  . Diabetic foot infection ([place]) 09/27/2016  . Sepsis ([place]) 09/27/2016  . Diabetes mellitus ([place])   . Pyogenic inflammation of bone ([place])   . Cellulitis of left foot 09/25/2016  . Noncompliance 09/24/2016  . HLD (hyperlipidemia) 09/30/2014  . Essential hypertension 09/30/2014  . Type 2 diabetes mellitus with hyperglycemia ([place]) 09/30/2014    Past Surgical History:  Procedure Laterality Date  . AMPUTATION Left 09/29/2016   Procedure: 2nd RAY AMPUTATION LEFT FOOT I&D LEFT FOOT;  Surgeon: Edrick Kins, DPM;  Location: [facility];  Service: Podiatry;  Laterality: Left;  . APPENDECTOMY          Home Medications    Prior to Admission medications   Medication Sig Start Date End Date Taking? Authorizing Provider  acetaminophen (TYLENOL) 500 MG tablet Take 2 tablets (1,000 mg total) by mouth every 8 (eight) hours as needed for mild pain or fever. 09/10/18  Yes Rayburn, Claiborne Billings A, PA-C  bisacodyl (DULCOLAX) 5 MG EC tablet Take 10 mg by mouth daily as needed for moderate constipation.   Yes [provider]  blood glucose meter kit and supplies Dispense based on patient and  insurance preference. Use up to four times daily as directed. (FOR ICD-9 250.00, 250.01). 10/05/16  Yes Patrecia Pour, MD  furosemide (LASIX) 40 MG tablet 1 1/2 tabs by mouth in morning only daily Patient taking differently: Take 80 mg by mouth daily.  04/16/18  Yes Mack Hook, MD  gabapentin (NEURONTIN) 300 MG capsule Take 1 capsule (300 mg total) by mouth 3  (three) times daily. Patient taking differently: Take 300 mg by mouth every morning.  02/19/18  Yes Mack Hook, MD  glipiZIDE (GLUCOTROL XL) 5 MG 24 hr tablet Take 10 mg by mouth daily with breakfast.  07/09/18  Yes [provider]  ibuprofen (ADVIL,MOTRIN) 200 MG tablet Take 3 tablets (600 mg total) by mouth every 6 (six) hours as needed for mild pain. 09/10/18  Yes Rayburn, Floyce Stakes, PA-C  Insulin Lispro Prot & Lispro (HUMALOG MIX 75/25 KWIKPEN) (75-25) 100 UNIT/ML Kwikpen Inject 26 units before breakfast and 24 units before dinner daily Patient taking differently: Inject 30 Units into the skin every morning.  08/04/17  Yes Mack Hook, MD  Insulin Pen Needle 31G X 5 MM MISC Use as directed twice daily 01/12/17  Yes Mack Hook, MD  lisinopril (PRINIVIL,ZESTRIL) 20 MG tablet Take 0.5 tablets (10 mg total) by mouth daily. Patient taking differently: Take 20 mg by mouth daily.  04/16/18  Yes Mack Hook, MD  methocarbamol (ROBAXIN) 750 MG tablet Take 1 tablet (750 mg total) by mouth every 8 (eight) hours as needed for muscle spasms. 09/10/18  Yes Rayburn, Claiborne Billings A, PA-C  metoprolol tartrate (LOPRESSOR) 25 MG tablet Take 1 tablet (25 mg total) by mouth 2 (two) times daily. Patient taking differently: Take 25 mg by mouth every morning.  04/19/18  Yes Mack Hook, MD  oxyCODONE (OXY IR/ROXICODONE) 5 MG immediate release tablet Take 1-2 tablets (5-10 mg total) by mouth every 6 (six) hours as needed for severe pain. 09/10/18  Yes Rayburn, Floyce Stakes, PA-C  amLODipine (NORVASC) 10 MG tablet 1 tab by mouth daily Patient not taking: Reported on 02/20/2019 08/04/17   Mack Hook, MD  metFORMIN (GLUCOPHAGE-XR) 500 MG 24 hr tablet 2 tabs by mouth twice daily with meals Patient not taking: Reported on 02/20/2019 10/28/17   Mack Hook, MD  omeprazole (PRILOSEC) 20 MG capsule Take 1 capsule (20 mg total) by mouth 2 (two) times daily before a meal. 02/20/19   Shirah Roseman,  Barbette Hair, MD  potassium chloride SA (K-DUR,KLOR-CON) 20 MEQ tablet Take 1 tablet (20 mEq total) by mouth daily for 7 days. Patient not taking: Reported on 02/20/2019 09/10/18 09/17/18  Rayburn, Claiborne Billings A, PA-C  rosuvastatin (CRESTOR) 20 MG tablet 1 tab by mouth daily with dinner Patient not taking: Reported on 02/20/2019 04/22/18   Mack Hook, MD    Family History Family History  Problem Relation Age of Onset  . Diabetes Mother   . Diabetes Sister   . Diabetes Brother   . Cancer Maternal Grandmother     Social History Social History   Tobacco Use  . Smoking status: Former Smoker    Types: Cigarettes    Start date: 10/31/2015    Quit date: 01/05/2016    Years since quitting: 3.1  . Smokeless tobacco: Never Used  Substance Use Topics  . Alcohol use: No    Alcohol/week: 0.0 standard drinks    Comment: rarely  . Drug use: No     Allergies   Aspirin   Review of Systems Review of Systems  Constitutional: Negative for  fever.  Respiratory: Negative for shortness of breath.   Cardiovascular: Negative for chest pain.  Gastrointestinal: Positive for abdominal distention, abdominal pain and constipation. Negative for diarrhea, nausea and vomiting.  Genitourinary: Negative for dysuria and hematuria.  All other systems reviewed and are negative.    Physical Exam Updated Vital Signs BP (!) 198/98   Pulse 70   Temp 98.5 F (36.9 C) (Oral)   Resp 18   SpO2 99%   Physical Exam Vitals signs and nursing note reviewed.  Constitutional:      Appearance: He is well-developed. He is not ill-appearing.     Comments: Overweight, nontoxic-appearing  HENT:     Head: Normocephalic and atraumatic.  Eyes:     Pupils: Pupils are equal, round, and reactive to light.  Neck:     Musculoskeletal: Neck supple.  Cardiovascular:     Rate and Rhythm: Normal rate and regular rhythm.     Heart sounds: Normal heart sounds. No murmur.  Pulmonary:     Effort: Pulmonary effort is normal.  No respiratory distress.     Breath sounds: Normal breath sounds. No wheezing.  Abdominal:     General: Bowel sounds are normal.     Palpations: Abdomen is soft.     Tenderness: There is generalized abdominal tenderness. There is no guarding or rebound.  Lymphadenopathy:     Cervical: No cervical adenopathy.  Skin:    General: Skin is warm and dry.  Neurological:     Mental Status: He is alert and oriented to person, place, and time.  Psychiatric:        Mood and Affect: Mood normal.      ED Treatments / Results  Labs (all labs ordered are listed, but only abnormal results are displayed) Labs Reviewed  COMPREHENSIVE METABOLIC PANEL - Abnormal; Notable for the following components:      Result Value   Potassium 3.1 (*)    Glucose, Bld 238 (*)    BUN 36 (*)    Creatinine, Ser 4.07 (*)    Calcium 7.9 (*)    Total Protein 5.5 (*)    Albumin 2.0 (*)    GFR calc non Af Amer 16 (*)    GFR calc Af Amer 19 (*)    All other components within normal limits  CBC - Abnormal; Notable for the following components:   RBC 4.02 (*)    Hemoglobin 11.0 (*)    HCT 32.9 (*)    All other components within normal limits  URINALYSIS, ROUTINE W REFLEX MICROSCOPIC - Abnormal; Notable for the following components:   Glucose, UA >=500 (*)    Hgb urine dipstick SMALL (*)    Protein, ur >=300 (*)    Bacteria, UA RARE (*)    All other components within normal limits  LIPASE, BLOOD    EKG None  Radiology Ct Renal Stone Study  Result Date: 02/20/2019 CLINICAL DATA:  53 year old male with chronic abdominal pain. EXAM: CT ABDOMEN AND PELVIS WITHOUT CONTRAST TECHNIQUE: Multidetector CT imaging of the abdomen and pelvis was performed following the standard protocol without IV contrast. COMPARISON:  CT of the abdomen pelvis dated 05/24/2007 FINDINGS: Evaluation of this exam is limited in the absence of intravenous contrast. Lower chest: Linear atelectasis/scarring in the lingula. The visualized lung  bases are otherwise clear. Coronary vascular calcifications noted. No intra-abdominal free air or free fluid. Hepatobiliary: No focal liver abnormality is seen. No gallstones, gallbladder wall thickening, or biliary dilatation. Pancreas: Unremarkable. No pancreatic  ductal dilatation or surrounding inflammatory changes. Spleen: Normal in size without focal abnormality. Adrenals/Urinary Tract: The adrenal glands are unremarkable. There is no hydronephrosis or nephrolithiasis on either side. The visualized ureters and urinary bladder appear unremarkable. Stomach/Bowel: There is no bowel obstruction or active inflammation. Appendectomy. Vascular/Lymphatic: Moderate aortoiliac atherosclerotic disease, new since the prior CT. There are atherosclerotic calcification of the origins of the celiac axis and SMA. There is apparent focal narrowing of the origin of the celiac axis. Evaluation however limited in the absence of contrast. The IVC is unremarkable. No portal venous gas. There is no adenopathy. Reproductive: The prostate and seminal vesicles are grossly unremarkable. No pelvic mass. Other: None Musculoskeletal: Multiple old left rib fractures. No acute osseous pathology. IMPRESSION: No acute intra-abdominal or pelvic pathology. Aortic Atherosclerosis (ICD10-I70.0). Electronically Signed   By: Anner Crete M.D.   On: 02/20/2019 02:31    Procedures Procedures (including critical care time)  Medications Ordered in ED Medications  sodium chloride flush (NS) 0.9 % injection 3 mL (3 mLs Intravenous Not Given 02/20/19 0218)  alum & mag hydroxide-simeth (MAALOX/MYLANTA) 200-200-20 MG/5ML suspension 15 mL (15 mLs Oral Given 02/20/19 0218)     Initial Impression / Assessment and Plan / ED Course  I have reviewed the triage vital signs and the nursing notes.  Pertinent labs & imaging results that were available during my care of the patient were reviewed by me and considered in my medical decision making (see  chart for details).        Patient presents with abdominal pain.  Reports worsening pain with ingestion of his pills for his foot.  He is overall nontoxic appearing.  Notably hypertensive.  History of chronic kidney disease.  No significant tenderness on exam.  Initially his story is most suggestive of reflux or pill esophagitis.  Peptic ulcer disease, pancreatitis, cholecystitis are also considerations.  He also reports difficulty with bowel movements.  Given the flank pain, CT scan of the abdomen was obtained does not show any evidence of obstructing stone or abnormality.  His lab work-up is largely reassuring.  He cannot tell me what he takes for his reflux.  Will start on Protonix twice daily and refer to gastroenterology.  I have encouraged him to drink lots of water when taking pills to avoid pill esophagitis.  After history, exam, and medical workup I feel the patient has been appropriately medically screened and is safe for discharge home. Pertinent diagnoses were discussed with the patient. Patient was given return precautions.   Final Clinical Impressions(s) / ED Diagnoses   Final diagnoses:  Epigastric pain  Essential hypertension    ED Discharge Orders         Ordered    omeprazole (PRILOSEC) 20 MG capsule  2 times daily before meals     02/20/19 0405           Alijah Akram, Barbette Hair, MD 02/20/19 [phone]

## 2019-02-20 NOTE — ED Notes (Signed)
Discharge instructions discussed with pt. Pt verbalized understanding. Pt stable and ambulatory. No signature pad available. 

## 2019-02-20 NOTE — Discharge Instructions (Addendum)
You were seen today for abdominal pain.  This is likely esophagitis or reflux.  Make sure to take plenty of water with any pills.  Take omeprazole twice daily.  Follow-up closely with gastroenterology.

## 2019-02-20 NOTE — ED Notes (Signed)
Pt tolerating water well

## 2019-02-28 ENCOUNTER — Encounter: Payer: Self-pay | Admitting: Nurse Practitioner

## 2019-03-02 ENCOUNTER — Other Ambulatory Visit: Payer: Self-pay | Admitting: Internal Medicine

## 2019-03-07 ENCOUNTER — Other Ambulatory Visit: Payer: Self-pay

## 2019-03-07 ENCOUNTER — Encounter (HOSPITAL_COMMUNITY): Payer: Self-pay | Admitting: Emergency Medicine

## 2019-03-07 ENCOUNTER — Inpatient Hospital Stay (HOSPITAL_COMMUNITY)
Admission: EM | Admit: 2019-03-07 | Discharge: 2019-03-16 | DRG: 698 | Disposition: A | Discharge status: Home / Self Care | Payer: Self-pay | Attending: Student in an Organized Health Care Education/Training Program | Admitting: Student in an Organized Health Care Education/Training Program

## 2019-03-07 ENCOUNTER — Emergency Department (HOSPITAL_COMMUNITY): Payer: Self-pay

## 2019-03-07 DIAGNOSIS — E1122 Type 2 diabetes mellitus with diabetic chronic kidney disease: Secondary | ICD-10-CM | POA: Diagnosis present

## 2019-03-07 DIAGNOSIS — T502X5A Adverse effect of carbonic-anhydrase inhibitors, benzothiadiazides and other diuretics, initial encounter: Secondary | ICD-10-CM | POA: Diagnosis present

## 2019-03-07 DIAGNOSIS — I5043 Acute on chronic combined systolic (congestive) and diastolic (congestive) heart failure: Secondary | ICD-10-CM | POA: Diagnosis present

## 2019-03-07 DIAGNOSIS — E11319 Type 2 diabetes mellitus with unspecified diabetic retinopathy without macular edema: Secondary | ICD-10-CM | POA: Diagnosis present

## 2019-03-07 DIAGNOSIS — Z79891 Long term (current) use of opiate analgesic: Secondary | ICD-10-CM

## 2019-03-07 DIAGNOSIS — Z8249 Family history of ischemic heart disease and other diseases of the circulatory system: Secondary | ICD-10-CM

## 2019-03-07 DIAGNOSIS — E876 Hypokalemia: Secondary | ICD-10-CM | POA: Diagnosis present

## 2019-03-07 DIAGNOSIS — D631 Anemia in chronic kidney disease: Secondary | ICD-10-CM | POA: Diagnosis present

## 2019-03-07 DIAGNOSIS — N185 Chronic kidney disease, stage 5: Secondary | ICD-10-CM | POA: Diagnosis present

## 2019-03-07 DIAGNOSIS — Z794 Long term (current) use of insulin: Secondary | ICD-10-CM

## 2019-03-07 DIAGNOSIS — Z809 Family history of malignant neoplasm, unspecified: Secondary | ICD-10-CM

## 2019-03-07 DIAGNOSIS — E1140 Type 2 diabetes mellitus with diabetic neuropathy, unspecified: Secondary | ICD-10-CM | POA: Diagnosis present

## 2019-03-07 DIAGNOSIS — J918 Pleural effusion in other conditions classified elsewhere: Secondary | ICD-10-CM | POA: Diagnosis present

## 2019-03-07 DIAGNOSIS — N049 Nephrotic syndrome with unspecified morphologic changes: Principal | ICD-10-CM | POA: Diagnosis present

## 2019-03-07 DIAGNOSIS — N2581 Secondary hyperparathyroidism of renal origin: Secondary | ICD-10-CM | POA: Diagnosis present

## 2019-03-07 DIAGNOSIS — R778 Other specified abnormalities of plasma proteins: Secondary | ICD-10-CM

## 2019-03-07 DIAGNOSIS — R1013 Epigastric pain: Secondary | ICD-10-CM | POA: Diagnosis present

## 2019-03-07 DIAGNOSIS — R0602 Shortness of breath: Secondary | ICD-10-CM

## 2019-03-07 DIAGNOSIS — Z833 Family history of diabetes mellitus: Secondary | ICD-10-CM

## 2019-03-07 DIAGNOSIS — Z7984 Long term (current) use of oral hypoglycemic drugs: Secondary | ICD-10-CM

## 2019-03-07 DIAGNOSIS — E1321 Other specified diabetes mellitus with diabetic nephropathy: Secondary | ICD-10-CM | POA: Diagnosis present

## 2019-03-07 DIAGNOSIS — Z79899 Other long term (current) drug therapy: Secondary | ICD-10-CM

## 2019-03-07 DIAGNOSIS — Z87891 Personal history of nicotine dependence: Secondary | ICD-10-CM

## 2019-03-07 DIAGNOSIS — E785 Hyperlipidemia, unspecified: Secondary | ICD-10-CM | POA: Diagnosis present

## 2019-03-07 DIAGNOSIS — I132 Hypertensive heart and chronic kidney disease with heart failure and with stage 5 chronic kidney disease, or end stage renal disease: Secondary | ICD-10-CM | POA: Diagnosis present

## 2019-03-07 DIAGNOSIS — E1129 Type 2 diabetes mellitus with other diabetic kidney complication: Secondary | ICD-10-CM | POA: Diagnosis present

## 2019-03-07 DIAGNOSIS — Z87441 Personal history of nephrotic syndrome: Secondary | ICD-10-CM

## 2019-03-07 DIAGNOSIS — K219 Gastro-esophageal reflux disease without esophagitis: Secondary | ICD-10-CM | POA: Diagnosis present

## 2019-03-07 DIAGNOSIS — Z20828 Contact with and (suspected) exposure to other viral communicable diseases: Secondary | ICD-10-CM | POA: Diagnosis present

## 2019-03-07 DIAGNOSIS — I5033 Acute on chronic diastolic (congestive) heart failure: Secondary | ICD-10-CM

## 2019-03-07 DIAGNOSIS — N179 Acute kidney failure, unspecified: Secondary | ICD-10-CM | POA: Diagnosis present

## 2019-03-07 LAB — HEPATIC FUNCTION PANEL
ALT: 20 U/L (ref 0–44)
AST: 26 U/L (ref 15–41)
Albumin: 1.9 g/dL — ABNORMAL LOW (ref 3.5–5.0)
Alkaline Phosphatase: 113 U/L (ref 38–126)
Bilirubin, Direct: <0.1 mg/dL (ref 0.0–0.2)
Total Bilirubin: 0.3 mg/dL (ref 0.3–1.2)
Total Protein: 5.7 g/dL — ABNORMAL LOW (ref 6.5–8.1)

## 2019-03-07 LAB — TROPONIN I (HIGH SENSITIVITY)
Troponin I (High Sensitivity): 41 ng/L — ABNORMAL HIGH (ref <18)
Troponin I (High Sensitivity): 58 ng/L — ABNORMAL HIGH (ref <18)

## 2019-03-07 LAB — CBC
HCT: 34.7 % — ABNORMAL LOW (ref 39.0–52.0)
Hemoglobin: 10.9 g/dL — ABNORMAL LOW (ref 13.0–17.0)
MCH: 27.1 pg (ref 26.0–34.0)
MCHC: 31.4 g/dL (ref 30.0–36.0)
MCV: 86.3 fL (ref 80.0–100.0)
Platelets: 223 10*3/uL (ref 150–400)
RBC: 4.02 MIL/uL — ABNORMAL LOW (ref 4.22–5.81)
RDW: 13.2 % (ref 11.5–15.5)
WBC: 9.2 10*3/uL (ref 4.0–10.5)
nRBC: 0 % (ref 0.0–0.2)

## 2019-03-07 LAB — LIPID PANEL
Cholesterol: 342 mg/dL — ABNORMAL HIGH (ref 0–200)
HDL: 24 mg/dL — ABNORMAL LOW (ref >40)
LDL Cholesterol: UNDETERMINED mg/dL (ref 0–99)
Total CHOL/HDL Ratio: 14.3 RATIO
Triglycerides: 906 mg/dL — ABNORMAL HIGH (ref <150)
VLDL: UNDETERMINED mg/dL (ref 0–40)

## 2019-03-07 LAB — URINALYSIS, ROUTINE W REFLEX MICROSCOPIC
Bilirubin Urine: NEGATIVE
Glucose, UA: >=500 mg/dL — AB
Ketones, ur: NEGATIVE mg/dL
Leukocytes,Ua: NEGATIVE
Nitrite: NEGATIVE
Protein, ur: >=300 mg/dL — AB
Specific Gravity, Urine: 1.01 (ref 1.005–1.030)
pH: 7 (ref 5.0–8.0)

## 2019-03-07 LAB — BASIC METABOLIC PANEL
Anion gap: 14 (ref 5–15)
BUN: 35 mg/dL — ABNORMAL HIGH (ref 6–20)
CO2: 17 mmol/L — ABNORMAL LOW (ref 22–32)
Calcium: 7.7 mg/dL — ABNORMAL LOW (ref 8.9–10.3)
Chloride: 105 mmol/L (ref 98–111)
Creatinine, Ser: 4.23 mg/dL — ABNORMAL HIGH (ref 0.61–1.24)
GFR calc Af Amer: 18 mL/min — ABNORMAL LOW (ref >60)
GFR calc non Af Amer: 15 mL/min — ABNORMAL LOW (ref >60)
Glucose, Bld: 235 mg/dL — ABNORMAL HIGH (ref 70–99)
Potassium: 3.8 mmol/L (ref 3.5–5.1)
Sodium: 136 mmol/L (ref 135–145)

## 2019-03-07 LAB — HEMOGLOBIN A1C
Hgb A1c MFr Bld: 8 % — ABNORMAL HIGH (ref 4.8–5.6)
Mean Plasma Glucose: 182.9 mg/dL

## 2019-03-07 LAB — GLUCOSE, CAPILLARY
Glucose-Capillary: 148 mg/dL — ABNORMAL HIGH (ref 70–99)
Glucose-Capillary: 211 mg/dL — ABNORMAL HIGH (ref 70–99)

## 2019-03-07 LAB — SARS CORONAVIRUS 2 BY RT PCR (HOSPITAL ORDER, PERFORMED IN ~~LOC~~ HOSPITAL LAB): SARS Coronavirus 2: NEGATIVE

## 2019-03-07 LAB — BRAIN NATRIURETIC PEPTIDE: B Natriuretic Peptide: 602 pg/mL — ABNORMAL HIGH (ref 0.0–100.0)

## 2019-03-07 LAB — LDL CHOLESTEROL, DIRECT: Direct LDL: 76.6 mg/dL (ref 0–99)

## 2019-03-07 MED ORDER — ENOXAPARIN SODIUM 30 MG/0.3ML ~~LOC~~ SOLN: 30.0000 mg | SUBCUTANEOUS | Status: DC

## 2019-03-07 MED ORDER — NITROGLYCERIN 0.4 MG SL SUBL: 0.4000 mg | SUBLINGUAL_TABLET | SUBLINGUAL | Status: DC | PRN

## 2019-03-07 MED ORDER — ROSUVASTATIN CALCIUM 20 MG PO TABS
20.0000 mg | ORAL_TABLET | Freq: Every day | ORAL | Status: DC
  Administered 2019-03-07 – 2019-03-08 (×2): 20 mg via ORAL
  Filled 2019-03-07 (×2): qty 1

## 2019-03-07 MED ORDER — FUROSEMIDE 10 MG/ML IJ SOLN
120.0000 mg | Freq: Every day | INTRAVENOUS | Status: DC
  Administered 2019-03-08: 120 mg via INTRAVENOUS
  Filled 2019-03-07: qty 12

## 2019-03-07 MED ORDER — INSULIN ASPART 100 UNIT/ML ~~LOC~~ SOLN
0.0000 [IU] | Freq: Three times a day (TID) | SUBCUTANEOUS | Status: DC
  Administered 2019-03-08: 3 [IU] via SUBCUTANEOUS
  Administered 2019-03-08: 2 [IU] via SUBCUTANEOUS
  Administered 2019-03-08 – 2019-03-09 (×3): 3 [IU] via SUBCUTANEOUS
  Administered 2019-03-09: 2 [IU] via SUBCUTANEOUS
  Administered 2019-03-10 (×3): 3 [IU] via SUBCUTANEOUS
  Administered 2019-03-11 (×2): 2 [IU] via SUBCUTANEOUS
  Administered 2019-03-11 – 2019-03-12 (×2): 3 [IU] via SUBCUTANEOUS
  Administered 2019-03-12: 5 [IU] via SUBCUTANEOUS
  Administered 2019-03-13 (×2): 2 [IU] via SUBCUTANEOUS
  Administered 2019-03-13: 3 [IU] via SUBCUTANEOUS
  Administered 2019-03-14: 17:00:00 5 [IU] via SUBCUTANEOUS
  Administered 2019-03-14 (×2): 3 [IU] via SUBCUTANEOUS
  Administered 2019-03-15 (×3): 2 [IU] via SUBCUTANEOUS
  Administered 2019-03-16: 3 [IU] via SUBCUTANEOUS

## 2019-03-07 MED ORDER — INSULIN GLARGINE 100 UNIT/ML ~~LOC~~ SOLN
10.0000 [IU] | Freq: Every day | SUBCUTANEOUS | Status: DC
  Administered 2019-03-07 – 2019-03-11 (×5): 10 [IU] via SUBCUTANEOUS
  Filled 2019-03-07 (×6): qty 0.1

## 2019-03-07 MED ORDER — ALUM & MAG HYDROXIDE-SIMETH 200-200-20 MG/5ML PO SUSP
30.0000 mL | Freq: Four times a day (QID) | ORAL | Status: DC | PRN
  Administered 2019-03-07 – 2019-03-08 (×2): 30 mL via ORAL
  Filled 2019-03-07 (×2): qty 30

## 2019-03-07 MED ORDER — METOPROLOL TARTRATE 25 MG PO TABS
25.0000 mg | ORAL_TABLET | Freq: Two times a day (BID) | ORAL | Status: DC
  Administered 2019-03-07 – 2019-03-16 (×19): 25 mg via ORAL
  Filled 2019-03-07 (×19): qty 1

## 2019-03-07 MED ORDER — FUROSEMIDE 10 MG/ML IJ SOLN
80.0000 mg | Freq: Once | INTRAMUSCULAR | Status: AC
  Administered 2019-03-07: 16:00:00 80 mg via INTRAVENOUS
  Filled 2019-03-07: qty 8

## 2019-03-07 MED ORDER — FUROSEMIDE 10 MG/ML IJ SOLN
40.0000 mg | Freq: Once | INTRAMUSCULAR | Status: AC
  Administered 2019-03-07: 14:00:00 40 mg via INTRAVENOUS
  Filled 2019-03-07: qty 4

## 2019-03-07 MED ORDER — HEPARIN SODIUM (PORCINE) 5000 UNIT/ML IJ SOLN
5000.0000 [IU] | Freq: Three times a day (TID) | INTRAMUSCULAR | Status: DC
  Administered 2019-03-07 – 2019-03-16 (×26): 5000 [IU] via SUBCUTANEOUS
  Filled 2019-03-07 (×28): qty 1

## 2019-03-07 MED ORDER — AMLODIPINE BESYLATE 10 MG PO TABS
10.0000 mg | ORAL_TABLET | Freq: Every day | ORAL | Status: DC
  Administered 2019-03-07 – 2019-03-08 (×2): 10 mg via ORAL
  Filled 2019-03-07: qty 1
  Filled 2019-03-07: qty 2

## 2019-03-07 MED ORDER — GABAPENTIN 300 MG PO CAPS
300.0000 mg | ORAL_CAPSULE | Freq: Three times a day (TID) | ORAL | Status: DC
  Administered 2019-03-07 – 2019-03-14 (×21): 300 mg via ORAL
  Filled 2019-03-07 (×21): qty 1

## 2019-03-07 MED ORDER — SODIUM CHLORIDE 0.9% FLUSH
3.0000 mL | Freq: Once | INTRAVENOUS | Status: AC
  Administered 2019-03-07: 16:00:00 3 mL via INTRAVENOUS

## 2019-03-07 MED ORDER — ENALAPRILAT 1.25 MG/ML IV SOLN
1.2500 mg | Freq: Once | INTRAVENOUS | Status: AC
  Administered 2019-03-07: 1.25 mg via INTRAVENOUS
  Filled 2019-03-07: qty 1

## 2019-03-07 NOTE — ED Triage Notes (Signed)
Pt reports he was sent over by PCP, reports he has fluid in his lungs. Pt reports SHOB when trying to lay down. Pt has hx of HTN, ran out of his lisinopril. Pt reports L side CP radiating to back x 3 days.

## 2019-03-07 NOTE — ED Notes (Signed)
ED TO INPATIENT HANDOFF REPORT  ED Nurse Name and Phone #:  [phone]  S Name/Age/Gender Willie Jordan 53 y.o. male Room/Bed: 041C/041C  Code Status   Code Status: Prior  Home/SNF/Other Home Patient oriented to: self, place, time and situation Is this baseline? Yes   Triage Complete: Triage complete  Chief Complaint Sent by Dr "liquid in lungs"  Triage Note Pt reports he was sent over by PCP, reports he has fluid in his lungs. Pt reports SHOB when trying to lay down. Pt has hx of HTN, ran out of his lisinopril. Pt reports L side CP radiating to back x 3 days.    Allergies Allergies  Allergen Reactions  . Aspirin Rash    Level of Care/Admitting Diagnosis ED Disposition    ED Disposition Condition [facility] Area: [place] [[id number]]  Level of Care: Telemetry Cardiac [103]  Covid Evaluation: Confirmed COVID Negative  Diagnosis: Volume overload [id number]  Admitting Physician: Bosie Helper  Attending Physician: Lucious Groves [[id number]]  Estimated length of stay: past midnight tomorrow  Certification:: I certify this patient will need inpatient services for at least 2 midnights  PT Class (Do Not Modify): Inpatient [101]  PT Acc Code (Do Not Modify): Private [1]       B Medical/Surgery History Past Medical History:  Diagnosis Date  . Diabetic neuropathy ([place]) 03/17/2017  . Hyperlipidemia   . Hypertension   . Microalbuminuria due to type 2 diabetes mellitus ([place]) 01/30/2017  . Neuropathy   . Onychomycosis of toenail 01/30/2017  . Peripheral neuropathy 03/17/2017  . Type 2 diabetes mellitus, uncontrolled ([place])    Past Surgical History:  Procedure Laterality Date  . AMPUTATION Left 09/29/2016   Procedure: 2nd RAY AMPUTATION LEFT FOOT I&D LEFT FOOT;  Surgeon: Edrick Kins, DPM;  Location: [facility];  Service: Podiatry;  Laterality: Left;  . APPENDECTOMY       A IV Location/Drains/Wounds Patient Lines/Drains/Airways Status    Active Line/Drains/Airways    Name:   Placement date:   Placement time:   Site:   Days:   Peripheral IV 03/07/19 Left Hand   03/07/19    1242    Hand   less than 1   Incision (Closed) 09/29/16 Leg Left   09/29/16    1841     889   Wound / Incision (Open or Dehisced) 09/27/16 Diabetic ulcer Foot Left open wound b/t great toe and first toe   09/27/16    1603    Foot   891   Wound / Incision (Open or Dehisced) 04/10/18 Puncture Foot Right   04/10/18    0303    Foot   331          Intake/Output Last 24 hours  Intake/Output Summary (Last 24 hours) at 03/07/2019 1554 Last data filed at 03/07/2019 1247 Gross per 24 hour  Intake -  Output 325 ml  Net -325 ml    Labs/Imaging Results for orders placed or performed during the hospital encounter of 03/07/19 (from the past 48 hour(s))  Basic metabolic panel     Status: Abnormal   Collection Time: 03/07/19 11:58 AM  Result Value Ref Range   Sodium 136 135 - 145 mmol/L   Potassium 3.8 3.5 - 5.1 mmol/L   Chloride 105 98 - 111 mmol/L   CO2 17 (L) 22 - 32 mmol/L   Glucose, Bld 235 (H) 70 - 99 mg/dL   BUN 35 (H)  6 - 20 mg/dL   Creatinine, Ser 4.23 (H) 0.61 - 1.24 mg/dL   Calcium 7.7 (L) 8.9 - 10.3 mg/dL   GFR calc non Af Amer 15 (L) >60 mL/min   GFR calc Af Amer 18 (L) >60 mL/min   Anion gap 14 5 - 15    Comment: Performed at [facility] [address] [place], [place] [address]  CBC     Status: Abnormal   Collection Time: 03/07/19 11:58 AM  Result Value Ref Range   WBC 9.2 4.0 - 10.5 K/uL   RBC 4.02 (L) 4.22 - 5.81 MIL/uL   Hemoglobin 10.9 (L) 13.0 - 17.0 g/dL   HCT 34.7 (L) 39.0 - 52.0 %   MCV 86.3 80.0 - 100.0 fL   MCH 27.1 26.0 - 34.0 pg   MCHC 31.4 30.0 - 36.0 g/dL   RDW 13.2 11.5 - 15.5 %   Platelets 223 150 - 400 K/uL   nRBC 0.0 0.0 - 0.2 %    Comment: Performed at [facility], [place] [address] [place], [place] [address]  Troponin I (High Sensitivity)     Status: Abnormal   Collection Time: 03/07/19 11:58 AM   Result Value Ref Range   Troponin I (High Sensitivity) 41 (H) <18 ng/L    Comment: (NOTE) Elevated high sensitivity troponin I (hsTnI) values and significant  changes across serial measurements may suggest ACS but many other  chronic and acute conditions are known to elevate hsTnI results.  Refer to the "Links" section for chest pain algorithms and additional  guidance. Performed at [facility], [place] [address], [place], [place] [address]   Brain natriuretic peptide     Status: Abnormal   Collection Time: 03/07/19 12:30 PM  Result Value Ref Range   B Natriuretic Peptide 602.0 (H) 0.0 - 100.0 pg/mL    Comment: Performed at [facility] [address], [place], [place] [address]  SARS Coronavirus 2 [facility] order, Performed in [facility] hospital lab) Nasopharyngeal Nasopharyngeal Swab     Status: None   Collection Time: 03/07/19  1:24 PM   Specimen: Nasopharyngeal Swab  Result Value Ref Range   SARS Coronavirus 2 NEGATIVE NEGATIVE    Comment: (NOTE) If result is NEGATIVE SARS-CoV-2 target nucleic acids are NOT DETECTED. The SARS-CoV-2 RNA is generally detectable in upper and lower  respiratory specimens during the acute phase of infection. The lowest  concentration of SARS-CoV-2 viral copies this assay can detect is 250  copies / mL. A negative result does not preclude SARS-CoV-2 infection  and should not be used as the sole basis for treatment or other  patient management decisions.  A negative result may occur with  improper specimen collection / handling, submission of specimen other  than nasopharyngeal swab, presence of viral mutation(s) within the  areas targeted by this assay, and inadequate number of viral copies  (<250 copies / mL). A negative result must be combined with clinical  observations, patient history, and epidemiological information. If result is POSITIVE SARS-CoV-2 target nucleic acids are DETECTED. The SARS-CoV-2 RNA is generally detectable  in upper and lower  respiratory specimens dur ing the acute phase of infection.  Positive  results are indicative of active infection with SARS-CoV-2.  Clinical  correlation with patient history and other diagnostic information is  necessary to determine patient infection status.  Positive results do  not rule out bacterial infection or co-infection with other viruses. If result is  PRESUMPTIVE POSTIVE SARS-CoV-2 nucleic acids MAY BE PRESENT.   A presumptive positive result was obtained on the submitted specimen  and confirmed on repeat testing.  While 2019 novel coronavirus  (SARS-CoV-2) nucleic acids may be present in the submitted sample  additional confirmatory testing may be necessary for epidemiological  and / or clinical management purposes  to differentiate between  SARS-CoV-2 and other Sarbecovirus currently known to infect humans.  If clinically indicated additional testing with an alternate test  methodology [id number]) is advised. The SARS-CoV-2 RNA is generally  detectable in upper and lower respiratory sp ecimens during the acute  phase of infection. The expected result is Negative. Fact Sheet for Patients:  StrictlyIdeas.no Fact Sheet for Healthcare Providers: BankingDealers.co.za This test is not yet approved or cleared by the [place] FDA and has been authorized for detection and/or diagnosis of SARS-CoV-2 by FDA under an Emergency Use Authorization (EUA).  This EUA will remain in effect (meaning this test can be used) for the duration of the COVID-19 declaration under Section 564(b)(1) of the Act, 21 U.S.C. section 360bbb-3(b)(1), unless the authorization is terminated or revoked sooner. Performed at [facility], [place] [address], [place], [place] [address]   Troponin I (High Sensitivity)     Status: Abnormal   Collection Time: 03/07/19  1:25 PM  Result Value Ref Range   Troponin I (High Sensitivity) 58 (H)  <18 ng/L    Comment: (NOTE) Elevated high sensitivity troponin I (hsTnI) values and significant  changes across serial measurements may suggest ACS but many other  chronic and acute conditions are known to elevate hsTnI results.  Refer to the Links section for chest pain algorithms and additional  guidance. Performed at [facility], [place] [address], [place], [place] [address]   Hepatic function panel     Status: Abnormal   Collection Time: 03/07/19  1:25 PM  Result Value Ref Range   Total Protein 5.7 (L) 6.5 - 8.1 g/dL   Albumin 1.9 (L) 3.5 - 5.0 g/dL   AST 26 15 - 41 U/L   ALT 20 0 - 44 U/L   Alkaline Phosphatase 113 38 - 126 U/L   Total Bilirubin 0.3 0.3 - 1.2 mg/dL   Bilirubin, Direct <0.1 0.0 - 0.2 mg/dL   Indirect Bilirubin NOT CALCULATED 0.3 - 0.9 mg/dL    Comment: Performed at [facility] [address], [place],  [address]  Lipid panel     Status: Abnormal   Collection Time: 03/07/19  2:15 PM  Result Value Ref Range   Cholesterol 342 (H) 0 - 200 mg/dL   Triglycerides 906 (H) <150 mg/dL   HDL 24 (L) >40 mg/dL   Total CHOL/HDL Ratio 14.3 RATIO   VLDL UNABLE TO CALCULATE IF TRIGLYCERIDE OVER 400 mg/dL 0 - 40 mg/dL   LDL Cholesterol UNABLE TO CALCULATE IF TRIGLYCERIDE OVER 400 mg/dL 0 - 99 mg/dL    Comment:        Total Cholesterol/HDL:CHD Risk Coronary Heart Disease Risk Table                     Men   Women  1/2 Average Risk   3.4   3.3  Average Risk       5.0   4.4  2 X Average Risk   9.6   7.1  3 X Average Risk  23.4   11.0        Use the calculated Patient  Ratio above and the CHD Risk Table to determine the patient's CHD Risk.        ATP III CLASSIFICATION (LDL):  <100     mg/dL   Optimal  100-129  mg/dL   Near or Above                    Optimal  130-159  mg/dL   Borderline  160-189  mg/dL   High  >190     mg/dL   Very High Performed at [facility] [address], [place], [place] [address]   LDL cholesterol, direct      Status: None   Collection Time: 03/07/19  2:15 PM  Result Value Ref Range   Direct LDL 76.6 0 - 99 mg/dL    Comment: Performed at [facility] [address], [place], [place] [address]   Dg Chest 2 View  Result Date: 03/07/2019 CLINICAL DATA:  Shortness of breath, chest pain EXAM: CHEST - 2 VIEW COMPARISON:  09/07/2018 FINDINGS: Cardiac silhouette remains mildly enlarged. Mild patchy left basilar opacity. Trace bilateral pleural effusions. No pneumothorax. IMPRESSION: 1. Mild patchy left basilar opacity, which may reflect atelectasis and/or pneumonia in the appropriate clinical setting. 2. Trace bilateral pleural effusions. Electronically Signed   By: Davina Poke M.D.   On: 03/07/2019 12:12    Pending Labs Unresulted Labs (From admission, onward)    Start     Ordered   03/07/19 1415  Urinalysis, Routine w reflex microscopic  Once,   STAT     03/07/19 1414          Vitals/Pain Today's Vitals   03/07/19 1430 03/07/19 1500 03/07/19 1515 03/07/19 1550  BP: (!) 201/95 (!) 214/99 (!) 209/92   Pulse: 60 66 65   Resp:   18   Temp:      TempSrc:      SpO2:  96% 94%   PainSc:    0-No pain    Isolation Precautions No active isolations  Medications Medications  nitroGLYCERIN (NITROSTAT) SL tablet 0.4 mg (has no administration in time range)  amLODipine (NORVASC) tablet 10 mg (10 mg Oral Given 03/07/19 1551)  gabapentin (NEURONTIN) capsule 300 mg (has no administration in time range)  metoprolol tartrate (LOPRESSOR) tablet 25 mg (25 mg Oral Given 03/07/19 1551)  rosuvastatin (CRESTOR) tablet 20 mg (has no administration in time range)  sodium chloride flush (NS) 0.9 % injection 3 mL (3 mLs Intravenous Given 03/07/19 1551)  enalaprilat (VASOTEC) injection 1.25 mg (1.25 mg Intravenous Given 03/07/19 1246)  furosemide (LASIX) injection 40 mg (40 mg Intravenous Given 03/07/19 1400)  furosemide (LASIX) injection 80 mg (80 mg Intravenous Given 03/07/19 1551)     Mobility walks Low fall risk   Focused Assessments Cardiac Assessment Handoff:    Lab Results  Component Value Date   CKTOTAL 186 09/08/2018   No results found for: DDIMER Does the Patient currently have chest pain? No     R Recommendations: See Admitting Provider Note  Report given to:   Additional Notes:

## 2019-03-07 NOTE — H&P (Addendum)
Date: 03/07/2019               Patient Name:  Willie Jordan MRN: [id number]  DOB: 01/03/1966 Age / Sex: 53 y.o., male   PCP: Vassie Moment, MD              Medical Service: Internal Medicine Teaching Service              Attending Physician: Dr. Lucious Groves, DO    First Contact: Rudean Hitt, [place] 3 Pager: [phone]  Second Contact: Dr. Georgia Lopes Pager: [phone]  Third Contact Dr. Maudie Mercury Pager: [phone]       After Hours (After 5p/  First Contact Pager: [phone]  weekends / holidays): Second Contact Pager: [phone]   Chief Complaint: shortness of breath, chest pain   History of Present Illness:  Willie Jordan is a 53 y.o. male with a history of DM Type 2, HTN, HLD, CKD, and chronic diastolic CHF who presents to the ED from his PCP with a three day history of worsening shortness of breath and chest pain. He reports that he ran out of lisinopril about one week ago. He also states that around that time, he changed the way he has been taking his furosemide from 120 mg BID to 120 mg in the AM. In the past three days, he endorses orthopnea, increased SHOB with walking and climbing stairs, swelling of his abdomen, and left sided chest pain radiating to his back, which is typically worse when reclining at night and can be elicited with palpation. He also endorses a 3-4 day history of hourly diarrhea, worsening cough, and decreased urine output, and several days of abdominal discomfort improved slightly with omeprazole and Maalox. He denies headache, fever, nausea, and vomiting.    Meds:  Current Meds  Medication Sig   acetaminophen (TYLENOL) 500 MG tablet Take 2 tablets (1,000 mg total) by mouth every 8 (eight) hours as needed for mild pain or fever.   alum & mag hydroxide-simeth (MAALOX/MYLANTA) 200-200-20 MG/5ML suspension Take 30 mLs by mouth every 6 (six) hours as needed for indigestion or heartburn.   furosemide (LASIX) 40 MG tablet 1 1/2 tabs by mouth in morning only  daily (Patient taking differently: Take 120 mg by mouth 2 (two) times daily. )   gabapentin (NEURONTIN) 300 MG capsule Take 1 capsule (300 mg total) by mouth 3 (three) times daily. (Patient taking differently: Take 300 mg by mouth every morning. )   glipiZIDE (GLUCOTROL) 10 MG tablet Take 10 mg by mouth 2 (two) times daily before a meal.   Insulin Lispro Prot & Lispro (HUMALOG MIX 75/25 KWIKPEN) (75-25) 100 UNIT/ML Kwikpen Inject 26 units before breakfast and 24 units before dinner daily (Patient taking differently: Inject 24-30 Units into the skin See admin instructions. Sliding scale as needed)   lisinopril (PRINIVIL,ZESTRIL) 20 MG tablet Take 0.5 tablets (10 mg total) by mouth daily. (Patient taking differently: Take 20 mg by mouth daily. )   metoprolol tartrate (LOPRESSOR) 25 MG tablet Take 1 tablet (25 mg total) by mouth 2 (two) times daily.   omeprazole (PRILOSEC) 20 MG capsule Take 1 capsule (20 mg total) by mouth 2 (two) times daily before a meal. (Patient taking differently: Take 20 mg by mouth daily. 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   Physical Exam: Blood pressure (!) 198/91, pulse (!) 56, temperature 98.3 F (36.8 C), temperature source Oral, resp. rate 16, SpO2 96 %.  Gen: Well-developed, well-nourished male, lying down with head of bed raised, in no acute distress.  HEENT: NCAT, moist mucous membranes Neck: normal JVP 2cm above the clavicle, no thyromegaly or adenopathy CV: Normal S1, S2, no m/r/g appreciated. 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 man with a history of CKD, FSGS, chronic diastolic heart failure, DM Type II, HTN, and HDL with signs and symptoms of volume overload including worsening shortness of breath, pitting edema, and pleural effusions and infiltrates on chest x-ray. He also endorses non-exertional chest pain, abdominal discomfort, and diarrhea. This has occurred within a week of running out of lisinopril and starting to take half his prescribed dose of lasix. Given his description of his chest pain (non-exertional, elicited to some degree with palpation, worse when reclining at night), ACS seems an unlikely cause. Given slightly elevated highly sensitive troponins (41 and 58) and some non-specific ST and T-wave changes concerning for ischemia on EKG, we will repeat EKG and troponin in the  morning and continue to monitor. The most likely explanations for his volume status are CHF exacerbation and nephrotic syndrome. Given his previous diagnosis of FSGS and nephrosis, his urinalysis from today revealing Protein >300, and lipid panel revealing cholesterol of 342 and triglycerides of 906, and hypoalbuminemia to 1.9, nephrotic syndrome seems the most likely cause of his symptoms.   Volume overload:  - Most likely due to nephrosis.  - Diurese w/ furosemide 120 mg BID - Strict I's & O's - TTE, repeat EKG to evaluate for cardiac etiology - Continuous cardiac monitoring  DM Type II:  - SSI  - Lantus 10 units  HTN:  - amlodipine 10 mg QD  HLD:  - Crestor 20 mg QD  Diarrhea:  - Imodium 4 mg, followed by 2 mg PRN   DVT ppx: heparin subQ q8h Diet: Renal/carb modified w/ fluid restriction    Dispo: Admit patient to Inpatient with expected length of stay greater than 2 midnights.  Signed: Kennith Gain,  Sherlon Handing, Medical Student 03/07/2019, 4:57 PM  Pager: [phone]   Attestation for Student Documentation:  I personally was present and performed or re-performed the history, physical exam and medical decision-making activities of this service and have verified that the service and findings are accurately documented in the students note.  53 year old person living with diabetes complicated by nephrotic syndrome and chronic renal failure admitted with volume overload due to a combination of his nephrotic syndrome and acute on chronic heart failure with preserved ejection fraction.  For the patient's nephrotic syndrome he is cared for by Dr. Johnney Ou with nephrology.  Last protein to creatinine ratio was in March and showed 10 g of proteinuria.  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  Chronically he had a creatinine around 2.5 with a GFR around 25, but in August this was worsened and currently his creatinine is over 4 with a GFR around 15.  He was admitted  at 200 pounds with a dry weight in the spring around 185 pounds, I agree that on exam he appears at least 15 pounds volume overloaded.  On point-of-care ultrasounds he has a small to moderate sized right pleural effusion with compressive atelectasis, his IVC is distended but has normal respiratory phasic variation.  Nephrotic syndrome is complicated by hypoalbuminemia and hyperlipidemia.  He has not had a VTE complication, currently on heparin [place] prophylaxis dosing.  I agree with current treatment with IV furosemide 120 mg daily.  Most likely will require more frequent dosing, goal diuresis with the 1-2 kg/day.  Renal dysfunction is too advanced for ACE inhibitor.  Diabetes control seems fine.  Worsening renal function and high degree of proteinuria  portends a high risk of progressing to ESRD over the next months-years.  Likely will require inpatient diuresis through the end of the week.  I think his pleural effusion is due to the nephrotic syndrome, hypoalbuminemia, HFpEF and this will likely respond to diuresis, I do not see a need for thoracentesis right now.  Axel Filler, MD 03/08/2019, 2:23 PM

## 2019-03-07 NOTE — ED Provider Notes (Signed)
[facility] EMERGENCY DEPARTMENT Provider Note   CSN: [id number] Arrival date & time: 03/07/19  1132     History   Chief Complaint Chief Complaint  Patient presents with  . Shortness of Breath    HPI Willie Jordan is a 53 y.o. male.     53 yo M with a chief complaints of shortness of breath.  This been going on for about a week.  Worse with lying flat on exertion.  Has had lower extremity edema for about the same amount of time.  Had seen his family doctor today and there was some concern for volume overload and was sent here for evaluation.  Patient feels that his fluid has extended up into his abdomen.  Has a mild cough denies fevers.  Denies recent sick contacts.  Describes some pain with deep breathing to his abdomen as well as to the left side of his chest.  No exertional component to his chest pain.  The history is provided by the patient.  Shortness of Breath Severity:  Moderate Onset quality:  Gradual Duration:  1 week Timing:  Constant Progression:  Worsening Chronicity:  New Context: activity   Context comment:  Lying flat Relieved by:  Nothing Worsened by:  Nothing Ineffective treatments:  None tried Associated symptoms: abdominal pain, chest pain and cough   Associated symptoms: no fever, no headaches, no rash and no vomiting     Past Medical History:  Diagnosis Date  . Diabetic neuropathy ([place]) 03/17/2017  . Hyperlipidemia   . Hypertension   . Microalbuminuria due to type 2 diabetes mellitus ([place]) 01/30/2017  . Neuropathy   . Onychomycosis of toenail 01/30/2017  . Peripheral neuropathy 03/17/2017  . Type 2 diabetes mellitus, uncontrolled ([place])     Patient Active Problem List   Diagnosis Date Noted  . Volume overload 03/07/2019  . Anemia due to chronic kidney disease 09/07/2018  . Multiple fractures of ribs, left side, initial encounter for closed fracture 09/02/2018  . Chronic diastolic CHF (congestive heart failure) ([place]) 04/10/2018   . Type II diabetes mellitus with renal manifestations ([place]) 04/09/2018  . Acute renal failure superimposed on stage 2 chronic kidney disease ([place]) 04/09/2018  . Puncture wound of right foot   . Severe sepsis ([place]) 07/17/2017  . Community acquired bacterial pneumonia 07/17/2017  . Abdominal pain 07/17/2017  . Nausea and vomiting 07/17/2017  . AKI (acute kidney injury) ([place]) 07/17/2017  . Hyponatremia 07/17/2017  . Acute respiratory failure with hypoxemia ([place]) 07/17/2017  . Retinopathy due to secondary diabetes mellitus, with macular edema, with proliferative retinopathy ([place]) 04/16/2017  . Peripheral neuropathy 03/17/2017  . Diabetic neuropathy ([place]) 03/17/2017  . Microalbuminuria due to type 2 diabetes mellitus ([place]) 01/30/2017  . Onychomycosis of toenail 01/30/2017  . Diabetic foot infection ([place]) 09/27/2016  . Sepsis ([place]) 09/27/2016  . Diabetes mellitus ([place])   . 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 Cellulitis of left foot 09/25/2016  . Noncompliance 09/24/2016  . HLD (hyperlipidemia) 09/30/2014  . Essential hypertension 09/30/2014  . Type 2 diabetes mellitus with hyperglycemia ([place]) 09/30/2014    Past Surgical History:  Procedure Laterality Date  . AMPUTATION Left 09/29/2016   Procedure: 2nd RAY AMPUTATION LEFT FOOT I&D LEFT FOOT;  Surgeon: Edrick Kins, DPM;  Location: [facility];  Service: Podiatry;  Laterality: Left;  . APPENDECTOMY          Home Medications    Prior to Admission medications   Medication Sig Start Date  End Date Taking? Authorizing Provider  acetaminophen (TYLENOL) 500 MG tablet Take 2 tablets (1,000 mg total) by mouth every 8 (eight) hours as needed for mild pain or fever. 09/10/18   Rayburn, Floyce Stakes, PA-C  amLODipine (NORVASC) 10 MG tablet 1 tab by mouth daily Patient not taking: Reported on 02/20/2019 08/04/17   Mack Hook, MD  bisacodyl (DULCOLAX) 5 MG EC tablet Take 10 mg by mouth daily as needed for moderate constipation.    [provider]  blood glucose meter kit and supplies Dispense based on patient and insurance preference. Use up to four times daily as directed. (FOR ICD-9 250.00, 250.01). 10/05/16   Patrecia Pour, MD  furosemide (LASIX) 40 MG tablet 1 1/2 tabs by mouth in morning only daily Patient taking differently: Take 80 mg by mouth daily.  04/16/18   Mack Hook, MD  gabapentin (NEURONTIN) 300 MG capsule Take 1 capsule (300 mg total) by mouth 3 (three) times daily. Patient taking differently: Take 300 mg by mouth every morning.  02/19/18   Mack Hook, MD  glipiZIDE (GLUCOTROL XL) 5 MG 24 hr tablet Take 10 mg by mouth daily with breakfast.  07/09/18   [provider]  ibuprofen (ADVIL,MOTRIN) 200 MG tablet Take 3 tablets (600 mg total) by mouth every 6 (six) hours as needed for mild pain. 09/10/18   Rayburn, Floyce Stakes, PA-C  Insulin Lispro Prot & Lispro (HUMALOG MIX 75/25 KWIKPEN) (75-25) 100 UNIT/ML Kwikpen Inject 26 units before breakfast and 24 units before dinner daily Patient taking differently: Inject 30 Units into the skin every morning.  08/04/17   Mack Hook, MD  Insulin Pen Needle 31G X 5 MM MISC Use as directed twice daily 01/12/17   Mack Hook, MD  lisinopril (PRINIVIL,ZESTRIL) 20 MG tablet Take 0.5 tablets (10 mg total) by mouth daily. Patient taking differently: Take 20 mg by mouth daily.  04/16/18   Mack Hook, MD  metFORMIN (GLUCOPHAGE-XR) 500 MG 24 hr tablet 2 tabs by mouth twice daily with meals Patient not taking: Reported on 02/20/2019 10/28/17   Mack Hook, MD  methocarbamol (ROBAXIN) 750 MG tablet Take 1 tablet (750 mg total) by mouth every 8 (eight) hours as needed for muscle spasms. 09/10/18   Rayburn, Floyce Stakes, PA-C  metoprolol tartrate (LOPRESSOR) 25 MG tablet Take 1 tablet (25 mg total) by mouth 2 (two) times daily. Patient taking differently: Take 25 mg by mouth every morning.  04/19/18   Mack Hook, MD  omeprazole (PRILOSEC)  20 MG capsule Take 1 capsule (20 mg total) by mouth 2 (two) times daily before a meal. 02/20/19   Horton, Barbette Hair, MD  oxyCODONE (OXY IR/ROXICODONE) 5 MG immediate release tablet Take 1-2 tablets (5-10 mg total) by mouth every 6 (six) hours as needed for severe pain. 09/10/18   Rayburn, Claiborne Billings A, PA-C  potassium chloride SA (K-DUR,KLOR-CON) 20 MEQ tablet Take 1 tablet (20 mEq total) by mouth daily for 7 days. Patient not taking: Reported on 02/20/2019 09/10/18 09/17/18  Rayburn, Claiborne Billings A, PA-C  rosuvastatin (CRESTOR) 20 MG tablet 1 tab by mouth daily with dinner Patient not taking: Reported on 02/20/2019 04/22/18   Mack Hook, MD    Family History Family History  Problem Relation Age of Onset  . Diabetes Mother   . Diabetes Sister   . Diabetes Brother   . Cancer Maternal Grandmother     Social History Social History   Tobacco Use  . Smoking status: Former Smoker  Types: Cigarettes    Start date: 10/31/2015    Quit date: 01/05/2016    Years since quitting: 3.1  . Smokeless tobacco: Never Used  Substance Use Topics  . Alcohol use: No    Alcohol/week: 0.0 standard drinks    Comment: rarely  . Drug use: No     Allergies   Aspirin   Review of Systems Review of Systems  Constitutional: Negative for chills and fever.  HENT: Negative for congestion and facial swelling.   Eyes: Negative for discharge and visual disturbance.  Respiratory: Positive for cough and shortness of breath.   Cardiovascular: Positive for chest pain and leg swelling. Negative for palpitations.  Gastrointestinal: Positive for abdominal pain. Negative for diarrhea and vomiting.  Musculoskeletal: Negative for arthralgias and myalgias.  Skin: Negative for color change and rash.  Neurological: Negative for tremors, syncope and headaches.  Psychiatric/Behavioral: Negative for confusion and dysphoric mood.     Physical Exam Updated Vital Signs BP (!) 208/96   Pulse 65   Temp 98.3 F (36.8 C) (Oral)    Resp (!) 24   SpO2 91%   Physical Exam Vitals signs and nursing note reviewed.  Constitutional:      Appearance: He is well-developed.  HENT:     Head: Normocephalic and atraumatic.  Eyes:     Pupils: Pupils are equal, round, and reactive to light.  Neck:     Musculoskeletal: Normal range of motion and neck supple.     Vascular: JVD (to mid neck) present.  Cardiovascular:     Rate and Rhythm: Normal rate and regular rhythm.     Heart sounds: No murmur. No friction rub. No gallop.   Pulmonary:     Effort: No respiratory distress.     Breath sounds: Examination of the right-middle field reveals rales. Examination of the left-middle field reveals rales. Examination of the right-lower field reveals rales. Examination of the left-lower field reveals rales. Rales present. No wheezing.  Abdominal:     General: There is no distension.     Tenderness: There is no guarding or rebound.  Musculoskeletal: Normal range of motion.     Right lower leg: Edema present.     Left lower leg: Edema present.     Comments: 2+ edema to bilateral lower extremities.  Extends up to the lower abdominal wall.  Pitting  Skin:    Coloration: Skin is not pale.     Findings: No rash.  Neurological:     Mental Status: He is alert and oriented to person, place, and time.  Psychiatric:        Behavior: Behavior normal.      ED Treatments / Results  Labs (all labs ordered are listed, but only abnormal results are displayed) Labs Reviewed  BASIC METABOLIC PANEL - Abnormal; Notable for the following components:      Result Value   CO2 17 (*)    Glucose, Bld 235 (*)    BUN 35 (*)    Creatinine, Ser 4.23 (*)    Calcium 7.7 (*)    GFR calc non Af Amer 15 (*)    GFR calc Af Amer 18 (*)    All other components within normal limits  CBC - Abnormal; Notable for the following components:   RBC 4.02 (*)    Hemoglobin 10.9 (*)    HCT 34.7 (*)    All other components within normal limits  BRAIN NATRIURETIC  PEPTIDE - Abnormal; Notable for the following components:  B Natriuretic Peptide 602.0 (*)    All other components within normal limits  HEPATIC FUNCTION PANEL - Abnormal; Notable for the following components:   Total Protein 5.7 (*)    Albumin 1.9 (*)    All other components within normal limits  TROPONIN I (HIGH SENSITIVITY) - Abnormal; Notable for the following components:   Troponin I (High Sensitivity) 41 (*)    All other components within normal limits  TROPONIN I (HIGH SENSITIVITY) - Abnormal; Notable for the following components:   Troponin I (High Sensitivity) 58 (*)    All other components within normal limits  SARS CORONAVIRUS 2 (HOSPITAL ORDER, [facility])  LIPID PANEL  URINALYSIS, ROUTINE W REFLEX MICROSCOPIC    EKG EKG Interpretation  Date/Time:  Monday March 07 2019 11:46:55 EDT Ventricular Rate:  68 PR Interval:  148 QRS Duration: 94 QT Interval:  440 QTC Calculation: 467 R Axis:   76 Text Interpretation:  Normal sinus rhythm Nonspecific ST and T wave abnormality Prolonged QT Abnormal ECG new flipped t waves in I and aVL concerning for ischemia Otherwise no significant change Confirmed by Deno Etienne [phone]) on 03/07/2019 11:58:04 AM   Radiology Dg Chest 2 View  Result Date: 03/07/2019 CLINICAL DATA:  Shortness of breath, chest pain EXAM: CHEST - 2 VIEW COMPARISON:  09/07/2018 FINDINGS: Cardiac silhouette remains mildly enlarged. Mild patchy left basilar opacity. Trace bilateral pleural effusions. No pneumothorax. IMPRESSION: 1. Mild patchy left basilar opacity, which may reflect atelectasis and/or pneumonia in the appropriate clinical setting. 2. Trace bilateral pleural effusions. Electronically Signed   By: Davina Poke M.D.   On: 03/07/2019 12:12    Procedures Procedures (including critical care time)  Medications Ordered in ED Medications  sodium chloride flush (NS) 0.9 % injection 3 mL (has no administration in time range)   nitroGLYCERIN (NITROSTAT) SL tablet 0.4 mg (has no administration in time range)  enalaprilat (VASOTEC) injection 1.25 mg (1.25 mg Intravenous Given 03/07/19 1246)  furosemide (LASIX) injection 40 mg (40 mg Intravenous Given 03/07/19 1400)     Initial Impression / Assessment and Plan / ED Course  I have reviewed the triage vital signs and the nursing notes.  Pertinent labs & imaging results that were available during my care of the patient were reviewed by me and considered in my medical decision making (see chart for details).        53 yo M with a chief complaints of shortness of breath.  Patient has leg edema that preceded this.  Seen by his family doctor and was concerned for volume overload.  Patient with a history of type II diastolic dysfunction.  EF was preserved on last echo done earlier this year.  Patient does have signs of fluid up into his lower abdomen.  Oxygen saturation dips into the low 90s with just minimal talking.  His EKG is somewhat concerning for ischemia with new flipped T waves in 1 and aVL.  Will obtain lab work troponin.  Chest x-ray reviewed by me with increased cephalization.  Trace effusion.  Significantly hypertensive here with blood pressure systolically in the 976B.  Will give a dose of enalapril 3 sublingual nitros.  Patient's troponin is very mildly elevated.  His renal function has worsened since his last visit to our hospital system.  I was able to talk to the nephrology clinic that sent him over.  Their concern was mostly for fluid overload.  States the patient has been noncompliant because when he  would try and take his medications he did have some epigastric discomfort.  As the patient is significantly fluid overloaded I doubt that I will be able to fix this with a one-time dose of Lasix here in the ED.  Patient likely needs to come into the hospital for further diuresis.  Will discuss with medicine.  CRITICAL CARE Performed by: Cecilio Asper    Total critical care time: 80 minutes  Critical care time was exclusive of separately billable procedures and treating other patients.  Critical care was necessary to treat or prevent imminent or life-threatening deterioration.  Critical care was time spent personally by me on the following activities: development of treatment plan with patient and/or surrogate as well as nursing, discussions with consultants, evaluation of patient's response to treatment, examination of patient, obtaining history from patient or surrogate, ordering and performing treatments and interventions, ordering and review of laboratory studies, ordering and review of radiographic studies, pulse oximetry and re-evaluation of patient's condition.  The patients results and plan were reviewed and discussed.   Any x-rays performed were independently reviewed by myself.   Differential diagnosis were considered with the presenting HPI.  Medications  sodium chloride flush (NS) 0.9 % injection 3 mL (has no administration in time range)  nitroGLYCERIN (NITROSTAT) SL tablet 0.4 mg (has no administration in time range)  enalaprilat (VASOTEC) injection 1.25 mg (1.25 mg Intravenous Given 03/07/19 1246)  furosemide (LASIX) injection 40 mg (40 mg Intravenous Given 03/07/19 1400)    Vitals:   03/07/19 1215 03/07/19 1230 03/07/19 1300 03/07/19 1330  BP: (!) 210/96 (!) 211/96 (!) 206/91 (!) 208/96  Pulse: 64 63 63 65  Resp:      Temp:      TempSrc:      SpO2: 96% 94% 94% 91%    Final diagnoses:  Acute on chronic diastolic congestive heart failure (HCC)  Troponin level elevated    Admission/ observation were discussed with the admitting physician, patient and/or family and they are comfortable with the plan.   Final Clinical Impressions(s) / ED Diagnoses   Final diagnoses:  Acute on chronic diastolic congestive heart failure (HCC)  Troponin level elevated    ED Discharge Orders    None       Deno Etienne, DO  03/07/19 1457

## 2019-03-07 NOTE — ED Notes (Signed)
Patient transported to X-ray 

## 2019-03-08 DIAGNOSIS — I130 Hypertensive heart and chronic kidney disease with heart failure and stage 1 through stage 4 chronic kidney disease, or unspecified chronic kidney disease: Secondary | ICD-10-CM

## 2019-03-08 DIAGNOSIS — R601 Generalized edema: Secondary | ICD-10-CM

## 2019-03-08 DIAGNOSIS — N179 Acute kidney failure, unspecified: Secondary | ICD-10-CM

## 2019-03-08 DIAGNOSIS — N184 Chronic kidney disease, stage 4 (severe): Secondary | ICD-10-CM

## 2019-03-08 DIAGNOSIS — Z794 Long term (current) use of insulin: Secondary | ICD-10-CM

## 2019-03-08 DIAGNOSIS — Z9114 Patient's other noncompliance with medication regimen: Secondary | ICD-10-CM

## 2019-03-08 DIAGNOSIS — R0789 Other chest pain: Secondary | ICD-10-CM

## 2019-03-08 DIAGNOSIS — I5032 Chronic diastolic (congestive) heart failure: Secondary | ICD-10-CM

## 2019-03-08 DIAGNOSIS — R011 Cardiac murmur, unspecified: Secondary | ICD-10-CM

## 2019-03-08 DIAGNOSIS — E876 Hypokalemia: Secondary | ICD-10-CM

## 2019-03-08 DIAGNOSIS — E1122 Type 2 diabetes mellitus with diabetic chronic kidney disease: Secondary | ICD-10-CM

## 2019-03-08 DIAGNOSIS — Z79899 Other long term (current) drug therapy: Secondary | ICD-10-CM

## 2019-03-08 DIAGNOSIS — E785 Hyperlipidemia, unspecified: Secondary | ICD-10-CM

## 2019-03-08 LAB — PROTIME-INR
INR: 0.9 (ref 0.8–1.2)
Prothrombin Time: 12.5 seconds (ref 11.4–15.2)

## 2019-03-08 LAB — BASIC METABOLIC PANEL
Anion gap: 8 (ref 5–15)
BUN: 38 mg/dL — ABNORMAL HIGH (ref 6–20)
CO2: 24 mmol/L (ref 22–32)
Calcium: 7.6 mg/dL — ABNORMAL LOW (ref 8.9–10.3)
Chloride: 105 mmol/L (ref 98–111)
Creatinine, Ser: 4.76 mg/dL — ABNORMAL HIGH (ref 0.61–1.24)
GFR calc Af Amer: 15 mL/min — ABNORMAL LOW (ref >60)
GFR calc non Af Amer: 13 mL/min — ABNORMAL LOW (ref >60)
Glucose, Bld: 184 mg/dL — ABNORMAL HIGH (ref 70–99)
Potassium: 3.5 mmol/L (ref 3.5–5.1)
Sodium: 137 mmol/L (ref 135–145)

## 2019-03-08 LAB — APTT: aPTT: 35 seconds (ref 24–36)

## 2019-03-08 LAB — GLUCOSE, CAPILLARY
Glucose-Capillary: 139 mg/dL — ABNORMAL HIGH (ref 70–99)
Glucose-Capillary: 171 mg/dL — ABNORMAL HIGH (ref 70–99)
Glucose-Capillary: 173 mg/dL — ABNORMAL HIGH (ref 70–99)
Glucose-Capillary: 159 mg/dL — ABNORMAL HIGH (ref 70–99)

## 2019-03-08 LAB — URINALYSIS, ROUTINE W REFLEX MICROSCOPIC
Bilirubin Urine: NEGATIVE
Glucose, UA: >=500 mg/dL — AB
Ketones, ur: NEGATIVE mg/dL
Leukocytes,Ua: NEGATIVE
Nitrite: NEGATIVE
Protein, ur: >=300 mg/dL — AB
Specific Gravity, Urine: 1.012 (ref 1.005–1.030)
pH: 6 (ref 5.0–8.0)

## 2019-03-08 LAB — COMPREHENSIVE METABOLIC PANEL
ALT: 16 U/L (ref 0–44)
AST: 18 U/L (ref 15–41)
Albumin: 1.6 g/dL — ABNORMAL LOW (ref 3.5–5.0)
Alkaline Phosphatase: 89 U/L (ref 38–126)
Anion gap: 7 (ref 5–15)
BUN: 36 mg/dL — ABNORMAL HIGH (ref 6–20)
CO2: 24 mmol/L (ref 22–32)
Calcium: 7.5 mg/dL — ABNORMAL LOW (ref 8.9–10.3)
Chloride: 108 mmol/L (ref 98–111)
Creatinine, Ser: 4.71 mg/dL — ABNORMAL HIGH (ref 0.61–1.24)
GFR calc Af Amer: 16 mL/min — ABNORMAL LOW (ref >60)
GFR calc non Af Amer: 13 mL/min — ABNORMAL LOW (ref >60)
Glucose, Bld: 147 mg/dL — ABNORMAL HIGH (ref 70–99)
Potassium: 2.9 mmol/L — ABNORMAL LOW (ref 3.5–5.1)
Sodium: 139 mmol/L (ref 135–145)
Total Bilirubin: 0.6 mg/dL (ref 0.3–1.2)
Total Protein: 4.8 g/dL — ABNORMAL LOW (ref 6.5–8.1)

## 2019-03-08 LAB — CBC
HCT: 28.7 % — ABNORMAL LOW (ref 39.0–52.0)
Hemoglobin: 9.3 g/dL — ABNORMAL LOW (ref 13.0–17.0)
MCH: 26.3 pg (ref 26.0–34.0)
MCHC: 32.4 g/dL (ref 30.0–36.0)
MCV: 81.3 fL (ref 80.0–100.0)
Platelets: 192 10*3/uL (ref 150–400)
RBC: 3.53 MIL/uL — ABNORMAL LOW (ref 4.22–5.81)
RDW: 13.1 % (ref 11.5–15.5)
WBC: 6.9 10*3/uL (ref 4.0–10.5)
nRBC: 0 % (ref 0.0–0.2)

## 2019-03-08 MED ORDER — POTASSIUM CHLORIDE 10 MEQ/100ML IV SOLN: 10.0000 meq | INTRAVENOUS | Status: DC

## 2019-03-08 MED ORDER — HYDRALAZINE HCL 50 MG PO TABS
50.0000 mg | ORAL_TABLET | Freq: Three times a day (TID) | ORAL | Status: DC
  Administered 2019-03-08 – 2019-03-15 (×20): 50 mg via ORAL
  Filled 2019-03-08 (×20): qty 1

## 2019-03-08 MED ORDER — POTASSIUM CHLORIDE CRYS ER 20 MEQ PO TBCR
40.0000 meq | EXTENDED_RELEASE_TABLET | Freq: Once | ORAL | Status: AC
  Administered 2019-03-08: 40 meq via ORAL
  Filled 2019-03-08 (×2): qty 2

## 2019-03-08 MED ORDER — POTASSIUM CHLORIDE CRYS ER 20 MEQ PO TBCR: 40.0000 meq | EXTENDED_RELEASE_TABLET | Freq: Every day | ORAL | Status: DC

## 2019-03-08 MED ORDER — LOPERAMIDE HCL 2 MG PO CAPS: 4.0000 mg | ORAL_CAPSULE | ORAL | Status: DC | PRN

## 2019-03-08 MED ORDER — POTASSIUM CHLORIDE 10 MEQ/100ML IV SOLN
10.0000 meq | INTRAVENOUS | Status: AC
  Administered 2019-03-08 (×2): 10 meq via INTRAVENOUS
  Filled 2019-03-08: qty 100

## 2019-03-08 MED ORDER — FUROSEMIDE 10 MG/ML IJ SOLN
80.0000 mg | Freq: Three times a day (TID) | INTRAMUSCULAR | Status: DC
  Administered 2019-03-08 – 2019-03-09 (×2): 80 mg via INTRAVENOUS
  Filled 2019-03-08 (×2): qty 8

## 2019-03-08 NOTE — Plan of Care (Signed)

## 2019-03-08 NOTE — Consult Note (Signed)
[facility] Nephrology Consultation Note  Requesting MD: Dr Evette Doffing, Damita Dunnings Reason for consult: CKD and fluid overload  HPI: Willie Jordan is a 53 y.o. male with history of diabetes, diagnosed around 2008 A1c used to be very high around 12 recently around 8, complicated by retinopathy neuropathy, nephropathy, diastolic CHF, hypertension, hyperlipidemia, proteinuria with biopsy-proven diabetic nephropathy, CKD last creatinine level 3.18 on 01/14/2019, follows with Dr. Johnney Ou at [facility], admitted with shortness of breath, chest pain and fluid overload. On 02/19/2019 patient came to ER for abdominal pain CT scan showed no acute finding and patient was discharged home with Protonix and GI referral.  At that time the serum creatinine level was elevated to 4.07.  Subsequently he was seen at [facility] on 8/31 when he presented with significant edema shortness of breath orthopnea and rales on exam.  He is symptoms were refractory to oral outpatient diuretics.  He was then referred to ED for IV diuresis and further evaluation of his abdominal pain. On admission, 8/31 the serum creatinine level was 4.23.  Significant edema, elevated BNP level and chest x-ray with bilateral pleural effusion.  He received IV Lasix 1.2 L in 24 hours.  The creatinine level has gone up to 4.76 today.  We are consulted for AKI on CKD and management of fluid overload.  Patient reports shortness of breath and dyspnea exertion currently.  He denies chest pain.  Has some nausea but no vomiting.  Dealing with abdominal pain.  Denies dysuria, urgency, frequency.  PMHx:   Past Medical History:  Diagnosis Date  . Diabetic neuropathy ([place]) 03/17/2017  . Hyperlipidemia   . Hypertension   . Microalbuminuria due to type 2 diabetes mellitus ([place]) 01/30/2017  . Neuropathy   . Onychomycosis of toenail 01/30/2017  . Peripheral neuropathy 03/17/2017  . Type 2 diabetes mellitus, uncontrolled ([place])     Past Surgical  History:  Procedure Laterality Date  . AMPUTATION Left 09/29/2016   Procedure: 2nd RAY AMPUTATION LEFT FOOT I&D LEFT FOOT;  Surgeon: Edrick Kins, DPM;  Location: [place] [facility];  Service: Podiatry;  Laterality: Left;  . APPENDECTOMY      Family Hx:  Family History  Problem Relation Age of Onset  . Diabetes Mother   . Diabetes Sister   . Diabetes Brother   . Cancer Maternal Grandmother     Social History:  reports that he quit smoking about 3 years ago. His smoking use included cigarettes. He started smoking about 3 years ago. He has never used smokeless tobacco. He reports that he does not drink alcohol or use drugs.  Allergies:  Allergies  Allergen Reactions  . Aspirin Rash    Medications: Prior to Admission medications   Medication Sig Start Date End Date Taking? Authorizing Provider  acetaminophen (TYLENOL) 500 MG tablet Take 2 tablets (1,000 mg total) by mouth every 8 (eight) hours as needed for mild pain or fever. 09/10/18  Yes Rayburn, Claiborne Billings A, PA-C  alum & mag hydroxide-simeth (MAALOX/MYLANTA) 200-200-20 MG/5ML suspension Take 30 mLs by mouth every 6 (six) hours as needed for indigestion or heartburn.   Yes [provider]  furosemide (LASIX) 40 MG tablet 1 1/2 tabs by mouth in morning only daily Patient taking differently: Take 120 mg by mouth 2 (two) times daily.  04/16/18  Yes Mack Hook, MD  gabapentin (NEURONTIN) 300 MG capsule Take 1 capsule (300 mg total) by mouth 3 (three) times daily. Patient taking differently: Take 300 mg by mouth every morning.  02/19/18  Yes Mack Hook, MD  glipiZIDE (GLUCOTROL) 10 MG tablet Take 10 mg by mouth 2 (two) times daily before a meal.   Yes [provider]  Insulin Lispro Prot & Lispro (HUMALOG MIX 75/25 KWIKPEN) (75-25) 100 UNIT/ML Kwikpen Inject 26 units before breakfast and 24 units before dinner daily Patient taking differently: Inject 24-30 Units into the skin See admin instructions. Sliding scale as  needed 08/04/17  Yes Mack Hook, MD  lisinopril (PRINIVIL,ZESTRIL) 20 MG tablet Take 0.5 tablets (10 mg total) by mouth daily. Patient taking differently: Take 20 mg by mouth daily.  04/16/18  Yes Mack Hook, MD  metoprolol tartrate (LOPRESSOR) 25 MG tablet Take 1 tablet (25 mg total) by mouth 2 (two) times daily. 04/19/18  Yes Mack Hook, MD  omeprazole (PRILOSEC) 20 MG capsule Take 1 capsule (20 mg total) by mouth 2 (two) times daily before a meal. Patient taking differently: Take 20 mg by mouth daily.  02/20/19  Yes [place], Barbette Hair, MD  blood glucose meter kit and supplies Dispense based on patient and insurance preference. Use up to four times daily as directed. (FOR ICD-9 250.00, 250.01). 10/05/16   Patrecia Pour, MD  Insulin Pen Needle 31G X 5 MM MISC Use as directed twice daily 01/12/17   Mack Hook, MD  methocarbamol (ROBAXIN) 750 MG tablet Take 1 tablet (750 mg total) by mouth every 8 (eight) hours as needed for muscle spasms. Patient not taking: Reported on 03/07/2019 09/10/18   Rayburn, Claiborne Billings A, PA-C  oxyCODONE (OXY IR/ROXICODONE) 5 MG immediate release tablet Take 1-2 tablets (5-10 mg total) by mouth every 6 (six) hours as needed for severe pain. Patient not taking: Reported on 03/07/2019 09/10/18   Rayburn, Claiborne Billings A, PA-C  potassium chloride SA (K-DUR,KLOR-CON) 20 MEQ tablet Take 1 tablet (20 mEq total) by mouth daily for 7 days. Patient not taking: Reported on 02/20/2019 09/10/18 09/17/18  Rayburn, Floyce Stakes, PA-C    I have reviewed the patient's current medications.  Labs:  Results for orders placed or performed during the hospital encounter of 03/07/19 (from the past 48 hour(s))  Basic metabolic panel     Status: Abnormal   Collection Time: 03/07/19 11:58 AM  Result Value Ref Range   Sodium 136 135 - 145 mmol/L   Potassium 3.8 3.5 - 5.1 mmol/L   Chloride 105 98 - 111 mmol/L   CO2 17 (L) 22 - 32 mmol/L   Glucose, Bld 235 (H) 70 - 99 mg/dL   BUN 35 (H) 6 -  20 mg/dL   Creatinine, Ser 4.23 (H) 0.61 - 1.24 mg/dL   Calcium 7.7 (L) 8.9 - 10.3 mg/dL   GFR calc non Af Amer 15 (L) >60 mL/min   GFR calc Af Amer 18 (L) >60 mL/min   Anion gap 14 5 - 15    Comment: Performed at [facility], [address] [place], [place] [address]  CBC     Status: Abnormal   Collection Time: 03/07/19 11:58 AM  Result Value Ref Range   WBC 9.2 4.0 - 10.5 K/uL   RBC 4.02 (L) 4.22 - 5.81 MIL/uL   Hemoglobin 10.9 (L) 13.0 - 17.0 g/dL   HCT 34.7 (L) 39.0 - 52.0 %   MCV 86.3 80.0 - 100.0 fL   MCH 27.1 26.0 - 34.0 pg   MCHC 31.4 30.0 - 36.0 g/dL   RDW 13.2 11.5 - 15.5 %   Platelets 223 150 - 400 K/uL  nRBC 0.0 0.0 - 0.2 %    Comment: Performed at [facility], [place] [address], [place], [place] [address]  Troponin I (High Sensitivity)     Status: Abnormal   Collection Time: 03/07/19 11:58 AM  Result Value Ref Range   Troponin I (High Sensitivity) 41 (H) <18 ng/L    Comment: (NOTE) Elevated high sensitivity troponin I (hsTnI) values and significant  changes across serial measurements may suggest ACS but many other  chronic and acute conditions are known to elevate hsTnI results.  Refer to the "Links" section for chest pain algorithms and additional  guidance. Performed at [facility], [place] [address] [place], [place] [address]   Brain natriuretic peptide     Status: Abnormal   Collection Time: 03/07/19 12:30 PM  Result Value Ref Range   B Natriuretic Peptide 602.0 (H) 0.0 - 100.0 pg/mL    Comment: Performed at [facility] [address] [place],  [address]  SARS Coronavirus 2 [facility] order, Performed in [facility] hospital lab) Nasopharyngeal Nasopharyngeal Swab     Status: None   Collection Time: 03/07/19  1:24 PM   Specimen: Nasopharyngeal Swab  Result Value Ref Range   SARS Coronavirus 2 NEGATIVE NEGATIVE    Comment: (NOTE) If result is NEGATIVE SARS-CoV-2 target nucleic acids are NOT DETECTED. The SARS-CoV-2 RNA is  generally detectable in upper and lower  respiratory specimens during the acute phase of infection. The lowest  concentration of SARS-CoV-2 viral copies this assay can detect is 250  copies / mL. A negative result does not preclude SARS-CoV-2 infection  and should not be used as the sole basis for treatment or other  patient management decisions.  A negative result may occur with  improper specimen collection / handling, submission of specimen other  than nasopharyngeal swab, presence of viral mutation(s) within the  areas targeted by this assay, and inadequate number of viral copies  (<250 copies / mL). A negative result must be combined with clinical  observations, patient history, and epidemiological information. If result is POSITIVE SARS-CoV-2 target nucleic acids are DETECTED. The SARS-CoV-2 RNA is generally detectable in upper and lower  respiratory specimens dur ing the acute phase of infection.  Positive  results are indicative of active infection with SARS-CoV-2.  Clinical  correlation with patient history and other diagnostic information is  necessary to determine patient infection status.  Positive results do  not rule out bacterial infection or co-infection with other viruses. If result is PRESUMPTIVE POSTIVE SARS-CoV-2 nucleic acids MAY BE PRESENT.   A presumptive positive result was obtained on the submitted specimen  and confirmed on repeat testing.  While 2019 novel coronavirus  (SARS-CoV-2) nucleic acids may be present in the submitted sample  additional confirmatory testing may be necessary for epidemiological  and / or clinical management purposes  to differentiate between  SARS-CoV-2 and other Sarbecovirus currently known to infect humans.  If clinically indicated additional testing with an alternate test  methodology [id number]) is advised. The SARS-CoV-2 RNA is generally  detectable in upper and lower respiratory sp ecimens during the acute  phase of  infection. The expected result is Negative. Fact Sheet for Patients:  StrictlyIdeas.no Fact Sheet for Healthcare Providers: BankingDealers.co.za This test is not yet approved or cleared by the [place] FDA and has been authorized for detection and/or diagnosis of SARS-CoV-2 by FDA under an Emergency Use Authorization (EUA).  This EUA will remain in effect (meaning this test  can be used) for the duration of the COVID-19 declaration under Section 564(b)(1) of the Act, 21 U.S.C. section 360bbb-3(b)(1), unless the authorization is terminated or revoked sooner. Performed at [facility], [place] [address] [place], [place] [address]   Troponin I (High Sensitivity)     Status: Abnormal   Collection Time: 03/07/19  1:25 PM  Result Value Ref Range   Troponin I (High Sensitivity) 58 (H) <18 ng/L    Comment: (NOTE) Elevated high sensitivity troponin I (hsTnI) values and significant  changes across serial measurements may suggest ACS but many other  chronic and acute conditions are known to elevate hsTnI results.  Refer to the Links section for chest pain algorithms and additional  guidance. Performed at [facility], [place] [address], [place], [place] [address]   Hepatic function panel     Status: Abnormal   Collection Time: 03/07/19  1:25 PM  Result Value Ref Range   Total Protein 5.7 (L) 6.5 - 8.1 g/dL   Albumin 1.9 (L) 3.5 - 5.0 g/dL   AST 26 15 - 41 U/L   ALT 20 0 - 44 U/L   Alkaline Phosphatase 113 38 - 126 U/L   Total Bilirubin 0.3 0.3 - 1.2 mg/dL   Bilirubin, Direct <0.1 0.0 - 0.2 mg/dL   Indirect Bilirubin NOT CALCULATED 0.3 - 0.9 mg/dL    Comment: Performed at [facility] [address], [place], [place] [address]  Lipid panel     Status: Abnormal   Collection Time: 03/07/19  2:15 PM  Result Value Ref Range   Cholesterol 342 (H) 0 - 200 mg/dL   Triglycerides 906 (H) <150 mg/dL   HDL 24 (L) >40 mg/dL   Total  CHOL/HDL Ratio 14.3 RATIO   VLDL UNABLE TO CALCULATE IF TRIGLYCERIDE OVER 400 mg/dL 0 - 40 mg/dL   LDL Cholesterol UNABLE TO CALCULATE IF TRIGLYCERIDE OVER 400 mg/dL 0 - 99 mg/dL    Comment:        Total Cholesterol/HDL:CHD Risk Coronary Heart Disease Risk Table                     Men   Women  1/2 Average Risk   3.4   3.3  Average Risk       5.0   4.4  2 X Average Risk   9.6   7.1  3 X Average Risk  23.4   11.0        Use the calculated Patient Ratio above and the CHD Risk Table to determine the patient's CHD Risk.        ATP III CLASSIFICATION (LDL):  <100     mg/dL   Optimal  100-129  mg/dL   Near or Above                    Optimal  130-159  mg/dL   Borderline  160-189  mg/dL   High  >190     mg/dL   Very High Performed at [facility] [address], [place], [place] [address]   LDL cholesterol, direct     Status: None   Collection Time: 03/07/19  2:15 PM  Result Value Ref Range   Direct LDL 76.6 0 - 99 mg/dL    Comment: Performed at [facility] [address], [place], [place] [address]  Urinalysis, Routine w reflex microscopic     Status: Abnormal   Collection  Time: 03/07/19  4:17 PM  Result Value Ref Range   Color, Urine YELLOW YELLOW   APPearance CLEAR CLEAR   Specific Gravity, Urine 1.010 1.005 - 1.030   pH 7.0 5.0 - 8.0   Glucose, UA >=500 (A) NEGATIVE mg/dL   Hgb urine dipstick SMALL (A) NEGATIVE   Bilirubin Urine NEGATIVE NEGATIVE   Ketones, ur NEGATIVE NEGATIVE mg/dL   Protein, ur >=300 (A) NEGATIVE mg/dL   Nitrite NEGATIVE NEGATIVE   Leukocytes,Ua NEGATIVE NEGATIVE   WBC, UA 0-5 0 - 5 WBC/hpf   Bacteria, UA MANY (A) NONE SEEN   Squamous Epithelial / LPF 0-5 0 - 5   Mucus PRESENT    Hyaline Casts, UA PRESENT     Comment: Performed at [facility] [address] [place], [place] [address]  Glucose, capillary     Status: Abnormal   Collection Time: 03/07/19  5:20 PM  Result Value Ref Range   Glucose-Capillary 148 (H) 70 - 99  mg/dL  Hemoglobin A1c     Status: Abnormal   Collection Time: 03/07/19  7:36 PM  Result Value Ref Range   Hgb A1c MFr Bld 8.0 (H) 4.8 - 5.6 %    Comment: (NOTE) Pre diabetes:          5.7%-6.4% Diabetes:              >6.4% Glycemic control for   <7.0% adults with diabetes    Mean Plasma Glucose 182.9 mg/dL    Comment: Performed at [facility] [address] [place], [place] [address]  Glucose, capillary     Status: Abnormal   Collection Time: 03/07/19  9:08 PM  Result Value Ref Range   Glucose-Capillary 211 (H) 70 - 99 mg/dL  Comprehensive metabolic panel     Status: Abnormal   Collection Time: 03/08/19  5:14 AM  Result Value Ref Range   Sodium 139 135 - 145 mmol/L   Potassium 2.9 (L) 3.5 - 5.1 mmol/L   Chloride 108 98 - 111 mmol/L   CO2 24 22 - 32 mmol/L   Glucose, Bld 147 (H) 70 - 99 mg/dL   BUN 36 (H) 6 - 20 mg/dL   Creatinine, Ser 4.71 (H) 0.61 - 1.24 mg/dL   Calcium 7.5 (L) 8.9 - 10.3 mg/dL   Total Protein 4.8 (L) 6.5 - 8.1 g/dL   Albumin 1.6 (L) 3.5 - 5.0 g/dL   AST 18 15 - 41 U/L   ALT 16 0 - 44 U/L   Alkaline Phosphatase 89 38 - 126 U/L   Total Bilirubin 0.6 0.3 - 1.2 mg/dL   GFR calc non Af Amer 13 (L) >60 mL/min   GFR calc Af Amer 16 (L) >60 mL/min   Anion gap 7 5 - 15    Comment: Performed at [facility], [place] [address] [place], [place] [address]  CBC     Status: Abnormal   Collection Time: 03/08/19  5:14 AM  Result Value Ref Range   WBC 6.9 4.0 - 10.5 K/uL   RBC 3.53 (L) 4.22 - 5.81 MIL/uL   Hemoglobin 9.3 (L) 13.0 - 17.0 g/dL   HCT 28.7 (L) 39.0 - 52.0 %   MCV 81.3 80.0 - 100.0 fL   MCH 26.3 26.0 - 34.0 pg   MCHC 32.4 30.0 - 36.0 g/dL   RDW 13.1 11.5 - 15.5 %   Platelets 192 150 - 400 K/uL   nRBC 0.0 0.0 - 0.2 %  Comment: Performed at [facility], [place] [address] [place], [place] [address]  Protime-INR     Status: None   Collection Time: 03/08/19  5:14 AM  Result Value Ref Range   Prothrombin Time 12.5 11.4 - 15.2 seconds    INR 0.9 0.8 - 1.2    Comment: (NOTE) INR goal varies based on device and disease states. Performed at [facility], [place] [address], [place], [place] [address]   APTT     Status: None   Collection Time: 03/08/19  5:14 AM  Result Value Ref Range   aPTT 35 24 - 36 seconds    Comment: Performed at [facility] [address] [place], [place] [address]  Glucose, capillary     Status: Abnormal   Collection Time: 03/08/19  6:22 AM  Result Value Ref Range   Glucose-Capillary 139 (H) 70 - 99 mg/dL  Glucose, capillary     Status: Abnormal   Collection Time: 03/08/19 11:29 AM  Result Value Ref Range   Glucose-Capillary 171 (H) 70 - 99 mg/dL  Basic metabolic panel     Status: Abnormal   Collection Time: 03/08/19  3:28 PM  Result Value Ref Range   Sodium 137 135 - 145 mmol/L   Potassium 3.5 3.5 - 5.1 mmol/L   Chloride 105 98 - 111 mmol/L   CO2 24 22 - 32 mmol/L   Glucose, Bld 184 (H) 70 - 99 mg/dL   BUN 38 (H) 6 - 20 mg/dL   Creatinine, Ser 4.76 (H) 0.61 - 1.24 mg/dL   Calcium 7.6 (L) 8.9 - 10.3 mg/dL   GFR calc non Af Amer 13 (L) >60 mL/min   GFR calc Af Amer 15 (L) >60 mL/min   Anion gap 8 5 - 15    Comment: Performed at [facility] [address], [place], [place] [address]  Glucose, capillary     Status: Abnormal   Collection Time: 03/08/19  4:47 PM  Result Value Ref Range   Glucose-Capillary 173 (H) 70 - 99 mg/dL     ROS:  Pertinent items noted in HPI and remainder of comprehensive ROS otherwise negative.  Physical Exam: Vitals:   03/08/19 0950 03/08/19 1530  BP: (!) 176/84 (!) 159/87  Pulse: 62 (!) 57  Resp:    Temp:    SpO2:  99%     General exam: Appears calm and comfortable  Respiratory system: Clear to auscultation. Respiratory effort normal. No wheezing or crackle Cardiovascular system: S1 & S2 heard, RRR. LE edema up to knee++ Gastrointestinal system: Abdomen is nondistended, soft and nontender. Normal bowel sounds heard. Central  nervous system: Alert and oriented. No focal neurological deficits. Extremities: Symmetric 5 x 5 power. Skin: No rashes, lesions or ulcers Psychiatry: Judgement and insight appear normal. Mood & affect appropriate.   Assessment/Plan:  #Acute kidney injury on CKD stage IV with fluid overload likely due to CHF exacerbation.  Patient has biopsy-proven diabetic nephropathy causing CKD.  The recent baseline creatinine level is around 3.1 in 01/2019.  Now, the creatinine level elevated to 4.76 associated with significant fluid overload. Continue to hold off ACEI. -I will check urinalysis, bladder scan. -Recent CT scan done on 8/16 with no hydronephrosis or nephrolithiasis. -I will start IV Lasix 80 mg every 8 hourly.  Strict ins and outs and daily weight. -Monitor renal function.  I discussed with the patient that if his renal function continue to worsen then he may need  dialysis.  No indication for dialysis today.  # CHF exacerbation/LE edema: Echo in 09/2018 with EF of 60 to 12% normal systolic function.  Given lower extreme edema I will discontinue amlodipine.  #Hypokalemia due to diuretics.  Last potassium level 3.5.  Continue to monitor.  #Anemia due to CKD: Hemoglobin 9.3.  Check iron studies.  #Secondary hyperparathyroidism: PTH 108 on 7/10/120.  Check phosphorus level.  #Hypertension: Blood pressure is suboptimal.  There was some concern of noncompliance.  Off ACE inhibitor.  Discontinue amlodipine because of worsening lower extreme edema.  Start hydralazine 3 times daily and continue metoprolol and diuretics as above.  #Epigastric pain: This is patient's main concern for last month or so.  I recommend GI consult for further evaluation.  Zane Samson Tanna Furry 03/08/2019, 5:09 PM  [facility].

## 2019-03-08 NOTE — Progress Notes (Signed)
Subjective:  Willie Jordan says that he is feeling well today and has no complaints. He states that he no longer feels short of breath and denies further chest pain as well as headache, nausea, and abdominal discomfort.   Today we discussed that he has a lot of fluid in his chest and throughout his body that will take a few days to get rid of and that if he goes home without removing enough of this fluid, he will do poorly at home and may end up needing to come back to the hospital. He voices understanding.     Objective:  Vital signs in last 24 hours: Vitals:   03/08/19 0034 03/08/19 0611 03/08/19 0726 03/08/19 0950  BP: (!) 145/70 (!) 153/77 (!) 160/82 (!) 176/84  Pulse: (!) 57 (!) 57 (!) 58 62  Resp: 18  20   Temp: 98.8 F (37.1 C) 98.2 F (36.8 C) 99.4 F (37.4 C)   TempSrc: Oral Oral Oral   SpO2: 93% 92% 92%   Weight:  89.4 kg    Height:       Weight change:   Intake/Output Summary (Last 24 hours) at 03/08/2019 1249 Last data filed at 03/08/2019 1239 Gross per 24 hour  Intake 1732 ml  Output 1500 ml  Net 232 ml   Physical Exam: Gen: No acute distress, sitting comfortably in bed.  CV: Nl S1 and S2, 3/5 holosystolic murmur heard at the left sternal border 5th intercostal space. No rubs or gallops. Moderate pitting edema in BLE's bilaterally, somewhat decreased from yesterday. JVD present.  Pulm: Normal work of breathing. Bibasilar crackles present. Dull to percussion from lung bases to midway up lung fields.  Abd: Soft, non-distended, non-tender. Sacral edema present.  Neuro: Alert, conversant with normal speech. Moves all limbs without difficulty.   POCUS reveals bilateral pleural effusions and dilation of IVC.   Labs:  CMP Latest Ref Rng & Units 03/08/2019 03/07/2019 02/19/2019  Glucose 70 - 99 mg/dL 147(H) 235(H) 238(H)  BUN 6 - 20 mg/dL 36(H) 35(H) 36(H)  Creatinine 0.61 - 1.24 mg/dL 4.71(H) 4.23(H) 4.07(H)  Sodium 135 - 145 mmol/L 139 136 138  Potassium 3.5 - 5.1  mmol/L 2.9(L) 3.8 3.1(L)  Chloride 98 - 111 mmol/L 108 105 105  CO2 22 - 32 mmol/L 24 17(L) 22  Calcium 8.9 - 10.3 mg/dL 7.5(L) 7.7(L) 7.9(L)  Total Protein 6.5 - 8.1 g/dL 4.8(L) 5.7(L) 5.5(L)  Total Bilirubin 0.3 - 1.2 mg/dL 0.6 0.3 0.4  Alkaline Phos 38 - 126 U/L 89 113 98  AST 15 - 41 U/L 18 26 29   ALT 0 - 44 U/L 16 20 20    CBC Latest Ref Rng & Units 03/08/2019 03/07/2019 02/19/2019  WBC 4.0 - 10.5 K/uL 6.9 9.2 8.8  Hemoglobin 13.0 - 17.0 g/dL 9.3(L) 10.9(L) 11.0(L)  Hematocrit 39.0 - 52.0 % 28.7(L) 34.7(L) 32.9(L)  Platelets 150 - 400 K/uL 192 223 226   APTT: 35  Seconds (WNL) PT: 12.5 (WNL) INR: 0.9 (WNL) EKG: Sinus bradycardia. T wave inversions in I, aVL, V3-V6 (consider lateral ischemia)   Assessment/Plan:  Active Problems:   Volume overload  Willie Jordan is a 53 y.o. male with a history of DMII, HTN, HDL, and Diabetic FSGS who was admitted yesterday with signs and symptoms of volume overload 2/2 nephrotic syndrome and difficulties with medication adherence. The goals for care on this admission are to diurese to a euvolemic state and optimize Willie Jordan's medications.    Volume overload in  the setting of diabetic FSGS and nephrotic syndrome:  - Continue diuresis w/ furosemide 120 QD - Strict I/O's - Given the patient's worsening kidney function (Cr 4.71 from 4.23 on admission, 2.98 5 months ago) secondary to FSGS, adequate diuresis may prove challenging. We will consult nephrology and appreciate their recommendations.   Hypokalemia:  - 2.9 today, replete as necessary - Repeat CMP in AM - Currently on continuous cardiac monitoring  DM Type II:  - Lantus 10 units - SSI - CBG monitoring  HTN:  - amlodipine 10 mg QD  HLD:  - Crestor 20 mg QD  DVT ppx: heparin subQ q8h Diet: Renal/carb modified w/ fluid restriction    LOS: 1 day   Willie Jordan, Medical Student 03/08/2019, 12:49 PM

## 2019-03-09 ENCOUNTER — Inpatient Hospital Stay (HOSPITAL_COMMUNITY): Payer: Self-pay

## 2019-03-09 DIAGNOSIS — D649 Anemia, unspecified: Secondary | ICD-10-CM

## 2019-03-09 DIAGNOSIS — R079 Chest pain, unspecified: Secondary | ICD-10-CM

## 2019-03-09 DIAGNOSIS — N2581 Secondary hyperparathyroidism of renal origin: Secondary | ICD-10-CM

## 2019-03-09 DIAGNOSIS — I509 Heart failure, unspecified: Secondary | ICD-10-CM

## 2019-03-09 DIAGNOSIS — R0602 Shortness of breath: Secondary | ICD-10-CM

## 2019-03-09 DIAGNOSIS — R197 Diarrhea, unspecified: Secondary | ICD-10-CM

## 2019-03-09 LAB — IRON AND TIBC
Iron: 41 ug/dL — ABNORMAL LOW (ref 45–182)
Saturation Ratios: 18 % (ref 17.9–39.5)
TIBC: 225 ug/dL — ABNORMAL LOW (ref 250–450)
UIBC: 184 ug/dL

## 2019-03-09 LAB — RENAL FUNCTION PANEL
Albumin: 1.7 g/dL — ABNORMAL LOW (ref 3.5–5.0)
Anion gap: 8 (ref 5–15)
BUN: 39 mg/dL — ABNORMAL HIGH (ref 6–20)
CO2: 24 mmol/L (ref 22–32)
Calcium: 7.7 mg/dL — ABNORMAL LOW (ref 8.9–10.3)
Chloride: 109 mmol/L (ref 98–111)
Creatinine, Ser: 4.86 mg/dL — ABNORMAL HIGH (ref 0.61–1.24)
GFR calc Af Amer: 15 mL/min — ABNORMAL LOW (ref >60)
GFR calc non Af Amer: 13 mL/min — ABNORMAL LOW (ref >60)
Glucose, Bld: 122 mg/dL — ABNORMAL HIGH (ref 70–99)
Phosphorus: 5.1 mg/dL — ABNORMAL HIGH (ref 2.5–4.6)
Potassium: 3.3 mmol/L — ABNORMAL LOW (ref 3.5–5.1)
Sodium: 141 mmol/L (ref 135–145)

## 2019-03-09 LAB — ECHOCARDIOGRAM COMPLETE
Height: 64 in
Weight: 3171.2 oz

## 2019-03-09 LAB — GLUCOSE, CAPILLARY
Glucose-Capillary: 122 mg/dL — ABNORMAL HIGH (ref 70–99)
Glucose-Capillary: 180 mg/dL — ABNORMAL HIGH (ref 70–99)
Glucose-Capillary: 159 mg/dL — ABNORMAL HIGH (ref 70–99)
Glucose-Capillary: 125 mg/dL — ABNORMAL HIGH (ref 70–99)

## 2019-03-09 LAB — FERRITIN: Ferritin: 139 ng/mL (ref 24–336)

## 2019-03-09 LAB — MAGNESIUM: Magnesium: 3.3 mg/dL — ABNORMAL HIGH (ref 1.7–2.4)

## 2019-03-09 MED ORDER — DARBEPOETIN ALFA 60 MCG/0.3ML IJ SOSY
60.0000 ug | PREFILLED_SYRINGE | Freq: Once | INTRAMUSCULAR | Status: AC
  Administered 2019-03-09: 60 ug via SUBCUTANEOUS
  Filled 2019-03-09: qty 0.3

## 2019-03-09 MED ORDER — POTASSIUM CHLORIDE 20 MEQ/15ML (10%) PO SOLN
40.0000 meq | Freq: Two times a day (BID) | ORAL | Status: DC
  Administered 2019-03-09 – 2019-03-16 (×16): 40 meq via ORAL
  Filled 2019-03-09 (×15): qty 30

## 2019-03-09 MED ORDER — FENOFIBRATE 54 MG PO TABS
54.0000 mg | ORAL_TABLET | Freq: Every day | ORAL | Status: DC
  Administered 2019-03-09 – 2019-03-16 (×8): 54 mg via ORAL
  Filled 2019-03-09 (×8): qty 1

## 2019-03-09 MED ORDER — PANTOPRAZOLE SODIUM 40 MG PO TBEC
40.0000 mg | DELAYED_RELEASE_TABLET | Freq: Every day | ORAL | Status: DC
  Administered 2019-03-09 – 2019-03-16 (×8): 40 mg via ORAL
  Filled 2019-03-09 (×8): qty 1

## 2019-03-09 MED ORDER — SODIUM CHLORIDE 0.9 % IV SOLN
250.0000 mg | Freq: Every day | INTRAVENOUS | Status: AC
  Administered 2019-03-09 – 2019-03-10 (×2): 250 mg via INTRAVENOUS
  Filled 2019-03-09 (×2): qty 20

## 2019-03-09 MED ORDER — ROSUVASTATIN CALCIUM 20 MG PO TABS
40.0000 mg | ORAL_TABLET | Freq: Every day | ORAL | Status: DC
  Administered 2019-03-09 – 2019-03-15 (×7): 40 mg via ORAL
  Filled 2019-03-09 (×8): qty 2

## 2019-03-09 MED ORDER — FUROSEMIDE 10 MG/ML IJ SOLN
120.0000 mg | Freq: Three times a day (TID) | INTRAVENOUS | Status: DC
  Administered 2019-03-09 – 2019-03-10 (×3): 120 mg via INTRAVENOUS
  Filled 2019-03-09: qty 2
  Filled 2019-03-09: qty 12
  Filled 2019-03-09: qty 4
  Filled 2019-03-09: qty 10
  Filled 2019-03-09: qty 12

## 2019-03-09 NOTE — Progress Notes (Signed)
Echocardiogram 2D Echocardiogram has been performed.  Willie Jordan Willie Jordan 03/09/2019, 12:12 PM

## 2019-03-09 NOTE — Progress Notes (Signed)
Subjective:  Mr. Weatherley says that he is feeling well today and is sitting comfortably in the chair. He denies shortness of breath and chest pain. He reports that he had three bouts of diarrhea last night as well as after meals, which he attributes to the Maalox he takes to alleviate his epigastric pain and reflux. Today we discussed the ongoing process of his diuresis and the need to continue adjusting his medications to get more fluid off. He voices understanding and agrees with the plan.     Objective:  Vital signs in last 24 hours: Vitals:   03/08/19 1530 03/08/19 1946 03/09/19 0416 03/09/19 0417  BP: (!) 159/87 (!) 155/81  (!) 151/82  Pulse: (!) 57 (!) 57  (!) 58  Resp:    18  Temp:  97.8 F (36.6 C)  98.4 F (36.9 C)  TempSrc:  Oral  Oral  SpO2: 99% 96%  93%  Weight:   89.9 kg   Height:       Weight change: -1.361 kg  Intake/Output Summary (Last 24 hours) at 03/09/2019 0648 Last data filed at 03/09/2019 0600 Gross per 24 hour  Intake 1292 ml  Output 1925 ml  Net -633 ml   Physical Exam: Gen: No acute distress, sitting comfortably in bed.  CV: Nl S1 and S2, 3/5 holosystolic murmur heard at the left sternal border 5th intercostal space. No rubs or gallops. Moderate pitting edema in BLE's bilaterally, somewhat decreased from yesterday.  Pulm: Normal work of breathing. Bibasilar crackles present. Dull to percussion from lung bases to midway up lung fields.  Abd: Soft, non-distended, non-tender.  Neuro: Alert, conversant with normal speech. Moves all limbs without difficulty.   POCUS (9/1) reveals bilateral pleural effusions and dilation of IVC.   Labs:  CMP Latest Ref Rng & Units 03/09/2019 03/08/2019 03/08/2019  Glucose 70 - 99 mg/dL 122(H) 184(H) 147(H)  BUN 6 - 20 mg/dL 39(H) 38(H) 36(H)  Creatinine 0.61 - 1.24 mg/dL 4.86(H) 4.76(H) 4.71(H)  Sodium 135 - 145 mmol/L 141 137 139  Potassium 3.5 - 5.1 mmol/L 3.3(L) 3.5 2.9(L)  Chloride 98 - 111 mmol/L 109 105 108  CO2 22 - 32  mmol/L 24 24 24   Calcium 8.9 - 10.3 mg/dL 7.7(L) 7.6(L) 7.5(L)  Total Protein 6.5 - 8.1 g/dL - - 4.8(L)  Total Bilirubin 0.3 - 1.2 mg/dL - - 0.6  Alkaline Phos 38 - 126 U/L - - 89  AST 15 - 41 U/L - - 18  ALT 0 - 44 U/L - - 16   CBC Latest Ref Rng & Units 03/08/2019 03/07/2019 02/19/2019  WBC 4.0 - 10.5 K/uL 6.9 9.2 8.8  Hemoglobin 13.0 - 17.0 g/dL 9.3(L) 10.9(L) 11.0(L)  Hematocrit 39.0 - 52.0 % 28.7(L) 34.7(L) 32.9(L)  Platelets 150 - 400 K/uL 192 223 226   EKG (9/1): Sinus bradycardia. 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 male with a history of DMII, HTN, HDL, and Diabetic FSGS who was admitted 2 days ago with signs and symptoms of volume overload 2/2 nephrotic syndrome and difficulties with medication adherence. The goals for care on this admission are to diurese to a euvolemic state and optimize Mr. Mathes's medications. On hospital day  2, symptoms are improved, though there has been minimal weight change and there are still signs of volume overload. We will increase his dose of Lasix and reassess before adding another agent. He may need to start dialysis in the coming weeks, and nephrology has discussed this possibility with him.    Volume overload in the setting of diabetic FSGS and nephrotic syndrome:  - We are appreciating nephrology's recommendations for diuresis - Lasix 120 mg q8h per nephrology - Strict I/O's, daily weights - Goal weight 185 lbs.   Hypokalemia:  - 3.3 from 2.9 yesterday. Continue to replete as necessary - Repeat CMP in AM - Currently on continuous cardiac monitoring  Anemia: - Hemoglobin 9.3 - Ferritin 139 (wnl), Iron low at 41, TIBC low at 225 - Darboepoetin  Alfa 60 mcg and ferric gluconate 250 mg x 2, repeat CBC  Diarrhea: - Imodium prn  New murmur in setting of CHF:  - 3/5 holosystolic murmur at left sternal border, 5th intercostal space - T wave changes on EKG (9/1) - Echo in 09/2018 revealed EF of 60-65% - Repeat echo to evaluate for exacerbation  DM Type II:  - Lantus 10 units - SSI - CBG monitoring  Secondary hyperparathyroidism: - PTH 108 on 01/14/19 - continue to monitor Ca, Phos, Mg  HTN:  - per nephrology, d/c amlodipine, start hydralazine 50 mg q8h  HLD:  - Increase Crestor from 20 mg to 40 mg QD - Start fenofibrate 54 mg QD (triglycerides >900)  DVT ppx: heparin subQ q8h Diet: Renal/carb modified w/ fluid restriction    LOS: 2 days   Kendell Bane, Medical Student 03/09/2019, 6:48 AM

## 2019-03-09 NOTE — Discharge Planning (Signed)
EDCM reinstated pt in [facility] program as his last enrollment was 2018.

## 2019-03-09 NOTE — Progress Notes (Signed)
[facility] NEPHROLOGY PROGRESS NOTE  Assessment/ Plan: Pt is a 53 y.o. 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 Johnney Ou at [facility], admitted with fluid overload and AKI on CKD.  #Acute kidney injury on CKD stage IV with fluid overload likely due to CHF exacerbation.  Patient has biopsy-proven diabetic nephropathy causing CKD.  The recent baseline creatinine level is around 3.1 in 01/2019.  Now, the creatinine level elevated to 4.76 on admission associated with significant fluid overload. Continue to hold off ACEI. -Urinalysis with known proteinuria but no RBC or WBC. -Recent CT scan done on 8/16 with no hydronephrosis or nephrolithiasis. -Agree with IV Lasix 120 mg IV every 8 hourly.  Urine output was around 2 L in last 24 hours. Strict ins and outs and daily weight. -Monitor renal function.  I discussed with the patient that if his renal function continue to worsen then he may need dialysis.  No indication for dialysis today.  # CHF exacerbation/LE edema: Echo in 09/2018 with EF of 60 to 123456 normal systolic function.  Given lower extreme edema I will discontinue amlodipine.  Diuretics as above.  #Hypokalemia due to diuretics.  Last potassium level 3.3.  Replete KCl. Continue to monitor.  #Anemia due to CKD: Hemoglobin 9.3.    Iron saturation 18%, start IV iron and a dose of Aranesp.  #Secondary hyperparathyroidism: PTH 108 on 7/10/120.    Phosphorus level mildly elevated.  Monitor.   #Hypertension: Blood pressure is suboptimal.  There was some concern of noncompliance.  Off ACE inhibitor.  Discontinued amlodipine because of worsening lower extreme edema.  Started hydralazine 3 times daily and continue metoprolol and diuretics as above.  #Epigastric pain: This is patient's main concern for last month or so.  I recommend  GI consult for further evaluation   Subjective: Seen and examined at bedside.  He has around 2 L of urine output.  He still having significant lower extremity edema.  Denies nausea, vomiting, chest pain or shortness of breath. Objective Vital signs in last 24 hours: Vitals:   03/09/19 0416 03/09/19 0417 03/09/19 0850 03/09/19 0941  BP:  (!) 151/82 (!) 149/73 (!) 159/81  Pulse:  (!) 58 (!) 57 (!) 58  Resp:  18 18   Temp:  98.4 F (36.9 C) 97.8 F (36.6 C)   TempSrc:  Oral Oral   SpO2:  93% 96%   Weight: 89.9 kg     Height:       Weight change: -1.361 kg  Intake/Output Summary (Last 24 hours) at 03/09/2019 0957 Last data filed at 03/09/2019 0900 Gross per 24 hour  Intake 955.5 ml  Output 1925 ml  Net -969.5 ml       Labs: Basic Metabolic Panel: Recent Labs  Lab 03/08/19 0514 03/08/19 1528 03/09/19 0433  NA 139 137 141  K 2.9* 3.5 3.3*  CL 108 105 109  CO2 24 24 24   GLUCOSE 147* 184* 122*  BUN 36* 38* 39*  CREATININE 4.71* 4.76* 4.86*  CALCIUM 7.5* 7.6* 7.7*  PHOS  --   --  5.1*   Liver Function Tests: Recent Labs  Lab 03/07/19 1325 03/08/19 0514 03/09/19 0433  AST 26 18  --   ALT 20 16  --   ALKPHOS 113 89  --   BILITOT 0.3 0.6  --   PROT 5.7* 4.8*  --  ALBUMIN 1.9* 1.6* 1.7*   No results for input(s): LIPASE, AMYLASE in the last 168 hours. No results for input(s): AMMONIA in the last 168 hours. CBC: Recent Labs  Lab 03/07/19 1158 03/08/19 0514  WBC 9.2 6.9  HGB 10.9* 9.3*  HCT 34.7* 28.7*  MCV 86.3 81.3  PLT 223 192   Cardiac Enzymes: No results for input(s): CKTOTAL, CKMB, CKMBINDEX, TROPONINI in the last 168 hours. CBG: Recent Labs  Lab 03/07/19 2108 03/08/19 0622 03/08/19 1129 03/08/19 1647 03/08/19 2125  GLUCAP 211* 139* 171* 173* 159*    Iron Studies:  Recent Labs    03/09/19 0433  IRON 41*  TIBC 225*  FERRITIN 139   Studies/Results: Dg Chest 2 View  Result Date: 03/07/2019 CLINICAL DATA:  Shortness of breath, chest  pain EXAM: CHEST - 2 VIEW COMPARISON:  09/07/2018 FINDINGS: Cardiac silhouette remains mildly enlarged. Mild patchy left basilar opacity. Trace bilateral pleural effusions. No pneumothorax. IMPRESSION: 1. Mild patchy left basilar opacity, which may reflect atelectasis and/or pneumonia in the appropriate clinical setting. 2. Trace bilateral pleural effusions. Electronically Signed   By: Davina Poke M.D.   On: 03/07/2019 12:12    Medications: Infusions: . furosemide 120 mg (03/09/19 0942)    Scheduled Medications: . gabapentin  300 mg Oral TID  . heparin injection (subcutaneous)  5,000 Units Subcutaneous Q8H  . hydrALAZINE  50 mg Oral Q8H  . insulin aspart  0-15 Units Subcutaneous TID WC  . insulin glargine  10 Units Subcutaneous QHS  . metoprolol tartrate  25 mg Oral BID  . pantoprazole  40 mg Oral Daily  . potassium chloride  40 mEq Oral BID  . rosuvastatin  20 mg Oral q1800    have reviewed scheduled and prn medications.  Physical Exam: General:NAD, comfortable Heart:RRR, s1s2 nl, no rubs Lungs:clear b/l, no crackle Abdomen:soft, Non-tender, non-distended Extremities: Bilateral lower extremity pitting edema++ Neurology: Alert awake and following commands  Zahara Rembert Tanna Furry 03/09/2019,9:57 AM  LOS: 2 days  Pager: [phone]

## 2019-03-10 LAB — RENAL FUNCTION PANEL
Albumin: 1.7 g/dL — ABNORMAL LOW (ref 3.5–5.0)
Anion gap: 12 (ref 5–15)
BUN: 41 mg/dL — ABNORMAL HIGH (ref 6–20)
CO2: 23 mmol/L (ref 22–32)
Calcium: 7.5 mg/dL — ABNORMAL LOW (ref 8.9–10.3)
Chloride: 105 mmol/L (ref 98–111)
Creatinine, Ser: 5.32 mg/dL — ABNORMAL HIGH (ref 0.61–1.24)
GFR calc Af Amer: 13 mL/min — ABNORMAL LOW (ref >60)
GFR calc non Af Amer: 12 mL/min — ABNORMAL LOW (ref >60)
Glucose, Bld: 182 mg/dL — ABNORMAL HIGH (ref 70–99)
Phosphorus: 5 mg/dL — ABNORMAL HIGH (ref 2.5–4.6)
Potassium: 3.9 mmol/L (ref 3.5–5.1)
Sodium: 140 mmol/L (ref 135–145)

## 2019-03-10 LAB — GLUCOSE, CAPILLARY
Glucose-Capillary: 159 mg/dL — ABNORMAL HIGH (ref 70–99)
Glucose-Capillary: 181 mg/dL — ABNORMAL HIGH (ref 70–99)
Glucose-Capillary: 200 mg/dL — ABNORMAL HIGH (ref 70–99)
Glucose-Capillary: 177 mg/dL — ABNORMAL HIGH (ref 70–99)
Glucose-Capillary: 170 mg/dL — ABNORMAL HIGH (ref 70–99)

## 2019-03-10 MED ORDER — FUROSEMIDE 10 MG/ML IJ SOLN
80.0000 mg | Freq: Two times a day (BID) | INTRAMUSCULAR | Status: DC
  Administered 2019-03-10 – 2019-03-11 (×2): 80 mg via INTRAVENOUS
  Filled 2019-03-10 (×2): qty 8

## 2019-03-10 NOTE — Progress Notes (Addendum)
   Subjective:  Pt seen on rounds this AM. Sleeping when we entered the room. The patient says he feels better today. He feels like the swelling in his legs has improved. No SOB. Doesn't have any other complaints today. Explained diuresis has been difficult because of decreasing kidney function, and that we would coordinate with the nephrologist. Patient voiced understanding.  Says most of his family is in [place]. Has an aunt who lives here.     Objective:    Vital Signs (last 24 hours): Vitals:   03/10/19 0126 03/10/19 0552 03/10/19 0908 03/10/19 1218  BP:  (!) 153/82 (!) 154/78 (!) 149/78  Pulse:  (!) 58 63 60  Resp:  18  18  Temp:  98.4 F (36.9 C)  98.4 F (36.9 C)  TempSrc:  Oral  Oral  SpO2:  90%  93%  Weight: 90.7 kg     Height:        Physical Exam: General Alert and answers questions appropriately, no acute distress  Cardiac Regular rate and rhythm, 2/6 holosystolic murmur, no rubs/gallops  Pulmonary CTAB, dullness to percusion in bilateral lung bases  Abdominal Soft, non-tender, without distention  Extremities 2+ pitting edema bilaterally up to proximal thighs      Assessment/Plan:   Principal Problem:   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 male with a history of DMII, HTN, HDL, and Diabetic FSGS who was admitted 2 days ago with signs and symptoms of volume overload 2/2 nephrotic syndrome and difficulties with medication adherence. The goals for care on this admission are to diurese to a euvolemic state and optimize Willie Jordan's medications.   Volume overload in the setting of diabetic FSGS and nephrotic syndrome:  - Patient without significant improvement in volume status with increased dose of lasix. Kidney function continues to decline - We appreciate nephrology's  recommendations for diuresis - Lasix decreased per nephro rec to 80 mg BID from lasix 120 mg Q8HR yesterday. - Strict I/O's, daily weights  Hypokalemia:  - Stable, repleting as necessary - Repeat RFP in AM  Anemia: - Hemoglobin 9.3 - Ferritin 139 (wnl), Iron low at 41, TIBC low at 225 - Darboepoetin Alfa 60 mcg and ferric gluconate 250 mg x 2  New murmur in setting of CHF:  - 3/5 holosystolic murmur at left sternal border, 5th intercostal space - T wave changes on EKG (9/1) - Echo with unchanged EF  DM Type II:  - Lantus 10 units - SSI - CBG monitoring  Secondary hyperparathyroidism: - PTH 108 on 01/14/19 - continue to monitor Ca, Phos, Mg  HTN:  - On hydralazine 50 mg Q8HR  HLD:  - Increase Crestor from 20 mg to 40 mg QD - Started fenofibrate 54 mg QD (triglycerides >900)  DVT ppx: heparin subQ q8h Diet: Renal/carb modified w/ fluid restriction   Dispo: Anticipated discharge pending clinical improvement.   Jeanmarie Hubert, MD 03/10/2019, 1:34 PM Pager: [phone]

## 2019-03-10 NOTE — Progress Notes (Signed)
[facility] NEPHROLOGY PROGRESS NOTE  Assessment/ Plan: Pt is a 53 y.o. 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 Johnney Ou at [facility], admitted with fluid overload and AKI on CKD.  #Acute kidney injury on CKD stage IV with fluid overload likely due to CHF exacerbation.  Patient has biopsy-proven diabetic nephropathy causing CKD.  The recent baseline creatinine level is around 3.1 in 01/2019.  Now, the creatinine level elevated to 4.76 on admission associated with significant fluid overload. Continue to hold off ACEI. -Urinalysis with known proteinuria but no RBC or WBC. -Recent CT scan done on 8/16 with no hydronephrosis or nephrolithiasis. -Treated with IV Lasix 120 mg IV every 8 hourly.  Urine output was around 2.5 L in last 24 hours. Strict ins and outs and daily weight.  Clinically responding well however with worsening creatinine level to 5.3 today.  Lowered the dose of Lasix to 80 mg IV twice a day, lower extremity edema is better. -I discussed with the patient that if his renal function continue to worsen then he may need dialysis.  No indication for dialysis today.  # CHF exacerbation/LE edema: Echo  EF of 60 to 123456 normal systolic function.  Given lower extreme edema I discontinued amlodipine.  Diuretics as above.  #Hypokalemia due to diuretics.  K 3.9.  Continue KCl. Continue to monitor.  #Anemia due to CKD: Hemoglobin 9.3.    Iron saturation 18%, started IV iron and a dose of Aranesp on 9/2.  #Secondary hyperparathyroidism: PTH 108 on 7/10/120.    Phosphorus level mildly elevated.  Monitor.   #Hypertension: Blood pressure is suboptimal.  There was some concern of noncompliance.  Off ACE inhibitor.  Discontinued amlodipine because of worsening lower extreme edema.  Started hydralazine 3 times daily and continue  metoprolol and diuretics as above. Monitor BP.  #Epigastric pain: This is patient's main concern for last month or so.  I recommend GI consult for further evaluation   Subjective: Seen and examined at bedside.  He is responding well with diuretics however with worsening renal failure.  Denied headache, dizziness, nausea, vomiting, chest pain, shortness of breath. Objective Vital signs in last 24 hours: Vitals:   03/09/19 1215 03/09/19 2019 03/10/19 0126 03/10/19 0552  BP: (!) 166/77 (!) 150/84  (!) 153/82  Pulse: 61 (!) 57  (!) 58  Resp: 20 20  18   Temp: 97.8 F (36.6 C) 98.2 F (36.8 C)  98.4 F (36.9 C)  TempSrc: Oral Oral  Oral  SpO2: 95% 92%  90%  Weight:   90.7 kg   Height:       Weight change: 0.771 kg  Intake/Output Summary (Last 24 hours) at 03/10/2019 0801 Last data filed at 03/10/2019 0600 Gross per 24 hour  Intake 863.11 ml  Output 2475 ml  Net -1611.89 ml       Labs: Basic Metabolic Panel: Recent Labs  Lab 03/08/19 1528 03/09/19 0433 03/10/19 0458  NA 137 141 140  K 3.5 3.3* 3.9  CL 105 109 105  CO2 24 24 23   GLUCOSE 184* 122* 182*  BUN 38* 39* 41*  CREATININE 4.76* 4.86* 5.32*  CALCIUM 7.6* 7.7* 7.5*  PHOS  --  5.1* 5.0*   Liver Function Tests: Recent Labs  Lab 03/07/19 1325 03/08/19 0514 03/09/19 0433 03/10/19 0458  AST 26 18  --   --  ALT 20 16  --   --   ALKPHOS 113 89  --   --   BILITOT 0.3 0.6  --   --   PROT 5.7* 4.8*  --   --   ALBUMIN 1.9* 1.6* 1.7* 1.7*   No results for input(s): LIPASE, AMYLASE in the last 168 hours. No results for input(s): AMMONIA in the last 168 hours. CBC: Recent Labs  Lab 03/07/19 1158 03/08/19 0514  WBC 9.2 6.9  HGB 10.9* 9.3*  HCT 34.7* 28.7*  MCV 86.3 81.3  PLT 223 192   Cardiac Enzymes: No results for input(s): CKTOTAL, CKMB, CKMBINDEX, TROPONINI in the last 168 hours. CBG: Recent Labs  Lab 03/09/19 0632 03/09/19 1103 03/09/19 1657 03/09/19 2157 03/10/19 0633  GLUCAP 122* 180* 159*  125* 159*    Iron Studies:  Recent Labs    03/09/19 0433  IRON 41*  TIBC 225*  FERRITIN 139   Studies/Results: No results found.  Medications: Infusions: . ferric gluconate (FERRLECIT/NULECIT) IV 250 mg (03/09/19 1118)    Scheduled Medications: . fenofibrate  54 mg Oral Daily  . furosemide  80 mg Intravenous Q12H  . gabapentin  300 mg Oral TID  . heparin injection (subcutaneous)  5,000 Units Subcutaneous Q8H  . hydrALAZINE  50 mg Oral Q8H  . insulin aspart  0-15 Units Subcutaneous TID WC  . insulin glargine  10 Units Subcutaneous QHS  . metoprolol tartrate  25 mg Oral BID  . pantoprazole  40 mg Oral Daily  . potassium chloride  40 mEq Oral BID  . rosuvastatin  40 mg Oral q1800    have reviewed scheduled and prn medications.  Physical Exam: General:NAD, comfortable, able to lie flat Heart:RRR, s1s2 nl, no rubs Lungs:clear b/l, no crackle Abdomen:soft, Non-tender, non-distended Extremities: Bilateral lower extremity pitting edema+ Neurology: Alert awake and following commands  Dron Tanna Furry 03/10/2019,8:01 AM  LOS: 3 days  Pager: [phone]

## 2019-03-11 ENCOUNTER — Ambulatory Visit: Payer: Self-pay | Admitting: Gastroenterology

## 2019-03-11 DIAGNOSIS — E8770 Fluid overload, unspecified: Secondary | ICD-10-CM

## 2019-03-11 DIAGNOSIS — I129 Hypertensive chronic kidney disease with stage 1 through stage 4 chronic kidney disease, or unspecified chronic kidney disease: Secondary | ICD-10-CM

## 2019-03-11 LAB — RENAL FUNCTION PANEL
Albumin: 1.7 g/dL — ABNORMAL LOW (ref 3.5–5.0)
Anion gap: 6 (ref 5–15)
BUN: 42 mg/dL — ABNORMAL HIGH (ref 6–20)
CO2: 25 mmol/L (ref 22–32)
Calcium: 7.6 mg/dL — ABNORMAL LOW (ref 8.9–10.3)
Chloride: 108 mmol/L (ref 98–111)
Creatinine, Ser: 5.38 mg/dL — ABNORMAL HIGH (ref 0.61–1.24)
GFR calc Af Amer: 13 mL/min — ABNORMAL LOW (ref >60)
GFR calc non Af Amer: 11 mL/min — ABNORMAL LOW (ref >60)
Glucose, Bld: 138 mg/dL — ABNORMAL HIGH (ref 70–99)
Phosphorus: 4.2 mg/dL (ref 2.5–4.6)
Potassium: 4.1 mmol/L (ref 3.5–5.1)
Sodium: 139 mmol/L (ref 135–145)

## 2019-03-11 LAB — GLUCOSE, CAPILLARY
Glucose-Capillary: 133 mg/dL — ABNORMAL HIGH (ref 70–99)
Glucose-Capillary: 193 mg/dL — ABNORMAL HIGH (ref 70–99)
Glucose-Capillary: 150 mg/dL — ABNORMAL HIGH (ref 70–99)
Glucose-Capillary: 170 mg/dL — ABNORMAL HIGH (ref 70–99)

## 2019-03-11 MED ORDER — FUROSEMIDE 10 MG/ML IJ SOLN
80.0000 mg | Freq: Three times a day (TID) | INTRAMUSCULAR | Status: DC
  Administered 2019-03-11 – 2019-03-12 (×3): 80 mg via INTRAVENOUS
  Filled 2019-03-11 (×3): qty 8

## 2019-03-11 MED ORDER — METOLAZONE 5 MG PO TABS
5.0000 mg | ORAL_TABLET | Freq: Once | ORAL | Status: AC
  Administered 2019-03-11: 5 mg via ORAL
  Filled 2019-03-11: qty 1

## 2019-03-11 NOTE — Progress Notes (Signed)
  Subjective:  Pt seen at the bedside on rounds this AM. Interpreter used on Ipad. Pt says he feels okay today but swelling is about the same. We discussed the nephrologist increased lasix and want the patient to take metolazone. Pt says he talked to Nephro this AM, and he told him that he may go home tomorrow.    Objective:    Vital Signs (last 24 hours): Vitals:   03/10/19 2025 03/11/19 0035 03/11/19 0702 03/11/19 1133  BP: (!) 171/83 (!) 142/76 (!) 151/79 (!) 171/83  Pulse: 63 64 66 62  Resp: 18  18 18   Temp: 98.3 F (36.8 C)  99.8 F (37.7 C) 98.2 F (36.8 C)  TempSrc: Oral  Oral Oral  SpO2: 94%  92% 91%  Weight:   90.1 kg   Height:         Physical Exam: General Alert and answers questions appropriately, no acute distress  Cardiac Regular rate and rhythm, 2/6 holosystolic murmur  Pulmonary CTAB, dullness to percussion in bilateral lung bases  Extremities 2+ pitting edema bilaterally up to thighs      Assessment/Plan:   Principal Problem:   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  # Volume overload in the setting of diabetic FSGS and nephrotic syndrome: Patient without significant improvement in volume status, weight unchanged from prior day.  Creatinine is stable from yesterday. * We will increase Lasix from 80 mg BID IV to 3 times daily per nephrology recommendation and added metolazone 5 mg.  We appreciate nephrology's assistance in managing this patient. * Strict I/Os, daily weights  # Hypertension: On hydralazine 50 mg every 8 hours.  Blood pressure stable.  Dispo: Anticipated discharge pending clinical improvement   Jeanmarie Hubert, MD 03/11/2019, 1:37 PM Pager: [phone]

## 2019-03-11 NOTE — Progress Notes (Signed)
**Willie Willie** Willie Willie  Assessment/ Plan: Pt is a 53 y.o. 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 Johnney Ou at [facility], admitted with fluid overload and AKI on CKD.  #Acute kidney injury on CKD stage IV with fluid overload likely due to CHF exacerbation.  Patient has biopsy-proven diabetic nephropathy causing CKD.  The recent baseline creatinine level is around 3.1 in 01/2019.  Now, the creatinine level elevated to 4.76 on admission associated with significant fluid overload. Continue to hold off ACEI. -Urinalysis with known proteinuria but no RBC or WBC. -Recent CT scan done on 8/16 with no hydronephrosis or nephrolithiasis. -Treating with IV diuretics with good urine output however lower extremity edema is a still not improved.  I will increase the Lasix IV to every 8 hour and add a dose of metolazone today.  Serum creatinine level 5.3 today.  He is not uremic. -I discussed with the patient that if his renal function continue to worsen then he may need dialysis.  No indication for dialysis today.  # CHF exacerbation/LE edema: Echo  EF of 60 to 123456 normal systolic function.  Given lower extreme edema I discontinued amlodipine.  Diuretics as above.  #Hypokalemia due to diuretics.   Continue KCl. Continue to monitor.  #Anemia due to CKD: Hemoglobin 9.3.    Iron saturation 18%, started IV iron and a dose of Aranesp on 9/2.  #Secondary hyperparathyroidism: PTH 108 on 7/10/120.    Phosphorus level improved to 4.2 today.    #Hypertension: Blood pressure is suboptimal.  There was some concern of noncompliance.  Off ACE inhibitor.  Discontinued amlodipine because of worsening lower extreme edema.  Started hydralazine 3 times daily and continue metoprolol and diuretics as above. Monitor BP.  #Epigastric pain: This  is patient's main concern for last month or so.    Feels better today.   Subjective: Seen and examined at bedside.  Good urine output with IV Lasix but still having lower extremity edema.  No chest pain, shortness of breath, nausea vomiting.  No dysgeusia. Objective Vital signs in last 24 hours: Vitals:   03/10/19 1218 03/10/19 2025 03/11/19 0035 03/11/19 0702  BP: (!) 149/78 (!) 171/83 (!) 142/76 (!) 151/79  Pulse: 60 63 64 66  Resp: 18 18  18   Temp: 98.4 F (36.9 C) 98.3 F (36.8 C)  99.8 F (37.7 C)  TempSrc: Oral Oral  Oral  SpO2: 93% 94%  92%  Weight:    90.1 kg  Height:       Weight change:   Intake/Output Summary (Last 24 hours) at 03/11/2019 1031 Last data filed at 03/11/2019 0900 Gross per 24 hour  Intake 580 ml  Output 1800 ml  Net -1220 ml       Labs: Basic Metabolic Panel: Recent Labs  Lab 03/09/19 0433 03/10/19 0458 03/11/19 0536  NA 141 140 139  K 3.3* 3.9 4.1  CL 109 105 108  CO2 24 23 25   GLUCOSE 122* 182* 138*  BUN 39* 41* 42*  CREATININE 4.86* 5.32* 5.38*  CALCIUM 7.7* 7.5* 7.6*  PHOS 5.1* 5.0* 4.2   Liver Function Tests: Recent Labs  Lab 03/07/19 1325 03/08/19 0514 03/09/19 0433 03/10/19 0458 03/11/19 0536  AST 26 18  --   --   --   ALT 20 16  --   --   --  ALKPHOS 113 89  --   --   --   BILITOT 0.3 0.6  --   --   --   PROT 5.7* 4.8*  --   --   --   ALBUMIN 1.9* 1.6* 1.7* 1.7* 1.7*   No results for input(s): LIPASE, AMYLASE in the last 168 hours. No results for input(s): AMMONIA in the last 168 hours. CBC: Recent Labs  Lab 03/07/19 1158 03/08/19 0514  WBC 9.2 6.9  HGB 10.9* 9.3*  HCT 34.7* 28.7*  MCV 86.3 81.3  PLT 223 192   Cardiac Enzymes: No results for input(s): CKTOTAL, CKMB, CKMBINDEX, TROPONINI in the last 168 hours. CBG: Recent Labs  Lab 03/10/19 1117 03/10/19 1335 03/10/19 1619 03/10/19 2143 03/11/19 0625  GLUCAP 181* 200* 177* 170* 133*    Iron Studies:  Recent Labs    03/09/19 0433  IRON 41*   TIBC 225*  FERRITIN 139   Studies/Results: No results found.  Medications: Infusions:   Scheduled Medications: . fenofibrate  54 mg Oral Daily  . furosemide  80 mg Intravenous Q12H  . gabapentin  300 mg Oral TID  . heparin injection (subcutaneous)  5,000 Units Subcutaneous Q8H  . hydrALAZINE  50 mg Oral Q8H  . insulin aspart  0-15 Units Subcutaneous TID WC  . insulin glargine  10 Units Subcutaneous QHS  . metolazone  5 mg Oral Once  . metoprolol tartrate  25 mg Oral BID  . pantoprazole  40 mg Oral Daily  . potassium chloride  40 mEq Oral BID  . rosuvastatin  40 mg Oral q1800    have reviewed scheduled and prn medications.  Physical Exam: General:NAD, comfortable, able to lie flat Heart:RRR, s1s2 nl, no rubs Lungs: Clear b/l, no crackle Abdomen:soft, Non-tender, non-distended Extremities: Bilateral lower extremity pitting edema++, up to knees Neurology: Alert awake and following commands  Mattilynn Forrer Tanna Furry 03/11/2019,10:31 AM  LOS: 4 days  Pager: ID:[phone]

## 2019-03-12 ENCOUNTER — Inpatient Hospital Stay (HOSPITAL_COMMUNITY): Payer: Self-pay

## 2019-03-12 DIAGNOSIS — N185 Chronic kidney disease, stage 5: Secondary | ICD-10-CM

## 2019-03-12 DIAGNOSIS — I120 Hypertensive chronic kidney disease with stage 5 chronic kidney disease or end stage renal disease: Secondary | ICD-10-CM

## 2019-03-12 LAB — RENAL FUNCTION PANEL
Albumin: 1.8 g/dL — ABNORMAL LOW (ref 3.5–5.0)
Anion gap: 9 (ref 5–15)
BUN: 44 mg/dL — ABNORMAL HIGH (ref 6–20)
CO2: 22 mmol/L (ref 22–32)
Calcium: 7.6 mg/dL — ABNORMAL LOW (ref 8.9–10.3)
Chloride: 108 mmol/L (ref 98–111)
Creatinine, Ser: 5.68 mg/dL — ABNORMAL HIGH (ref 0.61–1.24)
GFR calc Af Amer: 12 mL/min — ABNORMAL LOW (ref >60)
GFR calc non Af Amer: 11 mL/min — ABNORMAL LOW (ref >60)
Glucose, Bld: 180 mg/dL — ABNORMAL HIGH (ref 70–99)
Phosphorus: 4.1 mg/dL (ref 2.5–4.6)
Potassium: 4.1 mmol/L (ref 3.5–5.1)
Sodium: 139 mmol/L (ref 135–145)

## 2019-03-12 LAB — GLUCOSE, CAPILLARY
Glucose-Capillary: 182 mg/dL — ABNORMAL HIGH (ref 70–99)
Glucose-Capillary: 107 mg/dL — ABNORMAL HIGH (ref 70–99)
Glucose-Capillary: 223 mg/dL — ABNORMAL HIGH (ref 70–99)
Glucose-Capillary: 194 mg/dL — ABNORMAL HIGH (ref 70–99)

## 2019-03-12 MED ORDER — METOLAZONE 5 MG PO TABS
5.0000 mg | ORAL_TABLET | Freq: Once | ORAL | Status: AC
  Administered 2019-03-12: 09:00:00 5 mg via ORAL
  Filled 2019-03-12: qty 1

## 2019-03-12 MED ORDER — FUROSEMIDE 10 MG/ML IJ SOLN
120.0000 mg | Freq: Three times a day (TID) | INTRAVENOUS | Status: DC
  Administered 2019-03-12 – 2019-03-15 (×10): 120 mg via INTRAVENOUS
  Filled 2019-03-12: qty 10
  Filled 2019-03-12: qty 12
  Filled 2019-03-12: qty 10
  Filled 2019-03-12: qty 2
  Filled 2019-03-12: qty 12
  Filled 2019-03-12 (×3): qty 10
  Filled 2019-03-12: qty 12
  Filled 2019-03-12: qty 2
  Filled 2019-03-12 (×2): qty 10
  Filled 2019-03-12: qty 12

## 2019-03-12 MED ORDER — INSULIN GLARGINE 100 UNIT/ML ~~LOC~~ SOLN
12.0000 [IU] | Freq: Every day | SUBCUTANEOUS | Status: DC
  Administered 2019-03-12 – 2019-03-14 (×3): 12 [IU] via SUBCUTANEOUS
  Filled 2019-03-12 (×5): qty 0.12

## 2019-03-12 NOTE — Plan of Care (Signed)

## 2019-03-12 NOTE — Progress Notes (Addendum)
   Subjective: The patient was sitting in his bed today upon entering the room.  He said that he had spoken with the kidney doctor earlier today and that there was a discussion regarding dialysis.  At this time he does not feel that he is a candidate for dialysis and is not sure why it would be indicated.  He explained in detail his understanding of the indications for dialysis and that he did not think he had any of those.  Although he understands that his kidneys are not functioning, given his moderate urine output he would like to hold off on progressing to dialysis at this time.  Our discussion digressed into the various indications for dialysis and that given his lack of improvement in kidney function with near maximal doses of diuretics we felt that at some point likely will require hemodialysis.  Patient stated that he understood and would like to think.  He was in agreement with continuing with diuresis as per nephrology.  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  There is some concern of congestive heart failure but repeat echo does not reveal this is a significant  factor.  Is has a prior renal biopsy confirmed diabetic nephropathy.  Patient has been diuresed aggressively since admission without marked improvement in his weight although he is down a net -5 L per chart review.   Acute on chronic CKD stage V: Patient team has been provided bilateral pitting edema on evaluation per charted weights remains relatively unchanged although he is down to 5 L since admission.  Feel the weights more reliable given his lack of changes on exam.  Patient's creatinine is 5.68 up from 5.38 but with relatively unchanged GFR.  I agree the patient does not currently have any acute indication of hemodialysis but will likely require this in the near future. -I agree 120 mg IV torsemide every 8 hours -Agree with metolazone 5 mg daily -Continue daily renal monitoring and vitals trends -Continue strict I's and O's  Hypertension: Continue hydralazine 50 mg every 8 hours.  I would not restart the ACE inhibitor given his severe renal dysfunction.  Could consider amlodipine.   Diabetes mellitus type 2: Currently well controlled with averages almost within range at +/- 20 of 140. -Continue Lantus but increase to 12 units nightly - Continue SSI aspart insulin  Code: Full DVT PPX: Heparin Diet: Renal carb modified Dispo: Anticipated discharge in approximately 2-3 day(s).   Kathi Ludwig, MD 03/12/2019, 6:51 AM Pager: # [phone]

## 2019-03-12 NOTE — Progress Notes (Signed)
Willie Jordan  Assessment/ Plan: Pt is a 53 y.o. 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 Johnney Jordan at [facility], admitted with fluid overload and AKI on CKD.  #Acute kidney injury on CKD stage IV with fluid overload likely due to CHF exacerbation.  Patient has biopsy-proven diabetic nephropathy causing CKD.  The recent baseline creatinine level is around 3.1 in 01/2019.  Now, the creatinine level elevated to 4.76 on admission associated with significant fluid overload. Continue to hold off ACEI. -Urinalysis with known proteinuria but no RBC or WBC. -Recent CT scan done on 8/16 with no hydronephrosis or nephrolithiasis. -He is receiving IV diuretics decent urine output however the weight has not changed.  Since admission he is negative by 5.2 L.  He still has significant lower extremity edema associated with orthopnea.  I will get chest x-ray today.  Increase the dose of Lasix to 120 mg every 8 hourly and add a dose of metolazone.  I have discussed with him that if he continue to have worsening kidney function or developed any uremic symptoms then he will need dialysis.  He agreed with the plan.  He is not uremic and no indication for dialysis today.  # CHF exacerbation/LE edema: Echo  EF of 60 to 123456 normal systolic function.  Given lower extreme edema I discontinued amlodipine.  Diuretics as above.  #Hypokalemia due to diuretics.   Continue KCl. Continue to monitor.  #Anemia due to CKD: Hemoglobin 9.3.    Iron saturation 18%, started IV iron and a dose of Aranesp on 9/2.  #Secondary hyperparathyroidism: PTH 108 on 7/10/120.    Phosphorus level improved.  #Hypertension: Monitor blood pressure.  Continue current medication.  #Epigastric pain: This is patient's main concern for last month or so.     Feels better today.   Subjective: Seen and examined at bedside.  Still having lower extremity edema and reports difficulty lying down because of shortness of breath.  No nausea, vomiting, dysgeusia, chest pain.  Urine output around 1.5 L.  Objective Vital signs in last 24 hours: Vitals:   03/11/19 2232 03/12/19 0510 03/12/19 0541 03/12/19 0613  BP: (!) 151/84 (!) 144/68    Pulse: 66 70    Resp:  20    Temp:  98.2 F (36.8 C)    TempSrc:  Oral    SpO2:    97%  Weight:   90.6 kg   Height:       Weight change:   Intake/Output Summary (Last 24 hours) at 03/12/2019 0934 Last data filed at 03/12/2019 0800 Gross per 24 hour  Intake 240 ml  Output 1500 ml  Net -1260 ml       Labs: Basic Metabolic Panel: Recent Labs  Lab 03/10/19 0458 03/11/19 0536 03/12/19 0607  NA 140 139 139  K 3.9 4.1 4.1  CL 105 108 108  CO2 23 25 22   GLUCOSE 182* 138* 180*  BUN 41* 42* 44*  CREATININE 5.32* 5.38* 5.68*  CALCIUM 7.5* 7.6* 7.6*  PHOS 5.0* 4.2 4.1   Liver Function Tests: Recent Labs  Lab 03/07/19 1325 03/08/19 0514  03/10/19 0458 03/11/19 0536 03/12/19 0607  AST 26 18  --   --   --   --   ALT 20 16  --   --   --   --   Willie Jordan  113 89  --   --   --   --   BILITOT 0.3 0.6  --   --   --   --   PROT 5.7* 4.8*  --   --   --   --   ALBUMIN 1.9* 1.6*   < > 1.7* 1.7* 1.8*   < > = values in this interval not displayed.   No results for input(s): LIPASE, AMYLASE in the last 168 hours. No results for input(s): AMMONIA in the last 168 hours. CBC: Recent Labs  Lab 03/07/19 1158 03/08/19 0514  WBC 9.2 6.9  HGB 10.9* 9.3*  HCT 34.7* 28.7*  MCV 86.3 81.3  PLT 223 192   Cardiac Enzymes: No results for input(s): CKTOTAL, CKMB, CKMBINDEX, TROPONINI in the last 168 hours. CBG: Recent Labs  Lab 03/11/19 0625 03/11/19 1130 03/11/19 1644 03/11/19 2121 03/12/19 0639  GLUCAP 133* 193* 150* 170* 182*    Iron Studies:  No results for input(s): IRON, TIBC, TRANSFERRIN, FERRITIN  in the last 72 hours. Studies/Results: No results found.  Medications: Infusions: . furosemide      Scheduled Medications: . fenofibrate  54 mg Oral Daily  . gabapentin  300 mg Oral TID  . heparin injection (subcutaneous)  5,000 Units Subcutaneous Q8H  . hydrALAZINE  50 mg Oral Q8H  . insulin aspart  0-15 Units Subcutaneous TID WC  . insulin glargine  10 Units Subcutaneous QHS  . metoprolol tartrate  25 mg Oral BID  . pantoprazole  40 mg Oral Daily  . potassium chloride  40 mEq Oral BID  . rosuvastatin  40 mg Oral q1800    have reviewed scheduled and prn medications.  Physical Exam: General:NAD, comfortable, not in distress Heart:RRR, s1s2 nl, no rubs Lungs: Clear b/l, no crackle Abdomen:soft, Non-tender, non-distended Extremities: Bilateral lower extremity pitting edema++, no significant improvement Neurology: Alert awake and following commands, no asterixis Skin: No rash or ulcer  Willie Jordan 03/12/2019,9:34 AM  LOS: 5 days  Pager: [phone]

## 2019-03-13 LAB — GLUCOSE, CAPILLARY
Glucose-Capillary: 121 mg/dL — ABNORMAL HIGH (ref 70–99)
Glucose-Capillary: 146 mg/dL — ABNORMAL HIGH (ref 70–99)
Glucose-Capillary: 165 mg/dL — ABNORMAL HIGH (ref 70–99)
Glucose-Capillary: 170 mg/dL — ABNORMAL HIGH (ref 70–99)

## 2019-03-13 LAB — RENAL FUNCTION PANEL
Albumin: 1.8 g/dL — ABNORMAL LOW (ref 3.5–5.0)
Anion gap: 8 (ref 5–15)
BUN: 45 mg/dL — ABNORMAL HIGH (ref 6–20)
CO2: 25 mmol/L (ref 22–32)
Calcium: 8.1 mg/dL — ABNORMAL LOW (ref 8.9–10.3)
Chloride: 109 mmol/L (ref 98–111)
Creatinine, Ser: 5.72 mg/dL — ABNORMAL HIGH (ref 0.61–1.24)
GFR calc Af Amer: 12 mL/min — ABNORMAL LOW (ref >60)
GFR calc non Af Amer: 11 mL/min — ABNORMAL LOW (ref >60)
Glucose, Bld: 122 mg/dL — ABNORMAL HIGH (ref 70–99)
Phosphorus: 4.7 mg/dL — ABNORMAL HIGH (ref 2.5–4.6)
Potassium: 4.1 mmol/L (ref 3.5–5.1)
Sodium: 142 mmol/L (ref 135–145)

## 2019-03-13 MED ORDER — METOLAZONE 5 MG PO TABS
5.0000 mg | ORAL_TABLET | Freq: Once | ORAL | Status: AC
  Administered 2019-03-13: 5 mg via ORAL
  Filled 2019-03-13: qty 1

## 2019-03-13 NOTE — Progress Notes (Signed)
  Subjective:  Patient says he is doing okay today.  He denies nausea, vomiting.  He does report ongoing diarrhea which she had prior to admission.  He reports shortness of breath which is worse when he is lying flat, denies chest pain.  He thinks that he is peeing more than he was a couple days ago.   Objective:    Vital Signs (last 24 hours): Vitals:   03/12/19 1156 03/12/19 2055 03/13/19 0616 03/13/19 1115  BP: (!) 150/73 (!) 154/76 (!) 159/77 (!) 165/80  Pulse: 64 77 67 63  Resp: 18 16 15    Temp: 98 F (36.7 C) 98.1 F (36.7 C) 98.5 F (36.9 C) 98.2 F (36.8 C)  TempSrc: Oral Oral Oral Oral  SpO2: 90% 91% (!) 88% 98%  Weight:   89.5 kg   Height:         Physical Exam: General Alert and answers questions appropriately, no acute distress  Cardiac Regular rate and rhythm, 2/6 systolic murmur  Pulmonary Clear to auscultation bilaterally without wheezes, rhonchi, or rales  Abdominal Soft, non-tender, without distention. Bowel sounds present  Extremities 2+ pitting edema bilaterally up to thighs, not significantly changed from previous day      Assessment/Plan:   Principal Problem:   Nephrotic syndrome due to secondary diabetes ([place]) Active Problems:   Type II diabetes mellitus with renal manifestations ([place])   Acute renal failure superimposed on stage 4 chronic kidney disease ([place])  Patient is a 53 year old male who presented with volume overload secondary nephrotic syndrome.  # Acute on chronic CKD stage V: Patient with biopsy-proven diabetic nephropathy causing CKD.  Patient without significant response to aggressive diuretic therapy.  Creatinine continues to trend up and is now 5.72.  Patient not currently with acute indication for hemodialysis but is likely to require this soon. * Continue IV Lasix 120 mg 3 times daily + metolazone 5 mg daily. * Strict I/Os * Daily renal function panels  # HTN: Continue hydralazine 50 mg every 8 hours.  Blood pressure stable over  past 24 hours with systolics in the [id number].  Will consider amlodipine if additional agent indicated.  # Diabetes mellitus: Well controlled on Lantus 12 units nightly (increased yesterday from 10 Units) plus sliding scale insulin.   Dispo: Anticipate discharge pending clinical improvement.Jeanmarie Hubert, MD 03/13/2019, 1:08 PM Pager: [phone]

## 2019-03-13 NOTE — Progress Notes (Signed)
[facility] NEPHROLOGY PROGRESS NOTE  Assessment/ Plan: Pt is a 53 y.o. 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 Johnney Ou at [facility], admitted with fluid overload and AKI on CKD.  #Acute kidney injury on CKD stage IV with fluid overload likely due to CHF exacerbation.  Patient has biopsy-proven diabetic nephropathy causing CKD.  The recent baseline creatinine level is around 3.1 in 01/2019.  Now, the creatinine level elevated to 4.76 on admission associated with significant fluid overload. Continue to hold off ACEI. -Urinalysis with known proteinuria but no RBC or WBC. -Recent CT scan done on 8/16 with no hydronephrosis or nephrolithiasis. -He is receiving high-dose of IV diuretics and metolazone with not appropriate response.  Urine output only 1.7 L in 24 hours, negative by 5.7 L since admission.  Weight has not changed much.  Still with lower extremity edema and orthopnea.  Plan to continue Lasix to 120 mg every 8 hourly and  metolazone.  I have discussed with the patient that if he continue to have worsening kidney function or developed any uremic symptoms then he will need dialysis.  He agreed with the plan.  He is not uremic and no indication for dialysis today.  Chest x-ray with a small pleural effusion and possible atelectasis.  # CHF exacerbation/LE edema: Echo  EF of 60 to 123456 normal systolic function.  Given lower extreme edema I discontinued amlodipine.  Diuretics as above.  #Hypokalemia due to diuretics.   Continue KCl. Continue to monitor.  #Anemia due to CKD: Hemoglobin 9.3.    Iron saturation 18%, started IV iron and a dose of Aranesp on 9/2.  #Secondary hyperparathyroidism: PTH 108 on 7/10/120.    Phosphorus level improved.  #Hypertension: Monitor blood pressure.  Continue current medication.  #Epigastric  pain: This is patient's main concern for last month or so.  Feels better today.   Subjective: Seen and examined at bedside.  Urine output only 1.7 L.  Still having lower extremity edema and feeling short of breath while lying.  No nausea, vomiting, dysgeusia, chest pain.   Objective Vital signs in last 24 hours: Vitals:   03/12/19 0613 03/12/19 1156 03/12/19 2055 03/13/19 0616  BP:  (!) 150/73 (!) 154/76 (!) 159/77  Pulse:  64 77 67  Resp:  18 16 15   Temp:  98 F (36.7 C) 98.1 F (36.7 C) 98.5 F (36.9 C)  TempSrc:  Oral Oral Oral  SpO2: 97% 90% 91% (!) 88%  Weight:    89.5 kg  Height:       Weight change: -0.605 kg  Intake/Output Summary (Last 24 hours) at 03/13/2019 0911 Last data filed at 03/13/2019 0751 Gross per 24 hour  Intake 1022 ml  Output 1750 ml  Net -728 ml       Labs: Basic Metabolic Panel: Recent Labs  Lab 03/11/19 0536 03/12/19 0607 03/13/19 0735  NA 139 139 142  K 4.1 4.1 4.1  CL 108 108 109  CO2 25 22 25   GLUCOSE 138* 180* 122*  BUN 42* 44* 45*  CREATININE 5.38* 5.68* 5.72*  CALCIUM 7.6* 7.6* 8.1*  PHOS 4.2 4.1 4.7*   Liver Function Tests: Recent Labs  Lab 03/07/19 1325 03/08/19 0514  03/11/19 0536 03/12/19 0607 03/13/19 0735  AST 26 18  --   --   --   --   ALT 20 16  --   --   --   --  ALKPHOS 113 89  --   --   --   --   BILITOT 0.3 0.6  --   --   --   --   PROT 5.7* 4.8*  --   --   --   --   ALBUMIN 1.9* 1.6*   < > 1.7* 1.8* 1.8*   < > = values in this interval not displayed.   No results for input(s): LIPASE, AMYLASE in the last 168 hours. No results for input(s): AMMONIA in the last 168 hours. CBC: Recent Labs  Lab 03/07/19 1158 03/08/19 0514  WBC 9.2 6.9  HGB 10.9* 9.3*  HCT 34.7* 28.7*  MCV 86.3 81.3  PLT 223 192   Cardiac Enzymes: No results for input(s): CKTOTAL, CKMB, CKMBINDEX, TROPONINI in the last 168 hours. CBG: Recent Labs  Lab 03/12/19 0639 03/12/19 1120 03/12/19 1650 03/12/19 2115 03/13/19 0729   GLUCAP 182* 107* 223* 194* 121*    Iron Studies:  No results for input(s): IRON, TIBC, TRANSFERRIN, FERRITIN in the last 72 hours. Studies/Results: Dg Chest Port 1 View  Result Date: 03/12/2019 CLINICAL DATA:  Acute kidney injury. Bilateral lower extremity edema and shortness of breath. EXAM: PORTABLE CHEST 1 VIEW COMPARISON:  PA and lateral chest 03/07/2019. Single-view of the chest 09/07/2018. FINDINGS: Bibasilar airspace disease is worse on the right. There is cardiomegaly. Likely small right effusion noted. No pneumothorax. Aortic atherosclerosis is seen. IMPRESSION: Right greater than left basilar airspace disease has an appearance most suggestive of pneumonia or atelectasis rather than pulmonary edema. Small right pleural effusion. Atherosclerosis. Cardiomegaly. Electronically Signed   By: Inge Rise M.D.   On: 03/12/2019 12:10    Medications: Infusions: . furosemide 120 mg (03/13/19 0751)    Scheduled Medications: . fenofibrate  54 mg Oral Daily  . gabapentin  300 mg Oral TID  . heparin injection (subcutaneous)  5,000 Units Subcutaneous Q8H  . hydrALAZINE  50 mg Oral Q8H  . insulin aspart  0-15 Units Subcutaneous TID WC  . insulin glargine  12 Units Subcutaneous QHS  . metolazone  5 mg Oral Once  . metoprolol tartrate  25 mg Oral BID  . pantoprazole  40 mg Oral Daily  . potassium chloride  40 mEq Oral BID  . rosuvastatin  40 mg Oral q1800    have reviewed scheduled and prn medications.  Physical Exam: General:NAD, comfortable, not in distress Heart:RRR, s1s2 nl, no rubs Lungs: Clear b/l, no crackle Abdomen:soft, Non-tender, non-distended Extremities: Bilateral lower extremity pitting edema++, about the same as yesterday. Neurology: Alert awake and following commands, no asterixis Skin: No rash or ulcer  Dron Tanna Furry 03/13/2019,9:11 AM  LOS: 6 days  Pager: [phone]

## 2019-03-13 NOTE — Plan of Care (Signed)
  Problem: Activity: Goal: Risk for activity intolerance will decrease Outcome: Progressing   

## 2019-03-14 DIAGNOSIS — J90 Pleural effusion, not elsewhere classified: Secondary | ICD-10-CM

## 2019-03-14 LAB — BODY FLUID CELL COUNT WITH DIFFERENTIAL
Eos, Fluid: 1 %
Lymphs, Fluid: 24 %
Monocyte-Macrophage-Serous Fluid: 57 % (ref 50–90)
Neutrophil Count, Fluid: 18 % (ref 0–25)
Total Nucleated Cell Count, Fluid: 183 cu mm (ref 0–1000)

## 2019-03-14 LAB — RENAL FUNCTION PANEL
Albumin: 1.8 g/dL — ABNORMAL LOW (ref 3.5–5.0)
Anion gap: 8 (ref 5–15)
BUN: 47 mg/dL — ABNORMAL HIGH (ref 6–20)
CO2: 24 mmol/L (ref 22–32)
Calcium: 7.8 mg/dL — ABNORMAL LOW (ref 8.9–10.3)
Chloride: 106 mmol/L (ref 98–111)
Creatinine, Ser: 6.35 mg/dL — ABNORMAL HIGH (ref 0.61–1.24)
GFR calc Af Amer: 11 mL/min — ABNORMAL LOW (ref >60)
GFR calc non Af Amer: 9 mL/min — ABNORMAL LOW (ref >60)
Glucose, Bld: 208 mg/dL — ABNORMAL HIGH (ref 70–99)
Phosphorus: 4.3 mg/dL (ref 2.5–4.6)
Potassium: 4.4 mmol/L (ref 3.5–5.1)
Sodium: 138 mmol/L (ref 135–145)

## 2019-03-14 LAB — LACTATE DEHYDROGENASE, PLEURAL OR PERITONEAL FLUID: LD, Fluid: 32 U/L — ABNORMAL HIGH (ref 3–23)

## 2019-03-14 LAB — GLUCOSE, CAPILLARY
Glucose-Capillary: 158 mg/dL — ABNORMAL HIGH (ref 70–99)
Glucose-Capillary: 164 mg/dL — ABNORMAL HIGH (ref 70–99)
Glucose-Capillary: 206 mg/dL — ABNORMAL HIGH (ref 70–99)
Glucose-Capillary: 108 mg/dL — ABNORMAL HIGH (ref 70–99)

## 2019-03-14 LAB — LACTATE DEHYDROGENASE: LDH: 109 U/L (ref 98–192)

## 2019-03-14 LAB — PROTEIN, PLEURAL OR PERITONEAL FLUID: Total protein, fluid: <3 g/dL

## 2019-03-14 LAB — PROTEIN, TOTAL: Total Protein: 5.4 g/dL — ABNORMAL LOW (ref 6.5–8.1)

## 2019-03-14 MED ORDER — GABAPENTIN 100 MG PO CAPS
200.0000 mg | ORAL_CAPSULE | Freq: Three times a day (TID) | ORAL | Status: DC
  Administered 2019-03-14 – 2019-03-16 (×5): 200 mg via ORAL
  Filled 2019-03-14 (×6): qty 2

## 2019-03-14 NOTE — Procedures (Signed)
Thoracentesis Procedure Note  Pre-operative Diagnosis:  Pleural effusion  Indications:  Symptomatic pleural effusion  Procedure Details:  Informed consent was obtained after explanation of the risks and benefits of the procedure, refer to the consent documentation.  Time-out Performed immediately prior to the procedure.  All available chest radiographs were reviewed and the patient was subsequently placed in a sitting/semi-recumbent position.  Using ultrasound guidance a moderate-sized pleural effusion was noted on the right.  The area was prepped with chlorhexadine and draped in sterile fashion.  Following this 1% lidocaine was injected subcutaneously and deep to provide anesthesia.  A small incision was then made parallel and superior to the rib, the thoracentesis needle with catheter was inserted into the chest wall and advanced under constant aspiration.  Upon aspiration of pleural fluid, the catheter was advanced into the pleural space and the needle was removed.  The catheter was then connected to a drainange bag and the fluid was removed under manual drainage. The catheter was then removed during slow forced exhalation and a sterile bandage was placed.  Findings:   900 mL of light amber pleural fluid was removed.  Fluid was sent for cell count, LDH, protein analysis  Condition: The patient tolerated the procedure well and remains in the same condition as pre-procedure.  Complications: None  Plan: Post-procedure ultrasound showed appropriate lung sliding, which makes a pneumothorax unlikely. A routine post-procedure xray is not necessary unless the patient develops new symptoms.   Jeanmarie Hubert, MD 03/14/2019, 3:17 PM Pager: [phone]

## 2019-03-14 NOTE — Progress Notes (Signed)
Patient had a thoracentesis performed at the bedside. The incision is located at the right mid section of the chest wall. 900 ml of pleural space was removed and the patient tolerated the procedure well.

## 2019-03-14 NOTE — Plan of Care (Signed)

## 2019-03-14 NOTE — Progress Notes (Signed)
  Subjective:  Patient seen at bedside this morning.  Patient says her breathing is about the same as it has been.  Patient reports sleeping with bed elevated and he is more comfortable this way.  We explained the benefits of performing a thoracentesis and patient is in favor proceeding with procedure.  All questions answered.   Objective:    Vital Signs (last 24 hours): Vitals:   03/14/19 0359 03/14/19 0401 03/14/19 0741 03/14/19 1034  BP:  (!) 159/76  (!) 169/87  Pulse:  65  64  Resp:  20  18  Temp:  98.6 F (37 C)  98.2 F (36.8 C)  TempSrc:  Oral  Oral  SpO2:  90%  91%  Weight: 42.2 kg  87.3 kg   Height:        Physical Exam: General Alert and answers questions appropriately, no acute distress  Cardiac Regular rate and rhythm, 2/6 holosystolic murmur  Pulmonary Clear to auscultation bilaterally without wheezes, rhonchi, or rales  Extremities 2+ pitting edema bilaterally up to thighs      Assessment/Plan:   Principal Problem:   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  Weight of 87.3 kg down from 89.5 kg day before.  Concern with patient's worsening kidney disease he may soon require hemodialysis. * Continue Lasix 120 mg every 8 hours and metolazone 5 mg daily * Creatinine of 6.35, up from 5.72 * Will evaluate this afternoon for bedside thoracentesis * Continue strict I/O's and daily weights * Daily renal function panels  # HTN: Continue hydralazine 50 mg every 8 hours.  # Diabetes: Lantus 12 units nightly plus sliding scale insulin  Dispo: Anticipated  discharge pending clinical improvement.   Jeanmarie Hubert, MD 03/14/2019, 10:46 AM Pager: [phone]

## 2019-03-14 NOTE — Progress Notes (Signed)
Admit: 03/07/2019 LOS: 7  3M progressive CKD from diabetic nephropathy, volume overload/CHF exacerbation  Subjective:  . 4.2 L UOP yesterday, 2.7 L negative . Serum creatinine increased to 6.35 from 5.72, K 4.4 . On room air . Received metolazone as well as furosemide yesterday . Feels well . For thoracentesis today  09/06 0701 - 09/07 0700 In: 1440 [P.O.:1440] Out: [id number] [Urine:4225]  Filed Weights   03/13/19 0616 03/14/19 0359 03/14/19 0741  Weight: 89.5 kg 42.2 kg 87.3 kg    Scheduled Meds: . fenofibrate  54 mg Oral Daily  . gabapentin  300 mg Oral TID  . heparin injection (subcutaneous)  5,000 Units Subcutaneous Q8H  . hydrALAZINE  50 mg Oral Q8H  . insulin aspart  0-15 Units Subcutaneous TID WC  . insulin glargine  12 Units Subcutaneous QHS  . metoprolol tartrate  25 mg Oral BID  . pantoprazole  40 mg Oral Daily  . potassium chloride  40 mEq Oral BID  . rosuvastatin  40 mg Oral q1800   Continuous Infusions: . furosemide 120 mg (03/14/19 0650)   PRN Meds:.loperamide, nitroGLYCERIN  Current Labs: reviewed    Physical Exam:  Blood pressure (!) 169/87, pulse 64, temperature 98.2 F (36.8 C), temperature source Oral, resp. rate 18, height 5\' 4"  (1.626 m), weight 87.3 kg, SpO2 91 %. Well appearing, sitting up, NAD, nl wob Diminished in bases Regular, nl s1s2 1+ LEE Nonfocal NO rashes/lesions  A 1. AoCKD4, worsened with effective diuresis in past 24h; biopsy proven diabetic nephropathy, BL SCr 3.1 2. CHF Exacerbation, dCHF, improving slowly; 8.6L Neg, 4kg neg from admit 3. Pleural effusion 4. HTN, still up, cont diuresisx 5. Anemia 6. DM2 7. Anemia  P . HOld metolazone today, cont furosemide . No uremia, no indication for RRT . Medication Issues; o Preferred narcotic agents for pain control are hydromorphone, fentanyl, and methadone. Morphine should not be used.  o Baclofen should be avoided o Avoid oral sodium phosphate and magnesium citrate based  laxatives / bowel preps    Pearson Grippe MD 03/14/2019, 11:29 AM  Recent Labs  Lab 03/12/19 0607 03/13/19 0735 03/14/19 0342  NA 139 142 138  K 4.1 4.1 4.4  CL 108 109 106  CO2 22 25 24   GLUCOSE 180* 122* 208*  BUN 44* 45* 47*  CREATININE 5.68* 5.72* 6.35*  CALCIUM 7.6* 8.1* 7.8*  PHOS 4.1 4.7* 4.3   Recent Labs  Lab 03/07/19 1158 03/08/19 0514  WBC 9.2 6.9  HGB 10.9* 9.3*  HCT 34.7* 28.7*  MCV 86.3 81.3  PLT 223 192

## 2019-03-15 DIAGNOSIS — I5033 Acute on chronic diastolic (congestive) heart failure: Secondary | ICD-10-CM

## 2019-03-15 LAB — RENAL FUNCTION PANEL
Albumin: 1.8 g/dL — ABNORMAL LOW (ref 3.5–5.0)
Anion gap: 7 (ref 5–15)
BUN: 49 mg/dL — ABNORMAL HIGH (ref 6–20)
CO2: 25 mmol/L (ref 22–32)
Calcium: 7.9 mg/dL — ABNORMAL LOW (ref 8.9–10.3)
Chloride: 106 mmol/L (ref 98–111)
Creatinine, Ser: 6.54 mg/dL — ABNORMAL HIGH (ref 0.61–1.24)
GFR calc Af Amer: 10 mL/min — ABNORMAL LOW (ref >60)
GFR calc non Af Amer: 9 mL/min — ABNORMAL LOW (ref >60)
Glucose, Bld: 134 mg/dL — ABNORMAL HIGH (ref 70–99)
Phosphorus: 4.7 mg/dL — ABNORMAL HIGH (ref 2.5–4.6)
Potassium: 4.1 mmol/L (ref 3.5–5.1)
Sodium: 138 mmol/L (ref 135–145)

## 2019-03-15 LAB — GLUCOSE, CAPILLARY
Glucose-Capillary: 128 mg/dL — ABNORMAL HIGH (ref 70–99)
Glucose-Capillary: 123 mg/dL — ABNORMAL HIGH (ref 70–99)
Glucose-Capillary: 141 mg/dL — ABNORMAL HIGH (ref 70–99)
Glucose-Capillary: 33 mg/dL — CL (ref 70–99)
Glucose-Capillary: 241 mg/dL — ABNORMAL HIGH (ref 70–99)

## 2019-03-15 LAB — PATHOLOGIST SMEAR REVIEW

## 2019-03-15 MED ORDER — HYDRALAZINE HCL 50 MG PO TABS
75.0000 mg | ORAL_TABLET | Freq: Three times a day (TID) | ORAL | Status: DC
  Administered 2019-03-15 – 2019-03-16 (×3): 75 mg via ORAL
  Filled 2019-03-15 (×3): qty 1

## 2019-03-15 MED ORDER — FUROSEMIDE 80 MG PO TABS
120.0000 mg | ORAL_TABLET | Freq: Three times a day (TID) | ORAL | Status: DC
  Administered 2019-03-15 – 2019-03-16 (×3): 120 mg via ORAL
  Filled 2019-03-15 (×4): qty 1

## 2019-03-15 NOTE — Progress Notes (Addendum)
  Subjective:  Patient seen in bedside chair this morning.  Patient says he feels a little unsteady on his feet.  Patient reports that he was able to sleep flat at night which is a significant improvement for him.   Objective:    Vital Signs (last 24 hours): Vitals:   03/14/19 1602 03/14/19 2157 03/15/19 0630 03/15/19 0841  BP: (!) 153/87 (!) 174/78 (!) 161/85 (!) 156/76  Pulse: (!) 58 66 (!) 59 66  Resp:  16 14   Temp:  98.5 F (36.9 C) 98.4 F (36.9 C) 98.2 F (36.8 C)  TempSrc:  Oral Oral Oral  SpO2: 91% 94% 92% 99%  Weight:   84.6 kg   Height:        Physical Exam: General Alert and answers questions appropriately, no acute distress  Cardiac Regular rate and rhythm, no murmurs, rubs, or gallops  Pulmonary Clear to auscultation bilaterally without wheezes, rhonchi, or rales  Skin Bandage in place over right mid-back from prior thoracentesis, no drainage.  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  Approximately 900 mL pleural fluid drained at bedside yesterday.  Creatinine has up trended during hospitalization, today 6.54 from 6.35. * Change to Lasix 120 mg PO every 8 hours, hold metolazone * Continue strict I/O's and daily weights * Daily renal function panels  # HTN: Patient with elevated systolics ranging from [id number] over past 24 hours.  Has been on hydralazine 50 mg 3 times daily, will increase to 75 mg.  Diabetes: Lantus 12 units nightly plus sliding scale insulin.   Diet: Renal/carb modified DVT Ppx: Heparin due to kidney disease Dispo: Anticipated discharge pending clinical improvement  Jeanmarie Hubert, MD 03/15/2019, 10:53 AM Pager: [phone]

## 2019-03-15 NOTE — Progress Notes (Signed)
Admit: 03/07/2019 LOS: 8  37M progressive CKD from diabetic nephropathy, volume overload/CHF exacerbation  Subjective:  . 3.7 L UOP yesterday  0.9L thoracentesis, 3.4L negative . Serum creatinine stable 6.54, K 4.1, HCO3 25 . On room air . Lasix 120 IV TID . No shortness of breath, PND . Discussed with Dr. Johnney Ou who has been concerned regarding his GERD symptoms and that it might be limiting his tolerance of medication . Patient agrees that on his normal diet has significant GERD . He tells me he has GI follow-up later this month  09/07 0701 - 09/08 0700 In: 1204 [P.O.:1080; IV Piggyback:124] Out: [id number] [Urine:3720]  Filed Weights   03/14/19 0359 03/14/19 0741 03/15/19 0630  Weight: 42.2 kg 87.3 kg 84.6 kg    Scheduled Meds: . fenofibrate  54 mg Oral Daily  . gabapentin  200 mg Oral TID  . heparin injection (subcutaneous)  5,000 Units Subcutaneous Q8H  . hydrALAZINE  50 mg Oral Q8H  . insulin aspart  0-15 Units Subcutaneous TID WC  . insulin glargine  12 Units Subcutaneous QHS  . metoprolol tartrate  25 mg Oral BID  . pantoprazole  40 mg Oral Daily  . potassium chloride  40 mEq Oral BID  . rosuvastatin  40 mg Oral q1800   Continuous Infusions: . furosemide 120 mg (03/15/19 0552)   PRN Meds:.loperamide, nitroGLYCERIN  Current Labs: reviewed    Physical Exam:  Blood pressure (!) 156/76, pulse 66, temperature 98.2 F (36.8 C), temperature source Oral, resp. rate 14, height 5\' 4"  (1.626 m), weight 84.6 kg, SpO2 99 %. Well appearing, sitting up, NAD, nl wob Diminished in bases Regular, nl s1s2 1+ LEE Nonfocal NO rashes/lesions  A 1. AoCKD4, stable right now but clearly progressive; biopsy proven diabetic nephropathy, 2. CHF Exacerbation, dCHF, improving slowly; 12L Neg, >6kg neg from admit 3. Pleural effusion status post 0.9 L thoracentesis 9/7 4. Hypertension, elevated, follow with diuresis 5. Anemia 6. DM2 7. Anemia  P . Transition to Lasix 120 p.o. 3 times  daily today, hold metolazone . If labs are stable and volume status stable/improved tomorrow, okay with follow-up as an outpatient . Make sure to continue PPI upon discharge . WIll make sure has close f/u at [place] . No uremia, no indication for RRT . Medication Issues; o Preferred narcotic agents for pain control are hydromorphone, fentanyl, and methadone. Morphine should not be used.  o Baclofen should be avoided o Avoid oral sodium phosphate and magnesium citrate based laxatives / bowel preps    Pearson Grippe MD 03/15/2019, 10:55 AM  Recent Labs  Lab 03/13/19 0735 03/14/19 0342 03/15/19 0530  NA 142 138 138  K 4.1 4.4 4.1  CL 109 106 106  CO2 25 24 25   GLUCOSE 122* 208* 134*  BUN 45* 47* 49*  CREATININE 5.72* 6.35* 6.54*  CALCIUM 8.1* 7.8* 7.9*  PHOS 4.7* 4.3 4.7*   No results for input(s): WBC, NEUTROABS, HGB, HCT, MCV, PLT in the last 168 hours.

## 2019-03-16 DIAGNOSIS — Z886 Allergy status to analgesic agent status: Secondary | ICD-10-CM

## 2019-03-16 LAB — RENAL FUNCTION PANEL
Albumin: 1.9 g/dL — ABNORMAL LOW (ref 3.5–5.0)
Anion gap: 8 (ref 5–15)
BUN: 52 mg/dL — ABNORMAL HIGH (ref 6–20)
CO2: 24 mmol/L (ref 22–32)
Calcium: 7.9 mg/dL — ABNORMAL LOW (ref 8.9–10.3)
Chloride: 104 mmol/L (ref 98–111)
Creatinine, Ser: 6.31 mg/dL — ABNORMAL HIGH (ref 0.61–1.24)
GFR calc Af Amer: 11 mL/min — ABNORMAL LOW (ref >60)
GFR calc non Af Amer: 9 mL/min — ABNORMAL LOW (ref >60)
Glucose, Bld: 153 mg/dL — ABNORMAL HIGH (ref 70–99)
Phosphorus: 5 mg/dL — ABNORMAL HIGH (ref 2.5–4.6)
Potassium: 4.4 mmol/L (ref 3.5–5.1)
Sodium: 136 mmol/L (ref 135–145)

## 2019-03-16 LAB — GLUCOSE, CAPILLARY
Glucose-Capillary: 115 mg/dL — ABNORMAL HIGH (ref 70–99)
Glucose-Capillary: 184 mg/dL — ABNORMAL HIGH (ref 70–99)

## 2019-03-16 MED ORDER — GABAPENTIN 100 MG PO CAPS: 200.0000 mg | ORAL_CAPSULE | Freq: Three times a day (TID) | ORAL | 1 refills | Status: AC

## 2019-03-16 MED ORDER — HYDRALAZINE HCL 25 MG PO TABS: 75.0000 mg | ORAL_TABLET | Freq: Three times a day (TID) | ORAL | 1 refills | Status: AC

## 2019-03-16 MED ORDER — POTASSIUM CHLORIDE ER 10 MEQ PO TBCR: 30.0000 meq | EXTENDED_RELEASE_TABLET | Freq: Two times a day (BID) | ORAL | 1 refills | Status: DC

## 2019-03-16 MED ORDER — FUROSEMIDE 40 MG PO TABS: 120.0000 mg | ORAL_TABLET | Freq: Three times a day (TID) | ORAL | 1 refills | Status: AC

## 2019-03-16 MED ORDER — PANTOPRAZOLE SODIUM 40 MG PO TBEC: 40.0000 mg | DELAYED_RELEASE_TABLET | Freq: Two times a day (BID) | ORAL | 1 refills | Status: DC

## 2019-03-16 MED ORDER — ROSUVASTATIN CALCIUM 40 MG PO TABS: 40.0000 mg | ORAL_TABLET | Freq: Every day | ORAL | 1 refills | Status: AC

## 2019-03-16 MED ORDER — PANTOPRAZOLE SODIUM 40 MG PO TBEC: 40.0000 mg | DELAYED_RELEASE_TABLET | Freq: Two times a day (BID) | ORAL | 1 refills | Status: AC

## 2019-03-16 MED ORDER — POTASSIUM CHLORIDE ER 10 MEQ PO TBCR: 30.0000 meq | EXTENDED_RELEASE_TABLET | Freq: Two times a day (BID) | ORAL | 1 refills | Status: AC

## 2019-03-16 MED ORDER — ROSUVASTATIN CALCIUM 40 MG PO TABS: 40.0000 mg | ORAL_TABLET | Freq: Every day | ORAL | 1 refills | Status: DC

## 2019-03-16 MED ORDER — FENOFIBRATE 54 MG PO TABS: 54.0000 mg | ORAL_TABLET | Freq: Every day | ORAL | 2 refills | Status: AC

## 2019-03-16 MED ORDER — GABAPENTIN 100 MG PO CAPS: 200.0000 mg | ORAL_CAPSULE | Freq: Three times a day (TID) | ORAL | 1 refills | Status: DC

## 2019-03-16 MED ORDER — FENOFIBRATE 54 MG PO TABS: 54.0000 mg | ORAL_TABLET | Freq: Every day | ORAL | 2 refills | Status: DC

## 2019-03-16 MED ORDER — FUROSEMIDE 40 MG PO TABS: 120.0000 mg | ORAL_TABLET | Freq: Three times a day (TID) | ORAL | 1 refills | Status: DC

## 2019-03-16 MED ORDER — HYDRALAZINE HCL 25 MG PO TABS: 75.0000 mg | ORAL_TABLET | Freq: Three times a day (TID) | ORAL | 1 refills | Status: DC

## 2019-03-16 MED FILL — FUROSEMIDE 40 MG TABLET: 40 | 30 days supply | Qty: 270 | Fill #0

## 2019-03-16 NOTE — Progress Notes (Signed)
Currently waiting for [facility] pharmacy to deliver medication.

## 2019-03-16 NOTE — Progress Notes (Signed)
   CBG: 33  Treatment: 2 orange juices  Symptoms: asymptomatic  Follow-up CBG: Time:2232 CBG Result:241 Possible Reasons for Event: unknown, pt had dinner  Will monitor

## 2019-03-16 NOTE — TOC Initial Note (Addendum)
Transition of Care [facility]) - Initial/Assessment Note    Patient Details  Name: Willie Jordan MRN: [id number] Date of Birth: September 18, 1965  Transition of [facility] CM/SW Contact:    Zenon Mayo, RN Phone Number: 03/16/2019, 2:02 PM  Clinical Narrative:                 Patient for dc today, per interpreter,  he has a orange card for his medications , he goes to the [facility] to get his medications, he has a orange card at home that he will use to get meds.  TOC can not fill his meds since he has orange card.  NCM contacted MD to send patient meds to [facility].    Expected Discharge Plan: Home/Self Care Barriers to Discharge: No Barriers Identified   Patient Goals and CMS Choice Patient states their goals for this hospitalization and ongoing recovery are:: go home   Choice offered to / list presented to : NA  Expected Discharge Plan and Services Expected Discharge Plan: Home/Self Care In-house Referral: PCP / [facility] Connect Discharge Planning Services: CM Consult Post Acute Care Choice: NA Living arrangements for the past 2 months: Single Family Home Expected Discharge Date: 03/16/19               DME Arranged: (NA)         HH Arranged: NA          Prior Living Arrangements/Services Living arrangements for the past 2 months: Single Family Home Lives with:: Self Patient language and need for interpreter reviewed:: Yes Do you feel safe going back to the place where you live?: Yes      Need for Family Participation in Patient Care: No (Comment) Care giver support system in place?: No (comment)   Criminal Activity/Legal Involvement Pertinent to Current Situation/Hospitalization: No - Comment as needed  Activities of Daily Living Home Assistive Devices/Equipment: None ADL Screening (condition at time of admission) Patient's cognitive ability adequate to safely complete daily activities?: Yes Is the patient deaf or have  difficulty hearing?: No Does the patient have difficulty seeing, even when wearing glasses/contacts?: No Does the patient have difficulty concentrating, remembering, or making decisions?: No Patient able to express need for assistance with ADLs?: Yes Does the patient have difficulty dressing or bathing?: No Independently performs ADLs?: Yes (appropriate for developmental age) Does the patient have difficulty walking or climbing stairs?: No Weakness of Legs: None Weakness of Arms/Hands: None  Permission Sought/Granted                  Emotional Assessment Appearance:: Appears stated age Attitude/Demeanor/Rapport: Engaged Affect (typically observed): Appropriate Orientation: : Oriented to Self, Oriented to Place, Oriented to  Time, Oriented to Situation Alcohol / Substance Use: Not Applicable Psych Involvement: No (comment)  Admission diagnosis:  Troponin level elevated [R79.89] Acute on chronic diastolic congestive heart failure ([place]) [I50.33] Patient Active Problem List   Diagnosis Date Noted  . Nephrotic syndrome due to secondary diabetes ([place]) 03/07/2019  . Anemia due to chronic kidney disease 09/07/2018  . Chronic diastolic CHF (congestive heart failure) ([place]) 04/10/2018  . Type II diabetes mellitus with renal manifestations ([place]) 04/09/2018  . Acute renal failure superimposed on stage 4 chronic kidney disease ([place]) 04/09/2018  . Retinopathy due to secondary diabetes mellitus, with macular edema, with proliferative retinopathy ([place]) 04/16/2017  . Peripheral neuropathy 03/17/2017  . Diabetic neuropathy ([place]) 03/17/2017  . Microalbuminuria due to type 2 diabetes mellitus ([place])  01/30/2017  . Diabetes mellitus ([place])   . HLD (hyperlipidemia) 09/30/2014  . Essential hypertension 09/30/2014  . Type 2 diabetes mellitus with hyperglycemia ([place]) 09/30/2014   PCP:  Vassie Moment, MD Pharmacy:   [facility]- [place], [place] - [address] [facility] [address] Phone: [phone] Fax: [phone]  [place], [place] [address] [place] [address] Phone: [phone] Fax: [phone]     Social Determinants of Health (SDOH) Interventions    Readmission Risk Interventions Readmission Risk Prevention Plan 03/16/2019  Transportation Screening Complete  PCP or Specialist Appt within 3-5 Days Complete  HRI or Cut Off Complete  Social Work Consult for [place] Planning/Counseling Complete  Palliative Care Screening Complete  Medication Review Press photographer) Complete  Some recent data might be hidden

## 2019-03-16 NOTE — Progress Notes (Signed)
Admit: 03/07/2019 LOS: 9  89M progressive CKD from diabetic nephropathy, volume overload/CHF exacerbation  Subjective:  . 1.7 L UOP yesterday with change to oral diuretics . No dyspnea, no PND/orthopnea . No significant edema . Using assistance from [place] speaking nurse, answered all of his questions . Creatinine is stable at 6.3, BUN 52, potassium 4.4 . No nausea/vomiting, anorexia.  Reviewed signs and symptoms of uremia States that his stomach issues have improved  09/08 0701 - 09/09 0700 In: 720 [P.O.:720] Out: 1650 [Urine:1650]  Filed Weights   03/14/19 0741 03/15/19 0630 03/16/19 0533  Weight: 87.3 kg 84.6 kg 83.1 kg    Scheduled Meds: . fenofibrate  54 mg Oral Daily  . furosemide  120 mg Oral TID  . gabapentin  200 mg Oral TID  . heparin injection (subcutaneous)  5,000 Units Subcutaneous Q8H  . hydrALAZINE  75 mg Oral Q8H  . insulin aspart  0-15 Units Subcutaneous TID WC  . insulin glargine  12 Units Subcutaneous QHS  . metoprolol tartrate  25 mg Oral BID  . pantoprazole  40 mg Oral Daily  . potassium chloride  40 mEq Oral BID  . rosuvastatin  40 mg Oral q1800   Continuous Infusions:  PRN Meds:.loperamide, nitroGLYCERIN  Current Labs: reviewed    Physical Exam:  Blood pressure (!) 165/82, pulse 67, temperature 98.5 F (36.9 C), temperature source Oral, resp. rate 20, height 5\' 4"  (1.626 m), weight 83.1 kg, SpO2 95 %. Well appearing, sitting up, NAD, nl wob Diminished in bases Regular, nl s1s2 trace+ LEE Nonfocal NO rashes/lesions  A 1. AoCKD4, stable right now but clearly progressive; biopsy proven diabetic nephropathy, 2. CHF Exacerbation, dCHF, improving slowly; 13L Neg 3. Pleural effusion status post 0.9 L thoracentesis 9/7 4. Hypertension, elevated, follow with diuresis 5. Anemia, Mild 6. DM2   P . Continue Lasix 120 p.o. 3 times daily today, hold metolazone . Okay with discharge . Would not resume ACE inhibitor at discharge . Has follow-up  with Dr. Johnney Ou on 03/23/2019, information in the discharge paperwork . WIll make sure has close f/u at [facility] MD 03/16/2019, 12:33 PM  Recent Labs  Lab 03/14/19 0342 03/15/19 0530 03/16/19 0402  NA 138 138 136  K 4.4 4.1 4.4  CL 106 106 104  CO2 24 25 24   GLUCOSE 208* 134* 153*  BUN 47* 49* 52*  CREATININE 6.35* 6.54* 6.31*  CALCIUM 7.8* 7.9* 7.9*  PHOS 4.3 4.7* 5.0*   No results for input(s): WBC, NEUTROABS, HGB, HCT, MCV, PLT in the last 168 hours.

## 2019-03-16 NOTE — Progress Notes (Signed)
Inpatient Diabetes Program Recommendations  AACE/ADA: New Consensus Statement on Inpatient Glycemic Control (2015)  Target Ranges:  Prepandial:   less than 140 mg/dL      Peak postprandial:   less than 180 mg/dL (1-2 hours)      Critically ill patients:  140 - 180 mg/dL   Lab Results  Component Value Date   GLUCAP 115 (H) 03/16/2019   HGBA1C 8.0 (H) 03/07/2019    Review of Glycemic Control Results for KINTA, ASTER (MRN [id number]) as of 03/16/2019 08:23  Ref. Range 03/15/2019 06:28 03/15/2019 11:38 03/15/2019 16:22 03/15/2019 21:40 03/15/2019 22:32 03/16/2019 06:39  Glucose-Capillary Latest Ref Range: 70 - 99 mg/dL 128 (H) 123 (H) 141 (H) 33 (LL) 241 (H) 115 (H)   Diabetes history: DM 2 Outpatient Diabetes medications: Glipizide 10 mg bid, 75-25 30 units qam, 24 units qpm Current orders for Inpatient glycemic control:  Lantus 12 units qhs Novolog Moderate 0-15 units tid  A1c 8% on 8/31, Hgb 10.9  Inpatient Diabetes Program Recommendations:    Hypoglycemia 33 due to insulin stacking with renal function. Fasting glucose looks good on Lantus 12 units.   Please reduce correction scale and use our Novolog Sensitive Correction 0-9 units.  Thanks,  Tama Headings RN, MSN, BC-ADM Inpatient Diabetes Coordinator Team Pager [phone] (8a-5p)

## 2019-03-16 NOTE — Progress Notes (Signed)
Medication delivered by [facility] pharmacy. Questions and  Concerns answered.  Peripheral iv removed, clean dry and intact, dressing applied. Pt's belongings with pt: cell phone, clothes, shoes, wallet and hat. Pt was accompanied by nurse tech to his transportation.

## 2019-03-16 NOTE — Progress Notes (Addendum)
  Subjective:  Patient seen at bedside this morning.  Patient says he feels well today.  Patient slept flat last night and did not have shortness of breath.  Patient says he takes glipizide and insulin for his diabetes.  He takes 75/25 mix Humalog and he thinks he takes 30 units in the morning and 26 units at night.   Objective:    Vital Signs (last 24 hours): Vitals:   03/16/19 0536 03/16/19 0948 03/16/19 0950 03/16/19 1254  BP: (!) 159/78 (!) 164/84 (!) 165/82 139/73  Pulse: 61 68 67 (!) 56  Resp: 20   20  Temp: 98.5 F (36.9 C)   98.1 F (36.7 C)  TempSrc: Oral   Oral  SpO2: 95%   96%  Weight:      Height:        Physical Exam: General Alert and answers questions appropriately, no acute distress  Cardiac Regular rate and rhythm, no murmurs, rubs, or gallops  Pulmonary Clear to auscultation bilaterally without wheezes, rhonchi, or rales  Extremities 2+ pitting edema bilaterally up to knees, decreased from day prior      Assessment/Plan:   Principal Problem:   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  # Volume overload in setting of diabetic FGS nephrotic syndrome: Patient weight of 83.1 kg today, from 84.6 kg yesterday.  Creatinine of 6.31 today, down from 6.54 yesterday.  Patient was transitioned to p.o. Lasix 120 mg every 8 hours.  Patient is significantly improved from admission with improvement in respiratory status, able to sleep lying flat. * Will discharge on Lasix 120 mg p.o. every 8 hours.  # HTN: Patient to be discharged on hydralazine 75 mg 3 times daily # DM: We will resume patient's home diabetic regimen  Dispo: Anticipate discharge today.  Jeanmarie Hubert, MD 03/16/2019, 3:27 PM  Pager: [phone]

## 2019-03-16 NOTE — Progress Notes (Signed)
Discharge  information and medication education given to patient. However pt has a question regarding his lasix and states that he will have difficulty getting his lasix at the pharmacy that it was sent.  Spoke with Benjamine Mola MD regarding pt's concerns. MD to call back.

## 2019-03-17 ENCOUNTER — Ambulatory Visit: Payer: Self-pay

## 2019-03-17 NOTE — Discharge Summary (Signed)
Name: Willie Jordan MRN: [id number] DOB: Mar 02, 1966 53 y.o. PCP: Vassie Moment, MD  Date of Admission: 03/07/2019 11:40 AM Date of Discharge: 03/16/2019 Attending Physician: Dr. Evette Doffing  Discharge Diagnosis: 1. 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 Hypertension 3. Diabetes Mellitus  Discharge Medications: Allergies as of 03/16/2019      Reactions   Aspirin Rash      Medication List    STOP taking these medications   glipiZIDE 10 MG tablet Commonly known as: GLUCOTROL   lisinopril 20 MG tablet Commonly known as: ZESTRIL   omeprazole 20 MG capsule Commonly known as: PRILOSEC   potassium chloride SA 20 MEQ tablet Commonly known as: K-DUR     TAKE these medications   acetaminophen 500 MG tablet Commonly known as: TYLENOL Take 2 tablets (1,000 mg total) by mouth every 8 (eight) hours as needed for mild pain or fever.   alum & mag hydroxide-simeth 200-200-20 MG/5ML suspension Commonly known as: MAALOX/MYLANTA Take 30 mLs by mouth every 6 (six) hours as needed for indigestion or heartburn.   blood glucose meter kit and supplies Dispense based on patient and insurance preference. Use up to four times daily as directed. (FOR ICD-9 250.00, 250.01).   fenofibrate 54 MG tablet Take 1 tablet (54 mg total) by mouth daily.   furosemide 40 MG tablet Commonly known as: LASIX Take 3 tablets (120 mg total) by mouth 3 (three) times daily. What changed:   how much to take  how to take this  when to take this  additional instructions   gabapentin 100 MG capsule Commonly known as: NEURONTIN Take 2 capsules (200 mg total) by mouth 3 (three) times daily. What changed:   medication strength  how much to take   hydrALAZINE 25 MG tablet Commonly known as: APRESOLINE Take 3 tablets (75 mg total) by mouth every 8 (eight) hours.   Insulin Lispro Prot & Lispro (75-25) 100 UNIT/ML Kwikpen Commonly known as: HumaLOG Mix 75/25 KwikPen Inject 26 units before breakfast and 24 units  before dinner daily What changed:   how much to take  how to take this  when to take this  additional instructions   Insulin Pen Needle 31G X 5 MM Misc Use as directed twice daily   methocarbamol 750 MG tablet Commonly known as: ROBAXIN Take 1 tablet (750 mg total) by mouth every 8 (eight) hours as needed for muscle spasms.   metoprolol tartrate 25 MG tablet Commonly known as: LOPRESSOR Take 1 tablet (25 mg total) by mouth 2 (two) times daily.   oxyCODONE 5 MG immediate release tablet Commonly known as: Oxy IR/ROXICODONE Take 1-2 tablets (5-10 mg total) by mouth every 6 (six) hours as needed for severe pain.   pantoprazole 40 MG tablet Commonly known as: PROTONIX Take 1 tablet (40 mg total) by mouth 2 (two) times daily.   potassium chloride 10 MEQ tablet Commonly known as: K-DUR Take 3 tablets (30 mEq total) by mouth 2 (two) times daily.   rosuvastatin 40 MG tablet Commonly known as: CRESTOR Take 1 tablet (40 mg total) by mouth daily at 6 PM.       Disposition and follow-up:   Willie Jordan was discharged from [facility] in Stable condition.  At the hospital follow up visit please address:  1.  Please obtain renal function panel. Monitor compliance with diuretic regimen. Assess patient's weight and volume status. Assess blood pressure, could consider adding amlodipine if it is uncontrolled.  2.  Labs /  imaging needed at time of follow-up: Renal function panel  3.  Pending labs/ test needing follow-up: None  Follow-up Appointments: Follow-up Information    [facility]. Go on 03/17/2019.   Why: '@2' :10pm Contact information: [place] [id number] [phone]       Justin Mend, MD Follow up on 03/23/2019.   Specialty: Internal Medicine Why: 8:30AM Contact information: 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  Patient has biopsy-proven FSGS diabetic nephropathy.  He was diuresed aggressively and the maximal diuretic therapy he received was IV Lasix 120 mg 3 times daily plus metolazone 5 mg daily.  TTE performed on 03/09/2019 demonstrated EF 60-65% and elevated end-diastolic pressure. Electrolytes were closely monitored and repleted as necessary.  Patient had persistent right sided pleural effusion despite diuretic therapy.  Bedside thoracentesis was performed on 03/14/2019 removing 900 ml of pleural fluid.  Pleural fluid analysis was consistent with transudative effusion.  Patient's admission weight was 91.3 kg.  On day of discharge patient weight of 83.1 kg.  Patient has significant reduction in lower extremity edema and was able to sleep lying flat.  Patient experienced progressive worsening renal function during hospitalization.  On admission creatinine of 4.71 (estimated GFR 13).  Creatinine trended upwards and on day of discharge creatinine of 6.31 (estimated GFR of 9).  Patient was discharged on Lasix 120 mg 3 times daily.  # Hypertension: Patient's lisinopril was discontinued due to renal function.  Patient was started on hydralazine 50 mg 3 times daily which was increased to 75 mg 3 times daily.  As outpatient, if blood pressure continues to be poorly controlled could consider adding amlodipine.  # Diabetes Mellitus: As inpatient, blood sugar was managed with 12 units Lantus plus sliding scale insulin.  Patient was discharged on home diabetic regimen.  Discharge Vitals:   BP 139/73 (BP Location: Right Arm)   Pulse (!) 56   Temp 98.1 F (36.7 C) (Oral)   Resp 20   Ht '5\' 4"'  (1.626 m)   Wt 83.1 kg   SpO2 96%   BMI 31.43 kg/m   Pertinent Labs, Studies, and Procedures:  CBC Latest Ref Rng & Units 03/08/2019  03/07/2019 02/19/2019  WBC 4.0 - 10.5 K/uL 6.9 9.2 8.8  Hemoglobin 13.0 - 17.0 g/dL 9.3(L) 10.9(L) 11.0(L)  Hematocrit 39.0 - 52.0 % 28.7(L) 34.7(L) 32.9(L)  Platelets 150 - 400 K/uL 192 223 226   BMP Latest Ref Rng & Units 03/16/2019 03/15/2019 03/14/2019  Glucose 70 - 99 mg/dL 153(H) 134(H) 208(H)  BUN 6 - 20 mg/dL 52(H) 49(H) 47(H)  Creatinine 0.61 - 1.24 mg/dL 6.31(H) 6.54(H) 6.35(H)  BUN/Creat Ratio 9 - 20 - - -  Sodium 135 - 145 mmol/L 136 138 138  Potassium 3.5 - 5.1 mmol/L 4.4 4.1 4.4  Chloride 98 - 111 mmol/L 104 106 106  CO2 22 - 32 mmol/L '24 25 24  ' Calcium 8.9 - 10.3 mg/dL 7.9(L) 7.9(L) 7.8(L)   Pleural Fluid Analysis: LDH (Pleural fluid):       32 LDH (Serum):               109 Protein (Pleural fluid):   < 3.0  Protein (Serum):           5.4  CXR (8/31/20200): IMPRESSION: 1. Mild patchy left basilar opacity, which may reflect atelectasis and/or pneumonia in the appropriate clinical setting. 2. Trace bilateral pleural effusions.  CXR (03/12/2019): IMPRESSION: Right greater than left basilar airspace disease has an appearance most suggestive of pneumonia or atelectasis rather than pulmonary edema. Small right pleural effusion. Atherosclerosis. Cardiomegaly.  TTE (03/09/2019): IMPRESSION:  1. The left ventricle has normal systolic function with an ejection fraction of 60-65%. The cavity size was normal. Left ventricular diastolic Doppler parameters are consistent with pseudonormalization. Elevated left ventricular end-diastolic pressure.  2. The right ventricle has normal systolic function. The cavity was normal. There is no increase in right ventricular wall thickness. Right ventricular systolic pressure is normal.  3. The aortic valve is tricuspid. Moderate sclerosis of the aortic valve. Aortic valve regurgitation was not assessed by color flow Doppler.  4. The inferior vena cava was dilated in size with >50% respiratory variability.  5. Trivial pericardial effusion is  present.  6. The pericardial effusion is circumferential.  7. The aorta is normal unless otherwise noted.  Discharge Instructions: Discharge Instructions    (HEART FAILURE PATIENTS) Call MD:  Anytime you have any of the following symptoms: 1) 3 pound weight gain in 24 hours or 5 pounds in 1 week 2) shortness of breath, with or without a dry hacking cough 3) swelling in the hands, feet or stomach 4) if you have to sleep on extra pillows at night in order to breathe.   Complete by: As directed    Diet - low sodium heart healthy   Complete by: As directed    Discharge instructions   Complete by: As directed    Please continue to take you medications as directed. We have made several changes to your regimen. Please make certain to go over all of these with your nurse.   You will need to follow-up with your doctors in the next 1-2 weeks. Please call to make certain your appointments are scheduled.   Increase activity slowly   Complete by: As directed       Signed: Jeanmarie Hubert, MD 03/21/2019, 6:26 AM Pager: [phone]

## 2019-03-23 ENCOUNTER — Ambulatory Visit: Payer: Self-pay | Admitting: Gastroenterology

## 2019-03-31 ENCOUNTER — Ambulatory Visit: Payer: Self-pay | Admitting: Nurse Practitioner
